# Patient Record
Sex: Female | Born: 1935 | Race: Black or African American | Hispanic: No | Smoking: Former smoker
Health system: Southern US, Community
[De-identification: ages and names within clinical notes are randomized; demographics above are authoritative.]

## PROBLEM LIST (undated history)

## (undated) DIAGNOSIS — I10 Essential (primary) hypertension: Secondary | ICD-10-CM

## (undated) DIAGNOSIS — K219 Gastro-esophageal reflux disease without esophagitis: Secondary | ICD-10-CM

## (undated) DIAGNOSIS — D649 Anemia, unspecified: Secondary | ICD-10-CM

## (undated) DIAGNOSIS — G8929 Other chronic pain: Secondary | ICD-10-CM

## (undated) DIAGNOSIS — E079 Disorder of thyroid, unspecified: Secondary | ICD-10-CM

## (undated) DIAGNOSIS — R51 Headache: Secondary | ICD-10-CM

## (undated) DIAGNOSIS — I2510 Atherosclerotic heart disease of native coronary artery without angina pectoris: Secondary | ICD-10-CM

## (undated) DIAGNOSIS — M549 Dorsalgia, unspecified: Secondary | ICD-10-CM

## (undated) DIAGNOSIS — I809 Phlebitis and thrombophlebitis of unspecified site: Secondary | ICD-10-CM

## (undated) DIAGNOSIS — R0602 Shortness of breath: Secondary | ICD-10-CM

## (undated) DIAGNOSIS — M1990 Unspecified osteoarthritis, unspecified site: Secondary | ICD-10-CM

## (undated) DIAGNOSIS — G5602 Carpal tunnel syndrome, left upper limb: Secondary | ICD-10-CM

## (undated) DIAGNOSIS — E785 Hyperlipidemia, unspecified: Secondary | ICD-10-CM

## (undated) DIAGNOSIS — R197 Diarrhea, unspecified: Secondary | ICD-10-CM

## (undated) DIAGNOSIS — J189 Pneumonia, unspecified organism: Secondary | ICD-10-CM

## (undated) DIAGNOSIS — G4730 Sleep apnea, unspecified: Secondary | ICD-10-CM

## (undated) DIAGNOSIS — R351 Nocturia: Secondary | ICD-10-CM

## (undated) DIAGNOSIS — E669 Obesity, unspecified: Secondary | ICD-10-CM

## (undated) HISTORY — PX: COLONOSCOPY: SHX174

## (undated) HISTORY — PX: BACK SURGERY: SHX140

## (undated) HISTORY — DX: Atherosclerotic heart disease of native coronary artery without angina pectoris: I25.10

## (undated) HISTORY — PX: SPINE SURGERY: SHX786

## (undated) HISTORY — DX: Other chronic pain: M54.9

## (undated) HISTORY — DX: Hyperlipidemia, unspecified: E78.5

## (undated) HISTORY — DX: Carpal tunnel syndrome, left upper limb: G56.02

## (undated) HISTORY — PX: ABDOMINAL HYSTERECTOMY: SHX81

## (undated) HISTORY — DX: Other chronic pain: G89.29

## (undated) HISTORY — DX: Unspecified osteoarthritis, unspecified site: M19.90

## (undated) HISTORY — PX: CHOLECYSTECTOMY: SHX55

## (undated) HISTORY — PX: ESOPHAGOGASTRODUODENOSCOPY: SHX1529

## (undated) HISTORY — DX: Essential (primary) hypertension: I10

## (undated) HISTORY — DX: Obesity, unspecified: E66.9

## (undated) HISTORY — PX: ROTATOR CUFF REPAIR: SHX139

## (undated) HISTORY — PX: TONSILLECTOMY: SUR1361

---

## 1970-09-06 HISTORY — PX: PARTIAL HYSTERECTOMY: SHX80

## 1985-09-06 HISTORY — PX: APPENDECTOMY: SHX54

## 1990-09-06 HISTORY — PX: OTHER SURGICAL HISTORY: SHX169

## 1997-09-06 HISTORY — PX: OTHER SURGICAL HISTORY: SHX169

## 2003-08-19 ENCOUNTER — Ambulatory Visit (HOSPITAL_COMMUNITY): Admission: RE | Admit: 2003-08-19 | Discharge: 2003-08-19 | Payer: Self-pay | Admitting: Family Medicine

## 2003-08-22 ENCOUNTER — Ambulatory Visit (HOSPITAL_COMMUNITY): Admission: RE | Admit: 2003-08-22 | Discharge: 2003-08-22 | Payer: Self-pay | Admitting: Family Medicine

## 2003-09-03 ENCOUNTER — Ambulatory Visit (HOSPITAL_COMMUNITY): Admission: RE | Admit: 2003-09-03 | Discharge: 2003-09-03 | Payer: Self-pay | Admitting: Family Medicine

## 2003-09-27 ENCOUNTER — Ambulatory Visit (HOSPITAL_COMMUNITY): Admission: RE | Admit: 2003-09-27 | Discharge: 2003-09-27 | Payer: Self-pay | Admitting: General Surgery

## 2003-12-29 ENCOUNTER — Emergency Department (HOSPITAL_COMMUNITY): Admission: EM | Admit: 2003-12-29 | Discharge: 2003-12-29 | Payer: Self-pay | Admitting: Emergency Medicine

## 2004-04-07 ENCOUNTER — Ambulatory Visit (HOSPITAL_COMMUNITY): Admission: RE | Admit: 2004-04-07 | Discharge: 2004-04-07 | Payer: Self-pay | Admitting: Family Medicine

## 2004-05-20 ENCOUNTER — Encounter
Admission: RE | Admit: 2004-05-20 | Discharge: 2004-08-18 | Payer: Self-pay | Admitting: Physical Medicine & Rehabilitation

## 2004-05-21 ENCOUNTER — Ambulatory Visit: Payer: Self-pay | Admitting: Physical Medicine & Rehabilitation

## 2004-08-17 ENCOUNTER — Ambulatory Visit: Payer: Self-pay | Admitting: Physical Medicine & Rehabilitation

## 2004-08-21 ENCOUNTER — Ambulatory Visit (HOSPITAL_COMMUNITY): Admission: RE | Admit: 2004-08-21 | Discharge: 2004-08-21 | Payer: Self-pay | Admitting: Family Medicine

## 2004-11-03 ENCOUNTER — Ambulatory Visit: Payer: Self-pay | Admitting: Family Medicine

## 2005-01-21 ENCOUNTER — Ambulatory Visit: Payer: Self-pay | Admitting: Family Medicine

## 2005-01-28 ENCOUNTER — Ambulatory Visit: Payer: Self-pay | Admitting: Family Medicine

## 2005-06-08 ENCOUNTER — Ambulatory Visit: Payer: Self-pay | Admitting: Family Medicine

## 2005-08-23 ENCOUNTER — Ambulatory Visit (HOSPITAL_COMMUNITY): Admission: RE | Admit: 2005-08-23 | Discharge: 2005-08-23 | Payer: Self-pay | Admitting: Family Medicine

## 2005-10-13 ENCOUNTER — Ambulatory Visit: Payer: Self-pay | Admitting: Family Medicine

## 2005-11-01 ENCOUNTER — Encounter: Admission: RE | Admit: 2005-11-01 | Discharge: 2005-11-01 | Payer: Self-pay | Admitting: Family Medicine

## 2005-11-10 ENCOUNTER — Ambulatory Visit: Payer: Self-pay | Admitting: Family Medicine

## 2005-12-05 LAB — HM COLONOSCOPY: HM Colonoscopy: NORMAL

## 2005-12-21 ENCOUNTER — Ambulatory Visit (HOSPITAL_COMMUNITY): Admission: RE | Admit: 2005-12-21 | Discharge: 2005-12-21 | Payer: Self-pay | Admitting: General Surgery

## 2006-01-05 ENCOUNTER — Encounter (INDEPENDENT_AMBULATORY_CARE_PROVIDER_SITE_OTHER): Payer: Self-pay | Admitting: *Deleted

## 2006-01-05 ENCOUNTER — Encounter: Payer: Self-pay | Admitting: Family Medicine

## 2006-01-05 ENCOUNTER — Other Ambulatory Visit: Admission: RE | Admit: 2006-01-05 | Discharge: 2006-01-05 | Payer: Self-pay | Admitting: Family Medicine

## 2006-01-05 ENCOUNTER — Ambulatory Visit: Payer: Self-pay | Admitting: Family Medicine

## 2006-01-05 LAB — CONVERTED CEMR LAB: Pap Smear: NORMAL

## 2006-05-23 ENCOUNTER — Ambulatory Visit: Payer: Self-pay | Admitting: Family Medicine

## 2006-06-17 ENCOUNTER — Ambulatory Visit: Payer: Self-pay | Admitting: Family Medicine

## 2006-07-26 ENCOUNTER — Ambulatory Visit: Payer: Self-pay | Admitting: Family Medicine

## 2006-08-25 ENCOUNTER — Ambulatory Visit (HOSPITAL_COMMUNITY): Admission: RE | Admit: 2006-08-25 | Discharge: 2006-08-25 | Payer: Self-pay | Admitting: Family Medicine

## 2006-09-12 ENCOUNTER — Ambulatory Visit: Payer: Self-pay | Admitting: Family Medicine

## 2006-10-11 ENCOUNTER — Emergency Department (HOSPITAL_COMMUNITY): Admission: EM | Admit: 2006-10-11 | Discharge: 2006-10-11 | Payer: Self-pay | Admitting: Emergency Medicine

## 2006-11-07 ENCOUNTER — Ambulatory Visit (HOSPITAL_COMMUNITY): Admission: RE | Admit: 2006-11-07 | Discharge: 2006-11-07 | Payer: Self-pay | Admitting: *Deleted

## 2006-11-28 ENCOUNTER — Ambulatory Visit (HOSPITAL_COMMUNITY): Admission: RE | Admit: 2006-11-28 | Discharge: 2006-11-28 | Payer: Self-pay | Admitting: Family Medicine

## 2006-12-26 ENCOUNTER — Ambulatory Visit: Payer: Self-pay | Admitting: Family Medicine

## 2007-02-09 ENCOUNTER — Encounter: Admission: RE | Admit: 2007-02-09 | Discharge: 2007-02-09 | Payer: Self-pay | Admitting: Family Medicine

## 2007-02-14 ENCOUNTER — Ambulatory Visit (HOSPITAL_COMMUNITY): Admission: RE | Admit: 2007-02-14 | Discharge: 2007-02-14 | Payer: Self-pay | Admitting: *Deleted

## 2007-03-24 ENCOUNTER — Encounter: Payer: Self-pay | Admitting: Family Medicine

## 2007-03-24 LAB — CONVERTED CEMR LAB
ALT: 12 units/L (ref 0–35)
AST: 20 units/L (ref 0–37)
Albumin: 4.2 g/dL (ref 3.5–5.2)
Alkaline Phosphatase: 54 units/L (ref 39–117)
BUN: 28 mg/dL — ABNORMAL HIGH (ref 6–23)
Basophils Absolute: 0 10*3/uL (ref 0.0–0.1)
Basophils Relative: 1 % (ref 0–1)
Bilirubin, Direct: 0.1 mg/dL (ref 0.0–0.3)
CO2: 30 meq/L (ref 19–32)
Calcium: 9.4 mg/dL (ref 8.4–10.5)
Chloride: 104 meq/L (ref 96–112)
Cholesterol: 141 mg/dL (ref 0–200)
Creatinine, Ser: 1.17 mg/dL (ref 0.40–1.20)
Eosinophils Absolute: 0.2 10*3/uL (ref 0.0–0.7)
Eosinophils Relative: 5 % (ref 0–5)
Glucose, Bld: 88 mg/dL (ref 70–99)
HCT: 37.6 % (ref 36.0–46.0)
HDL: 45 mg/dL (ref >39)
Hemoglobin: 11.8 g/dL — ABNORMAL LOW (ref 12.0–15.0)
Indirect Bilirubin: 0.3 mg/dL (ref 0.0–0.9)
LDL Cholesterol: 77 mg/dL (ref 0–99)
Lymphocytes Relative: 48 % — ABNORMAL HIGH (ref 12–46)
Lymphs Abs: 2 10*3/uL (ref 0.7–3.3)
MCHC: 31.4 g/dL (ref 30.0–36.0)
MCV: 90.6 fL (ref 78.0–100.0)
Monocytes Absolute: 0.4 10*3/uL (ref 0.2–0.7)
Monocytes Relative: 10 % (ref 3–11)
Neutro Abs: 1.6 10*3/uL — ABNORMAL LOW (ref 1.7–7.7)
Neutrophils Relative %: 36 % — ABNORMAL LOW (ref 43–77)
Platelets: 261 10*3/uL (ref 150–400)
Potassium: 4.5 meq/L (ref 3.5–5.3)
RBC: 4.15 M/uL (ref 3.87–5.11)
RDW: 13.8 % (ref 11.5–14.0)
Sodium: 141 meq/L (ref 135–145)
Total Bilirubin: 0.4 mg/dL (ref 0.3–1.2)
Total CHOL/HDL Ratio: 3.1
Total Protein: 6.8 g/dL (ref 6.0–8.3)
Triglycerides: 96 mg/dL (ref <150)
VLDL: 19 mg/dL (ref 0–40)
WBC: 4.3 10*3/uL (ref 4.0–10.5)

## 2007-03-27 ENCOUNTER — Ambulatory Visit: Payer: Self-pay | Admitting: Family Medicine

## 2007-03-29 ENCOUNTER — Ambulatory Visit (HOSPITAL_COMMUNITY): Admission: RE | Admit: 2007-03-29 | Discharge: 2007-03-29 | Payer: Self-pay | Admitting: Family Medicine

## 2007-05-26 ENCOUNTER — Ambulatory Visit: Payer: Self-pay | Admitting: Family Medicine

## 2007-07-21 ENCOUNTER — Encounter: Admission: RE | Admit: 2007-07-21 | Discharge: 2007-07-21 | Payer: Self-pay | Admitting: Family Medicine

## 2007-07-21 ENCOUNTER — Ambulatory Visit: Payer: Self-pay | Admitting: Family Medicine

## 2007-08-19 ENCOUNTER — Encounter (INDEPENDENT_AMBULATORY_CARE_PROVIDER_SITE_OTHER): Payer: Self-pay | Admitting: *Deleted

## 2007-08-24 ENCOUNTER — Encounter: Payer: Self-pay | Admitting: Family Medicine

## 2007-08-24 LAB — CONVERTED CEMR LAB
ALT: 15 units/L (ref 0–35)
AST: 25 units/L (ref 0–37)
Albumin: 4.3 g/dL (ref 3.5–5.2)
Alkaline Phosphatase: 54 units/L (ref 39–117)
BUN: 26 mg/dL — ABNORMAL HIGH (ref 6–23)
Bilirubin, Direct: 0.1 mg/dL (ref 0.0–0.3)
CO2: 27 meq/L (ref 19–32)
Calcium: 9.9 mg/dL (ref 8.4–10.5)
Chloride: 101 meq/L (ref 96–112)
Cholesterol: 139 mg/dL (ref 0–200)
Creatinine, Ser: 0.96 mg/dL (ref 0.40–1.20)
Glucose, Bld: 88 mg/dL (ref 70–99)
HDL: 46 mg/dL (ref >39)
Indirect Bilirubin: 0.6 mg/dL (ref 0.0–0.9)
LDL Cholesterol: 68 mg/dL (ref 0–99)
Potassium: 4.3 meq/L (ref 3.5–5.3)
Sodium: 142 meq/L (ref 135–145)
Total Bilirubin: 0.7 mg/dL (ref 0.3–1.2)
Total CHOL/HDL Ratio: 3
Total Protein: 7.1 g/dL (ref 6.0–8.3)
Triglycerides: 127 mg/dL (ref <150)
VLDL: 25 mg/dL (ref 0–40)

## 2007-08-28 ENCOUNTER — Ambulatory Visit (HOSPITAL_COMMUNITY): Admission: RE | Admit: 2007-08-28 | Discharge: 2007-08-28 | Payer: Self-pay | Admitting: Family Medicine

## 2007-11-20 ENCOUNTER — Ambulatory Visit: Payer: Self-pay | Admitting: Family Medicine

## 2007-11-24 DIAGNOSIS — I10 Essential (primary) hypertension: Secondary | ICD-10-CM | POA: Insufficient documentation

## 2007-11-24 DIAGNOSIS — E785 Hyperlipidemia, unspecified: Secondary | ICD-10-CM | POA: Insufficient documentation

## 2007-12-01 ENCOUNTER — Encounter: Admission: RE | Admit: 2007-12-01 | Discharge: 2007-12-01 | Payer: Self-pay | Admitting: Family Medicine

## 2007-12-29 ENCOUNTER — Encounter: Payer: Self-pay | Admitting: Family Medicine

## 2007-12-29 LAB — CONVERTED CEMR LAB
ALT: 18 units/L (ref 0–35)
AST: 31 units/L (ref 0–37)
Albumin: 4.1 g/dL (ref 3.5–5.2)
Alkaline Phosphatase: 55 units/L (ref 39–117)
BUN: 25 mg/dL — ABNORMAL HIGH (ref 6–23)
Bilirubin, Direct: 0.1 mg/dL (ref 0.0–0.3)
CO2: 27 meq/L (ref 19–32)
Calcium: 9.7 mg/dL (ref 8.4–10.5)
Chloride: 103 meq/L (ref 96–112)
Cholesterol: 126 mg/dL (ref 0–200)
Creatinine, Ser: 1.21 mg/dL — ABNORMAL HIGH (ref 0.40–1.20)
Glucose, Bld: 85 mg/dL (ref 70–99)
HDL: 39 mg/dL — ABNORMAL LOW (ref >39)
Indirect Bilirubin: 0.3 mg/dL (ref 0.0–0.9)
LDL Cholesterol: 58 mg/dL (ref 0–99)
Potassium: 4.2 meq/L (ref 3.5–5.3)
Sodium: 141 meq/L (ref 135–145)
Total Bilirubin: 0.4 mg/dL (ref 0.3–1.2)
Total CHOL/HDL Ratio: 3.2
Total Protein: 7 g/dL (ref 6.0–8.3)
Triglycerides: 147 mg/dL (ref <150)
VLDL: 29 mg/dL (ref 0–40)

## 2008-01-04 ENCOUNTER — Encounter: Admission: RE | Admit: 2008-01-04 | Discharge: 2008-01-04 | Payer: Self-pay | Admitting: Family Medicine

## 2008-02-15 ENCOUNTER — Ambulatory Visit: Payer: Self-pay | Admitting: Family Medicine

## 2008-04-03 ENCOUNTER — Ambulatory Visit: Payer: Self-pay | Admitting: Family Medicine

## 2008-04-04 ENCOUNTER — Telehealth: Payer: Self-pay | Admitting: Family Medicine

## 2008-05-14 ENCOUNTER — Encounter: Payer: Self-pay | Admitting: Family Medicine

## 2008-05-23 ENCOUNTER — Encounter: Payer: Self-pay | Admitting: Family Medicine

## 2008-05-23 ENCOUNTER — Telehealth: Payer: Self-pay | Admitting: Family Medicine

## 2008-05-30 ENCOUNTER — Encounter: Admission: RE | Admit: 2008-05-30 | Discharge: 2008-05-30 | Payer: Self-pay | Admitting: Radiology

## 2008-06-24 ENCOUNTER — Encounter: Payer: Self-pay | Admitting: Family Medicine

## 2008-06-24 ENCOUNTER — Telehealth: Payer: Self-pay | Admitting: Family Medicine

## 2008-06-28 ENCOUNTER — Encounter: Payer: Self-pay | Admitting: Family Medicine

## 2008-07-01 ENCOUNTER — Encounter: Payer: Self-pay | Admitting: Family Medicine

## 2008-07-01 LAB — CONVERTED CEMR LAB
ALT: 19 units/L (ref 0–35)
AST: 27 units/L (ref 0–37)
Albumin: 4.1 g/dL (ref 3.5–5.2)
Alkaline Phosphatase: 61 units/L (ref 39–117)
BUN: 24 mg/dL — ABNORMAL HIGH (ref 6–23)
Basophils Absolute: 0 10*3/uL (ref 0.0–0.1)
Basophils Relative: 0 % (ref 0–1)
Bilirubin, Direct: 0.1 mg/dL (ref 0.0–0.3)
CO2: 25 meq/L (ref 19–32)
Calcium: 9.4 mg/dL (ref 8.4–10.5)
Chloride: 104 meq/L (ref 96–112)
Cholesterol: 132 mg/dL (ref 0–200)
Creatinine, Ser: 1.1 mg/dL (ref 0.40–1.20)
Eosinophils Absolute: 0.3 10*3/uL (ref 0.0–0.7)
Eosinophils Relative: 6 % — ABNORMAL HIGH (ref 0–5)
Glucose, Bld: 81 mg/dL (ref 70–99)
HCT: 35.6 % — ABNORMAL LOW (ref 36.0–46.0)
HDL: 57 mg/dL (ref >39)
Hemoglobin: 10.7 g/dL — ABNORMAL LOW (ref 12.0–15.0)
Indirect Bilirubin: 0.4 mg/dL (ref 0.0–0.9)
LDL Cholesterol: 62 mg/dL (ref 0–99)
Lymphocytes Relative: 53 % — ABNORMAL HIGH (ref 12–46)
Lymphs Abs: 2.5 10*3/uL (ref 0.7–4.0)
MCHC: 30.1 g/dL (ref 30.0–36.0)
MCV: 93 fL (ref 78.0–100.0)
Monocytes Absolute: 0.3 10*3/uL (ref 0.1–1.0)
Monocytes Relative: 7 % (ref 3–12)
Neutro Abs: 1.6 10*3/uL — ABNORMAL LOW (ref 1.7–7.7)
Neutrophils Relative %: 34 % — ABNORMAL LOW (ref 43–77)
Platelets: 225 10*3/uL (ref 150–400)
Potassium: 4.4 meq/L (ref 3.5–5.3)
RBC: 3.83 M/uL — ABNORMAL LOW (ref 3.87–5.11)
RDW: 14.1 % (ref 11.5–15.5)
Retic Ct Pct: 0.7 % (ref 0.4–3.1)
Sodium: 140 meq/L (ref 135–145)
Total Bilirubin: 0.5 mg/dL (ref 0.3–1.2)
Total CHOL/HDL Ratio: 2.3
Total Protein: 6.7 g/dL (ref 6.0–8.3)
Triglycerides: 63 mg/dL (ref <150)
VLDL: 13 mg/dL (ref 0–40)
WBC: 4.7 10*3/uL (ref 4.0–10.5)

## 2008-07-03 ENCOUNTER — Ambulatory Visit: Payer: Self-pay | Admitting: Family Medicine

## 2008-07-03 DIAGNOSIS — M5440 Lumbago with sciatica, unspecified side: Secondary | ICD-10-CM | POA: Insufficient documentation

## 2008-07-03 LAB — CONVERTED CEMR LAB
Bilirubin Urine: NEGATIVE
Glucose, Bld: 106 mg/dL
Glucose, Urine, Semiquant: NEGATIVE
Hgb A1c MFr Bld: 6.2 %
Ketones, urine, test strip: NEGATIVE
Nitrite: NEGATIVE
Protein, U semiquant: NEGATIVE
Specific Gravity, Urine: 1.015
Urobilinogen, UA: 0.2
WBC Urine, dipstick: NEGATIVE
pH: 5

## 2008-07-04 ENCOUNTER — Encounter: Payer: Self-pay | Admitting: Family Medicine

## 2008-07-04 LAB — CONVERTED CEMR LAB
Bilirubin Urine: NEGATIVE
Hemoglobin, Urine: NEGATIVE
Ketones, ur: NEGATIVE mg/dL
Leukocytes, UA: NEGATIVE
Nitrite: NEGATIVE
Protein, ur: NEGATIVE mg/dL
Specific Gravity, Urine: 1.02 (ref 1.005–1.03)
Urine Glucose: NEGATIVE mg/dL
Urobilinogen, UA: 0.2 (ref 0.0–1.0)
pH: 5.5 (ref 5.0–8.0)

## 2008-07-05 LAB — CONVERTED CEMR LAB: Helicobacter Pylori Antibody-IgG: >8 — ABNORMAL HIGH

## 2008-07-07 DIAGNOSIS — K3189 Other diseases of stomach and duodenum: Secondary | ICD-10-CM | POA: Insufficient documentation

## 2008-07-07 DIAGNOSIS — R1013 Epigastric pain: Secondary | ICD-10-CM

## 2008-07-24 ENCOUNTER — Telehealth: Payer: Self-pay | Admitting: Family Medicine

## 2008-07-25 ENCOUNTER — Encounter: Payer: Self-pay | Admitting: Family Medicine

## 2008-08-21 ENCOUNTER — Encounter: Payer: Self-pay | Admitting: Family Medicine

## 2008-08-22 ENCOUNTER — Encounter: Payer: Self-pay | Admitting: Family Medicine

## 2008-08-22 ENCOUNTER — Telehealth: Payer: Self-pay | Admitting: Family Medicine

## 2008-08-28 ENCOUNTER — Ambulatory Visit (HOSPITAL_COMMUNITY): Admission: RE | Admit: 2008-08-28 | Discharge: 2008-08-28 | Payer: Self-pay | Admitting: Family Medicine

## 2008-09-25 ENCOUNTER — Encounter: Payer: Self-pay | Admitting: Family Medicine

## 2008-09-27 LAB — CONVERTED CEMR LAB: Retic Ct Pct: 1.4 % (ref 0.4–3.1)

## 2008-10-01 ENCOUNTER — Ambulatory Visit: Payer: Self-pay | Admitting: Family Medicine

## 2008-10-01 DIAGNOSIS — I2510 Atherosclerotic heart disease of native coronary artery without angina pectoris: Secondary | ICD-10-CM | POA: Insufficient documentation

## 2008-10-02 ENCOUNTER — Telehealth: Payer: Self-pay | Admitting: Family Medicine

## 2008-10-03 ENCOUNTER — Encounter: Payer: Self-pay | Admitting: Family Medicine

## 2008-10-31 ENCOUNTER — Telehealth: Payer: Self-pay | Admitting: Family Medicine

## 2008-10-31 ENCOUNTER — Encounter: Payer: Self-pay | Admitting: Family Medicine

## 2008-11-04 ENCOUNTER — Encounter: Payer: Self-pay | Admitting: Family Medicine

## 2008-11-12 ENCOUNTER — Encounter: Payer: Self-pay | Admitting: Family Medicine

## 2008-11-12 HISTORY — PX: NM MYOCAR PERF WALL MOTION: HXRAD629

## 2008-12-18 ENCOUNTER — Encounter: Payer: Self-pay | Admitting: Family Medicine

## 2008-12-20 LAB — CONVERTED CEMR LAB
ALT: 13 units/L (ref 0–35)
AST: 22 units/L (ref 0–37)
Albumin: 4 g/dL (ref 3.5–5.2)
Alkaline Phosphatase: 52 units/L (ref 39–117)
BUN: 32 mg/dL — ABNORMAL HIGH (ref 6–23)
Basophils Absolute: 0 10*3/uL (ref 0.0–0.1)
Basophils Relative: 1 % (ref 0–1)
Bilirubin, Direct: 0.1 mg/dL (ref 0.0–0.3)
CO2: 27 meq/L (ref 19–32)
Calcium: 9.6 mg/dL (ref 8.4–10.5)
Chloride: 102 meq/L (ref 96–112)
Cholesterol: 133 mg/dL (ref 0–200)
Creatinine, Ser: 1.2 mg/dL (ref 0.40–1.20)
Eosinophils Absolute: 0.2 10*3/uL (ref 0.0–0.7)
Eosinophils Relative: 5 % (ref 0–5)
Glucose, Bld: 80 mg/dL (ref 70–99)
HCT: 35.2 % — ABNORMAL LOW (ref 36.0–46.0)
HDL: 41 mg/dL (ref >39)
Hemoglobin: 11.2 g/dL — ABNORMAL LOW (ref 12.0–15.0)
Indirect Bilirubin: 0.3 mg/dL (ref 0.0–0.9)
LDL Cholesterol: 65 mg/dL (ref 0–99)
Lymphocytes Relative: 52 % — ABNORMAL HIGH (ref 12–46)
Lymphs Abs: 2.3 10*3/uL (ref 0.7–4.0)
MCHC: 31.8 g/dL (ref 30.0–36.0)
MCV: 88.9 fL (ref 78.0–100.0)
Monocytes Absolute: 0.4 10*3/uL (ref 0.1–1.0)
Monocytes Relative: 8 % (ref 3–12)
Neutro Abs: 1.5 10*3/uL — ABNORMAL LOW (ref 1.7–7.7)
Neutrophils Relative %: 34 % — ABNORMAL LOW (ref 43–77)
Platelets: 225 10*3/uL (ref 150–400)
Potassium: 4.2 meq/L (ref 3.5–5.3)
RBC: 3.96 M/uL (ref 3.87–5.11)
RDW: 13.5 % (ref 11.5–15.5)
Sodium: 141 meq/L (ref 135–145)
Total Bilirubin: 0.4 mg/dL (ref 0.3–1.2)
Total CHOL/HDL Ratio: 3.2
Total Protein: 6.9 g/dL (ref 6.0–8.3)
Triglycerides: 136 mg/dL (ref <150)
VLDL: 27 mg/dL (ref 0–40)
WBC: 4.4 10*3/uL (ref 4.0–10.5)

## 2008-12-30 ENCOUNTER — Encounter: Payer: Self-pay | Admitting: Family Medicine

## 2008-12-30 ENCOUNTER — Telehealth: Payer: Self-pay | Admitting: Family Medicine

## 2008-12-31 ENCOUNTER — Encounter: Payer: Self-pay | Admitting: Family Medicine

## 2009-02-06 ENCOUNTER — Encounter: Payer: Self-pay | Admitting: Family Medicine

## 2009-02-06 ENCOUNTER — Telehealth: Payer: Self-pay | Admitting: Family Medicine

## 2009-03-19 ENCOUNTER — Telehealth: Payer: Self-pay | Admitting: Family Medicine

## 2009-03-31 ENCOUNTER — Encounter: Admission: RE | Admit: 2009-03-31 | Discharge: 2009-03-31 | Payer: Self-pay | Admitting: Family Medicine

## 2009-03-31 ENCOUNTER — Telehealth: Payer: Self-pay | Admitting: Family Medicine

## 2009-04-30 ENCOUNTER — Telehealth: Payer: Self-pay | Admitting: Family Medicine

## 2009-05-01 ENCOUNTER — Encounter: Payer: Self-pay | Admitting: Family Medicine

## 2009-05-02 ENCOUNTER — Encounter: Payer: Self-pay | Admitting: Family Medicine

## 2009-05-28 ENCOUNTER — Encounter: Payer: Self-pay | Admitting: Family Medicine

## 2009-05-29 ENCOUNTER — Telehealth: Payer: Self-pay | Admitting: Family Medicine

## 2009-05-29 DIAGNOSIS — R5381 Other malaise: Secondary | ICD-10-CM | POA: Insufficient documentation

## 2009-05-29 DIAGNOSIS — R5383 Other fatigue: Secondary | ICD-10-CM

## 2009-06-09 LAB — CONVERTED CEMR LAB: Retic Ct Pct: 1.3 % (ref 0.4–3.1)

## 2009-06-12 ENCOUNTER — Ambulatory Visit: Payer: Self-pay | Admitting: Family Medicine

## 2009-06-17 LAB — CONVERTED CEMR LAB: OCCULT 1: NEGATIVE

## 2009-06-18 LAB — CONVERTED CEMR LAB: OCCULT 3: NEGATIVE

## 2009-06-19 LAB — CONVERTED CEMR LAB: OCCULT 2: NEGATIVE

## 2009-06-23 ENCOUNTER — Encounter: Payer: Self-pay | Admitting: Family Medicine

## 2009-06-25 ENCOUNTER — Encounter: Payer: Self-pay | Admitting: Family Medicine

## 2009-06-25 ENCOUNTER — Telehealth: Payer: Self-pay | Admitting: Family Medicine

## 2009-06-27 ENCOUNTER — Encounter: Payer: Self-pay | Admitting: Family Medicine

## 2009-06-27 ENCOUNTER — Encounter (INDEPENDENT_AMBULATORY_CARE_PROVIDER_SITE_OTHER): Payer: Self-pay | Admitting: *Deleted

## 2009-06-27 LAB — CONVERTED CEMR LAB: Helicobacter Pylori Antibody-IgG: 2.6 — ABNORMAL HIGH

## 2009-07-03 ENCOUNTER — Telehealth: Payer: Self-pay | Admitting: Family Medicine

## 2009-07-07 ENCOUNTER — Ambulatory Visit: Payer: Self-pay | Admitting: Family Medicine

## 2009-07-07 LAB — CONVERTED CEMR LAB: OCCULT 1: NEGATIVE

## 2009-07-09 ENCOUNTER — Ambulatory Visit: Payer: Self-pay | Admitting: Internal Medicine

## 2009-07-10 ENCOUNTER — Encounter: Payer: Self-pay | Admitting: Internal Medicine

## 2009-07-14 ENCOUNTER — Ambulatory Visit (HOSPITAL_COMMUNITY): Admission: RE | Admit: 2009-07-14 | Discharge: 2009-07-14 | Payer: Self-pay | Admitting: Internal Medicine

## 2009-07-14 ENCOUNTER — Ambulatory Visit: Payer: Self-pay | Admitting: Internal Medicine

## 2009-07-14 ENCOUNTER — Encounter: Payer: Self-pay | Admitting: Family Medicine

## 2009-07-14 ENCOUNTER — Encounter: Payer: Self-pay | Admitting: Gastroenterology

## 2009-07-17 ENCOUNTER — Ambulatory Visit (HOSPITAL_COMMUNITY): Admission: RE | Admit: 2009-07-17 | Discharge: 2009-07-17 | Payer: Self-pay | Admitting: Internal Medicine

## 2009-08-04 ENCOUNTER — Encounter: Payer: Self-pay | Admitting: Family Medicine

## 2009-08-08 LAB — CONVERTED CEMR LAB: Retic Ct Pct: 1.4 % (ref 0.4–3.1)

## 2009-08-12 ENCOUNTER — Encounter: Payer: Self-pay | Admitting: Family Medicine

## 2009-09-09 ENCOUNTER — Ambulatory Visit (HOSPITAL_COMMUNITY): Admission: RE | Admit: 2009-09-09 | Discharge: 2009-09-09 | Payer: Self-pay | Admitting: Family Medicine

## 2009-09-10 ENCOUNTER — Telehealth: Payer: Self-pay | Admitting: Family Medicine

## 2009-09-10 ENCOUNTER — Ambulatory Visit: Payer: Self-pay | Admitting: Internal Medicine

## 2009-09-15 ENCOUNTER — Encounter: Payer: Self-pay | Admitting: Family Medicine

## 2009-09-15 ENCOUNTER — Ambulatory Visit: Payer: Self-pay | Admitting: Internal Medicine

## 2009-09-15 ENCOUNTER — Telehealth: Payer: Self-pay | Admitting: Family Medicine

## 2009-09-15 DIAGNOSIS — K219 Gastro-esophageal reflux disease without esophagitis: Secondary | ICD-10-CM | POA: Insufficient documentation

## 2009-09-18 ENCOUNTER — Encounter: Payer: Self-pay | Admitting: Internal Medicine

## 2009-09-19 LAB — CONVERTED CEMR LAB
BUN: 27 mg/dL — ABNORMAL HIGH (ref 6–23)
CO2: 25 meq/L (ref 19–32)
Calcium: 9 mg/dL (ref 8.4–10.5)
Chloride: 99 meq/L (ref 96–112)
Creatinine, Ser: 1.15 mg/dL (ref 0.40–1.20)
Glucose, Bld: 84 mg/dL (ref 70–99)
Potassium: 4 meq/L (ref 3.5–5.3)
Sodium: 140 meq/L (ref 135–145)
Vit D, 25-Hydroxy: 57 ng/mL (ref 30–89)

## 2009-09-22 ENCOUNTER — Telehealth: Payer: Self-pay | Admitting: Family Medicine

## 2009-10-13 ENCOUNTER — Encounter: Payer: Self-pay | Admitting: Internal Medicine

## 2009-10-15 ENCOUNTER — Encounter: Payer: Self-pay | Admitting: Family Medicine

## 2009-10-16 ENCOUNTER — Encounter: Payer: Self-pay | Admitting: Family Medicine

## 2009-11-20 ENCOUNTER — Encounter: Admission: RE | Admit: 2009-11-20 | Discharge: 2009-11-20 | Payer: Self-pay | Admitting: Family Medicine

## 2009-12-01 ENCOUNTER — Ambulatory Visit: Payer: Self-pay | Admitting: Family Medicine

## 2009-12-01 LAB — CONVERTED CEMR LAB
ALT: 19 units/L (ref 0–35)
AST: 29 units/L (ref 0–37)
Albumin: 4.4 g/dL (ref 3.5–5.2)
Alkaline Phosphatase: 50 units/L (ref 39–117)
BUN: 33 mg/dL — ABNORMAL HIGH (ref 6–23)
Basophils Absolute: 0 10*3/uL (ref 0.0–0.1)
Basophils Relative: 0 % (ref 0–1)
Bilirubin, Direct: 0.1 mg/dL (ref 0.0–0.3)
CO2: 26 meq/L (ref 19–32)
Calcium: 10.1 mg/dL (ref 8.4–10.5)
Chloride: 101 meq/L (ref 96–112)
Cholesterol: 144 mg/dL (ref 0–200)
Creatinine, Ser: 1.26 mg/dL — ABNORMAL HIGH (ref 0.40–1.20)
Eosinophils Absolute: 0.1 10*3/uL (ref 0.0–0.7)
Eosinophils Relative: 3 % (ref 0–5)
Glucose, Bld: 95 mg/dL (ref 70–99)
HCT: 34.8 % — ABNORMAL LOW (ref 36.0–46.0)
HDL: 53 mg/dL (ref >39)
Hemoglobin: 10.8 g/dL — ABNORMAL LOW (ref 12.0–15.0)
Indirect Bilirubin: 0.3 mg/dL (ref 0.0–0.9)
LDL Cholesterol: 74 mg/dL (ref 0–99)
Lymphocytes Relative: 50 % — ABNORMAL HIGH (ref 12–46)
Lymphs Abs: 2.6 10*3/uL (ref 0.7–4.0)
MCHC: 31 g/dL (ref 30.0–36.0)
MCV: 89.5 fL (ref 78.0–100.0)
Monocytes Absolute: 0.5 10*3/uL (ref 0.1–1.0)
Monocytes Relative: 9 % (ref 3–12)
Neutro Abs: 2 10*3/uL (ref 1.7–7.7)
Neutrophils Relative %: 38 % — ABNORMAL LOW (ref 43–77)
Platelets: 257 10*3/uL (ref 150–400)
Potassium: 4 meq/L (ref 3.5–5.3)
RBC: 3.89 M/uL (ref 3.87–5.11)
RDW: 13.8 % (ref 11.5–15.5)
Sodium: 141 meq/L (ref 135–145)
Total Bilirubin: 0.4 mg/dL (ref 0.3–1.2)
Total CHOL/HDL Ratio: 2.7
Total Protein: 7 g/dL (ref 6.0–8.3)
Triglycerides: 83 mg/dL (ref <150)
VLDL: 17 mg/dL (ref 0–40)
WBC: 5.2 10*3/uL (ref 4.0–10.5)

## 2009-12-25 ENCOUNTER — Encounter: Payer: Self-pay | Admitting: Family Medicine

## 2010-01-09 ENCOUNTER — Telehealth: Payer: Self-pay | Admitting: Family Medicine

## 2010-01-27 ENCOUNTER — Encounter: Payer: Self-pay | Admitting: Family Medicine

## 2010-02-17 LAB — CONVERTED CEMR LAB
BUN: 33 mg/dL — ABNORMAL HIGH (ref 6–23)
CO2: 27 meq/L (ref 19–32)
Calcium: 10.7 mg/dL — ABNORMAL HIGH (ref 8.4–10.5)
Chloride: 98 meq/L (ref 96–112)
Creatinine, Ser: 1.21 mg/dL — ABNORMAL HIGH (ref 0.40–1.20)
Glucose, Bld: 82 mg/dL (ref 70–99)
Potassium: 4.3 meq/L (ref 3.5–5.3)
Sodium: 136 meq/L (ref 135–145)

## 2010-02-19 ENCOUNTER — Other Ambulatory Visit: Admission: RE | Admit: 2010-02-19 | Discharge: 2010-02-19 | Payer: Self-pay | Admitting: Family Medicine

## 2010-02-19 ENCOUNTER — Ambulatory Visit: Payer: Self-pay | Admitting: Family Medicine

## 2010-02-19 DIAGNOSIS — D509 Iron deficiency anemia, unspecified: Secondary | ICD-10-CM | POA: Insufficient documentation

## 2010-02-19 DIAGNOSIS — G5600 Carpal tunnel syndrome, unspecified upper limb: Secondary | ICD-10-CM | POA: Insufficient documentation

## 2010-02-19 LAB — CONVERTED CEMR LAB: OCCULT 1: NEGATIVE

## 2010-02-23 ENCOUNTER — Telehealth: Payer: Self-pay | Admitting: Family Medicine

## 2010-02-23 LAB — CONVERTED CEMR LAB
Basophils Absolute: 0 10*3/uL (ref 0.0–0.1)
Basophils Relative: 0 % (ref 0–1)
Eosinophils Absolute: 0.2 10*3/uL (ref 0.0–0.7)
Eosinophils Relative: 5 % (ref 0–5)
HCT: 36.1 % (ref 36.0–46.0)
Hemoglobin: 11.3 g/dL — ABNORMAL LOW (ref 12.0–15.0)
Iron: 96 ug/dL (ref 42–145)
Lymphocytes Relative: 46 % (ref 12–46)
Lymphs Abs: 2.3 10*3/uL (ref 0.7–4.0)
MCHC: 31.3 g/dL (ref 30.0–36.0)
MCV: 91.6 fL (ref 78.0–100.0)
Monocytes Absolute: 0.4 10*3/uL (ref 0.1–1.0)
Monocytes Relative: 9 % (ref 3–12)
Neutro Abs: 2 10*3/uL (ref 1.7–7.7)
Neutrophils Relative %: 40 % — ABNORMAL LOW (ref 43–77)
Platelets: 230 10*3/uL (ref 150–400)
RBC: 3.94 M/uL (ref 3.87–5.11)
RDW: 13.2 % (ref 11.5–15.5)
WBC: 4.9 10*3/uL (ref 4.0–10.5)

## 2010-02-24 ENCOUNTER — Encounter: Payer: Self-pay | Admitting: Family Medicine

## 2010-02-24 LAB — CONVERTED CEMR LAB: Pap Smear: NEGATIVE

## 2010-03-23 ENCOUNTER — Encounter: Admission: RE | Admit: 2010-03-23 | Discharge: 2010-03-23 | Payer: Self-pay | Admitting: Family Medicine

## 2010-04-02 ENCOUNTER — Encounter: Payer: Self-pay | Admitting: Family Medicine

## 2010-04-28 ENCOUNTER — Telehealth: Payer: Self-pay | Admitting: Family Medicine

## 2010-04-29 ENCOUNTER — Encounter: Payer: Self-pay | Admitting: Family Medicine

## 2010-05-07 HISTORY — PX: CARDIAC CATHETERIZATION: SHX172

## 2010-05-22 ENCOUNTER — Telehealth: Payer: Self-pay | Admitting: Family Medicine

## 2010-06-05 ENCOUNTER — Inpatient Hospital Stay (HOSPITAL_COMMUNITY): Admission: EM | Admit: 2010-06-05 | Discharge: 2010-06-17 | Payer: Self-pay | Admitting: Emergency Medicine

## 2010-06-08 ENCOUNTER — Encounter (INDEPENDENT_AMBULATORY_CARE_PROVIDER_SITE_OTHER): Payer: Self-pay | Admitting: Internal Medicine

## 2010-06-15 ENCOUNTER — Ambulatory Visit: Payer: Self-pay | Admitting: Vascular Surgery

## 2010-06-15 ENCOUNTER — Encounter (INDEPENDENT_AMBULATORY_CARE_PROVIDER_SITE_OTHER): Payer: Self-pay | Admitting: Cardiology

## 2010-06-15 ENCOUNTER — Encounter: Payer: Self-pay | Admitting: Internal Medicine

## 2010-06-25 ENCOUNTER — Ambulatory Visit: Payer: Self-pay | Admitting: Family Medicine

## 2010-06-25 ENCOUNTER — Ambulatory Visit (HOSPITAL_COMMUNITY): Admission: RE | Admit: 2010-06-25 | Discharge: 2010-06-25 | Payer: Self-pay | Admitting: Family Medicine

## 2010-06-25 LAB — CONVERTED CEMR LAB
ALT: 21 units/L (ref 0–35)
AST: 35 units/L (ref 0–37)
Albumin: 4 g/dL (ref 3.5–5.2)
Alkaline Phosphatase: 54 units/L (ref 39–117)
BUN: 24 mg/dL — ABNORMAL HIGH (ref 6–23)
Bilirubin, Direct: 0.2 mg/dL (ref 0.0–0.3)
CO2: 23 meq/L (ref 19–32)
Calcium: 9.4 mg/dL (ref 8.4–10.5)
Chloride: 108 meq/L (ref 96–112)
Cholesterol: 173 mg/dL (ref 0–200)
Creatinine, Ser: 1.09 mg/dL (ref 0.40–1.20)
Glucose, Bld: 95 mg/dL (ref 70–99)
HDL: 46 mg/dL (ref >39)
Indirect Bilirubin: 0.7 mg/dL (ref 0.0–0.9)
LDL Cholesterol: 97 mg/dL (ref 0–99)
Potassium: 4.3 meq/L (ref 3.5–5.3)
Sodium: 143 meq/L (ref 135–145)
Total Bilirubin: 0.9 mg/dL (ref 0.3–1.2)
Total CHOL/HDL Ratio: 3.8
Total Protein: 6.4 g/dL (ref 6.0–8.3)
Triglycerides: 152 mg/dL — ABNORMAL HIGH (ref <150)
VLDL: 30 mg/dL (ref 0–40)

## 2010-06-26 ENCOUNTER — Telehealth: Payer: Self-pay | Admitting: Family Medicine

## 2010-06-29 ENCOUNTER — Encounter: Payer: Self-pay | Admitting: Family Medicine

## 2010-07-02 ENCOUNTER — Ambulatory Visit: Payer: Self-pay | Admitting: Family Medicine

## 2010-07-03 ENCOUNTER — Encounter: Payer: Self-pay | Admitting: Family Medicine

## 2010-07-04 ENCOUNTER — Encounter: Payer: Self-pay | Admitting: Family Medicine

## 2010-07-05 ENCOUNTER — Encounter: Payer: Self-pay | Admitting: Family Medicine

## 2010-07-08 ENCOUNTER — Encounter: Payer: Self-pay | Admitting: Family Medicine

## 2010-08-25 ENCOUNTER — Ambulatory Visit: Payer: Self-pay | Admitting: Family Medicine

## 2010-08-26 ENCOUNTER — Encounter: Payer: Self-pay | Admitting: Family Medicine

## 2010-09-03 LAB — HM COLONOSCOPY

## 2010-09-03 LAB — HM MAMMOGRAPHY

## 2010-09-03 LAB — HM PAP SMEAR

## 2010-09-06 DIAGNOSIS — F3289 Other specified depressive episodes: Secondary | ICD-10-CM | POA: Insufficient documentation

## 2010-09-06 DIAGNOSIS — F329 Major depressive disorder, single episode, unspecified: Secondary | ICD-10-CM | POA: Insufficient documentation

## 2010-09-10 ENCOUNTER — Emergency Department (HOSPITAL_COMMUNITY)
Admission: EM | Admit: 2010-09-10 | Discharge: 2010-09-10 | Payer: Self-pay | Source: Home / Self Care | Admitting: Emergency Medicine

## 2010-09-10 ENCOUNTER — Telehealth: Payer: Self-pay | Admitting: Family Medicine

## 2010-09-10 ENCOUNTER — Inpatient Hospital Stay (HOSPITAL_COMMUNITY): Admission: EM | Admit: 2010-09-10 | Discharge: 2010-09-12 | Payer: Self-pay | Source: Home / Self Care

## 2010-09-10 LAB — POCT CARDIAC MARKERS
CKMB, poc: 1.2 ng/mL (ref 1.0–8.0)
CKMB, poc: 1.9 ng/mL (ref 1.0–8.0)
CKMB, poc: 1.4 ng/mL (ref 1.0–8.0)
Myoglobin, poc: 74.5 ng/mL (ref 12–200)
Myoglobin, poc: 102 ng/mL (ref 12–200)
Myoglobin, poc: 68.9 ng/mL (ref 12–200)
Troponin i, poc: <0.05 ng/mL (ref 0.00–0.09)
Troponin i, poc: <0.05 ng/mL (ref 0.00–0.09)
Troponin i, poc: <0.05 ng/mL (ref 0.00–0.09)

## 2010-09-10 LAB — CBC
HCT: 36.4 % (ref 36.0–46.0)
Hemoglobin: 11.4 g/dL — ABNORMAL LOW (ref 12.0–15.0)
MCH: 27.2 pg (ref 26.0–34.0)
MCHC: 31.3 g/dL (ref 30.0–36.0)
MCV: 86.9 fL (ref 78.0–100.0)
Platelets: 254 10*3/uL (ref 150–400)
RBC: 4.19 MIL/uL (ref 3.87–5.11)
RDW: 13.8 % (ref 11.5–15.5)
WBC: 6.2 10*3/uL (ref 4.0–10.5)

## 2010-09-10 LAB — DIFFERENTIAL
Basophils Absolute: 0 10*3/uL (ref 0.0–0.1)
Basophils Relative: 0 % (ref 0–1)
Eosinophils Absolute: 0.1 10*3/uL (ref 0.0–0.7)
Eosinophils Relative: 2 % (ref 0–5)
Lymphocytes Relative: 33 % (ref 12–46)
Lymphs Abs: 2 10*3/uL (ref 0.7–4.0)
Monocytes Absolute: 0.4 10*3/uL (ref 0.1–1.0)
Monocytes Relative: 7 % (ref 3–12)
Neutro Abs: 3.6 10*3/uL (ref 1.7–7.7)
Neutrophils Relative %: 59 % (ref 43–77)

## 2010-09-10 LAB — BASIC METABOLIC PANEL
BUN: 15 mg/dL (ref 6–23)
CO2: 30 mEq/L (ref 19–32)
Calcium: 10.2 mg/dL (ref 8.4–10.5)
Chloride: 103 mEq/L (ref 96–112)
Creatinine, Ser: 1.22 mg/dL — ABNORMAL HIGH (ref 0.4–1.2)
GFR calc Af Amer: 52 mL/min — ABNORMAL LOW (ref >60)
GFR calc non Af Amer: 43 mL/min — ABNORMAL LOW (ref >60)
Glucose, Bld: 102 mg/dL — ABNORMAL HIGH (ref 70–99)
Potassium: 3.8 mEq/L (ref 3.5–5.1)
Sodium: 141 mEq/L (ref 135–145)

## 2010-09-11 LAB — CBC
HCT: 33.8 % — ABNORMAL LOW (ref 36.0–46.0)
Hemoglobin: 11.2 g/dL — ABNORMAL LOW (ref 12.0–15.0)
MCH: 28.8 pg (ref 26.0–34.0)
MCHC: 33.1 g/dL (ref 30.0–36.0)
MCV: 86.9 fL (ref 78.0–100.0)
Platelets: 214 10*3/uL (ref 150–400)
RBC: 3.89 MIL/uL (ref 3.87–5.11)
RDW: 13.9 % (ref 11.5–15.5)
WBC: 9.2 10*3/uL (ref 4.0–10.5)

## 2010-09-11 LAB — COMPREHENSIVE METABOLIC PANEL
ALT: 13 U/L (ref 0–35)
AST: 19 U/L (ref 0–37)
Albumin: 3.2 g/dL — ABNORMAL LOW (ref 3.5–5.2)
Alkaline Phosphatase: 50 U/L (ref 39–117)
BUN: 14 mg/dL (ref 6–23)
CO2: 28 mEq/L (ref 19–32)
Calcium: 9.1 mg/dL (ref 8.4–10.5)
Chloride: 105 mEq/L (ref 96–112)
Creatinine, Ser: 1.03 mg/dL (ref 0.4–1.2)
GFR calc Af Amer: >60 mL/min (ref >60)
GFR calc non Af Amer: 52 mL/min — ABNORMAL LOW (ref >60)
Glucose, Bld: 116 mg/dL — ABNORMAL HIGH (ref 70–99)
Potassium: 3.9 mEq/L (ref 3.5–5.1)
Sodium: 139 mEq/L (ref 135–145)
Total Bilirubin: 0.5 mg/dL (ref 0.3–1.2)
Total Protein: 6.2 g/dL (ref 6.0–8.3)

## 2010-09-11 LAB — DIFFERENTIAL
Basophils Absolute: 0 10*3/uL (ref 0.0–0.1)
Basophils Relative: 0 % (ref 0–1)
Eosinophils Absolute: 0.1 10*3/uL (ref 0.0–0.7)
Eosinophils Relative: 1 % (ref 0–5)
Lymphocytes Relative: 20 % (ref 12–46)
Lymphs Abs: 1.8 10*3/uL (ref 0.7–4.0)
Monocytes Absolute: 0.8 10*3/uL (ref 0.1–1.0)
Monocytes Relative: 9 % (ref 3–12)
Neutro Abs: 6.5 10*3/uL (ref 1.7–7.7)
Neutrophils Relative %: 70 % (ref 43–77)

## 2010-09-11 LAB — CARDIAC PANEL(CRET KIN+CKTOT+MB+TROPI)
CK, MB: 1.1 ng/mL (ref 0.3–4.0)
CK, MB: 0.6 ng/mL (ref 0.3–4.0)
Relative Index: INVALID (ref 0.0–2.5)
Relative Index: INVALID (ref 0.0–2.5)
Total CK: 45 U/L (ref 7–177)
Total CK: 42 U/L (ref 7–177)
Troponin I: 0.01 ng/mL (ref 0.00–0.06)
Troponin I: 0.01 ng/mL (ref 0.00–0.06)

## 2010-09-11 LAB — MAGNESIUM: Magnesium: 1.6 mg/dL (ref 1.5–2.5)

## 2010-09-24 ENCOUNTER — Ambulatory Visit
Admission: RE | Admit: 2010-09-24 | Discharge: 2010-09-24 | Payer: Self-pay | Source: Home / Self Care | Attending: Family Medicine | Admitting: Family Medicine

## 2010-09-25 LAB — CONVERTED CEMR LAB
BUN: 16 mg/dL (ref 6–23)
CO2: 29 meq/L (ref 19–32)
Calcium: 9.9 mg/dL (ref 8.4–10.5)
Chloride: 106 meq/L (ref 96–112)
Creatinine, Ser: 0.88 mg/dL (ref 0.40–1.20)
Glucose, Bld: 80 mg/dL (ref 70–99)
Hgb A1c MFr Bld: 6 % — ABNORMAL HIGH (ref <5.7)
Potassium: 4.1 meq/L (ref 3.5–5.3)
Sodium: 142 meq/L (ref 135–145)
TSH: 2.27 microintl units/mL (ref 0.350–4.500)

## 2010-09-26 ENCOUNTER — Encounter: Payer: Self-pay | Admitting: Family Medicine

## 2010-09-27 ENCOUNTER — Encounter: Payer: Self-pay | Admitting: Family Medicine

## 2010-09-28 ENCOUNTER — Ambulatory Visit: Admit: 2010-09-28 | Payer: Self-pay | Admitting: Family Medicine

## 2010-09-29 ENCOUNTER — Telehealth: Payer: Self-pay | Admitting: Family Medicine

## 2010-10-01 ENCOUNTER — Ambulatory Visit (HOSPITAL_COMMUNITY)
Admission: RE | Admit: 2010-10-01 | Discharge: 2010-10-01 | Payer: Self-pay | Source: Home / Self Care | Attending: Family Medicine | Admitting: Family Medicine

## 2010-10-01 NOTE — Discharge Summary (Addendum)
[name], [name]            ACCOUNT NO.:  [id number]  MEDICAL RECORD NO.:  [id number]          PATIENT TYPE:  INP  LOCATION:  A319                          FACILITY:  [facility]  PHYSICIAN:  Kathlen Mody, MD       DATE OF BIRTH:  November 14, 1933  DATE OF ADMISSION:  09/10/2010 DATE OF DISCHARGE:  09/12/2010                              DISCHARGE SUMMARY   PRIMARY CARE PHYSICIAN:  Dr. Dorothe Pea.  DISCHARGE DIAGNOSIS:  Chest pain, most likely secondary to her gastroesophageal reflux disease.  OTHER DIAGNOSES:  Non-ST-elevated myocardial infarction, coronary artery disease, hyperlipidemia, angioedema secondary to ACE inhibitor, pneumonia, cholecystectomy, appendectomy, hysterectomy.         DISCHARGE MEDICATIONS: 1. Skelaxin 800 mg p.o. t.i.d. 2. Protonix 40 mg p.o. daily. 3. Vitamin B6. 4. Metoprolol 25 mg p.o. b.i.d. 5. EpiPen. 6. Vitamin C. 7. Multivitamin. 8. Aspirin 81 mg daily. 9. Lipitor 20 mg at bedtime once a day as needed.  PERTINENT LABORATORY DATA:  The patient had a CBC done which was pretty much normal except for hemoglobin of 11.4.  Point-of care cardiac markers were negative.  BMP showed sodium of 141, potassium of 3.8, chloride of 103, bicarb of 30, glucose of 102, BUN of 15, creatinine of 1.22, calcium of 10.2.  Second cardiac enzymes negative.  Third set of cardiac enzymes negative.  There was a CK-MB done which showed 1.1, troponin 0.01.  Another set of cardiac enzymes done in January 2012 was also negative.  EKG revealed sinus rhythm.  An echocardiogram was done June 08, 2010, showed good systolic function, ejection fraction of 55% to 60%, no regional wall  abnormalities, diastolic function of the left ventricle was also normal, right ventricular systolic pressures were also slightly increased.  RADIOLOGY:  The patient had a chest x-ray, which did not show any acute abnormality.  Another PA and lateral x-ray was done the next day, which showed  subsegmental atelectasis in the left lung base with an otherwise stable cardiopulmonary appearance.  CT of head without contrast was done, which did not show any acute intracranial hemorrhage.  CT evidence of large acute infarct, mild partial opacification of left maxillary sinus.  BRIEF HOSPITAL COURSE:  This is a 77 year old lady with past medical history of non-ST-elevation MI, status post cardiac catheterization in October 2011, came in for chest pain.  The patient said that she heard the news that her father has expired, few minutes before the onset of the chest pain.  The chest pain was nonexertional.  Acute coronary syndrome was ruled out with negative enzymes and normal EKG with a recent cardiac catheterization; however, patient was admitted and observed overnight.  She was given an antianxiety medication, and acute coronary syndrome was ruled out.  It was probably most likely secondary to anxiety versus acid reflux.  On the day of discharge, the patient's vitals were, she had a temperature of 97.5, pulse of 65 per minute, respirations of 20 per minute, saturating 96% on room air, blood pressure was 131/76.  She was alert, afebrile, oriented x3.  She is comfortable, sitting on the chair, and surrounded by family members.  Cardiovascular, S1 and S2 heard.  No rubs, no gallops.  Regular rate and rhythm.  Respiratory exam, good air entry bilaterally, no adventitious sounds.  Abdomen is soft, nontender, nondistended.  Extremities:  No pedal edema.  The patient was hemodynamically stable, and her chest pain was thought to be secondary to anxiety and stress from hearing the news of the demise of her father. The patient's family members were at bedside and requested that she be discharged with no procedures were to be done.  DISCHARGE INSTRUCTIONS:  The patient was asked to follow with her cardiologist and her primary care physician in about 1-2 weeks.  DISCHARGE MEDICATIONS:  She  was discharged home on Protonix 40 mg daily.  Time spent with the patient was 40 minutes.          ______________________________ Kathlen Mody, MD     [facility]  D:  09/29/2010  T:  09/29/2010  Job:  [id number]  Electronically Signed by Kathlen Mody MD on 10/01/2010 11:05:53 PM

## 2010-10-06 NOTE — Miscellaneous (Signed)
Summary: NARC REFILL  Clinical Lists Changes  Medications: Rx of OXYCODONE-ACETAMINOPHEN 7.5-325 MG TABS (OXYCODONE-ACETAMINOPHEN) ONE TAB by mouth QHS;  #30 x 0;  Signed;  Entered by: Everitt Amber LPN;  Authorized by: Syliva Overman MD;  Method used: Handwritten    Prescriptions: OXYCODONE-ACETAMINOPHEN 7.5-325 MG TABS (OXYCODONE-ACETAMINOPHEN) ONE TAB by mouth QHS  #30 x 0   Entered by:   Everitt Amber LPN   Authorized by:   Syliva Overman MD   Signed by:   Everitt Amber LPN on 27/25/3664   Method used:   Handwritten   RxID:   [id number]

## 2010-10-06 NOTE — Medication Information (Signed)
Summary: Tax adviser   Imported By: Lind Guest 10/15/2009 11:38:05  _____________________________________________________________________  External Attachment:    Type:   Image     Comment:   External Document

## 2010-10-06 NOTE — Progress Notes (Signed)
Summary: gas pains  Phone Note Call from Patient   Summary of Call: Wants sample for stomach. Having alot of gas pain [phone] Initial call taken by: Rudene Anda,  April 04, 2008 1:21 PM  Follow-up for Phone Call        advised pt per MD we were sending Rx to her pharmacy for reflux and gas pains Follow-up by: Chipper Herb,  April 05, 2008 8:11 AM    New/Updated Medications: OMEPRAZOLE 20 MG  TBEC (OMEPRAZOLE) Take 1 tablet by mouth two times a day   Prescriptions: OMEPRAZOLE 20 MG  TBEC (OMEPRAZOLE) Take 1 tablet by mouth two times a day  #60 x 3   Entered by:   Chipper Herb   Authorized by:   Syliva Overman MD   Signed by:   Chipper Herb on 04/05/2008   Method used:   Electronically sent to ...       [facility]       [address]       [place], [place]  [address]       Ph: [phone]       Fax: [phone]   RxID:   [id number]

## 2010-10-06 NOTE — Progress Notes (Signed)
Summary: FYI  Phone Note Other Incoming   Summary of Call: [name] from [facility]... red area on her leg, 8 cm x6 cm, doesn't look like anything has bitten her, it is located on the right inner thigh, she states it was small this more, as the day has went on it has gotten bigger, started out itching no longer itching.  Victorino Dike is going to trace around it with a marker, she will look at the spot on Monday when she goes back to see [name].  Victorino Dike states she has never seen anything like this. Initial call taken by: Curtis Sites,  June 26, 2010 1:18 PM  Follow-up for Phone Call        noted Follow-up by: Adella Hare LPN,  June 26, 2010 2:04 PM

## 2010-10-06 NOTE — Assessment & Plan Note (Signed)
Summary: crd/gu  pt returned ifobt and it was negative  Allergies: 1)  ! Pcn 2)  ! Codeine 3)  ! Wellbutrin  Other Orders: Immuno-chemical Fecal Occult (16109)  Appended Document: crd/gu stool nagative for occult blood; need to see TCS report into EMR  Appended Document: crd/gu SS sent request for tcs report to Dr. Lovell Sheehan office  Appended Document: crd/gu pt aware

## 2010-10-06 NOTE — Miscellaneous (Signed)
Summary: refill  Clinical Lists Changes  Medications: Changed medication from OXYCODONE-ACETAMINOPHEN 7.5-325 MG TABS (OXYCODONE-ACETAMINOPHEN) ONE TAB by mouth QHS to OXYCODONE-ACETAMINOPHEN 7.5-325 MG TABS (OXYCODONE-ACETAMINOPHEN) ONE TAB by mouth QHS - Signed Rx of OXYCODONE-ACETAMINOPHEN 7.5-325 MG TABS (OXYCODONE-ACETAMINOPHEN) ONE TAB by mouth QHS;  #30 x 0;  Signed;  Entered by: Everitt Amber;  Authorized by: Syliva Overman MD;  Method used: Handwritten    Prescriptions: OXYCODONE-ACETAMINOPHEN 7.5-325 MG TABS (OXYCODONE-ACETAMINOPHEN) ONE TAB by mouth QHS  #30 x 0   Entered by:   Everitt Amber   Authorized by:   Syliva Overman MD   Signed by:   Everitt Amber on 05/28/2009   Method used:   Handwritten   RxID:   [id number]

## 2010-10-06 NOTE — Progress Notes (Signed)
  Phone Note Other Incoming   Caller: dr ssimpson Summary of Call: [pls order fasting chem 7 and vit D level to be drawn next week, she already knows, leave up front, her aunt may come to collect the labsheet byfriday  dx htn , dehydration Initial call taken by: Syliva Overman MD,  September 10, 2009 6:36 PM  Follow-up for Phone Call        labs ordered Follow-up by: Worthy Keeler LPN,  September 11, 2009 9:46 AM

## 2010-10-06 NOTE — Medication Information (Signed)
Summary: Tax adviser   Imported By: Lind Guest 07/30/2008 09:45:40  _____________________________________________________________________  External Attachment:    Type:   Image     Comment:   External Document

## 2010-10-06 NOTE — Assessment & Plan Note (Signed)
Summary: office visit   Vital Signs:  Patient profile:   77 year old female Menstrual status:  hysterectomy Height:      62 inches Weight:      155.75 pounds BMI:     28.59 O2 Sat:      99 % on Room air Pulse rate:   82 / minute Pulse rhythm:   regular Resp:     16 per minute BP sitting:   130 / 88  (left arm)  Vitals Entered By: Mauricia Area CMA (June 25, 2010 9:35 AM)  Nutrition Counseling: Patient's BMI is greater than 25 and therefore counseled on weight management options.  O2 Flow:  Room air CC: follow up Comments pt had allergic reaction blood pressure medication not listed in Allergies.   Primary Care Penney Domanski:  Lodema Hong  CC:  follow up.  History of Present Illness: Pt. in today following prolonged hospitalisation when she presentesd with lkife threatening angioedema. She was transferred to [facility] for evaluation for CAD , had cath which was negative.  Pt and her daughter state that while at [facility] she again developed significant right facial swelling, an appt for allergy eval is pending but urgent. She still feels weak and deconditioned from her prolonged hospitalisation, but has excellent family support from her 2 daughters.  Allergies: 1)  ! Pcn 2)  ! Codeine 3)  ! Wellbutrin  Review of Systems General:  Complains of fatigue, weakness, and weight loss. Eyes:  Denies blurring and discharge. ENT:  Denies hoarseness, nasal congestion, postnasal drainage, and sinus pressure. CV:  Denies chest pain or discomfort, palpitations, and swelling of feet. Resp:  Denies cough and sputum productive. GI:  Denies abdominal pain, constipation, diarrhea, nausea, and vomiting. GU:  Denies dysuria and urinary frequency. MS:  Complains of joint pain, low back pain, mid back pain, muscle weakness, and stiffness; reportedly using no pain meds since her d/c. Derm:  Denies insect bite(s), lesion(s), and rash. Neuro:  Complains of memory loss and poor balance; denies sensation of room  spinning and tingling; pt weak and deconditioned from prolonged hoispitalisation, receiving therapy at home. Psych:  Complains of anxiety; denies depression and mental problems. Endo:  Denies cold intolerance, excessive thirst, excessive urination, and heat intolerance. Heme:  Denies abnormal bruising and bleeding. Allergy:  recent life threatening angioedema.  Physical Exam  General:  Well-developed,well-nourished,in no acute distress; alert,apt somewhat blunted ion usual gregarioius spirit, as she is recupewrating from life threatening angioedema which required mechanicAL VENTILLATION WHICH WAS TRAUMATIC. HEENT: No facial asymmetry,  EOMI, No sinus tenderness, TM's Clear, oropharynx  pink and moist.   Chest: Clear to auscultation bilaterally.  CVS: S1, S2, No murmurs, No S3.   Abd: Soft, Nontender.  MS: decrreased  ROM spine, hips, shoulders and knees.  Ext: No edema.   CNS: CN 2-12 intact, power tone and sensation normal throughout.   Skin: Intact, no visible lesions or rashes.  Psych: Good eye contact, bluntedl affect.  Memory loss, mild, not anxious or depressed appearing.      Impression & Recommendations:  Problem # 1:  ANGIOEDEMA (ICD-995.1) Assessment Comment Only  Orders: Allergy Referral  (Allergy) Medicare Electronic Prescription [phone])  Problem # 2:  UNSPECIFIED VIRAL PNEUMONIA (ICD-480.9) Assessment: Improved  Orders: CXR- 2view (CXR)  Problem # 3:  BACK PAIN WITH RADICULOPATHY (ICD-729.2) Assessment: Improved on no pain meds at this time  Problem # 4:  HYPERTENSION (ICD-401.9) Assessment: Unchanged  The following medications were removed from the medication list:  Benazepril Hcl 40 Mg Tabs (Benazepril hcl) .Marland Kitchen... Take 1 tablet by mouth once a day    Avalide 300-25 Mg Tabs (Irbesartan-hydrochlorothiazide) .Marland Kitchen... Take 1 tablet by mouth once a day Her updated medication list for this problem includes:    Metoprolol Tartrate 25 Mg Tabs (Metoprolol  tartrate) .Marland Kitchen... Take one half tablet by mouth two times a day  BP today: 130/88 Prior BP: 118/74 (02/19/2010)  Labs Reviewed: K+: 4.3 (02/16/2010) Creat: : 1.21 (02/16/2010)   Chol: 144 (11/28/2009)   HDL: 53 (11/28/2009)   LDL: 74 (11/28/2009)   TG: 83 (11/28/2009)  Problem # 5:  HYPERLIPIDEMIA (ICD-272.4) Assessment: Comment Only  Her updated medication list for this problem includes:    Lipitor 20 Mg Tabs (Atorvastatin calcium) .Marland Kitchen... Take 1 tab by mouth at bedtime  Labs Reviewed: SGOT: 29 (11/28/2009)   SGPT: 19 (11/28/2009)   HDL:53 (11/28/2009), 39 (06/06/2009)  LDL:74 (11/28/2009), 74 (06/06/2009)  Chol:144 (11/28/2009), 146 (06/06/2009)  Trig:83 (11/28/2009), 166 (06/06/2009)  Complete Medication List: 1)  Skelaxin 800 Mg Tabs (Metaxalone) .... Take 1 tablet by mouth three times a day 2)  Hydrocodone-acetaminophen 7.5-750 Mg Tabs (Hydrocodone-acetaminophen) .... Take 1 tablet by mouth four times a day 3)  Lipitor 20 Mg Tabs (Atorvastatin calcium) .... Take 1 tab by mouth at bedtime 4)  Oscal 500/200 D-3 500-200 Mg-unit Tabs (Calcium-vitamin d) .... Take 1 tablet by mouth three times a day 5)  Aspirin 81 Mg Tbec (Aspirin) .... Take 1 tablet by mouth once a day 6)  Vitamin C 500 Mg Tabs (Ascorbic acid) .... Take one tablets by mouth daily 7)  Nitroquick 0.4 Mg Subl (Nitroglycerin) .... Use as directed 8)  Centrum Silver Tabs (Multiple vitamins-minerals) .... Take 1 tablet by mouth once a day 9)  Metoprolol Tartrate 25 Mg Tabs (Metoprolol tartrate) .... Take one half tablet by mouth two times a day 10)  Oxycodone-acetaminophen 7.5-325 Mg Tabs (Oxycodone-acetaminophen) .... One tab by mouth at bedtime. 11)  B-12 Dots 500 Mcg Subl (Cyanocobalamin) .... One tab under tongue once daily 12)  Slow Release Iron 47.5 Mg Cr-tabs (Ferrous sulfate) .... One tab by mouth qd 13)  Omeprazole 40 Mg Cpdr (Omeprazole) .... Take 1 capsule by mouth once a day 14)  Vitamin B6 200 Mg  .Marland Kitchen.. 1 tab  daily 15)  Gericare Iron  .Marland Kitchen.. 1 tab daily 16)  Pure Vitamin C500 Mg  .Marland Kitchen.. 1 tab daily 17)  Epipen 2-pak 0.3 Mg/0.56ml Devi (Epinephrine) .... Use as directed  Patient Instructions: 1)  Please schedule a follow-up appointment in 2 months. 2)  you will be referred to allergist and ti pulmonary specialist if needed. 3)  an epipen will be sent to your phaarmacy. 4)  continue all meds you are currently on. 5)  Take time to recover, I am glad that you are doing better 6)  cXR today Prescriptions: EPIPEN 2-PAK 0.3 MG/0.3ML DEVI (EPINEPHRINE) Use as directed  #1 x 4   Entered and Authorized by:   Syliva Overman MD   Signed by:   Syliva Overman MD on 06/27/2010   Method used:   Electronically to        [facility] (retail)       [address]       [place], [place]  [address]       Ph: [phone]       Fax: [phone]   RxID:   [id number]  Orders Added: 1)  CXR- 2view [CXR] 2)  Est. Patient Level IV [04540] 3)  Allergy Referral  [Allergy] 4)  Medicare Electronic Prescription [J8119]  Appended Document: office visit need to add ace and arb allergy, angioedema

## 2010-10-06 NOTE — Medication Information (Signed)
Summary: Tax adviser   Imported By: Lind Guest 08/12/2009 15:45:46  _____________________________________________________________________  External Attachment:    Type:   Image     Comment:   External Document

## 2010-10-06 NOTE — Assessment & Plan Note (Signed)
Summary: CERTIFICATION   Allergies: 1)  ! Pcn 2)  ! Codeine 3)  ! Wellbutrin   Complete Medication List: 1)  Skelaxin 800 Mg Tabs (Metaxalone) .... Take 1 tablet by mouth three times a day 2)  Hydrocodone-acetaminophen 7.5-750 Mg Tabs (Hydrocodone-acetaminophen) .... Take 1 tablet by mouth four times a day 3)  Lipitor 20 Mg Tabs (Atorvastatin calcium) .... Take 1 tab by mouth at bedtime 4)  Oscal 500/200 D-3 500-200 Mg-unit Tabs (Calcium-vitamin d) .... Take 1 tablet by mouth three times a day 5)  Aspirin 81 Mg Tbec (Aspirin) .... Take 1 tablet by mouth once a day 6)  Benazepril Hcl 40 Mg Tabs (Benazepril hcl) .... Take 1 tablet by mouth once a day 7)  Vitamin C 500 Mg Tabs (Ascorbic acid) .... Take one tablets by mouth daily 8)  Nitroquick 0.4 Mg Subl (Nitroglycerin) .... Use as directed 9)  Centrum Silver Tabs (Multiple vitamins-minerals) .... Take 1 tablet by mouth once a day 10)  Metoprolol Tartrate 25 Mg Tabs (Metoprolol tartrate) .... Take one half tablet by mouth two times a day 11)  Oxycodone-acetaminophen 7.5-325 Mg Tabs (Oxycodone-acetaminophen) .... One tab by mouth at bedtime. 12)  B-12 Dots 500 Mcg Subl (Cyanocobalamin) .... One tab under tongue once daily 13)  Slow Release Iron 47.5 Mg Cr-tabs (Ferrous sulfate) .... One tab by mouth qd 14)  Omeprazole 40 Mg Cpdr (Omeprazole) .... Take 1 capsule by mouth once a day 15)  Vitamin B6 200 Mg  .Marland Kitchen.. 1 tab daily 16)  Gericare Iron  .Marland Kitchen.. 1 tab daily 17)  Pure Vitamin C500 Mg  .Marland Kitchen.. 1 tab daily 18)  Epipen 2-pak 0.3 Mg/0.3ml Devi (Epinephrine) .... Use as directed   Orders Added: 1)  New Certification-Home Health [G0180]

## 2010-10-06 NOTE — Assessment & Plan Note (Signed)
Summary: OFFICE VISIT   Vital Signs:  Patient profile:   77 year old female Menstrual status:  hysterectomy Height:      62 inches Weight:      166.25 pounds BMI:     30.52 O2 Sat:      99 % Pulse rate:   59 / minute Pulse rhythm:   regular Resp:     16 per minute BP sitting:   160 / 84  (left arm) Cuff size:   large  Vitals Entered By: Everitt Amber LPN (December 01, 2009 10:48 AM)  Nutrition Counseling: Patient's BMI is greater than 25 and therefore counseled on weight management options. CC: Follow up chronic problems     Menstrual Status hysterectomy Last PAP Result Normal   Primary Care Provider:  Lodema Hong  CC:  Follow up chronic problems.  History of Present Illness: Reports  that she has been doing well. Denies recent fever or chills. Denies sinus pressure, nasal congestion , ear pain or sore throat. Denies chest congestion, or cough productive of sputum. Denies chest pain, palpitations, PND, orthopnea or leg swelling. Denies abdominal pain, nausea, vomitting, diarrhea or constipation. Denies change in bowel movements or bloody stool. Denies dysuria , frequency, incontinence or hesitancy. Denies  joint pain, swelling, or reduced mobility.Bit she does continue to have chronic back pain. Denies headaches, vertigo, seizures. Denies depression, anxiety or insomnia. Denies  rash, lesions, or itch. She is npon compliant with clonidine as prescribved, states it makes her sleepy, and she has been out of avalide for the past month.      Current Medications (verified): 1)  Skelaxin 800 Mg  Tabs (Metaxalone) .... Take 1 Tablet By Mouth Three Times A Day 2)  Hydrocodone-Acetaminophen 7.5-750 Mg  Tabs (Hydrocodone-Acetaminophen) .... Take 1 Tablet By Mouth Four Times A Day 3)  Lipitor 20 Mg  Tabs (Atorvastatin Calcium) .... Take 1 Tab By Mouth At Bedtime 4)  Oscal 500/200 D-3 500-200 Mg-Unit  Tabs (Calcium-Vitamin D) .... Take 1 Tablet By Mouth Three Times A Day 5)  Aspirin  81 Mg  Tbec (Aspirin) .... Take 1 Tablet By Mouth Once A Day 6)  Avalide 300-25 Mg  Tabs (Irbesartan-Hydrochlorothiazide) .... Take One Tab By Mouth Qd 7)  Benazepril Hcl 40 Mg  Tabs (Benazepril Hcl) .... Take 1 Tablet By Mouth Once A Day 8)  Vitamin C 500 Mg  Tabs (Ascorbic Acid) .... Take One Tablets By Mouth Daily 9)  Nitroquick 0.4 Mg  Subl (Nitroglycerin) .... Use As Directed 10)  Centrum Silver   Tabs (Multiple Vitamins-Minerals) .... Take 1 Tablet By Mouth Once A Day 11)  Metoprolol Tartrate 25 Mg  Tabs (Metoprolol Tartrate) .... Take One Half Tablet By Mouth Two Times A Day 12)  Clonidine Hcl 0.2 Mg  Tabs (Clonidine Hcl) .... One Tab By Mouth Bid 13)  Oxycodone-Acetaminophen 7.5-325 Mg Tabs (Oxycodone-Acetaminophen) .... One Tab By Mouth Qhs 14)  B-12 Dots 500 Mcg Subl (Cyanocobalamin) .... One Tab Under Tongue Once Daily 15)  Slow Release Iron 47.5 Mg Cr-Tabs (Ferrous Sulfate) .... One Tab By Mouth Qd 16)  Omeprazole 40 Mg Cpdr (Omeprazole) .... Take 1 Capsule By Mouth Once A Day  Allergies (verified): 1)  ! Pcn 2)  ! Codeine 3)  ! Wellbutrin  Review of Systems      See HPI Eyes:  Denies blurring and discharge. Endo:  Denies excessive thirst and excessive urination. Heme:  Denies abnormal bruising and bleeding. Allergy:  Denies hives or rash and  itching eyes.  Physical Exam  General:  Well-developed,well-nourished,in no acute distress; alert,appropriate and cooperative throughout examination HEENT: No facial asymmetry,  EOMI, No sinus tenderness, TM's Clear, oropharynx  pink and moist.   Chest: Clear to auscultation bilaterally.  CVS: S1, S2, No murmurs, No S3.   Abd: Soft, Nontender.  MS: decreased  ROM spine,adequate in  hips, shoulders and knees.  Ext: No edema.   CNS: CN 2-12 intact, power tone and sensation normal throughout.   Skin: Intact, no visible lesions or rashes.  Psych: Good eye contact, normal affect.  Memory intact, not anxious or depressed  appearing.     Impression & Recommendations:  Problem # 1:  GERD (ICD-530.81) Assessment Improved  The following medications were removed from the medication list:    Carafate 1 Gm Tabs (Sucralfate) ..... One tab by mouth ac and qhs Her updated medication list for this problem includes:    Omeprazole 40 Mg Cpdr (Omeprazole) .Marland Kitchen... Take 1 capsule by mouth once a day  Problem # 2:  BACK PAIN WITH RADICULOPATHY (ICD-729.2) Assessment: Unchanged  Problem # 3:  HYPERTENSION (ICD-401.9) Assessment: Deteriorated  The following medications were removed from the medication list:    Clonidine Hcl 0.2 Mg Tabs (Clonidine hcl) ..... One tab by mouth bid Her updated medication list for this problem includes:    Avalide 300-25 Mg Tabs (Irbesartan-hydrochlorothiazide) .Marland Kitchen... Take one tab by mouth qd    Benazepril Hcl 40 Mg Tabs (Benazepril hcl) .Marland Kitchen... Take 1 tablet by mouth once a day    Metoprolol Tartrate 25 Mg Tabs (Metoprolol tartrate) .Marland Kitchen... Take one half tablet by mouth two times a day    Avalide 300-25 Mg Tabs (Irbesartan-hydrochlorothiazide) .Marland Kitchen... Take 1 tablet by mouth once a day  Orders: T-Basic Metabolic Panel ([id number])  BP today: 160/84 Prior BP: 120/78 (09/10/2009)  Labs Reviewed: K+: 4.0 (11/28/2009) Creat: : 1.26 (11/28/2009)   Chol: 144 (11/28/2009)   HDL: 53 (11/28/2009)   LDL: 74 (11/28/2009)   TG: 83 (11/28/2009)  Problem # 4:  HYPERLIPIDEMIA (ICD-272.4) Assessment: Improved  Her updated medication list for this problem includes:    Lipitor 20 Mg Tabs (Atorvastatin calcium) .Marland Kitchen... Take 1 tab by mouth at bedtime  Labs Reviewed: SGOT: 29 (11/28/2009)   SGPT: 19 (11/28/2009)   HDL:53 (11/28/2009), 39 (06/06/2009)  LDL:74 (11/28/2009), 74 (06/06/2009)  Chol:144 (11/28/2009), 146 (06/06/2009)  Trig:83 (11/28/2009), 166 (06/06/2009)  Complete Medication List: 1)  Skelaxin 800 Mg Tabs (Metaxalone) .... Take 1 tablet by mouth three times a day 2)   Hydrocodone-acetaminophen 7.5-750 Mg Tabs (Hydrocodone-acetaminophen) .... Take 1 tablet by mouth four times a day 3)  Lipitor 20 Mg Tabs (Atorvastatin calcium) .... Take 1 tab by mouth at bedtime 4)  Oscal 500/200 D-3 500-200 Mg-unit Tabs (Calcium-vitamin d) .... Take 1 tablet by mouth three times a day 5)  Aspirin 81 Mg Tbec (Aspirin) .... Take 1 tablet by mouth once a day 6)  Avalide 300-25 Mg Tabs (Irbesartan-hydrochlorothiazide) .... Take one tab by mouth qd 7)  Benazepril Hcl 40 Mg Tabs (Benazepril hcl) .... Take 1 tablet by mouth once a day 8)  Vitamin C 500 Mg Tabs (Ascorbic acid) .... Take one tablets by mouth daily 9)  Nitroquick 0.4 Mg Subl (Nitroglycerin) .... Use as directed 10)  Centrum Silver Tabs (Multiple vitamins-minerals) .... Take 1 tablet by mouth once a day 11)  Metoprolol Tartrate 25 Mg Tabs (Metoprolol tartrate) .... Take one half tablet by mouth two times a day 12)  Oxycodone-acetaminophen  7.5-325 Mg Tabs (Oxycodone-acetaminophen) .... One tab by mouth qhs 13)  B-12 Dots 500 Mcg Subl (Cyanocobalamin) .... One tab under tongue once daily 14)  Slow Release Iron 47.5 Mg Cr-tabs (Ferrous sulfate) .... One tab by mouth qd 15)  Omeprazole 40 Mg Cpdr (Omeprazole) .... Take 1 capsule by mouth once a day 16)  Avalide 300-25 Mg Tabs (Irbesartan-hydrochlorothiazide) .... Take 1 tablet by mouth once a day  Patient Instructions: 1)  Please schedule a cPE in 2.5 months 2)  It is important that you exercise regularly at least 20 minutes 5 times a week. If you develop chest pain, have severe difficulty breathing, or feel very tired , stop exercising immediately and seek medical attention. 3)  You need to lose weight. Consider a lower calorie diet and regular exercise.  4)  Your cholesterol is excellent no med changes 5)  Eye exam recommended Prescriptions: OXYCODONE-ACETAMINOPHEN 7.5-325 MG TABS (OXYCODONE-ACETAMINOPHEN) ONE TAB by mouth QHS  #30 x 0   Entered by:   Everitt Amber LPN    Authorized by:   Syliva Overman MD   Signed by:   Syliva Overman MD on 12/01/2009   Method used:   Handwritten   RxID:   [id number] AVALIDE 300-25 MG TABS (IRBESARTAN-HYDROCHLOROTHIAZIDE) Take 1 tablet by mouth once a day  #30 x 3   Entered and Authorized by:   Syliva Overman MD   Signed by:   Syliva Overman MD on 12/01/2009   Method used:   Print then Give to Patient   RxID:   [id number]

## 2010-10-06 NOTE — Progress Notes (Signed)
Summary: written rx  Phone Note Call from Patient   Summary of Call: needs a rx for oxcod. [id number] Initial call taken by: Rudene Anda,  May 23, 2008 11:51 AM  Follow-up for Phone Call        patient picked up script in office today Follow-up by: Worthy Keeler LPN,  May 23, 2008 2:28 PM

## 2010-10-06 NOTE — Progress Notes (Signed)
  Phone Note Other Incoming   Caller: dr Rube Sanchez Summary of Call: pls order Fasting chem7, lipid and hepatic for pt to do i March and leave up front for her to collect, she already knows, dx hTN and hyperlipidemia Initial call taken by: Syliva Overman MD,  September 22, 2009 7:42 AM  Follow-up for Phone Call        test ordered Follow-up by: Worthy Keeler LPN,  September 22, 2009 10:29 AM

## 2010-10-06 NOTE — Miscellaneous (Signed)
Summary: [facility] REFILL  Clinical Lists Changes  Medications: Rx of OXYCODONE-ACETAMINOPHEN 7.5-325 MG TABS (OXYCODONE-ACETAMINOPHEN) ONE TAB by mouth QHS;  #30 x 0;  Signed;  Entered by: Everitt Amber;  Authorized by: Syliva Overman MD;  Method used: Handwritten    Prescriptions: OXYCODONE-ACETAMINOPHEN 7.5-325 MG TABS (OXYCODONE-ACETAMINOPHEN) ONE TAB by mouth QHS  #30 x 0   Entered by:   Everitt Amber   Authorized by:   Syliva Overman MD   Signed by:   Everitt Amber on 12/31/2008   Method used:   Handwritten   RxID:   [id number]

## 2010-10-06 NOTE — Miscellaneous (Signed)
Summary: [facility] REFILL  Clinical Lists Changes  Medications: Changed medication from OXYCODONE-ACETAMINOPHEN 7.5-325 MG TABS (OXYCODONE-ACETAMINOPHEN) ONE TAB by mouth QHS to OXYCODONE-ACETAMINOPHEN 7.5-325 MG TABS (OXYCODONE-ACETAMINOPHEN) ONE TAB by mouth QHS - Signed Rx of OXYCODONE-ACETAMINOPHEN 7.5-325 MG TABS (OXYCODONE-ACETAMINOPHEN) ONE TAB by mouth QHS;  #30 x 0;  Signed;  Entered by: Everitt Amber;  Authorized by: Syliva Overman MD;  Method used: Handwritten    Prescriptions: OXYCODONE-ACETAMINOPHEN 7.5-325 MG TABS (OXYCODONE-ACETAMINOPHEN) ONE TAB by mouth QHS  #30 x 0   Entered by:   Everitt Amber   Authorized by:   Syliva Overman MD   Signed by:   Everitt Amber on 02/06/2009   Method used:   Handwritten   RxID:   [id number]

## 2010-10-06 NOTE — Progress Notes (Signed)
Summary: RX  Phone Note Call from Patient   Summary of Call: AUNT HELEN GRAVES COMING TODAY FOR APPT. SHE WILL PICK UP [facility] RX AND MAKE SURE THAT THE DOB IS CORRECT 10[birth year] Initial call taken by: Lind Guest,  February 06, 2009 1:26 PM  Follow-up for Phone Call        Will give to [facility] Follow-up by: Everitt Amber,  February 06, 2009 2:44 PM

## 2010-10-06 NOTE — Letter (Signed)
Summary: Letter  Letter   Imported By: Lind Guest 02/25/2010 14:12:56  _____________________________________________________________________  External Attachment:    Type:   Image     Comment:   External Document

## 2010-10-06 NOTE — Assessment & Plan Note (Signed)
Summary: 6 WK FU POST PROCEDURE-CM   Visit Type:  Follow-up Visit Primary Care Provider:  Lodema Hong  Chief Complaint:  F/U post procedure.  History of Present Illness:  77 year old lady now doing much better after being treated for H. pylori. She feels like any "new person". EGD failed to demonstrate any significant abnormality. Pt says "I feel like a new person". Doing great.  She is not having any odynophagia dysphagia, early satiety nausea vomiting or abdominal pain. Reflux symptoms well controlled on omeprazole 40 mg orally daily  Current Medications (verified): 1)  Skelaxin 800 Mg  Tabs (Metaxalone) .... Take 1 Tablet By Mouth Three Times A Day 2)  Hydrocodone-Acetaminophen 7.5-750 Mg  Tabs (Hydrocodone-Acetaminophen) .... Take 1 Tablet By Mouth Four Times A Day 3)  Lipitor 20 Mg  Tabs (Atorvastatin Calcium) .... Take 1 Tab By Mouth At Bedtime 4)  Oscal 500/200 D-3 500-200 Mg-Unit  Tabs (Calcium-Vitamin D) .... Take 1 Tablet By Mouth Three Times A Day 5)  Aspirin 81 Mg  Tbec (Aspirin) .... Take 1 Tablet By Mouth Once A Day 6)  Avalide 300-25 Mg  Tabs (Irbesartan-Hydrochlorothiazide) .... Take One Tab By Mouth Qd 7)  Benazepril Hcl 40 Mg  Tabs (Benazepril Hcl) .... Take 1 Tablet By Mouth Once A Day 8)  Vitamin C 500 Mg  Tabs (Ascorbic Acid) .... Take One Tablets By Mouth Daily 9)  Nitroquick 0.4 Mg  Subl (Nitroglycerin) .... Use As Directed 10)  Centrum Silver   Tabs (Multiple Vitamins-Minerals) .... Take 1 Tablet By Mouth Once A Day 11)  Metoprolol Tartrate 25 Mg  Tabs (Metoprolol Tartrate) .... Take One Half Tablet By Mouth Two Times A Day 12)  Clonidine Hcl 0.2 Mg  Tabs (Clonidine Hcl) .... One Tab By Mouth Bid 13)  Oxycodone-Acetaminophen 7.5-325 Mg Tabs (Oxycodone-Acetaminophen) .... One Tab By Mouth Qhs 14)  B-12 Dots 500 Mcg Subl (Cyanocobalamin) .... One Tab Under Tongue Once Daily 15)  Carafate 1 Gm Tabs (Sucralfate) .... One Tab By Mouth Ac and Qhs 16)  Slow Release Iron 47.5  Mg Cr-Tabs (Ferrous Sulfate) .... One Tab By Mouth Qd 17)  Omeprazole 40 Mg Cpdr (Omeprazole) .... Take 1 Capsule By Mouth Once A Day  Allergies (verified): 1)  ! Pcn 2)  ! Codeine 3)  ! Wellbutrin  Past History:  Past Medical History: Last updated: 08/19/2007 Diabetes mellitus, type II Hyperlipidemia Obesity Severe DJD Left hand pain with numbness, probable carpal tunnel syndrome, new onset Hypertension CAD chronic back pain  Past Surgical History: Last updated: 08/19/2007 Appendectomy-1987 Carpal tunnel release right Hysterectomy partial -1972 Arthroscopy left shoulder-1999 Arthroscopy left knee-1992 Back surgery lumbar-1989 1999  Family History: Last updated: July 20, 2009 Father: alive Mother:deceased- anuerysm, cva  Siblings: no siblings No FH of Colon Cancer:  Social History: Last updated: 07/20/09 Marital Status: no Children: 4  Occupation: no Patient has never smoked.  Alcohol Use - no  Risk Factors: Smoking Status: never (2009-07-20)  Vital Signs:  Patient profile:   77 year old female Menstrual status:  postmenopausal Height:      62 inches Weight:      162 pounds BMI:     29.74 Temp:     97.4 degrees F oral Pulse rate:   68 / minute BP sitting:   120 / 78  (left arm) Cuff size:   regular  Vitals Entered By: Cloria Spring LPN (September 10, 2009 10:55 AM)  Physical Exam  General:  alert conversant no acute distress Lungs:  clear  to auscultation Heart:  regular and rhythm without murmur gallop rub  Impression & Recommendations: Impression:  very pleasant 77 year old lady with compelling evidence of H. pylori infection with relatively high antibody titer now status post treatment. Temporally, GI symptoms have completely resolved with treatment although her typical reflux symptoms continued be well controlled on omeprazole 40 mg orally daily. She does have an anemia, however. It's good she had colonoscopy not too long.  Recommendations: Will  retrieve Dr. Lovell Sheehan colonoscopy report with any pathology for review. We'll go ahead and send her home with one immunofecal occult blood testng kit. Further recommendations to follow.       Appended Document: Orders Update-charge    Clinical Lists Changes  Problems: Added new problem of HELICOBACTER PYLORI INFECTION (ICD-041.86) Added new problem of GERD (ICD-530.81) Orders: Added new Service order of Est. Patient Level III (98119) - Signed

## 2010-10-06 NOTE — Assessment & Plan Note (Signed)
Summary: complete physical   Vital Signs:  Patient profile:   77 year old female Menstrual status:  hysterectomy Height:      62 inches Weight:      165.25 pounds BMI:     30.33 O2 Sat:      95 % Pulse rate:   73 / minute Pulse rhythm:   regular Resp:     16 per minute BP sitting:   118 / 74  (left arm) Cuff size:   large  Vitals Entered By: Everitt Amber LPN (February 19, 2010 10:32 AM)  Nutrition Counseling: Patient's BMI is greater than 25 and therefore counseled on weight management options. CC: CPE   Vision Screening:Left eye with correction: 20 / 25 Right eye with correction: 20 / 40 Both eyes with correction: 20 / 25  Color vision testing: normal      Vision Entered By: Everitt Amber LPN (February 19, 2010 10:37 AM)   Primary Care Provider:  Lodema Hong  CC:  CPE .  History of Present Illness: Reports  that she has been doing fairly well. In the past2 weeks however, her mentally ill daughter has been a source of increased stress, however , she continues to manage with the support of hwer family and friends , without medication. she states her carpal tunnel sympts are worse, and isconsidering injection therapy, unable to contemplate surgery at this time dure to her responsibility for her father Denies recent fever or chills. Denies sinus pressure, nasal congestion , ear pain or sore throat. Denies chest congestion, or cough productive of sputum. Denies chest pain, palpitations, PND, orthopnea or leg swelling. Denies abdominal pain, nausea, vomitting, diarrhea or constipation. Denies change in bowel movements or bloody stool. Denies dysuria , frequency, incontinence or hesitancy.  Denies headaches, vertigo, seizures.  Denies  rash, lesions, or itch.     Current Medications (verified): 1)  Skelaxin 800 Mg  Tabs (Metaxalone) .... Take 1 Tablet By Mouth Three Times A Day 2)  Hydrocodone-Acetaminophen 7.5-750 Mg  Tabs (Hydrocodone-Acetaminophen) .... Take 1 Tablet By Mouth  Four Times A Day 3)  Lipitor 20 Mg  Tabs (Atorvastatin Calcium) .... Take 1 Tab By Mouth At Bedtime 4)  Oscal 500/200 D-3 500-200 Mg-Unit  Tabs (Calcium-Vitamin D) .... Take 1 Tablet By Mouth Three Times A Day 5)  Aspirin 81 Mg  Tbec (Aspirin) .... Take 1 Tablet By Mouth Once A Day 6)  Benazepril Hcl 40 Mg  Tabs (Benazepril Hcl) .... Take 1 Tablet By Mouth Once A Day 7)  Vitamin C 500 Mg  Tabs (Ascorbic Acid) .... Take One Tablets By Mouth Daily 8)  Nitroquick 0.4 Mg  Subl (Nitroglycerin) .... Use As Directed 9)  Centrum Silver   Tabs (Multiple Vitamins-Minerals) .... Take 1 Tablet By Mouth Once A Day 10)  Metoprolol Tartrate 25 Mg  Tabs (Metoprolol Tartrate) .... Take One Half Tablet By Mouth Two Times A Day 11)  Oxycodone-Acetaminophen 7.5-325 Mg Tabs (Oxycodone-Acetaminophen) .... One Tab By Mouth Qhs 12)  B-12 Dots 500 Mcg Subl (Cyanocobalamin) .... One Tab Under Tongue Once Daily 13)  Slow Release Iron 47.5 Mg Cr-Tabs (Ferrous Sulfate) .... One Tab By Mouth Qd 14)  Omeprazole 40 Mg Cpdr (Omeprazole) .... Take 1 Capsule By Mouth Once A Day 15)  Avalide 300-25 Mg Tabs (Irbesartan-Hydrochlorothiazide) .... Take 1 Tablet By Mouth Once A Day  Allergies (verified): 1)  ! Pcn 2)  ! Codeine 3)  ! Wellbutrin  Review of Systems  See HPI General:  Complains of fatigue. Eyes:  Denies blurring, discharge, double vision, eye pain, and red eye. GI:  Complains of constipation; hemmorhoids with constipation from iron. MS:  Complains of low back pain, mid back pain, and muscle weakness; progressive bilateral hand pain and weakness left worse than right interested in injections only at this time. Derm:  Complains of lesion(s) and rash; sevaeral tick bites, lesion of concern on right leg. Neuro:  Denies headaches, memory loss, poor balance, seizures, and sensation of room spinning. Psych:  Complains of anxiety; denies irritability, mental problems, panic attacks, suicidal thoughts/plans, thoughts of  violence, and unusual visions or sounds; pt has a significantamt of stress because of poor mental healthin her daughter who is indenial and refuses to take medication, she however manages without meds and intends to continue to do so at this time. Endo:  Denies cold intolerance, excessive thirst, excessive urination, and weight change. Heme:  Denies abnormal bruising and bleeding. Allergy:  Complains of seasonal allergies; denies hives or rash and itching eyes.  Physical Exam  General:  Well-developed,well-nourished,in no acute distress; alert,appropriate and cooperative throughout examination Head:  Normocephalic and atraumatic without obvious abnormalities. No apparent alopecia or balding. Eyes:  No corneal or conjunctival inflammation noted. EOMI. Perrla. Funduscopic exam benign, without hemorrhages, exudates or papilledema. Vision grossly normal. Ears:  External ear exam shows no significant lesions or deformities.  Otoscopic examination reveals clear canals, tympanic membranes are intact bilaterally without bulging, retraction, inflammation or discharge. Hearing is grossly normal bilaterally. Nose:  External nasal examination shows no deformity or inflammation. Nasal mucosa are pink and moist without lesions or exudates. Mouth:  Oral mucosa and oropharynx without lesions or exudates.  Teeth in good repair. Neck:  No deformities, masses, or tenderness noted. Chest Wall:  No deformities, masses, or tenderness noted. Breasts:  No mass, nodules, thickening, tenderness, bulging, retraction, inflamation, nipple discharge or skin changes noted.   Lungs:  Normal respiratory effort, chest expands symmetrically. Lungs are clear to auscultation, no crackles or wheezes. Heart:  Normal rate and regular rhythm. S1 and S2 normal without gallop, murmur, click, rub or other extra sounds. Abdomen:  Bowel sounds positive,abdomen soft and non-tender without masses, organomegaly or hernias noted. Rectal:  No  external abnormalities noted. Normal sphincter tone. No rectal masses or tenderness.guaic neg stool Genitalia:  normal introitus, no external lesions, mucosa pink and moist, and no adnexal masses or tenderness. Uterus absent  Msk:  No deformity or scoliosis noted of thoracic or lumbar spine.   Pulses:  R and L carotid,radial,femoral,dorsalis pedis and posterior tibial pulses are full and equal bilaterally Extremities:  decreased ROM thoracolumbar spine  with s[pasm, adequate in shoulders and knees.Bilateral positive tinnel's with weakness in both hands Neurologic:  No cranial nerve deficits noted. Station and gait are normal. Plantar reflexes are down-going bilaterally. DTRs are symmetrical throughout. Sensory, motor and coordinative functions appear intact. Skin:  Intact without suspicious lesions or rashes Cervical Nodes:  No lymphadenopathy noted Axillary Nodes:  No palpable lymphadenopathy Inguinal Nodes:  No significant adenopathy Psych:  Cognition and judgment appear intact. Alert and cooperative with normal attention span and concentration. No apparent delusions, illusions, hallucinations   Impression & Recommendations:  Problem # 1:  SPECIAL SCREENING FOR MALIGNANT NEOPLASMS COLON (ICD-V76.51) Assessment Comment Only  Orders: Hemoccult Guaiac-1 spec.(in office) (82270)  Problem # 2:  CARPAL TUNNEL SYNDROME, BILATERAL (ICD-354.0) Assessment: Deteriorated  Orders: Orthopedic Referral (Ortho)  Problem # 3:  GERD (ICD-530.81) Assessment: Improved  Her  updated medication list for this problem includes:    Omeprazole 40 Mg Cpdr (Omeprazole) .Marland Kitchen... Take 1 capsule by mouth once a day  Problem # 4:  BACK PAIN WITH RADICULOPATHY (ICD-729.2) Assessment: Unchanged  Problem # 5:  HYPERLIPIDEMIA (ICD-272.4) Assessment: Unchanged  Her updated medication list for this problem includes:    Lipitor 20 Mg Tabs (Atorvastatin calcium) .Marland Kitchen... Take 1 tab by mouth at  bedtime  Orders: T-Hepatic Function [id number]) T-Lipid Profile [id number])  Labs Reviewed: SGOT: 29 (11/28/2009)   SGPT: 19 (11/28/2009)   HDL:53 (11/28/2009), 39 (06/06/2009)  LDL:74 (11/28/2009), 74 (06/06/2009)  Chol:144 (11/28/2009), 146 (06/06/2009)  Trig:83 (11/28/2009), 166 (06/06/2009)  Problem # 6:  HYPERTENSION (ICD-401.9) Assessment: Improved  The following medications were removed from the medication list:    Avalide 300-25 Mg Tabs (Irbesartan-hydrochlorothiazide) .Marland Kitchen... Take one tab by mouth qd Her updated medication list for this problem includes:    Benazepril Hcl 40 Mg Tabs (Benazepril hcl) .Marland Kitchen... Take 1 tablet by mouth once a day    Metoprolol Tartrate 25 Mg Tabs (Metoprolol tartrate) .Marland Kitchen... Take one half tablet by mouth two times a day    Avalide 300-25 Mg Tabs (Irbesartan-hydrochlorothiazide) .Marland Kitchen... Take 1 tablet by mouth once a day  BP today: 118/74 Prior BP: 160/84 (12/01/2009)  Labs Reviewed: K+: 4.3 (02/16/2010) Creat: : 1.21 (02/16/2010)   Chol: 144 (11/28/2009)   HDL: 53 (11/28/2009)   LDL: 74 (11/28/2009)   TG: 83 (11/28/2009)  Complete Medication List: 1)  Skelaxin 800 Mg Tabs (Metaxalone) .... Take 1 tablet by mouth three times a day 2)  Hydrocodone-acetaminophen 7.5-750 Mg Tabs (Hydrocodone-acetaminophen) .... Take 1 tablet by mouth four times a day 3)  Lipitor 20 Mg Tabs (Atorvastatin calcium) .... Take 1 tab by mouth at bedtime 4)  Oscal 500/200 D-3 500-200 Mg-unit Tabs (Calcium-vitamin d) .... Take 1 tablet by mouth three times a day 5)  Aspirin 81 Mg Tbec (Aspirin) .... Take 1 tablet by mouth once a day 6)  Benazepril Hcl 40 Mg Tabs (Benazepril hcl) .... Take 1 tablet by mouth once a day 7)  Vitamin C 500 Mg Tabs (Ascorbic acid) .... Take one tablets by mouth daily 8)  Nitroquick 0.4 Mg Subl (Nitroglycerin) .... Use as directed 9)  Centrum Silver Tabs (Multiple vitamins-minerals) .... Take 1 tablet by mouth once a day 10)  Metoprolol Tartrate 25  Mg Tabs (Metoprolol tartrate) .... Take one half tablet by mouth two times a day 11)  Oxycodone-acetaminophen 7.5-325 Mg Tabs (Oxycodone-acetaminophen) .... One tab by mouth qhs 12)  B-12 Dots 500 Mcg Subl (Cyanocobalamin) .... One tab under tongue once daily 13)  Slow Release Iron 47.5 Mg Cr-tabs (Ferrous sulfate) .... One tab by mouth qd 14)  Omeprazole 40 Mg Cpdr (Omeprazole) .... Take 1 capsule by mouth once a day 15)  Avalide 300-25 Mg Tabs (Irbesartan-hydrochlorothiazide) .... Take 1 tablet by mouth once a day  Other Orders: T-CBC w/Diff ([id number]) T-Iron [phone]) T-Basic Metabolic Panel ([id number]) Pelvic & Breast Exam ( Medicare)  (F6433)  Patient Instructions: 1)  Please schedule a follow-up appointment in 4 months. 2)  It is important that you exercise regularly at least 20 minutes 5 times a week. If you develop chest pain, have severe difficulty breathing, or feel very tired , stop exercising immediately and seek medical attention. 3)  You need to lose weight. Consider a lower calorie diet and regular exercise.  4)  CBC w/ Diff prior to visit, ICD-9:, and anemia panel  if possible, if  not just check the iron level and seeif they can also get the discount if the ins will not cover 5)  Fasting lipid , hepatic and chem 7  in 4 months 6)  You will be referred to Dr Amanda Pea about the hands  Prescriptions: OXYCODONE-ACETAMINOPHEN 7.5-325 MG TABS (OXYCODONE-ACETAMINOPHEN) ONE TAB by mouth QHS  #30 x 0   Entered by:   Everitt Amber LPN   Authorized by:   Syliva Overman MD   Signed by:   Everitt Amber LPN on 81/19/1478   Method used:   Handwritten   RxID:   [id number] METOPROLOL TARTRATE 25 MG  TABS (METOPROLOL TARTRATE) Take one half tablet by mouth two times a day  #30 Tablet x 6   Entered by:   Everitt Amber LPN   Authorized by:   Syliva Overman MD   Signed by:   Everitt Amber LPN on 62/95/2841   Method used:   Electronically to        [facility] (retail)        [address]       [place], [place]  [address]       Ph: [phone]       Fax: [phone]   RxID:   [id number] BENAZEPRIL HCL 40 MG  TABS (BENAZEPRIL HCL) Take 1 tablet by mouth once a day  #30 Tablet x 6   Entered by:   Everitt Amber LPN   Authorized by:   Syliva Overman MD   Signed by:   Everitt Amber LPN on 16/60/6301   Method used:   Electronically to        [facility] (retail)       [address]       [place], [place]  [address]       Ph: [phone]       Fax: [phone]   RxID:   [id number] LIPITOR 20 MG  TABS (ATORVASTATIN CALCIUM) Take 1 tab by mouth at bedtime  #30 Tablet x 6   Entered by:   Everitt Amber LPN   Authorized by:   Syliva Overman MD   Signed by:   Everitt Amber LPN on 03/01/9484   Method used:   Electronically to        [facility] (retail)       [address]       [place], [place]  [address]       Ph: [phone]       Fax: [phone]   RxID:   [id number] SKELAXIN 800 MG  TABS (METAXALONE) Take 1 tablet by mouth three times a day  #90 Tablet x 6   Entered by:   Everitt Amber LPN   Authorized by:   Syliva Overman MD   Signed by:   Everitt Amber LPN on 78/24/2353   Method used:   Electronically to        [facility] [address] (retail)       [address]       [place], [place]  [address]       Ph: [phone]       Fax: [phone]   RxID:   [id number]   Laboratory Results    Stool - Occult Blood Hemmoccult #1: negative Date: 02/19/2010 Comments:  [id number] 8/11 118 10/12

## 2010-10-06 NOTE — Miscellaneous (Signed)
Summary: NARC REFILL  Clinical Lists Changes  Medications: Rx of OXYCODONE-ACETAMINOPHEN 7.5-325 MG TABS (OXYCODONE-ACETAMINOPHEN) ONE TAB by mouth QHS;  #30 x 0;  Signed;  Entered by: Everitt Amber;  Authorized by: Syliva Overman MD;  Method used: Handwritten    Prescriptions: OXYCODONE-ACETAMINOPHEN 7.5-325 MG TABS (OXYCODONE-ACETAMINOPHEN) ONE TAB by mouth QHS  #30 x 0   Entered by:   Everitt Amber   Authorized by:   Syliva Overman MD   Signed by:   Everitt Amber on 08/12/2009   Method used:   Handwritten   RxID:   [id number]

## 2010-10-06 NOTE — Progress Notes (Signed)
Summary: very important that you return her call  Phone Note Call from Patient   Summary of Call: she needs for you to call her it is very important [phone] Initial call taken by: Lind Guest,  March 19, 2009 8:22 AM  Follow-up for Phone Call        sPOKE DIRECTLY WITH THE PT, i NOTE THE SITUATION IS THAT SHE NEEDS TRHE OFFICE TO OK A RETURN TO DRi FOR AN EPIDURAL, SHE WOILD LIKE TO MAKE THE APPT HERSELF,REFERRAL [facility] HAS ALREADY TAKEN CARE OF THIS  AS WELL AS APOLOGIZING FOR SOME MISUNDERSTANDING Follow-up by: Syliva Overman MD,  March 19, 2009 2:29 PM

## 2010-10-06 NOTE — Miscellaneous (Signed)
Summary: NARC REFILL  Clinical Lists Changes  Medications: Rx of OXYCODONE-ACETAMINOPHEN 7.5-325 MG TABS (OXYCODONE-ACETAMINOPHEN) ONE TAB by mouth QHS;  #30 x 0;  Signed;  Entered by: Everitt Amber;  Authorized by: Syliva Overman MD;  Method used: Handwritten    Prescriptions: OXYCODONE-ACETAMINOPHEN 7.5-325 MG TABS (OXYCODONE-ACETAMINOPHEN) ONE TAB by mouth QHS  #30 x 0   Entered by:   Everitt Amber   Authorized by:   Syliva Overman MD   Signed by:   Everitt Amber on 09/15/2009   Method used:   Handwritten   RxID:   [id number]

## 2010-10-06 NOTE — Medication Information (Signed)
Summary: Tax adviser   Imported By: Lind Guest 08/27/2008 15:27:36  _____________________________________________________________________  External Attachment:    Type:   Image     Comment:   External Document

## 2010-10-06 NOTE — Assessment & Plan Note (Signed)
Summary: office visit   Allergies: 1)  ! Pcn 2)  ! Codeine 3)  ! Wellbutrin   Complete Medication List: 1)  Skelaxin 800 Mg Tabs (Metaxalone) .... Take 1 tablet by mouth three times a day 2)  Hydrocodone-acetaminophen 7.5-750 Mg Tabs (Hydrocodone-acetaminophen) .... Take 1 tablet by mouth four times a day 3)  Lipitor 20 Mg Tabs (Atorvastatin calcium) .... Take 1 tab by mouth at bedtime 4)  Oscal 500/200 D-3 500-200 Mg-unit Tabs (Calcium-vitamin d) .... Take 1 tablet by mouth three times a day 5)  Aspirin 81 Mg Tbec (Aspirin) .... Take 1 tablet by mouth once a day 6)  Avalide 300-25 Mg Tabs (Irbesartan-hydrochlorothiazide) .... Take one tab by mouth qd 7)  Benazepril Hcl 40 Mg Tabs (Benazepril hcl) .... Take 1 tablet by mouth once a day 8)  Vitamin C 500 Mg Tabs (Ascorbic acid) .... Take one tablets by mouth daily 9)  Nitroquick 0.4 Mg Subl (Nitroglycerin) .... Use as directed 10)  Centrum Silver Tabs (Multiple vitamins-minerals) .... Take 1 tablet by mouth once a day 11)  Metoprolol Tartrate 25 Mg Tabs (Metoprolol tartrate) .... Take one half tablet by mouth two times a day 12)  Clonidine Hcl 0.2 Mg Tabs (Clonidine hcl) .... One tab by mouth bid 13)  Omeprazole 20 Mg Tbec (Omeprazole) .... Take 1 tablet by mouth two times a day 14)  Oxycodone-acetaminophen 7.5-325 Mg Tabs (Oxycodone-acetaminophen) .... One tab by mouth qhs 15)  B-12 Dots 500 Mcg Subl (Cyanocobalamin) .... One tab under tongue once daily 16)  Carafate 1 Gm Tabs (Sucralfate) .... One tab by mouth ac and qhs  Pt. left today without being seen she recently saw card  Elsie Lincoln 04/14 was told every thing was fine no med changes except for NTG to [place]. Pt states she sometimes get her BP as high as 190, she is willing to return for a complimentary bP check, states she did not mean to be rude, spoke twice to the check in staff member before leaving. She statesshe will reschedwhen ready, after reviewing her labsin light of her recent  card eval I told her thiswas ok, and that i was sorry on behalf of the staff and would have this addressed

## 2010-10-06 NOTE — Letter (Signed)
Summary: allergy & asthma sinus  allergy & asthma sinus   Imported By: Lind Guest 08/11/2010 08:53:03  _____________________________________________________________________  External Attachment:    Type:   Image     Comment:   External Document

## 2010-10-06 NOTE — Assessment & Plan Note (Signed)
Summary: OV   Vital Signs:  Patient profile:   77 year old female Menstrual status:  postmenopausal Height:      59.5 inches Weight:      166.56 pounds BMI:     33.20 Pulse rate:   58 / minute Pulse rhythm:   regular Resp:     16 per minute BP sitting:   120 / 70  (left arm)  Vitals Entered By: Worthy Keeler LPN (June 12, 2009 10:31 AM)  Nutrition Counseling: Patient's BMI is greater than 25 and therefore counseled on weight management options. CC: follow-up visit Is Patient Diabetic? No Pain Assessment Patient in pain? yes     Location: back Intensity: 8 Type: aching Onset of pain  Constant     Menstrual Status postmenopausal Last PAP Result Normal   CC:  follow-up visit.  History of Present Illness: Reports  that she has been doping fairly well. Care of her father continues toconsumeher mentally and physically and she would have it no other way. Denies recent fever or chills. Denies sinus pressure, nasal congestion , ear pain or sore throat. Denies chest congestion, or cough productive of sputum. Denies chest pain, palpitations, PND, orthopnea or leg swelling.Denies abdominal pain, nausea, vomitting,  Denies change in bowel movements or bloody stool.Denies visible rectal blood. Denies dysuria , frequency, incontinence or hesitancy. Denies  joint pain, swelling, or reduced mobility.However reports increased and uncontrolled back pain, will wait on another epidural at this time. Denies headaches, vertigo, seizures. Reports inc stress, anxiety and depression, not only because of her father's failing health , but because her daughter who is mentally ill, and who refuses to take medication returned home last night after disappearing for several weeks. Denies  rash, lesions, or itch.     Current Medications (verified): 1)  Skelaxin 800 Mg  Tabs (Metaxalone) .... Take 1 Tablet By Mouth Three Times A Day 2)  Hydrocodone-Acetaminophen 7.5-750 Mg  Tabs  (Hydrocodone-Acetaminophen) .... Take 1 Tablet By Mouth Four Times A Day 3)  Lipitor 20 Mg  Tabs (Atorvastatin Calcium) .... Take 1 Tab By Mouth At Bedtime 4)  Oscal 500/200 D-3 500-200 Mg-Unit  Tabs (Calcium-Vitamin D) .... Take 1 Tablet By Mouth Three Times A Day 5)  Aspirin 81 Mg  Tbec (Aspirin) .... Take 1 Tablet By Mouth Once A Day 6)  Avalide 300-25 Mg  Tabs (Irbesartan-Hydrochlorothiazide) .... Take One Tab By Mouth Qd 7)  Benazepril Hcl 40 Mg  Tabs (Benazepril Hcl) .... Take 1 Tablet By Mouth Once A Day 8)  Vitamin C 500 Mg  Tabs (Ascorbic Acid) .... Take One Tablets By Mouth Daily 9)  Nitroquick 0.4 Mg  Subl (Nitroglycerin) .... Use As Directed 10)  Centrum Silver   Tabs (Multiple Vitamins-Minerals) .... Take 1 Tablet By Mouth Once A Day 11)  Metoprolol Tartrate 25 Mg  Tabs (Metoprolol Tartrate) .... Take One Half Tablet By Mouth Two Times A Day 12)  Clonidine Hcl 0.2 Mg  Tabs (Clonidine Hcl) .... One Tab By Mouth Bid 13)  Oxycodone-Acetaminophen 7.5-325 Mg Tabs (Oxycodone-Acetaminophen) .... One Tab By Mouth Qhs 14)  B-12 Dots 500 Mcg Subl (Cyanocobalamin) .... One Tab Under Tongue Once Daily 15)  Carafate 1 Gm Tabs (Sucralfate) .... One Tab By Mouth Ac and Qhs 16)  Slow Release Iron 47.5 Mg Cr-Tabs (Ferrous Sulfate) .... One Tab By Mouth Qd 17)  Omeprazole 40 Mg Cpdr (Omeprazole) .... Take 1 Capsule By Mouth Once A Day  Allergies (verified): 1)  !  Pcn 2)  ! Codeine 3)  ! Wellbutrin  Review of Systems      See HPI GI:  Complains of abdominal pain; increased burning  in the stomach has been drinking excessive caffeine. MS:  Complains of low back pain and mid back pain; increased back pain, not readt to go for epidurals at thistime. Psych:  Complains of anxiety, depression, and easily tearful; denies suicidal thoughts/plans, thoughts of violence, and unusual visions or sounds; father was recently hospitalised and in ICU, she continues to worry abouthis passing and states shhe is  getting more anxious about this. She reportssupport from hwer family, and she does craftwork like flower arrangements that keeps  her mind busy, no interest in therapy.  Physical Exam  General:  Well-developed,well-nourished,in no acute distress; alert,appropriate and cooperative throughout examination ,HEENT: No facial asymmetry,  EOMI, No sinus tenderness, TM's Clear, oropharynx  pink and moist.   Chest: Clear to auscultation bilaterally.  CVS: S1, S2, No murmurs, No S3.   Abd: Soft, Nontender.  MS: Decreased ROM spine,adequate inhips, shoulders and knees.  Ext: No edema.   CNS: CN 2-12 intact, power tone and sensation normal throughout.   Skin: Intact, no visible lesions or rashes.  Psych: Good eye contact, normal affect.  Memory intact, not anxious or depressed appearing.    Impression & Recommendations:  Problem # 1:  UNSPECIFIED ANEMIA (ICD-285.9) Assessment Comment Only  Orders: T-CBC w/Diff ([address]-60454) T-CBC w/Diff (09811-91478)  Her updated medication list for this problem includes:    B-12 Dots 500 Mcg Subl (Cyanocobalamin) ..... One tab under tongue once daily    Slow Release Iron 47.5 Mg Cr-tabs (Ferrous sulfate) ..... One tab by mouth qd  Hgb: 10.0 (06/06/2009)   Hct: 32.4 (06/06/2009)   Platelets: 233 (06/06/2009) RBC: 3.55 (06/06/2009)   RDW: 13.1 (06/06/2009)   WBC: 4.5 (06/06/2009) MCV: 91.3 (06/06/2009)   MCHC: 30.9 (06/06/2009) Retic Ct: 46.2 K/uL (06/06/2009)   Ferritin: 198 (06/06/2009) Iron: 60 (06/06/2009)   TIBC: 272 (06/06/2009)   % Sat: 22 (06/06/2009), FOB cards x 3 given also  Problem # 2:  BACK PAIN WITH RADICULOPATHY (ICD-729.2) Assessment: Deteriorated  Problem # 3:  HYPERTENSION (ICD-401.9) Assessment: Improved  Her updated medication list for this problem includes:    Avalide 300-25 Mg Tabs (Irbesartan-hydrochlorothiazide) .Marland Kitchen... Take one tab by mouth qd    Benazepril Hcl 40 Mg Tabs (Benazepril hcl) .Marland Kitchen... Take 1 tablet by mouth once a  day    Metoprolol Tartrate 25 Mg Tabs (Metoprolol tartrate) .Marland Kitchen... Take one half tablet by mouth two times a day    Clonidine Hcl 0.2 Mg Tabs (Clonidine hcl) ..... One tab by mouth bid  BP today: 120/70 Prior BP: 150/90 (10/01/2008)  Labs Reviewed: K+: 4.3 (06/06/2009) Creat: : 1.32 (06/06/2009)   Chol: 146 (06/06/2009)   HDL: 39 (06/06/2009)   LDL: 74 (06/06/2009)   TG: 166 (06/06/2009)  Problem # 4:  HYPERLIPIDEMIA (ICD-272.4) Assessment: Deteriorated  Her updated medication list for this problem includes:    Lipitor 20 Mg Tabs (Atorvastatin calcium) .Marland Kitchen... Take 1 tab by mouth at bedtime  Orders: T-Hepatic Function [phone]) T-Lipid Profile [phone])  Labs Reviewed: SGOT: 18 (06/06/2009)   SGPT: 11 (06/06/2009)   HDL:39 (06/06/2009), 41 (12/20/2008)  LDL:74 (06/06/2009), 65 (12/20/2008)  Chol:146 (06/06/2009), 133 (12/20/2008)  Trig:166 (06/06/2009), 136 (12/20/2008), low fat diet encouraged  Complete Medication List: 1)  Skelaxin 800 Mg Tabs (Metaxalone) .... Take 1 tablet by mouth three times a day 2)  Hydrocodone-acetaminophen 7.5-750  Mg Tabs (Hydrocodone-acetaminophen) .... Take 1 tablet by mouth four times a day 3)  Lipitor 20 Mg Tabs (Atorvastatin calcium) .... Take 1 tab by mouth at bedtime 4)  Oscal 500/200 D-3 500-200 Mg-unit Tabs (Calcium-vitamin d) .... Take 1 tablet by mouth three times a day 5)  Aspirin 81 Mg Tbec (Aspirin) .... Take 1 tablet by mouth once a day 6)  Avalide 300-25 Mg Tabs (Irbesartan-hydrochlorothiazide) .... Take one tab by mouth qd 7)  Benazepril Hcl 40 Mg Tabs (Benazepril hcl) .... Take 1 tablet by mouth once a day 8)  Vitamin C 500 Mg Tabs (Ascorbic acid) .... Take one tablets by mouth daily 9)  Nitroquick 0.4 Mg Subl (Nitroglycerin) .... Use as directed 10)  Centrum Silver Tabs (Multiple vitamins-minerals) .... Take 1 tablet by mouth once a day 11)  Metoprolol Tartrate 25 Mg Tabs (Metoprolol tartrate) .... Take one half tablet by mouth  two times a day 12)  Clonidine Hcl 0.2 Mg Tabs (Clonidine hcl) .... One tab by mouth bid 13)  Oxycodone-acetaminophen 7.5-325 Mg Tabs (Oxycodone-acetaminophen) .... One tab by mouth qhs 14)  B-12 Dots 500 Mcg Subl (Cyanocobalamin) .... One tab under tongue once daily 15)  Carafate 1 Gm Tabs (Sucralfate) .... One tab by mouth ac and qhs 16)  Slow Release Iron 47.5 Mg Cr-tabs (Ferrous sulfate) .... One tab by mouth qd 17)  Omeprazole 40 Mg Cpdr (Omeprazole) .... Take 1 capsule by mouth once a day  Other Orders: Influenza Vaccine MCR ([address]) T-Basic Metabolic Panel ([id number]) T- * Misc. Laboratory test [phone]) T-BUN [id number]) T-Creatinine Blood [phone])  Patient Instructions: 1)  F/u in 5 .5 months,. 2)  It is important that you exercise regularly at least30 minutes 6 times a week. If you develop chest pain, have severe difficulty breathing, or feel very tired , stop exercising immediately and seek medical attention. 3)  You need to lose weight. Consider a lower calorie diet and regular exercise.  4)  CBC w/ Diff prior to visit, ICD-9:  and anemia panel and bUN and creatinine, first week in the [facility]. 5)  Schedule your mammogram.pls sched in december. 6)  BMP prior to visit, ICD-9: 7)  Hepatic Panel prior to visit, ICD-9: 8)  Lipid Panel prior to visit, ICD-9: 9)  CBC w/ Diff prior to visit, ICD-9: Prescriptions: CLONIDINE HCL 0.2 MG  TABS (CLONIDINE HCL) one tab by mouth bid  #60 x 6   Entered by:   Everitt Amber   Authorized by:   Syliva Overman MD   Signed by:   Everitt Amber on 06/12/2009   Method used:   Historical   RxID:   [id number] HYDROCODONE-ACETAMINOPHEN 7.5-750 MG  TABS (HYDROCODONE-ACETAMINOPHEN) Take 1 tablet by mouth four times a day  #120 x 4   Entered by:   Everitt Amber   Authorized by:   Syliva Overman MD   Signed by:   Everitt Amber on 06/12/2009   Method used:   Printed then faxed to ...       [facility] (retail)       [address]       [place], [place]  [address]       Ph: [phone]       Fax: [phone]   RxID:   [id number] CARAFATE 1 GM TABS (SUCRALFATE) one tab by mouth ac and qhs  #120 Tablet x 6   Entered by:   Everitt Amber  Authorized by:   Syliva Overman MD   Signed by:   Everitt Amber on 06/12/2009   Method used:   Electronically to        [facility] (retail)       [address]       [place], [place]  [address]       Ph: [phone]       Fax: [phone]   RxID:   [id number] AVALIDE 300-25 MG  TABS (IRBESARTAN-HYDROCHLOROTHIAZIDE) take one tab by mouth qd  #30 Tablet x 6   Entered by:   Everitt Amber   Authorized by:   Syliva Overman MD   Signed by:   Everitt Amber on 06/12/2009   Method used:   Electronically to        [facility] (retail)       [address]       [place], [place]  [address]       Ph: [phone]       Fax: [phone]   RxID:   [id number] CLONIDINE HCL 0.2 MG  TABS (CLONIDINE HCL) one tab by mouth bid  #30 Tablet x 6   Entered by:   Everitt Amber   Authorized by:   Syliva Overman MD   Signed by:   Everitt Amber on 06/12/2009   Method used:   Electronically to        [facility] (retail)       [address]       [place], [place]  [address]       Ph: [phone]       Fax: [phone]   RxID:   [id number] OMEPRAZOLE 40 MG CPDR (OMEPRAZOLE) Take 1 capsule by mouth once a day  #31 x 5   Entered by:   Everitt Amber   Authorized by:   Syliva Overman MD   Signed by:   Everitt Amber on 06/12/2009   Method used:   Electronically to        [facility] (retail)       [address]       [place], [place]  [address]       Ph: [phone]       Fax: [phone]   RxID:   [id number] BENAZEPRIL HCL 40 MG  TABS (BENAZEPRIL HCL) Take 1 tablet by mouth once a day  #30 Tablet x 6   Entered by:    Everitt Amber   Authorized by:   Syliva Overman MD   Signed by:   Everitt Amber on 06/12/2009   Method used:   Electronically to        [facility] (retail)       [address]       [place], [place]  [address]       Ph: [phone]       Fax: [phone]   RxID:   [id number] LIPITOR 20 MG  TABS (ATORVASTATIN CALCIUM) Take 1 tab by mouth at bedtime  #30 Tablet x 6   Entered by:   Everitt Amber   Authorized by:   Syliva Overman MD   Signed by:   Everitt Amber on 06/12/2009   Method used:  Electronically to        [facility] (retail)       [address]       [place], [place]  [address]       Ph: [phone]       Fax: [phone]   RxID:   [id number] SKELAXIN 800 MG  TABS (METAXALONE) Take 1 tablet by mouth three times a day  #90 Tablet x 6   Entered by:   Everitt Amber   Authorized by:   Syliva Overman MD   Signed by:   Everitt Amber on 06/12/2009   Method used:   Electronically to        [facility] (retail)       [address]       [place], [place]  [address]       Ph: [phone]       Fax: [phone]   RxID:   [id number] [facility] OMEPRAZOLE 20 MG TBEC (OMEPRAZOLE) Take 1 capsule by mouth once a day  #30 x 0   Entered and Authorized by:   Syliva Overman MD   Signed by:   Syliva Overman MD on 06/12/2009   Method used:   Electronically to        [facility] (retail)       [address]       [place], [place]  [address]       Ph: [phone]       Fax: [phone]   RxID:   [id number] OMEPRAZOLE 40 MG CPDR (OMEPRAZOLE) Take 1 capsule by mouth once a day  #31 x 5   Entered and Authorized by:   Syliva Overman MD   Signed by:   Syliva Overman MD on 06/12/2009   Method used:   Electronically to        [facility] (retail)       [address]       [place], [place]  [address]        Ph: [phone]       Fax: [phone]   RxID:   [id number]    Influenza Vaccine    Vaccine Type: Fluvax MCR    Site: left deltoid    Mfr: novartis    Dose: 0.5 ml    Route: IM    Given by: Worthy Keeler LPN    Exp. Date: 04/11    Lot #: [id number]    VIS given: 03/30/07 version given June 12, 2009.    Laboratory Results  Date/Time Received: 06/19/09 Date/Time Reported: 06/19/09  Stool - Occult Blood Hemmoccult #1: negative Date: 06/17/2009 Hemoccult #2: negative Date: 06/18/2009 Hemoccult #3: negative Date: 06/19/2009 Comments: [id number] 6L 6/11 118 10/12   Appended Document: OV I need the result of her stool cards documented in the chart as well as evidence that she was notified, of the reslts either by [employer] telephone, this is VERY important, pls get back with me on this on mOnday

## 2010-10-06 NOTE — Miscellaneous (Signed)
Summary: narc refill  Clinical Lists Changes  Medications: Rx of OXYCODONE-ACETAMINOPHEN 7.5-325 MG TABS (OXYCODONE-ACETAMINOPHEN) ONE TAB by mouth QHS;  #30 x 0;  Signed;  Entered by: Everitt Amber;  Authorized by: Syliva Overman MD;  Method used: Printed then mailed to [facility] [address] [place], [place], [place]  [address], Ph: [phone], Fax: [phone]    Prescriptions: OXYCODONE-ACETAMINOPHEN 7.5-325 MG TABS (OXYCODONE-ACETAMINOPHEN) ONE TAB by mouth QHS  #30 x 0   Entered by:   Everitt Amber   Authorized by:   Syliva Overman MD   Signed by:   Everitt Amber on 08/04/2009   Method used:   Printed then mailed to ...       [facility] (retail)       [address]       [place], [place]  [address]       Ph: [phone]       Fax: [phone]   RxID:   [id number]

## 2010-10-06 NOTE — Miscellaneous (Signed)
Summary: narc refill  Clinical Lists Changes  Medications: Rx of OXYCODONE-ACETAMINOPHEN 7.5-325 MG TABS (OXYCODONE-ACETAMINOPHEN) ONE TAB by mouth QHS;  #30 x 0;  Signed;  Entered by: Worthy Keeler LPN;  Authorized by: Syliva Overman MD;  Method used: Handwritten    Prescriptions: OXYCODONE-ACETAMINOPHEN 7.5-325 MG TABS (OXYCODONE-ACETAMINOPHEN) ONE TAB by mouth QHS  #30 x 0   Entered by:   Worthy Keeler LPN   Authorized by:   Syliva Overman MD   Signed by:   Worthy Keeler LPN on 24/40/1027   Method used:   Handwritten   RxID:   [id number]

## 2010-10-06 NOTE — Progress Notes (Signed)
Summary: pick up  Phone Note Call from Patient   Summary of Call: wants [facility]en [facility] to pick up rx tomorrow at [facility] Initial call taken by: Lind Guest,  July 24, 2008 1:44 PM  Follow-up for Phone Call        prescription available for pickup Follow-up by: Worthy Keeler LPN,  July 24, 2008 2:04 PM

## 2010-10-06 NOTE — Miscellaneous (Signed)
Summary: NARC REFILL  Clinical Lists Changes  Medications: Rx of OXYCODONE-ACETAMINOPHEN 7.5-325 MG TABS (OXYCODONE-ACETAMINOPHEN) ONE TAB by mouth QHS;  #30 x 0;  Signed;  Entered by: Everitt Amber LPN;  Authorized by: Syliva Overman MD;  Method used: Handwritten    Prescriptions: OXYCODONE-ACETAMINOPHEN 7.5-325 MG TABS (OXYCODONE-ACETAMINOPHEN) ONE TAB by mouth QHS  #30 x 0   Entered by:   Everitt Amber LPN   Authorized by:   Syliva Overman MD   Signed by:   Everitt Amber LPN on 16/06/9603   Method used:   Handwritten   RxID:   [id number]

## 2010-10-06 NOTE — Progress Notes (Signed)
Summary: back brace  Phone Note Call from Patient   Summary of Call: wants to talk with dr. Lodema Hong about back brace. [phone] Initial call taken by: Rudene Anda,  October 02, 2008 11:42 AM  Follow-up for Phone Call        advise her she can have one, I will write the rx to be sent in to her pharmacy Follow-up by: Syliva Overman MD,  October 02, 2008 5:03 PM  Additional Follow-up for Phone Call Additional follow up Details #1::        Phone Call Completed, Rx Called In Additional Follow-up by: Worthy Keeler LPN,  October 03, 2008 8:41 AM

## 2010-10-06 NOTE — Progress Notes (Signed)
Summary: returning your call  Phone Note Call from Patient   Summary of Call: returning your call from friday please call back at [phone] Initial call taken by: Lind Guest,  February 23, 2010 8:53 AM  Follow-up for Phone Call        Patient aware of previous results per append Follow-up by: Everitt Amber LPN,  February 23, 2010 9:05 AM

## 2010-10-06 NOTE — Miscellaneous (Signed)
Summary: Home Care Report  Home Care Report   Imported By: Lind Guest 07/08/2010 08:45:20  _____________________________________________________________________  External Attachment:    Type:   Image     Comment:   External Document

## 2010-10-06 NOTE — Medication Information (Signed)
Summary: Tax adviser   Imported By: Lind Guest 07/14/2009 11:09:04  _____________________________________________________________________  External Attachment:    Type:   Image     Comment:   External Document

## 2010-10-06 NOTE — Medication Information (Signed)
Summary: Tax adviser   Imported By: Lind Guest 12/31/2008 15:38:07  _____________________________________________________________________  External Attachment:    Type:   Image     Comment:   External Document

## 2010-10-06 NOTE — Miscellaneous (Signed)
Summary: NARC REFILL  Clinical Lists Changes  Medications: Rx of OXYCODONE-ACETAMINOPHEN 7.5-325 MG TABS (OXYCODONE-ACETAMINOPHEN) ONE TAB by mouth QHS;  #30 x 0;  Signed;  Entered by: Everitt Amber LPN;  Authorized by: Syliva Overman MD;  Method used: Printed then faxed to [facility] [address], [place], [place], [place]  [address], Ph: [phone], Fax: [phone]    Prescriptions: OXYCODONE-ACETAMINOPHEN 7.5-325 MG TABS (OXYCODONE-ACETAMINOPHEN) ONE TAB by mouth QHS  #30 x 0   Entered by:   Everitt Amber LPN   Authorized by:   Syliva Overman MD   Signed by:   Everitt Amber LPN on 78/46/9629   Method used:   Printed then faxed to ...       [facility] (retail)       [address]       [place], [place]  [address]       Ph: [phone]       Fax: [phone]   RxID:   [id number]

## 2010-10-06 NOTE — Progress Notes (Signed)
Summary: CALL  Phone Note Call from Patient   Summary of Call: NEEDS FOR YOU TO CALL HER AFTER WORK AT [phone] Initial call taken by: Lind Guest,  March 31, 2009 4:22 PM  Follow-up for Phone Call        pls authorise epidurals to be administered as often as needed per pt and pain clinic arrangement. She states that only one was authorised and is requesting an open authorization, which is fine by me, any questions, PlS ask me.  and pls send me a msg when you get this done. (she had a shot today)  Follow-up by: Syliva Overman MD,  March 31, 2009 5:18 PM  Additional Follow-up for Phone Call Additional follow up Details #1::        called judy at [facility] injections and faxed her over another order stating pt could get injections as needed. Also called pt and she was okay with this. pt aware  Additional Follow-up by: Rudene Anda,  April 01, 2009 3:13 PM

## 2010-10-06 NOTE — Progress Notes (Signed)
Summary: rx  Phone Note Call from Patient   Summary of Call: needs a rx for oxcod. [phone] Initial call taken by: Rudene Anda,  August 22, 2008 11:06 AM  Follow-up for Phone Call        Returned call, left message Follow-up by: Everitt Amber,  August 22, 2008 11:10 AM  Additional Follow-up for Phone Call Additional follow up Details #1::        RX ready to be picked up Additional Follow-up by: Everitt Amber,  August 22, 2008 11:11 AM

## 2010-10-06 NOTE — Progress Notes (Signed)
Summary: rx  Phone Note Call from Patient   Summary of Call: wants a rx for oxy helen to pick up. [phone] Initial call taken by: Rudene Anda,  April 28, 2010 11:45 AM  Follow-up for Phone Call        patient aware script available and okay if helen picks up Follow-up by: Adella Hare LPN,  April 28, 2010 4:06 PM

## 2010-10-06 NOTE — Letter (Signed)
[facility]   Imported By: Lind Guest 07/07/2010 08:42:59  _____________________________________________________________________  External Attachment:    Type:   Image     Comment:   External Document

## 2010-10-06 NOTE — Medication Information (Signed)
Summary: Tax adviser   Imported By: Lind Guest 10/03/2008 16:02:50  _____________________________________________________________________  External Attachment:    Type:   Image     Comment:   External Document

## 2010-10-06 NOTE — Medication Information (Signed)
Summary: Tax adviser   Imported By: Lind Guest 10/31/2008 13:53:21  _____________________________________________________________________  External Attachment:    Type:   Image     Comment:   External Document

## 2010-10-06 NOTE — Letter (Signed)
Summary: [facility]  [facility]   Imported By: Ricard Dillon 10/13/2009 15:04:10  _____________________________________________________________________  External Attachment:    Type:   Image     Comment:   External Document

## 2010-10-06 NOTE — Letter (Signed)
Summary: EGD ORDER  EGD ORDER   Imported By: Ave Filter 07/10/2009 15:01:41  _____________________________________________________________________  External Attachment:    Type:   Image     Comment:   External Document

## 2010-10-06 NOTE — Assessment & Plan Note (Signed)
Summary: office visit   Vital Signs:  Patient Profile:   77 Years Old Female Height:     59.5 inches (151.13 cm) Weight:      164 pounds (74.55 kg) BMI:     32.69 BSA:     1.71 Pulse rate:   70 / minute Pulse rhythm:   regular Resp:     16 per minute BP sitting:   150 / 90  (left arm)  Pt. in pain?   yes    Location:   back    Intensity:   8    Type:       aching  Vitals Entered By: Worthy Keeler LPN (October 01, 2008 1:01 PM)                  Chief Complaint:  follow up chronic problems.  History of Present Illness: the pt reports increased and uncontrolled back pain. she dnies any new aggravating factors, she plansd to continue epidurals as needed. She denies any recent fever or chills. She denies depression, she does have anxiety as she cares for her father with severe dementia and deals with a daughter will mental health illness who is unmedicated. She denies head or chest congestion.  She denies difficulty with bowel movements. She denies urinary symptoms.    Updated Prior Medication List: SKELAXIN 800 MG  TABS (METAXALONE) Take 1 tablet by mouth three times a day HYDROCODONE-ACETAMINOPHEN 7.5-750 MG  TABS (HYDROCODONE-ACETAMINOPHEN) Take 1 tablet by mouth four times a day LIPITOR 20 MG  TABS (ATORVASTATIN CALCIUM) Take 1 tab by mouth at bedtime OSCAL 500/200 D-3 500-200 MG-UNIT  TABS (CALCIUM-VITAMIN D) Take 1 tablet by mouth three times a day ASPIRIN 81 MG  TBEC (ASPIRIN) Take 1 tablet by mouth once a day AVALIDE 300-25 MG  TABS (IRBESARTAN-HYDROCHLOROTHIAZIDE) Take two tablets daily by mouth BENAZEPRIL HCL 40 MG  TABS (BENAZEPRIL HCL) Take 1 tablet by mouth once a day VITAMIN C 500 MG  TABS (ASCORBIC ACID) Take two tablets by mouth daily NITROQUICK 0.4 MG  SUBL (NITROGLYCERIN) Use as directed CENTRUM SILVER   TABS (MULTIPLE VITAMINS-MINERALS) Take 1 tablet by mouth once a day METOPROLOL TARTRATE 25 MG  TABS (METOPROLOL TARTRATE) Take 1 tablet by mouth two  times a day CLONIDINE HCL 0.2 MG  TABS (CLONIDINE HCL) one tab by mouth at bedtime OMEPRAZOLE 20 MG  TBEC (OMEPRAZOLE) Take 1 tablet by mouth two times a day OXYCODONE-ACETAMINOPHEN 7.5-325 MG TABS (OXYCODONE-ACETAMINOPHEN) ONE TAB by mouth QHS B-12 DOTS 500 MCG SUBL (CYANOCOBALAMIN) One tab under tongue once daily CARAFATE 1 GM TABS (SUCRALFATE) one tab by mouth ac and qhs RANITIDINE HCL 150 MG TABS (RANITIDINE HCL) One tab by mouth two times a day for 2 weeks PEPTIC RELIEF 262 MG TABS (BISMUTH SUBSALICYLATE) Take 2 tabs by mouth four times daily for 2 weeks  Current Allergies: ! PCN ! CODEINE ! WELLBUTRIN     Review of Systems  General      Denies chills, fatigue, fever, loss of appetite, malaise, sleep disorder, sweats, weakness, and weight loss.  ENT      Denies earache, hoarseness, nasal congestion, sinus pressure, and sore throat.  CV      Denies chest pain or discomfort, palpitations, shortness of breath with exertion, and swelling of hands.      needs appt with a new cardiologist for continued care  Resp      Denies cough, sputum productive, and wheezing.  GI  Denies abdominal pain, constipation, diarrhea, nausea, and vomiting.  GU      Denies dysuria and urinary frequency.  MS      See HPI  Derm      Denies itching, lesion(s), and rash.  Psych      See HPI      Complains of easily angered, easily tearful, and irritability.      Denies suicidal thoughts/plans, thoughts of violence, and unusual visions or sounds.  Allergy      Denies hives or rash and sneezing.   Physical Exam  General:     HEENT: No facial asymmetry,  EOMI, No sinus tenderness, TM's Clear, oropharynx  pink and moist.   Chest: Clear to auscultation bilaterally.  CVS: S1, S2, No murmurs, No S3.   Abd: Soft, Nontender.  MS: Decreased ROM spine, hips, shoulders and knees.  Ext: No edema.   CNS: CN 2-12 intact, power tone and sensation normal throughout.   Skin: Intact, no  visible lesions or rashes.  Psych: Good eye contact, normal effect.  Memory intact, not anxious or depressed appearing.      Impression & Recommendations:  Problem # 1:  BACK PAIN WITH RADICULOPATHY (ICD-729.2) Assessment: Unchanged  Problem # 2:  HYPERTENSION (ICD-401.9) Assessment: Deteriorated  Her updated medication list for this problem includes:    Avalide 300-25 Mg Tabs (Irbesartan-hydrochlorothiazide) .Marland Kitchen... Take one tab by mouth qd    Benazepril Hcl 40 Mg Tabs (Benazepril hcl) .Marland Kitchen... Take 1 tablet by mouth once a day    Metoprolol Tartrate 25 Mg Tabs (Metoprolol tartrate) .Marland Kitchen... Take one half tablet by mouth two times a day    Clonidine Hcl 0.2 Mg Tabs (Clonidine hcl) ..... One tab by mouth bid  Orders: T-Basic Metabolic Panel [id number])  BP today: 150/90 Prior BP: 128/80 (07/03/2008)  Labs Reviewed: Creat: 1.10 (06/28/2008) Chol: 132 (06/28/2008)   HDL: 57 (06/28/2008)   LDL: 62 (06/28/2008)   TG: 63 (06/28/2008)   Problem # 3:  CAD (ICD-414.00) Assessment: Comment Only  Her updated medication list for this problem includes:    Aspirin 81 Mg Tbec (Aspirin) .Marland Kitchen... Take 1 tablet by mouth once a day    Avalide 300-25 Mg Tabs (Irbesartan-hydrochlorothiazide) .Marland Kitchen... Take one tab by mouth qd    Benazepril Hcl 40 Mg Tabs (Benazepril hcl) .Marland Kitchen... Take 1 tablet by mouth once a day    Nitroquick 0.4 Mg Subl (Nitroglycerin) ..... Use as directed    Metoprolol Tartrate 25 Mg Tabs (Metoprolol tartrate) .Marland Kitchen... Take one half tablet by mouth two times a day    Clonidine Hcl 0.2 Mg Tabs (Clonidine hcl) ..... One tab by mouth bid  Orders: T-CBC w/Diff ([id number]) Cardiology Referral (Cardiology)   Problem # 4:  HYPERLIPIDEMIA (ICD-272.4) Assessment: Comment Only  Her updated medication list for this problem includes:    Lipitor 20 Mg Tabs (Atorvastatin calcium) .Marland Kitchen... Take 1 tab by mouth at bedtime  Orders: T-Hepatic Function [id number]) T-Lipid Profile  [id number])  Labs Reviewed: Chol: 132 (06/28/2008)   HDL: 57 (06/28/2008)   LDL: 62 (06/28/2008)   TG: 63 (06/28/2008) SGOT: 27 (06/28/2008)   SGPT: 19 (06/28/2008)   Complete Medication List: 1)  Skelaxin 800 Mg Tabs (Metaxalone) .... Take 1 tablet by mouth three times a day 2)  Hydrocodone-acetaminophen 7.5-750 Mg Tabs (Hydrocodone-acetaminophen) .... Take 1 tablet by mouth four times a day 3)  Lipitor 20 Mg Tabs (Atorvastatin calcium) .... Take 1 tab by mouth at bedtime 4)  Oscal 500/200 D-3 500-200 Mg-unit  Tabs (Calcium-vitamin d) .... Take 1 tablet by mouth three times a day 5)  Aspirin 81 Mg Tbec (Aspirin) .... Take 1 tablet by mouth once a day 6)  Avalide 300-25 Mg Tabs (Irbesartan-hydrochlorothiazide) .... Take one tab by mouth qd 7)  Benazepril Hcl 40 Mg Tabs (Benazepril hcl) .... Take 1 tablet by mouth once a day 8)  Vitamin C 500 Mg Tabs (Ascorbic acid) .... Take one tablets by mouth daily 9)  Nitroquick 0.4 Mg Subl (Nitroglycerin) .... Use as directed 10)  Centrum Silver Tabs (Multiple vitamins-minerals) .... Take 1 tablet by mouth once a day 11)  Metoprolol Tartrate 25 Mg Tabs (Metoprolol tartrate) .... Take one half tablet by mouth two times a day 12)  Clonidine Hcl 0.2 Mg Tabs (Clonidine hcl) .... One tab by mouth bid 13)  Omeprazole 20 Mg Tbec (Omeprazole) .... Take 1 tablet by mouth two times a day 14)  Oxycodone-acetaminophen 7.5-325 Mg Tabs (Oxycodone-acetaminophen) .... One tab by mouth qhs 15)  B-12 Dots 500 Mcg Subl (Cyanocobalamin) .... One tab under tongue once daily 16)  Carafate 1 Gm Tabs (Sucralfate) .... One tab by mouth ac and qhs  Other Orders: H1N1 vaccine G code ([id number])   Patient Instructions: 1)  Please schedule a follow-up appointment in 2 months. 2)  It is important that you exercise regularly at least 20 minutes 5 times a week. If you develop chest pain, have severe difficulty breathing, or feel very tired , stop exercising immediately and seek  medical attention. 3)  You need to lose weight. Consider a lower calorie diet and regular exercise.  4)  Check your Blood Pressure regularly. If it is above150/95  you should make an appointment. 5)   PLEASE take your meds EVERY day. 6)  BMP prior to visit, ICD-9: 7)  Hepatic Panel prior to visit, ICD-9:       in 2 months. 8)  Lipid Panel prior to visit, ICD-9: 9)  CBC w/ Diff prior to visit, ICD-9: and anemia panel 10)  H1N1 today   Prescriptions: HYDROCODONE-ACETAMINOPHEN 7.5-750 MG  TABS (HYDROCODONE-ACETAMINOPHEN) Take 1 tablet by mouth four times a day  #120 x 5   Entered by:   Worthy Keeler LPN   Authorized by:   Syliva Overman MD   Signed by:   Worthy Keeler LPN on 95/62/1308   Method used:   Printed then faxed to ...       [facility] ([facility]       [address]       [place], [place]  [address]       Ph: [phone]       Fax: [phone]   RxID:   [id number] AVALIDE 300-25 MG  TABS (IRBESARTAN-HYDROCHLOROTHIAZIDE) take one tab by mouth qd  #30 x 5   Entered by:   Worthy Keeler LPN   Authorized by:   Syliva Overman MD   Signed by:   Worthy Keeler LPN on 38/75/6433   Method used:   Electronically to        [facility] (retail)       [address]       [place], [place]  [address]       Ph: [phone]       Fax: [phone]   RxID:   [id number]     H1N1 # 1    Vaccine Type:  H1N1 vaccine G code    Site: right deltoid    Mfr: [employer]i Pasteur    Dose: 0.5 ml    Route: IM    Given by: Worthy Keeler LPN    Exp. Date: 12/31/2009    Lot #: [id number]    VIS given: 06/06/2009 given October 01, 2008.

## 2010-10-06 NOTE — Miscellaneous (Signed)
Summary: narc refill  Clinical Lists Changes  Medications: Rx of OXYCODONE-ACETAMINOPHEN 7.5-325 MG TABS (OXYCODONE-ACETAMINOPHEN) ONE TAB by mouth QHS;  #30 x 0;  Signed;  Entered by: Worthy Keeler LPN;  Authorized by: Syliva Overman MD;  Method used: Handwritten    Prescriptions: OXYCODONE-ACETAMINOPHEN 7.5-325 MG TABS (OXYCODONE-ACETAMINOPHEN) ONE TAB by mouth QHS  #30 x 0   Entered by:   Worthy Keeler LPN   Authorized by:   Syliva Overman MD   Signed by:   Worthy Keeler LPN on 54/05/8118   Method used:   Handwritten   RxID:   [id number]

## 2010-10-06 NOTE — Letter (Signed)
Summary: Generic Letter, Intro to Referring  [facility]  [address]   [place], [place] [address]   Phone: [phone]  Fax: [phone]      June 27, 2009             RE: [name]   [birth year]                 [address]                 [place], [place]  [address]  Dear Appt Secretary,    This pt has been scheduled an appt for 08/07/2009 @  10:00  w/  Dr. Jena Gauss.   Pt is aware of appt.         Sincerely,    Elinor Parkinson  [facility] Ph: [phone]   Fax: [phone]

## 2010-10-06 NOTE — Medication Information (Signed)
Summary: Tax adviser   Imported By: Lind Guest 07/30/2008 09:46:43  _____________________________________________________________________  External Attachment:    Type:   Image     Comment:   External Document

## 2010-10-06 NOTE — Assessment & Plan Note (Signed)
Summary: OV   Vital Signs:  Patient profile:   77 year old female Menstrual status:  postmenopausal Height:      59.9 inches Weight:      165 pounds BMI:     32.45 O2 Sat:      99 % Pulse rate:   68 / minute Pulse rhythm:   regular Resp:     16 per minute BP sitting:   122 / 82 Cuff size:   large  Vitals Entered By: Everitt Amber (July 07, 2009 11:37 AM)  Nutrition Counseling: Patient's BMI is greater than 25 and therefore counseled on weight management options. CC: Is having alot of nausea and bloating since she started the ulcer meds and shes not seeing gastro until dec. Very uncomfortable feeling Is Patient Diabetic? No   CC:  Is having alot of nausea and bloating since she started the ulcer meds and shes not seeing gastro until dec. Very uncomfortable feeling.  History of Present Illness: Pt comes in today extremely distressed, stating she has noted black stool, continues to have abdominal pain, and she is tearful stating she knows that nothing can happen to her since she has to care for her father. She has manad to almost entirely complete her entire course of therapy for Hpylori , and though she has had some cramping abd pain she has persevered with it.  Current Medications (verified): 1)  Skelaxin 800 Mg  Tabs (Metaxalone) .... Take 1 Tablet By Mouth Three Times A Day 2)  Hydrocodone-Acetaminophen 7.5-750 Mg  Tabs (Hydrocodone-Acetaminophen) .... Take 1 Tablet By Mouth Four Times A Day 3)  Lipitor 20 Mg  Tabs (Atorvastatin Calcium) .... Take 1 Tab By Mouth At Bedtime 4)  Oscal 500/200 D-3 500-200 Mg-Unit  Tabs (Calcium-Vitamin D) .... Take 1 Tablet By Mouth Three Times A Day 5)  Aspirin 81 Mg  Tbec (Aspirin) .... Take 1 Tablet By Mouth Once A Day 6)  Avalide 300-25 Mg  Tabs (Irbesartan-Hydrochlorothiazide) .... Take One Tab By Mouth Qd 7)  Benazepril Hcl 40 Mg  Tabs (Benazepril Hcl) .... Take 1 Tablet By Mouth Once A Day 8)  Vitamin C 500 Mg  Tabs (Ascorbic Acid) ....  Take One Tablets By Mouth Daily 9)  Nitroquick 0.4 Mg  Subl (Nitroglycerin) .... Use As Directed 10)  Centrum Silver   Tabs (Multiple Vitamins-Minerals) .... Take 1 Tablet By Mouth Once A Day 11)  Metoprolol Tartrate 25 Mg  Tabs (Metoprolol Tartrate) .... Take One Half Tablet By Mouth Two Times A Day 12)  Clonidine Hcl 0.2 Mg  Tabs (Clonidine Hcl) .... One Tab By Mouth Bid 13)  Oxycodone-Acetaminophen 7.5-325 Mg Tabs (Oxycodone-Acetaminophen) .... One Tab By Mouth Qhs 14)  B-12 Dots 500 Mcg Subl (Cyanocobalamin) .... One Tab Under Tongue Once Daily 15)  Carafate 1 Gm Tabs (Sucralfate) .... One Tab By Mouth Ac and Qhs 16)  Slow Release Iron 47.5 Mg Cr-Tabs (Ferrous Sulfate) .... One Tab By Mouth Qd 17)  Omeprazole 40 Mg Cpdr (Omeprazole) .... Take 1 Capsule By Mouth Once A Day 18)  Pink Bismuth 262 Mg Chew (Bismuth Subsalicylate) .... 2 Tablets Four Times Daily 19)  Metronidazole 250 Mg Tabs (Metronidazole) .... Take 1 Tablet By Mouth Four Times A Day 20)  Tetracycline Hcl 500 Mg Caps (Tetracycline Hcl) .... Take 1 Capsule By Mouth Four Times A Day  Allergies (verified): 1)  ! Pcn 2)  ! Codeine 3)  ! Wellbutrin  Review of Systems  See HPI General:  Complains of fatigue; denies chills and fever. ENT:  Denies hoarseness, nasal congestion, sinus pressure, and sore throat. CV:  Denies difficulty breathing at night and palpitations. Resp:  Denies cough and sputum productive. GI:  See HPI. GU:  Denies dysuria and urinary frequency. MS:  Complains of low back pain and mid back pain.  Physical Exam  General:  alert, well-hydrated, and overweight-appearing. anxious and tearful. HEENT: No facial asymmetry,  EOMI, No sinus tenderness, TM's Clear, oropharynx  pink and moist.   Chest: Clear to auscultation bilaterally.  CVS: S1, S2, No murmurs, No S3.   Abd: Soft,epigastric and upper abd tenderness, no guarding or rebound, rectalexam, green guaic negative stool MS: decreased ROM  spine,adequate in hips, shoulders and knees.  Ext: No edema.   CNS: CN 2-12 intact, power tone and sensation normal throughout.   Skin: Intact, no visible lesions or rashes.  Psych: Good eye contact,.  Memory intact,  anxious  appearing.     Complete Medication List: 1)  Skelaxin 800 Mg Tabs (Metaxalone) .... Take 1 tablet by mouth three times a day 2)  Hydrocodone-acetaminophen 7.5-750 Mg Tabs (Hydrocodone-acetaminophen) .... Take 1 tablet by mouth four times a day 3)  Lipitor 20 Mg Tabs (Atorvastatin calcium) .... Take 1 tab by mouth at bedtime 4)  Oscal 500/200 D-3 500-200 Mg-unit Tabs (Calcium-vitamin d) .... Take 1 tablet by mouth three times a day 5)  Aspirin 81 Mg Tbec (Aspirin) .... Take 1 tablet by mouth once a day 6)  Avalide 300-25 Mg Tabs (Irbesartan-hydrochlorothiazide) .... Take one tab by mouth qd 7)  Benazepril Hcl 40 Mg Tabs (Benazepril hcl) .... Take 1 tablet by mouth once a day 8)  Vitamin C 500 Mg Tabs (Ascorbic acid) .... Take one tablets by mouth daily 9)  Nitroquick 0.4 Mg Subl (Nitroglycerin) .... Use as directed 10)  Centrum Silver Tabs (Multiple vitamins-minerals) .... Take 1 tablet by mouth once a day 11)  Metoprolol Tartrate 25 Mg Tabs (Metoprolol tartrate) .... Take one half tablet by mouth two times a day 12)  Clonidine Hcl 0.2 Mg Tabs (Clonidine hcl) .... One tab by mouth bid 13)  Oxycodone-acetaminophen 7.5-325 Mg Tabs (Oxycodone-acetaminophen) .... One tab by mouth qhs 14)  B-12 Dots 500 Mcg Subl (Cyanocobalamin) .... One tab under tongue once daily 15)  Carafate 1 Gm Tabs (Sucralfate) .... One tab by mouth ac and qhs 16)  Slow Release Iron 47.5 Mg Cr-tabs (Ferrous sulfate) .... One tab by mouth qd 17)  Omeprazole 40 Mg Cpdr (Omeprazole) .... Take 1 capsule by mouth once a day 18)  Pink Bismuth 262 Mg Chew (Bismuth subsalicylate) .... 2 tablets four times daily 19)  Metronidazole 250 Mg Tabs (Metronidazole) .... Take 1 tablet by mouth four times a day 20)   Tetracycline Hcl 500 Mg Caps (Tetracycline hcl) .... Take 1 capsule by mouth four times a day  Other Orders: Hemoccult Guaiac-1 spec.(in office) ([id number])  Patient Instructions: 1)  f/u as before. 2)  your abdomen is soft, you do still have some tenderness in the upper abdomen, i will speak with Dr. Kendell Bane todAY AND CALLYOU BACK. 3)  PLS COMPLETE YOUR COURSE OF MEDS. 4)  PEPTOBISMOL TURNS THE STOOL BLACK Prescriptions: OXYCODONE-ACETAMINOPHEN 7.5-325 MG TABS (OXYCODONE-ACETAMINOPHEN) ONE TAB by mouth QHS  #30 x 0   Entered by:   Worthy Keeler LPN   Authorized by:   Syliva Overman MD   Signed by:   Worthy Keeler LPN on 98/07/9146  Method used:   Handwritten   RxID:   [id number]   Laboratory Results  Date/Time Received: 07/07/09 Date/Time Reported: 07/07/09  Stool - Occult Blood Hemmoccult #1: negative Date: 07/07/2009 Comments: [id number] 1L 3/12 118 10/12

## 2010-10-06 NOTE — Letter (Signed)
Summary: [employer]/2007/DR JENKINS  [employer]/2007/DR JENKINS   Imported By: Diana Eves 09/18/2009 11:10:05  _____________________________________________________________________  External Attachment:    Type:   Image     Comment:   External Document

## 2010-10-06 NOTE — Medication Information (Signed)
Summary: Tax adviser   Imported By: Lind Guest 06/25/2008 09:39:43  _____________________________________________________________________  External Attachment:    Type:   Image     Comment:   External Document

## 2010-10-06 NOTE — Letter (Signed)
Summary: Letter  Letter   Imported By: Lind Guest 06/24/2009 09:06:05  _____________________________________________________________________  External Attachment:    Type:   Image     Comment:   External Document

## 2010-10-06 NOTE — Progress Notes (Signed)
Summary: LAB ORDER AND RX  Phone Note Call from Patient   Summary of Call: HER AUNT HELEN IS COMING IN TODAY AT 10:15 AND DR. HAD CALLED HER TO DO NEW LABS SHE NEEDS THE SHEET AND ALSO HER RX PLEASE GIVE TO HER AUNT HELEN WHEN SHE COMES IN  Initial call taken by: Lind Guest,  September 15, 2009 8:50 AM  Follow-up for Phone Call        noted Follow-up by: Worthy Keeler LPN,  September 15, 2009 8:51 AM

## 2010-10-06 NOTE — Progress Notes (Signed)
  Phone Note Call from Patient   Caller: Patient Summary of Call: antibiotics are making her sick to her stomach, wants doctor to call her upon her return Initial call taken by: Worthy Keeler LPN,  July 03, 2009 1:38 PM  Follow-up for Phone Call        pt had OV this morning Follow-up by: Everitt Amber,  July 07, 2009 4:23 PM

## 2010-10-06 NOTE — Assessment & Plan Note (Signed)
Summary: office visit   Vital Signs:  Patient Profile:   77 Years Old Female Height:     63 inches Weight:      167.25 pounds BMI:     29.73 O2 Sat:      96 % Pulse rate:   64 / minute Resp:     16 per minute BP sitting:   128 / 80  (right arm)  Pt. in pain?   yes    Location:   Right leg  Vitals Entered By: Everitt Amber (July 03, 2008 1:14 PM)                  Chief Complaint:  Follow up, right leg pain, and can't put any weight on it.  History of Present Illness: Back pain continue sto be a problem pain raduiates down R leg cannot walk or put any weight on it, can't do surgery now. Pt will call for rept epidural. The pt denies any recent fever or chills. Sh reports that the anger aND DEPRession which she was experiencing over her mother's loss is finally easing.she continues to be very attentive in the care 63f her 58 y/o father with dementia. Ms. Angelica Ran is working on dietary change to facilitate weight loss.      Updated Prior Medication List: SKELAXIN 800 MG  TABS (METAXALONE) Take 1 tablet by mouth three times a day HYDROCODONE-ACETAMINOPHEN 7.5-750 MG  TABS (HYDROCODONE-ACETAMINOPHEN) Take 1 tablet by mouth four times a day LIPITOR 20 MG  TABS (ATORVASTATIN CALCIUM) Take 1 tab by mouth at bedtime OSCAL 500/200 D-3 500-200 MG-UNIT  TABS (CALCIUM-VITAMIN D) Take 1 tablet by mouth three times a day ASPIRIN 81 MG  TBEC (ASPIRIN) Take 1 tablet by mouth once a day AVALIDE 300-25 MG  TABS (IRBESARTAN-HYDROCHLOROTHIAZIDE) Take two tablets daily by mouth BENAZEPRIL HCL 40 MG  TABS (BENAZEPRIL HCL) Take 1 tablet by mouth once a day VITAMIN C 500 MG  TABS (ASCORBIC ACID) Take two tablets by mouth daily NITROQUICK 0.4 MG  SUBL (NITROGLYCERIN) Use as directed CENTRUM SILVER   TABS (MULTIPLE VITAMINS-MINERALS) Take 1 tablet by mouth once a day METOPROLOL TARTRATE 25 MG  TABS (METOPROLOL TARTRATE) Take 1 tablet by mouth two times a day CLONIDINE HCL 0.2 MG  TABS (CLONIDINE  HCL) one tab by mouth at bedtime OMEPRAZOLE 20 MG  TBEC (OMEPRAZOLE) Take 1 tablet by mouth two times a day OXYCODONE-ACETAMINOPHEN 7.5-325 MG TABS (OXYCODONE-ACETAMINOPHEN) ONE TAB by mouth QHS B-12 DOTS 500 MCG SUBL (CYANOCOBALAMIN) One tab under tongue once daily VITAMIN C CR 500 MG CR-TABS (ASCORBIC ACID) Take one tab by mouth once daily  Current Allergies: ! PCN ! CODEINE ! WELLBUTRIN     Review of Systems  ENT      Denies hoarseness, nasal congestion, and sinus pressure.  CV      Denies difficulty breathing at night, palpitations, shortness of breath with exertion, and swelling of feet.  Resp      Denies cough, shortness of breath, sputum productive, and wheezing.  GI      Complains of abdominal pain and indigestion.      Denies constipation, diarrhea, and vomiting.      will check H pylori  GU      Denies dysuria and urinary frequency.  MS      Complains of joint pain, low back pain, mid back pain, and stiffness.  Psych      Complains of anxiety.      Denies depression.  Endo      Denies excessive thirst and polyuria.  Allergy      Denies hives or rash, itching eyes, and sneezing.   Physical Exam  General:     Well-developed,well-nourished,in no acute distress; alert,appropriate and cooperative throughout examination Head:     Normocephalic and atraumatic without obvious abnormalities. No apparent alopecia or balding. Eyes:     vision grossly intact.   Ears:     External ear exam shows no significant lesions or deformities.  Otoscopic examination reveals clear canals, tympanic membranes are intact bilaterally without bulging, retraction, inflammation or discharge. Hearing is grossly normal bilaterally. Nose:     External nasal examination shows no deformity or inflammation. Nasal mucosa are pink and moist without lesions or exudates. Mouth:     Oral mucosa and oropharynx without lesions or exudates.  Teeth in good repair. Neck:     No deformities,  masses, or tenderness noted. Lungs:     Normal respiratory effort, chest expands symmetrically. Lungs are clear to auscultation, no crackles or wheezes. Heart:     Normal rate and regular rhythm. S1 and S2 normal without gallop, murmur, click, rub or other extra sounds. Abdomen:     soft , mild epigastric tenderness Msk:     decreased rOM thoracolumbar spine Extremities:     no edema Neurologic:     alert & oriented X3, cranial nerves II-XII intact, and strength normal in all extremities.   Skin:     Intact without suspicious lesions or rashes Cervical Nodes:     No lymphadenopathy noted Psych:     Cognition and judgment appear intact. Alert and cooperative with normal attention span and concentration. No apparent delusions, illusions, hallucinations    Impression & Recommendations:  Problem # 1:  BACK PAIN WITH RADICULOPATHY (ICD-729.2) Assessment: Deteriorated  Problem # 2:  HYPERTENSION (ICD-401.9) Assessment: Comment Only  Her updated medication list for this problem includes:    Avalide 300-25 Mg Tabs (Irbesartan-hydrochlorothiazide) .Marland Kitchen... Take two tablets daily by mouth    Benazepril Hcl 40 Mg Tabs (Benazepril hcl) .Marland Kitchen... Take 1 tablet by mouth once a day    Metoprolol Tartrate 25 Mg Tabs (Metoprolol tartrate) .Marland Kitchen... Take 1 tablet by mouth two times a day    Clonidine Hcl 0.2 Mg Tabs (Clonidine hcl) ..... One tab by mouth at bedtime  BP today:140/80, uncontrolled, if systolic elevated at next visit, clonidine dose will be increased  Labs Reviewed: Creat: 1.10 (06/28/2008) Chol: 132 (06/28/2008)   HDL: 57 (06/28/2008)   LDL: 62 (06/28/2008)   TG: 63 (06/28/2008)   Problem # 3:  HYPERLIPIDEMIA (ICD-272.4) Assessment: Unchanged  Her updated medication list for this problem includes:    Lipitor 20 Mg Tabs (Atorvastatin calcium) .Marland Kitchen... Take 1 tab by mouth at bedtime  Labs Reviewed: Chol: 132 (06/28/2008)   HDL: 57 (06/28/2008)   LDL: 62 (06/28/2008)   TG: 63  (06/28/2008) SGOT: 27 (06/28/2008)   SGPT: 19 (06/28/2008)   Problem # 4:  UNSPECIFIED ANEMIA (ICD-285.9) Assessment: Comment Only  Her updated medication list for this problem includes:    B-12 Dots 500 Mcg Subl (Cyanocobalamin) ..... One tab under tongue once daily Hgb: 10.7 (06/28/2008)   Hct: 35.6 (06/28/2008)   RDW: 14.1 (06/28/2008)   MCV: 93.0 (06/28/2008)   MCHC: 30.1 (06/28/2008) Retic Ct: 26.4 K/uL (07/01/2008)   Ferritin: 170 (07/01/2008) Iron: 84 (07/01/2008)   TIBC: 302 (07/01/2008)   % Sat: 28 (07/01/2008) B12: >2000 pg/mL (07/01/2008)   Folate: >20.0 ng/mL (  07/01/2008) , CCUA is negative for blood and pt's most recent colonscopy is negative, will start carafate and check hpylor, she is on omeprazole and does have dyspepsia,  refer for upper endo     Problem # 5:  DYSPEPSIA (ICD-536.8) Assessment: Deteriorated  Orders: T- * Misc. Laboratory test [phone])   Complete Medication List: 1)  Skelaxin 800 Mg Tabs (Metaxalone) .... Take 1 tablet by mouth three times a day 2)  Hydrocodone-acetaminophen 7.5-750 Mg Tabs (Hydrocodone-acetaminophen) .... Take 1 tablet by mouth four times a day 3)  Lipitor 20 Mg Tabs (Atorvastatin calcium) .... Take 1 tab by mouth at bedtime 4)  Oscal 500/200 D-3 500-200 Mg-unit Tabs (Calcium-vitamin d) .... Take 1 tablet by mouth three times a day 5)  Aspirin 81 Mg Tbec (Aspirin) .... Take 1 tablet by mouth once a day 6)  Avalide 300-25 Mg Tabs (Irbesartan-hydrochlorothiazide) .... Take two tablets daily by mouth 7)  Benazepril Hcl 40 Mg Tabs (Benazepril hcl) .... Take 1 tablet by mouth once a day 8)  Vitamin C 500 Mg Tabs (Ascorbic acid) .... Take two tablets by mouth daily 9)  Nitroquick 0.4 Mg Subl (Nitroglycerin) .... Use as directed 10)  Centrum Silver Tabs (Multiple vitamins-minerals) .... Take 1 tablet by mouth once a day 11)  Metoprolol Tartrate 25 Mg Tabs (Metoprolol tartrate) .... Take 1 tablet by mouth two times a day 12)  Clonidine Hcl  0.2 Mg Tabs (Clonidine hcl) .... One tab by mouth at bedtime 13)  Omeprazole 20 Mg Tbec (Omeprazole) .... Take 1 tablet by mouth two times a day 14)  Oxycodone-acetaminophen 7.5-325 Mg Tabs (Oxycodone-acetaminophen) .... One tab by mouth qhs 15)  B-12 Dots 500 Mcg Subl (Cyanocobalamin) .... One tab under tongue once daily 16)  Vitamin C Cr 500 Mg Cr-tabs (Ascorbic acid) .... Take one tab by mouth once daily 17)  Carafate 1 Gm Tabs (Sucralfate) .... One tab by mouth ac and qhs 18)  Prevpac Misc (Amoxicill-clarithro-lansopraz) .... Uad 19)  Flagyl 250 Mg Tabs (Metronidazole) .... Take one tab by mouth qid for 2 weeks 20)  Tetracycline Hcl 500 Mg Caps (Tetracycline hcl) .... Take one tab by mouth four times a day for 2 weeks 21)  Ranitidine Hcl 150 Mg Tabs (Ranitidine hcl) .... One tab by mouth two times a day for 2 weeks 22)  Peptic Relief 262 Mg Tabs (Bismuth subsalicylate) .... Take 2 tabs by mouth four times daily for 2 weeks  Other Orders: T-CBC w/Diff ([id number]) T- * Misc. Laboratory test [id number]) Glucose, (CBG) [phone]) Hemoglobin A1C (83036) UA Dipstick W/ Micro (manual) (47425) T-Culture, Urine ([id number]) T-Urinalysis (32951-88416)   Patient Instructions: 1)  F/U in mid January. 2)  CBC and anemia panel in January before your return. 3)  Your labs are great no med changes. 4)  Congrats on your weight loss.   Prescriptions: CARAFATE 1 GM TABS (SUCRALFATE) one tab by mouth ac and qhs  #120 x 5   Entered by:   Worthy Keeler LPN   Authorized by:   Syliva Overman MD   Signed by:   Worthy Keeler LPN on 60/63/0160   Method used:   Electronically to        [facility] (retail)       [address]       [place], [place]  [address]       Ph: [phone]       Fax: [phone]  RxID:   [id number] CLONIDINE HCL 0.2 MG  TABS (CLONIDINE HCL) one tab by mouth at bedtime  #30 x 5   Entered by:   Worthy Keeler LPN   Authorized by:   Syliva Overman MD   Signed by:   Worthy Keeler LPN on 28/31/5176   Method used:   Electronically to        [facility] [address] (retail)       [address]       [place], [place]  [address]       Ph: [phone]       Fax: [phone]   RxID:   [id number] LIPITOR 20 MG  TABS (ATORVASTATIN CALCIUM) Take 1 tab by mouth at bedtime  #30 Tablet x 5   Entered by:   Worthy Keeler LPN   Authorized by:   Syliva Overman MD   Signed by:   Worthy Keeler LPN on 38/06/1750   Method used:   Electronically to        [facility] [address] (retail)       [address]       [place], [place]  [address]       Ph: [phone]       Fax: [phone]   RxID:   [id number]  ]   Laboratory Results   Urine Tests  Date/Time Received: 07/03/08 2:30p Date/Time Reported: 07/03/08 2:30p  Routine Urinalysis   Color: yellow Appearance: Clear Glucose: negative   (Normal Range: Negative) Bilirubin: negative   (Normal Range: Negative) Ketone: negative   (Normal Range: Negative) Spec. Gravity: 1.015   (Normal Range: 1.003-1.035) Blood: trace-intact   (Normal Range: Negative) pH: 5.0   (Normal Range: 5.0-8.0) Protein: negative   (Normal Range: Negative) Urobilinogen: 0.2   (Normal Range: 0-1) Nitrite: negative   (Normal Range: Negative) Leukocyte Esterace: negative   (Normal Range: Negative)     Blood Tests   Date/Time Received: July 03, 2008 2:30pm Date/Time Reported: July 03, 2008 2:30pm  Glucose (random): 106 mg/dL   (Normal Range: 58-099) HGBA1C: 6.2%   (Normal Range: Non-Diabetic - 3-6%   Control Diabetic - 6-8%)      Laboratory Results   Urine Tests    Routine Urinalysis   Color: yellow Appearance: Clear Glucose: negative   (Normal Range: Negative) Bilirubin: negative   (Normal Range: Negative) Ketone: negative   (Normal Range: Negative) Spec. Gravity: 1.015   (Normal Range: 1.003-1.035) Blood: trace-intact   (Normal  Range: Negative) pH: 5.0   (Normal Range: 5.0-8.0) Protein: negative   (Normal Range: Negative) Urobilinogen: 0.2   (Normal Range: 0-1) Nitrite: negative   (Normal Range: Negative) Leukocyte Esterace: negative   (Normal Range: Negative)     Blood Tests     Glucose (random): 106 mg/dL   (Normal Range: 83-382) HGBA1C: 6.2%   (Normal Range: Non-Diabetic - 3-6%   Control Diabetic - 6-8%)     Appended Document: office visit pls remove charges for ccua, blood glucose and hBa1c these ese tests, if done on this pt were all done in error  Appended Document: office visit this will obe removed from the bill

## 2010-10-06 NOTE — Miscellaneous (Signed)
Summary: [facility] REFILL  Clinical Lists Changes  Medications: Changed medication from OXYCODONE-ACETAMINOPHEN 7.5-325 MG TABS (OXYCODONE-ACETAMINOPHEN) ONE TAB by mouth QHS to OXYCODONE-ACETAMINOPHEN 7.5-325 MG TABS (OXYCODONE-ACETAMINOPHEN) ONE TAB by mouth QHS - Signed Rx of OXYCODONE-ACETAMINOPHEN 7.5-325 MG TABS (OXYCODONE-ACETAMINOPHEN) ONE TAB by mouth QHS;  #30 x 0;  Signed;  Entered by: Everitt Amber;  Authorized by: Syliva Overman MD;  Method used: Handwritten    Prescriptions: OXYCODONE-ACETAMINOPHEN 7.5-325 MG TABS (OXYCODONE-ACETAMINOPHEN) ONE TAB by mouth QHS  #30 x 0   Entered by:   Everitt Amber   Authorized by:   Syliva Overman MD   Signed by:   Everitt Amber on 05/01/2009   Method used:   Handwritten   RxID:   [id number]

## 2010-10-06 NOTE — Progress Notes (Signed)
Summary: RX  Phone Note Call from Patient   Summary of Call: NEEDS RX FOR HER Harvie Junior WHEN READY AT [phone] Initial call taken by: Lind Guest,  April 30, 2009 11:31 AM  Follow-up for Phone Call        RX available, patient aware Follow-up by: Everitt Amber,  April 30, 2009 4:35 PM     Appended Document: RX wrong # entered [id number]

## 2010-10-06 NOTE — Miscellaneous (Signed)
  Clinical Lists Changes  Medications: Added new medication of PINK BISMUTH 262 MG CHEW (BISMUTH SUBSALICYLATE) 2 tablets four times daily - Signed Added new medication of METRONIDAZOLE 250 MG TABS (METRONIDAZOLE) Take 1 tablet by mouth four times a day - Signed Added new medication of TETRACYCLINE HCL 500 MG CAPS (TETRACYCLINE HCL) Take 1 capsule by mouth four times a day - Signed Rx of PINK BISMUTH 262 MG CHEW (BISMUTH SUBSALICYLATE) 2 tablets four times daily;  #112 x 0;  Signed;  Entered by: Syliva Overman MD;  Authorized by: Syliva Overman MD;  Method used: Electronically to [facility] [address] [place], [place], [place]  [address], Ph: [phone], Fax: [phone] Rx of METRONIDAZOLE 250 MG TABS (METRONIDAZOLE) Take 1 tablet by mouth four times a day;  #56 x 0;  Signed;  Entered by: Syliva Overman MD;  Authorized by: Syliva Overman MD;  Method used: Electronically to [facility] [address] [place], [place], [place]  [address], Ph: [phone], Fax: [phone] Rx of TETRACYCLINE HCL 500 MG CAPS (TETRACYCLINE HCL) Take 1 capsule by mouth four times a day;  #56 x 0;  Signed;  Entered by: Syliva Overman MD;  Authorized by: Syliva Overman MD;  Method used: Electronically to [facility] [address], [place], [place], [place]  [address], Ph: [phone], Fax: [phone]    Prescriptions: TETRACYCLINE HCL 500 MG CAPS (TETRACYCLINE HCL) Take 1 capsule by mouth four times a day  #56 x 0   Entered and Authorized by:   Syliva Overman MD   Signed by:   Syliva Overman MD on 06/27/2009   Method used:   Electronically to        [facility] [address] (retail)       [address]       [place], [place]  [address]       Ph: [phone]       Fax: [phone]   RxID:   [id number] METRONIDAZOLE 250 MG TABS (METRONIDAZOLE) Take 1 tablet by mouth four times a day  #56 x 0   Entered and  Authorized by:   Syliva Overman MD   Signed by:   Syliva Overman MD on 06/27/2009   Method used:   Electronically to        [facility] (retail)       [address]       [place], [place]  [address]       Ph: [phone]       Fax: [phone]   RxID:   [id number] PINK BISMUTH 262 MG CHEW (BISMUTH SUBSALICYLATE) 2 tablets four times daily  #112 x 0   Entered and Authorized by:   Syliva Overman MD   Signed by:   Syliva Overman MD on 06/27/2009   Method used:   Electronically to        [facility] [address] (retail)       [address]       [place], [place]  [address]       Ph: [phone]       Fax: [phone]   RxID:   [id number]   Appended Document:  Patient aware

## 2010-10-06 NOTE — Progress Notes (Signed)
Summary: stomach pains  Phone Note Call from Patient   Summary of Call: having alot of stomach pains and would like to talk with her.  [phone] Initial call taken by: Rudene Anda,  June 25, 2009 4:47 PM  Follow-up for Phone Call        pt reports that her stomach hurts all the time that she eats with norelief, it's  getting worse.  pls send lab order forzh pylori to lab, nursing has already done this.   Follow-up by: Syliva Overman MD,  June 25, 2009 5:03 PM  Additional Follow-up for Phone Call Additional follow up Details #1::        pls referto gi evaluate abdominal pain, anemia and bloating asap symptomsare wotrsening Additional Follow-up by: Syliva Overman MD,  June 25, 2009 5:20 PM    Additional Follow-up for Phone Call Additional follow up Details #2::    sent over information to dr. Jeanella Flattery office. They will call pt with appt. pt notified Follow-up by: Rudene Anda,  June 26, 2009 9:54 AM

## 2010-10-06 NOTE — Miscellaneous (Signed)
Summary: [facility] REFILL  Clinical Lists Changes  Medications: Changed medication from OXYCODONE-ACETAMINOPHEN 7.5-325 MG TABS (OXYCODONE-ACETAMINOPHEN) ONE TAB by mouth QHS to OXYCODONE-ACETAMINOPHEN 7.5-325 MG TABS (OXYCODONE-ACETAMINOPHEN) ONE TAB by mouth QHS - Signed Rx of OXYCODONE-ACETAMINOPHEN 7.5-325 MG TABS (OXYCODONE-ACETAMINOPHEN) ONE TAB by mouth QHS;  #30 x 0;  Signed;  Entered by: Everitt Amber;  Authorized by: Syliva Overman MD;  Method used: Handwritten    Prescriptions: OXYCODONE-ACETAMINOPHEN 7.5-325 MG TABS (OXYCODONE-ACETAMINOPHEN) ONE TAB by mouth QHS  #30 x 0   Entered by:   Everitt Amber   Authorized by:   Syliva Overman MD   Signed by:   Everitt Amber on 10/31/2008   Method used:   Handwritten   RxID:   [id number]

## 2010-10-06 NOTE — Miscellaneous (Signed)
Summary: [facility] REFILL  Clinical Lists Changes  Medications: Rx of OXYCODONE-ACETAMINOPHEN 7.5-325 MG TABS (OXYCODONE-ACETAMINOPHEN) ONE TAB by mouth QHS;  #30 x 0;  Signed;  Entered by: Everitt Amber;  Authorized by: Syliva Overman MD;  Method used: Handwritten    Prescriptions: OXYCODONE-ACETAMINOPHEN 7.5-325 MG TABS (OXYCODONE-ACETAMINOPHEN) ONE TAB by mouth QHS  #30 x 0   Entered by:   Everitt Amber   Authorized by:   Syliva Overman MD   Signed by:   Everitt Amber on 10/16/2009   Method used:   Handwritten   RxID:   [id number]

## 2010-10-06 NOTE — Letter (Signed)
Summary: External Other  External Other   Imported By: Peggyann Shoals 07/14/2009 11:23:10  _____________________________________________________________________  External Attachment:    Type:   Image     Comment:   External Document

## 2010-10-06 NOTE — Progress Notes (Signed)
Summary: LAB ORDER  Phone Note Call from Patient   Summary of Call: WANTS TO KNOW DOES SHE NEED LAB ORDERS BEFORE SHE MAKES AN APPOINMENT  CALL BACK AT [phone] TO LET HER KNOW Initial call taken by: Lind Guest,  May 29, 2009 4:14 PM  Follow-up for Phone Call        had labs done last in april. Lipid, BMP,CBC. What does she need to have done before she comes back? Follow-up by: Everitt Amber,  May 30, 2009 2:38 PM  Additional Follow-up for Phone Call Additional follow up Details #1::        cbc and anemia panel , tsh , fasting lipid and hepatic and chem 7 , pls let her know and order the labs Additional Follow-up by: Syliva Overman MD,  June 01, 2009 7:29 PM  New Problems: FATIGUE (ICD-780.79)   Additional Follow-up for Phone Call Additional follow up Details #2::    Labs ordered and faxed to lab, patient aware Follow-up by: Everitt Amber,  June 02, 2009 8:45 AM  New Problems: FATIGUE (ICD-780.79)

## 2010-10-06 NOTE — Miscellaneous (Signed)
Summary: Orders Update  Clinical Lists Changes  Orders: Added new Test order of TLB-H. Pylori Abs(Helicobacter Pylori) ([id number]) - Signed 

## 2010-10-06 NOTE — Miscellaneous (Signed)
  Clinical Lists Changes     need to add ACE and ARB allergy

## 2010-10-06 NOTE — Assessment & Plan Note (Signed)
Summary: abd pain,anemia,bloating/ss   Visit Type:  Initial Visit Primary Care Provider:  simpson  Chief Complaint:  abd pain, bloating, and anemia.  History of Present Illness: Very pleasant 77 year old lady referred out of the courtesy of Dr. Lodema Hong to further evaluate cresendo symptoms of upper abdominal discomfort.  She reports upper abdominal epigastric discomfort with associated bloating actually going back a number of years. She got her gallbladder for what sounds like biliary dyskinesia a couple of years ago( Dr. Lovell Sheehan). This gave her transient improvement in symptoms. She does not have much in the way of typical reflux symptoms. She denies odynophagia or dysphagia. She does, however, had prominent symptoms of early satiety although she has not lost much weight recently. She has not had any melena or hematochezia and sees although her stools have been dark recently with Pepto-Bismol therapy] strictures Hemoccult negative recently Dr. Anthony Sar office. She reports undergoing a colonoscopy about 2 years ago locally without any significant findings. She has not had any imaging of her upper GI tract or abdomen recently. She was noted to have positive H. pylori serologies (with a relatively high titer at 8.0] for which he is taking a non-penicillin based regimen. She has 3 more days to go on therapy. She is not at this point appreciaing any improvement in her symptoms. She has been on acid suppression therapy intermittently since at least the early part of this year without any significant improvement. There is no family history of any GI malignancy. She has a 10 year history of smoking approximately 1/4 pack of cigarettes daily and one to 2 glasses of wine per day over several years but none in the past 20 years. She has not had any fever or chills clay-colored stools dark colored urine or jaundice.  Laboratory evaluation through Dr. Anthony Sar office from June 07, 2001 and demonstrated completely  normal hepatic profile white count 4.5 hemoglobin and hematocrit 10.0 and 32.4 respectively MCV 91.3 count 233,000 serum iron 60 TIBC 272% saturation 22% vitamin B12 greater than 2000 folate greater than 20.0 ferritin 198  Preventive Screening-Counseling & Management  Alcohol-Tobacco     Smoking Status: never  Current Problems (verified): 1)  Unspecified Anemia  (ICD-285.9) 2)  Fatigue  (ICD-780.79) 3)  Cad  (ICD-414.00) 4)  Dyspepsia  (ICD-536.8) 5)  Unspecified Anemia  (ICD-285.9) 6)  Back Pain With Radiculopathy  (ICD-729.2) 7)  Hypertension  (ICD-401.9) 8)  Hyperlipidemia  (ICD-272.4)  Current Medications (verified): 1)  Skelaxin 800 Mg  Tabs (Metaxalone) .... Take 1 Tablet By Mouth Three Times A Day 2)  Hydrocodone-Acetaminophen 7.5-750 Mg  Tabs (Hydrocodone-Acetaminophen) .... Take 1 Tablet By Mouth Four Times A Day 3)  Lipitor 20 Mg  Tabs (Atorvastatin Calcium) .... Take 1 Tab By Mouth At Bedtime 4)  Oscal 500/200 D-3 500-200 Mg-Unit  Tabs (Calcium-Vitamin D) .... Take 1 Tablet By Mouth Three Times A Day 5)  Aspirin 81 Mg  Tbec (Aspirin) .... Take 1 Tablet By Mouth Once A Day 6)  Avalide 300-25 Mg  Tabs (Irbesartan-Hydrochlorothiazide) .... Take One Tab By Mouth Qd 7)  Benazepril Hcl 40 Mg  Tabs (Benazepril Hcl) .... Take 1 Tablet By Mouth Once A Day 8)  Vitamin C 500 Mg  Tabs (Ascorbic Acid) .... Take One Tablets By Mouth Daily 9)  Nitroquick 0.4 Mg  Subl (Nitroglycerin) .... Use As Directed 10)  Centrum Silver   Tabs (Multiple Vitamins-Minerals) .... Take 1 Tablet By Mouth Once A Day 11)  Metoprolol Tartrate 25 Mg  Tabs (Metoprolol Tartrate) .... Take One Half Tablet By Mouth Two Times A Day 12)  Clonidine Hcl 0.2 Mg  Tabs (Clonidine Hcl) .... One Tab By Mouth Bid 13)  Oxycodone-Acetaminophen 7.5-325 Mg Tabs (Oxycodone-Acetaminophen) .... One Tab By Mouth Qhs 14)  B-12 Dots 500 Mcg Subl (Cyanocobalamin) .... One Tab Under Tongue Once Daily 15)  Carafate 1 Gm Tabs (Sucralfate)  .... One Tab By Mouth Ac and Qhs 16)  Slow Release Iron 47.5 Mg Cr-Tabs (Ferrous Sulfate) .... One Tab By Mouth Qd 17)  Omeprazole 40 Mg Cpdr (Omeprazole) .... Take 1 Capsule By Mouth Once A Day 18)  Pink Bismuth 262 Mg Chew (Bismuth Subsalicylate) .... 2 Tablets Four Times Daily 19)  Metronidazole 250 Mg Tabs (Metronidazole) .... Take 1 Tablet By Mouth Four Times A Day 20)  Tetracycline Hcl 500 Mg Caps (Tetracycline Hcl) .... Take 1 Capsule By Mouth Four Times A Day  Allergies (verified): 1)  ! Pcn 2)  ! Codeine 3)  ! Wellbutrin  Past History:  Past Medical History: Last updated: 08/19/2007 Diabetes mellitus, type II Hyperlipidemia Obesity Severe DJD Left hand pain with numbness, probable carpal tunnel syndrome, new onset Hypertension CAD chronic back pain  Past Surgical History: Last updated: 08/19/2007 Appendectomy-1987 Carpal tunnel release right Hysterectomy partial -1972 Arthroscopy left shoulder-1999 Arthroscopy left knee-1992 Back surgery lumbar-1989 1999  Family History: Last updated: 2009-07-23 Father: alive Mother:deceased- anuerysm, cva  Siblings: no siblings No FH of Colon Cancer:  Social History: Last updated: 2009/07/23 Marital Status: no Children: 4  Occupation: no Patient has never smoked.  Alcohol Use - no  Risk Factors: Smoking Status: never (2009/07/23)  Family History: Father: alive Mother:deceased- anuerysm, cva  Siblings: no siblings No FH of Colon Cancer:  Social History: Marital Status: no Children: 4  Occupation: no Patient has never smoked.  Alcohol Use - no Smoking Status:  never  Vital Signs:  Patient profile:   77 year old female Menstrual status:  postmenopausal Height:      59.5 inches Weight:      163 pounds BMI:     32.49 Temp:     97.4 degrees F oral Pulse rate:   64 / minute BP sitting:   110 / 80  (left arm) Cuff size:   regular  Vitals Entered By: Hendricks Limes LPN 23-Jul-2009 9:45  AM)  Physical Exam  General:  very, very pleasant well-groomed lady resting comfortably in no acute distress. Eyes:  no scleral icterus conjunctivae are pink Lungs:  clear to auscultation Heart:  regular and rhythm without murmur gallop or lobe Abdomen:  nondistended positive bowel sounds she does have minimal epigastric tenderness to palpation I did not appreciate any mass organomegaly. Additional Exam:  I do not appreciate any cervical adenopathy  Impression & Recommendations: Impression: 77 year old lady with chronic symptoms consistent with dyspepsia which is notably worsened this year particularly over the past couple of months. She has compelling evidence for Helicobacter pylori infection and she is currently undergoing treatment for this entity.  She is anemic. She has significant symptoms of early satiety. Symptoms are progressive.  The scenario warrants further evaluation.  Recommendations: Diagnostic EGD  as soon as can be arranged. Risks, benefits, limitations and alternatives have been reviewed with this nice lady.  Her questions were answered. She is agreeable.  I told her if the EGD was unrevealing as to the cause of her symptoms, then  then further diagnostic studies would be pursued as appropriate.  I'd like  to thank  Dr. Syliva Overman for allowing [place] to see this nice lady today.  Appended Document: Orders Update-charge    Clinical Lists Changes  Orders: Added new Service order of New Patient Level IV ([address]109) - Signed

## 2010-10-06 NOTE — Medication Information (Signed)
Summary: Tax adviser   Imported By: Lind Guest 11/04/2008 14:56:59  _____________________________________________________________________  External Attachment:    Type:   Image     Comment:   External Document

## 2010-10-06 NOTE — Miscellaneous (Signed)
Summary: [facility] REFILL  Clinical Lists Changes  Medications: Rx of OXYCODONE-ACETAMINOPHEN 7.5-325 MG TABS (OXYCODONE-ACETAMINOPHEN) ONE TAB by mouth QHS;  #30 x 0;  Signed;  Entered by: Worthy Keeler LPN;  Authorized by: Syliva Overman MD;  Method used: Handwritten    Prescriptions: OXYCODONE-ACETAMINOPHEN 7.5-325 MG TABS (OXYCODONE-ACETAMINOPHEN) ONE TAB by mouth QHS  #30 x 0   Entered by:   Worthy Keeler LPN   Authorized by:   Syliva Overman MD   Signed by:   Worthy Keeler LPN on 62/13/0865   Method used:   Handwritten   RxID:   [id number]

## 2010-10-06 NOTE — Progress Notes (Signed)
Summary: refill  Phone Note Call from Patient   Summary of Call: pt needs a refill on avalide [phone] Initial call taken by: Rudene Anda,  May 22, 2010 10:34 AM  Follow-up for Phone Call        Rx Called In Follow-up by: Adella Hare LPN,  May 22, 2010 10:48 AM    Prescriptions: AVALIDE 300-25 MG TABS (IRBESARTAN-HYDROCHLOROTHIAZIDE) Take 1 tablet by mouth once a day  #60 Tablet x 0   Entered by:   Adella Hare LPN   Authorized by:   Syliva Overman MD   Signed by:   Adella Hare LPN on 11/91/4782   Method used:   Electronically to        [facility] (retail)       [address]       [place], [place]  [address]       Ph: [phone]       Fax: [phone]   RxID:   [id number]

## 2010-10-06 NOTE — Medication Information (Signed)
Summary: Tax adviser   Imported By: Lind Guest 05/28/2008 15:06:23  _____________________________________________________________________  External Attachment:    Type:   Image     Comment:   External Document

## 2010-10-06 NOTE — Progress Notes (Signed)
Summary: CALL  Phone Note Call from Patient   Summary of Call: WANTS YOU TO CALL HER WHEN YOU HAVE SOME TIME Initial call taken by: Lind Guest,  December 30, 2008 1:58 PM  Follow-up for Phone Call        Pt states she will call back to reschedule when she gets ready to, she has accepted an apology and will resched when ready. I reviewde her recent labs with her , The cardiologist saw her 04/14 and stated that all was well. I will have further discussion on this with improved communication between front and back staff.   Follow-up by: Syliva Overman MD,  December 30, 2008 7:22 PM

## 2010-10-06 NOTE — Miscellaneous (Signed)
Summary: Home Care Report  Home Care Report   Imported By: Lind Guest 07/03/2010 08:36:51  _____________________________________________________________________  External Attachment:    Type:   Image     Comment:   External Document

## 2010-10-06 NOTE — Letter (Signed)
Summary: flma papers  flma papers   Imported By: Lind Guest 06/29/2010 14:08:58  _____________________________________________________________________  External Attachment:    Type:   Image     Comment:   External Document

## 2010-10-06 NOTE — Progress Notes (Signed)
Summary: rx  Phone Note Call from Patient   Summary of Call: wants to pick up her rx  today Initial call taken by: Lind Guest,  October 31, 2008 1:04 PM  Follow-up for Phone Call        RX ready for pickup Follow-up by: Everitt Amber,  October 31, 2008 1:51 PM

## 2010-10-06 NOTE — Miscellaneous (Signed)
Summary: [facility] REFILL  Clinical Lists Changes  Medications: Added new medication of OXYCODONE-ACETAMINOPHEN 7.5-325 MG TABS (OXYCODONE-ACETAMINOPHEN) ONE TAB by mouth QHS - Signed Rx of OXYCODONE-ACETAMINOPHEN 7.5-325 MG TABS (OXYCODONE-ACETAMINOPHEN) ONE TAB by mouth QHS;  #30 x 0;  Signed;  Entered by: Worthy Keeler LPN;  Authorized by: Syliva Overman MD;  Method used: Handwritten    Prescriptions: OXYCODONE-ACETAMINOPHEN 7.5-325 MG TABS (OXYCODONE-ACETAMINOPHEN) ONE TAB by mouth QHS  #30 x 0   Entered by:   Worthy Keeler LPN   Authorized by:   Syliva Overman MD   Signed by:   Worthy Keeler LPN on 46/96/2952   Method used:   Handwritten   RxID:   [id number]

## 2010-10-06 NOTE — Miscellaneous (Signed)
Summary: [facility] REFILL  Clinical Lists Changes  Medications: Rx of OXYCODONE-ACETAMINOPHEN 7.5-325 MG TABS (OXYCODONE-ACETAMINOPHEN) ONE TAB by mouth QHS;  #30 x 0;  Signed;  Entered by: Everitt Amber LPN;  Authorized by: Syliva Overman MD;  Method used: Handwritten    Prescriptions: OXYCODONE-ACETAMINOPHEN 7.5-325 MG TABS (OXYCODONE-ACETAMINOPHEN) ONE TAB by mouth QHS  #30 x 0   Entered by:   Everitt Amber LPN   Authorized by:   Syliva Overman MD   Signed by:   Everitt Amber LPN on 10/62/6948   Method used:   Handwritten   RxID:   [id number]

## 2010-10-06 NOTE — Medication Information (Signed)
Summary: Tax adviser   Imported By: Lind Guest 05/02/2009 13:47:06  _____________________________________________________________________  External Attachment:    Type:   Image     Comment:   External Document

## 2010-10-06 NOTE — Progress Notes (Signed)
Summary: RX  Phone Note Call from Patient   Summary of Call: NEEDS RX FOR HER [facility] RX WILL PICK UP TODAY Initial call taken by: Lind Guest,  June 24, 2008 11:37 AM  Follow-up for Phone Call        prescription was picked up in office today Follow-up by: Worthy Keeler LPN,  June 24, 2008 3:37 PM

## 2010-10-06 NOTE — Miscellaneous (Signed)
  Clinical Lists Changes  Allergies: Added new allergy or adverse reaction of ACE INHIBITORS Added new allergy or adverse reaction of * ARB

## 2010-10-06 NOTE — Progress Notes (Signed)
Summary: SWOLLEN TONGUE  Phone Note Call from Patient   Summary of Call: TONGUE IS SWOLLEN GOT UP THIS MORNING AND IT WAS SWOLLEN IT HAS GONE DOWN A LITTLE  IT HAS DONE THIS BEFORE AND YOU SENT SOMETHING WANTS TO KNOW WILL YOU DO THIS AGAIN  SHE HAS TO STAY WITH HER FATHER [facility] CALL BACK AND LET HER KNOW AT [phone] Initial call taken by: Lind Guest,  Jan 09, 2010 8:37 AM  Follow-up for Phone Call        i will erx prednisone dose pack, pls let her know, also gop to ED if no better Follow-up by: Syliva Overman MD,  Jan 09, 2010 11:04 AM  Additional Follow-up for Phone Call Additional follow up Details #1::        called and left message  Additional Follow-up by: Everitt Amber LPN,  Jan 09, 2705 1:26 PM    Additional Follow-up for Phone Call Additional follow up Details #2::    Patient aware Follow-up by: Everitt Amber LPN,  Jan 10, 2375 2:37 PM  New/Updated Medications: PREDNISONE (PAK) 5 MG TABS (PREDNISONE) Use as directed Prescriptions: PREDNISONE (PAK) 5 MG TABS (PREDNISONE) Use as directed  #21 x 0   Entered and Authorized by:   Syliva Overman MD   Signed by:   Syliva Overman MD on 01/09/2010   Method used:   Electronically to        [facility] (retail)       [address]       [place], [place]  [address]       Ph: [phone]       Fax: [phone]   RxID:   [id number]

## 2010-10-06 NOTE — Medication Information (Signed)
Summary: Tax adviser   Imported By: Lind Guest 05/28/2009 13:49:51  _____________________________________________________________________  External Attachment:    Type:   Image     Comment:   External Document

## 2010-10-07 ENCOUNTER — Encounter: Payer: Self-pay | Admitting: Family Medicine

## 2010-10-08 NOTE — Letter (Signed)
Summary: dose increase  dose increase   Imported By: Lind Guest 08/27/2010 08:00:35  _____________________________________________________________________  External Attachment:    Type:   Image     Comment:   External Document

## 2010-10-08 NOTE — Progress Notes (Signed)
Summary: SICK AND EYE  Phone Note Call from Patient   Summary of Call: CONGESTED, STUFFY NOSE, RUNNY EYES AND HER RIGHT EYE IS CLOSED WHAT CAN SHE TAKE  CALL BACK AT [phone] Initial call taken by: Lind Guest,  September 29, 2010 2:16 PM  Follow-up for Phone Call        advise claritin or zrtec, one daily which is OTC also ocean spray/normal saline washes to nostrils 2 to 3 times daily, pls ensure no fever , chills or green drainage Follow-up by: Syliva Overman MD,  September 29, 2010 4:57 PM  Additional Follow-up for Phone Call Additional follow up Details #1::        Phone Call Completed Additional Follow-up by: Adella Hare LPN,  September 29, 2010 4:59 PM

## 2010-10-08 NOTE — Assessment & Plan Note (Signed)
Summary: F UP   Vital Signs:  Patient profile:   77 year old female Menstrual status:  hysterectomy Height:      62 inches Weight:      156.50 pounds BMI:     28.73 O2 Sat:      97 % on Room air Pulse rate:   78 / minute Pulse rhythm:   regular Resp:     16 per minute BP sitting:   170 / 100  (left arm)  Vitals Entered By: Adella Hare LPN (August 26, 2010 1:21 PM)  Nutrition Counseling: Patient's BMI is greater than 25 and therefore counseled on weight management options.  O2 Flow:  Room air CC: follow-up visit Is Patient Diabetic? No   Primary Care Provider:  Lodema Hong  CC:  follow-up visit.  History of Present Illness: pt is tearful since she heard 3 weeks ago that her 32 y/o father has lung cancer. she has hospice and her grandson and other family members are supportive. poor memory and sleep, worried, anxious tearful, poor apetite, does not want antidepressants at this time and reports adequate family support  Denies recent fever or chills. Denies sinus pressure, nasal congestion , ear pain or sore throat. Denies chest congestion, or cough productive of sputum. Denies chest pain, palpitations, PND, orthopnea or leg swelling. Denies abdominal pain, nausea, vomitting, diarrhea or constipation. Denies change in bowel movements or bloody stool. Denies dysuria , frequency, incontinence or hesitancy. chronic back pain and stiffness Denies headaches, vertigo, seizures.  Denies  rash, lesions, or itch.     Current Medications (verified): 1)  Skelaxin 800 Mg  Tabs (Metaxalone) .... Take 1 Tablet By Mouth Three Times A Day 2)  Hydrocodone-Acetaminophen 7.5-750 Mg  Tabs (Hydrocodone-Acetaminophen) .... Take 1 Tablet By Mouth Four Times A Day 3)  Lipitor 20 Mg  Tabs (Atorvastatin Calcium) .... Take 1 Tab By Mouth At Bedtime 4)  Oscal 500/200 D-3 500-200 Mg-Unit  Tabs (Calcium-Vitamin D) .... Take 1 Tablet By Mouth Three Times A Day 5)  Aspirin 81 Mg  Tbec (Aspirin) ....  Take 1 Tablet By Mouth Once A Day 6)  Nitroquick 0.4 Mg  Subl (Nitroglycerin) .... Use As Directed 7)  Centrum Silver   Tabs (Multiple Vitamins-Minerals) .... Take 1 Tablet By Mouth Once A Day 8)  Metoprolol Tartrate 25 Mg  Tabs (Metoprolol Tartrate) .... Take One Half Tablet By Mouth Two Times A Day 9)  Oxycodone-Acetaminophen 7.5-325 Mg Tabs (Oxycodone-Acetaminophen) .... One Tab By Mouth At Bedtime. 10)  B-12 Dots 500 Mcg Subl (Cyanocobalamin) .... One Tab Under Tongue Once Daily 11)  Omeprazole 40 Mg Cpdr (Omeprazole) .... Take 1 Capsule By Mouth Once A Day 12)  Vitamin B6 200 Mg .Marland Kitchen.. 1 Tab Daily 13)  Gericare Iron .Marland Kitchen.. 1 Tab Daily 14)  Pure Vitamin C500 Mg .Marland Kitchen.. 1 Tab Daily 15)  Epipen 2-Pak 0.3 Mg/0.35ml Devi (Epinephrine) .... Use As Directed  Allergies (verified): 1)  ! Pcn 2)  ! Codeine 3)  ! Wellbutrin 4)  ! Ace Inhibitors 5)  ! * Arb  Review of Systems      See HPI Eyes:  Denies blurring, discharge, and double vision. MS:  Complains of joint pain, low back pain, mid back pain, and stiffness. Endo:  Denies cold intolerance, excessive hunger, excessive thirst, and excessive urination. Heme:  Denies abnormal bruising and bleeding. Allergy:  Denies hives or rash.  Physical Exam  General:  Well-developed,well-nourished,in no acute distress; alert,appropriate and cooperative throughout examination HEENT: No  facial asymmetry,  EOMI, No sinus tenderness, TM's Clear, oropharynx  pink and moist.   Chest: Clear to auscultation bilaterally.  CVS: S1, S2, No murmurs, No S3.   Abd: Soft, Nontender.  MS: decreased  ROM spine, hips, shoulders and knees.  Ext: No edema.   CNS: CN 2-12 intact, power tone and sensation normal throughout.   Skin: Intact, no visible lesions or rashes.  Psych: Good eye contact, normal affect.  Memory intact, tearful, anxious and depressed appearing   Impression & Recommendations:  Problem # 1:  HYPERTENSION (ICD-401.9) Assessment Deteriorated  The  following medications were removed from the medication list:    Metoprolol Tartrate 25 Mg Tabs (Metoprolol tartrate) .Marland Kitchen... Take one half tablet by mouth two times a day Her updated medication list for this problem includes:    Metoprolol Tartrate 25 Mg Tabs (Metoprolol tartrate) .Marland Kitchen... Take 1 tablet by mouth two times a day  Orders: Medicare Electronic Prescription [phone])  BP today: 170/100 Prior BP: 130/88 (06/25/2010)  Labs Reviewed: K+: 4.3 (06/24/2010) Creat: : 1.09 (06/24/2010)   Chol: 173 (06/24/2010)   HDL: 46 (06/24/2010)   LDL: 97 (06/24/2010)   TG: 152 (06/24/2010)  Problem # 2:  BACK PAIN WITH RADICULOPATHY (ICD-729.2) Assessment: Improved  Problem # 3:  HYPERLIPIDEMIA (ICD-272.4) Assessment: Unchanged  Her updated medication list for this problem includes:    Lipitor 20 Mg Tabs (Atorvastatin calcium) .Marland Kitchen... Take 1 tab by mouth at bedtime  Labs Reviewed: SGOT: 35 (06/24/2010)   SGPT: 21 (06/24/2010)   HDL:46 (06/24/2010), 53 (11/28/2009)  LDL:97 (06/24/2010), 74 (11/28/2009)  Chol:173 (06/24/2010), 144 (11/28/2009)  Trig:152 (06/24/2010), 83 (11/28/2009)  Problem # 4:  DEPRESSION (ICD-311) Assessment: Comment Only  Discussed treatment options, including trial of antidpressant medication. Will refer to behavioral health. Follow-up call in in 24-48 hours and recheck in 2 weeks, sooner as needed. Patient agrees to call if any worsening of symptoms or thoughts of doing harm arise. Verified that the patient has no suicidal ideation at this time.   Complete Medication List: 1)  Skelaxin 800 Mg Tabs (Metaxalone) .... Take 1 tablet by mouth three times a day 2)  Hydrocodone-acetaminophen 7.5-750 Mg Tabs (Hydrocodone-acetaminophen) .... Take 1 tablet by mouth four times a day 3)  Lipitor 20 Mg Tabs (Atorvastatin calcium) .... Take 1 tab by mouth at bedtime 4)  Oscal 500/200 D-3 500-200 Mg-unit Tabs (Calcium-vitamin d) .... Take 1 tablet by mouth three times a day 5)  Aspirin 81  Mg Tbec (Aspirin) .... Take 1 tablet by mouth once a day 6)  Nitroquick 0.4 Mg Subl (Nitroglycerin) .... Use as directed 7)  Centrum Silver Tabs (Multiple vitamins-minerals) .... Take 1 tablet by mouth once a day 8)  Oxycodone-acetaminophen 7.5-325 Mg Tabs (Oxycodone-acetaminophen) .... One tab by mouth at bedtime. 9)  B-12 Dots 500 Mcg Subl (Cyanocobalamin) .... One tab under tongue once daily 10)  Omeprazole 40 Mg Cpdr (Omeprazole) .... Take 1 capsule by mouth once a day 11)  Vitamin B6 200 Mg  .Marland Kitchen.. 1 tab daily 12)  Gericare Iron  .Marland Kitchen.. 1 tab daily 13)  Pure Vitamin C500 Mg  .Marland Kitchen.. 1 tab daily 14)  Epipen 2-pak 0.3 Mg/0.71ml Devi (Epinephrine) .... Use as directed 15)  Metoprolol Tartrate 25 Mg Tabs (Metoprolol tartrate) .... Take 1 tablet by mouth two times a day  Patient Instructions: 1)  Please schedule a follow-up appointment in 1 month. 2)  Your BP is high, pls increase your metoprolol to 25mg  ONE tWICE DAILY, starting  today.. Prescriptions: HYDROCODONE-ACETAMINOPHEN 7.5-750 MG  TABS (HYDROCODONE-ACETAMINOPHEN) Take 1 tablet by mouth four times a day  #120 x 3   Entered by:   Adella Hare LPN   Authorized by:   Syliva Overman MD   Signed by:   Adella Hare LPN on 44/11/4740   Method used:   Printed then faxed to ...       [facility] (retail)       [address]       [place], [place]  [address]       Ph: [phone]       Fax: [phone]   RxID:   [id number] OMEPRAZOLE 40 MG CPDR (OMEPRAZOLE) Take 1 capsule by mouth once a day  #30 Capsule x 3   Entered by:   Adella Hare LPN   Authorized by:   Syliva Overman MD   Signed by:   Adella Hare LPN on 02/54/2706   Method used:   Electronically to        [facility] (retail)       [address]       [place], [place]  [address]       Ph: [phone]       Fax: [phone]   RxID:   [id number] METOPROLOL TARTRATE 25 MG TABS (METOPROLOL TARTRATE) Take  1 tablet by mouth two times a day  #60 x 3   Entered and Authorized by:   Syliva Overman MD   Signed by:   Syliva Overman MD on 08/26/2010   Method used:   Printed then faxed to ...       [facility] (retail)       [address]       [place], [place]  [address]       Ph: [phone]       Fax: [phone]   RxID:   [id number]    Orders Added: 1)  Est. Patient Level IV [54008] 2)  Medicare Electronic Prescription [phone]

## 2010-10-08 NOTE — Progress Notes (Signed)
  Phone Note Call from Patient   Caller: Patient Summary of Call: grandson called and states patient's father just passed away, patient is very upset, bp is out of control was "200somthing over 100somthing" advised er or urgent care for eval and expressed our sympathy for her loss Initial call taken by: Adella Hare LPN,  September 10, 2010 9:48 AM  Follow-up for Phone Call        pls let pt know iam aware of her recent loss and am thinking about herand the family. pls sched oV next week for re-eval of bP, I see she went o the hospital Follow-up by: Syliva Overman MD,  September 16, 2010 5:14 AM  Additional Follow-up for Phone Call Additional follow up Details #1::        CALLED HER HOME AND HER DAUGHTER ANSWERED AND SAID THAT SHE WAS OUT TAKING  CARE OF BUSINESS WITH OF HER FATHERS FUNERAL AND THAT SHE WOULD CALL BACK TO MAKE AN APPOINMENT SHE WOULD TELL HER SO Additional Follow-up by: Lind Guest,  September 16, 2010 2:22 PM    Additional Follow-up for Phone Call Additional follow up Details #2::    noted Follow-up by: Syliva Overman MD,  September 17, 2010 5:32 AM

## 2010-10-22 NOTE — Assessment & Plan Note (Signed)
Summary: PER DR BP F UP FROM HOS   Vital Signs:  Patient profile:   77 year old female Menstrual status:  hysterectomy Height:      62 inches Weight:      155.25 pounds BMI:     28.50 O2 Sat:      98 % Pulse rate:   58 / minute Pulse rhythm:   regular Resp:     16 per minute BP sitting:   170 / 94  (left arm) Cuff size:   large  Vitals Entered By: Everitt Amber LPN (September 24, 2010 1:37 PM)  Nutrition Counseling: Patient's BMI is greater than 25 and therefore counseled on weight management options. CC: ER follow up, elevated BP   Primary Care Provider:  Lodema Hong  CC:  ER follow up and elevated BP.  History of Present Illness: pt recnetly lost her ill father and has been under increased stress as a result. she reports "losing it" but has great support from family and friends. she is here for f/u of uncontrolled blooReports  that they are doing well. Denies recent fever or chills. Denies sinus pressure, nasal congestion , ear pain or sore throat. Denies chest congestion, or cough productive of sputum. Denies chest pain, palpitations, PND, orthopnea or leg swelling. Denies abdominal pain, nausea, vomitting, diarrhea or constipation. Denies change in bowel movements or bloody stool. Denies dysuria , frequency, incontinence or hesitancy. chronic back pain,maintained off of narcotics since her life threatening anaphylaxis Denies headaches, vertigo, seizures.  Denies  rash, lesions, or itch.     Current Medications (verified): 1)  Skelaxin 800 Mg  Tabs (Metaxalone) .... Take 1 Tablet By Mouth Three Times A Day 2)  Hydrocodone-Acetaminophen 7.5-750 Mg  Tabs (Hydrocodone-Acetaminophen) .... Take 1 Tablet By Mouth Four Times A Day 3)  Lipitor 20 Mg  Tabs (Atorvastatin Calcium) .... Take 1 Tab By Mouth At Bedtime 4)  Oscal 500/200 D-3 500-200 Mg-Unit  Tabs (Calcium-Vitamin D) .... Take 1 Tablet By Mouth Three Times A Day 5)  Aspirin 81 Mg  Tbec (Aspirin) .... Take 1 Tablet By  Mouth Once A Day 6)  Nitroquick 0.4 Mg  Subl (Nitroglycerin) .... Use As Directed 7)  Centrum Silver   Tabs (Multiple Vitamins-Minerals) .... Take 1 Tablet By Mouth Once A Day 8)  B-12 Dots 500 Mcg Subl (Cyanocobalamin) .... One Tab Under Tongue Once Daily 9)  Omeprazole 40 Mg Cpdr (Omeprazole) .... Take 1 Capsule By Mouth Once A Day 10)  Vitamin B6 200 Mg .Marland Kitchen.. 1 Tab Daily 11)  Gericare Iron .Marland Kitchen.. 1 Tab Daily 12)  Pure Vitamin C500 Mg .Marland Kitchen.. 1 Tab Daily 13)  Epipen 2-Pak 0.3 Mg/0.60ml Devi (Epinephrine) .... Use As Directed 14)  Metoprolol Tartrate 25 Mg Tabs (Metoprolol Tartrate) .... Take 1 Tablet By Mouth Two Times A Day  Allergies (verified): 1)  ! Pcn 2)  ! Codeine 3)  ! Wellbutrin 4)  ! Ace Inhibitors 5)  ! * Arb  Past History:  Family history reviewed for relevance to current acute and chronic problems.  Family History: Reviewed history from 07/09/2009 and no changes required. Father: deceased at age 15 with dementia, in 01/22/2011 Mother:deceased- anuerysm, cva  Siblings: no siblings No FH of Colon Cancer:  Review of Systems      See HPI General:  Complains of fatigue and sleep disorder. Eyes:  Denies discharge and red eye. MS:  Complains of joint pain, low back pain, mid back pain, and stiffness. Psych:  Complains of  anxiety and depression. Endo:  Denies excessive thirst, excessive urination, and heat intolerance. Heme:  Denies abnormal bruising and enlarge lymph nodes. Allergy:  Denies hives or rash and itching eyes.  Physical Exam  General:  Well-developed,well-nourished,in no acute distress; alert,appropriate and cooperative throughout examination HEENT: No facial asymmetry,  EOMI, No sinus tenderness, TM's Clear, oropharynx  pink and moist.   Chest: Clear to auscultation bilaterally.  CVS: S1, S2, No murmurs, No S3.   Abd: Soft, Nontender.  MS: decreased  ROM spine, and hips adequate in  shoulders and knees.  Ext: No edema.   CNS: CN 2-12 intact, power tone and  sensation normal throughout.   Skin: Intact, no visible lesions or rashes.  Psych: Good eye contact, normal affect.  Memory intact, mildly depressed appearing.    Impression & Recommendations:  Problem # 1:  DEPRESSION (ICD-311) Assessment Deteriorated pt encouraged to verbalize her feelings of loss, and ventillated for approx , okwithout meds, normal grief  Problem # 2:  HYPERTENSION (ICD-401.9) Assessment: Unchanged  Her updated medication list for this problem includes:    Metoprolol Tartrate 25 Mg Tabs (Metoprolol tartrate) .Marland Kitchen... Take 1 tablet by mouth two times a day    Amlodipine Besylate 5 Mg Tabs (Amlodipine besylate) .Marland Kitchen... Take 1 tablet by mouth once a day  Orders: Medicare Electronic Prescription [phone]) T-Basic Metabolic Panel ([id number])  BP today: 170/94 Prior BP: 170/100 (08/26/2010)  Labs Reviewed: K+: 4.3 (06/24/2010) Creat: : 1.09 (06/24/2010)   Chol: 173 (06/24/2010)   HDL: 46 (06/24/2010)   LDL: 97 (06/24/2010)   TG: 152 (06/24/2010)  Problem # 3:  HYPERLIPIDEMIA (ICD-272.4) Assessment: Comment Only  Her updated medication list for this problem includes: .lip1    Lipitor 20 Mg Tabs (Atorvastatin calcium) .Marland Kitchen... Take 1 tab by mouth at bedtime  Labs Reviewed: SGOT: 35 (06/24/2010)   SGPT: 21 (06/24/2010)   HDL:46 (06/24/2010), 53 (11/28/2009)  LDL:97 (06/24/2010), 74 (11/28/2009)  Chol:173 (06/24/2010), 144 (11/28/2009)  Trig:152 (06/24/2010), 83 (11/28/2009)  Complete Medication List: 1)  Skelaxin 800 Mg Tabs (Metaxalone) .... Take 1 tablet by mouth three times a day 2)  Hydrocodone-acetaminophen 7.5-750 Mg Tabs (Hydrocodone-acetaminophen) .... Take 1 tablet by mouth four times a day 3)  Lipitor 20 Mg Tabs (Atorvastatin calcium) .... Take 1 tab by mouth at bedtime 4)  Oscal 500/200 D-3 500-200 Mg-unit Tabs (Calcium-vitamin d) .... Take 1 tablet by mouth three times a day 5)  Aspirin 81 Mg Tbec (Aspirin) .... Take 1 tablet by mouth once a day 6)   Nitroquick 0.4 Mg Subl (Nitroglycerin) .... Use as directed 7)  Centrum Silver Tabs (Multiple vitamins-minerals) .... Take 1 tablet by mouth once a day 8)  B-12 Dots 500 Mcg Subl (Cyanocobalamin) .... One tab under tongue once daily 9)  Omeprazole 40 Mg Cpdr (Omeprazole) .... Take 1 capsule by mouth once a day 10)  Vitamin B6 200 Mg  .Marland Kitchen.. 1 tab daily 11)  Gericare Iron  .Marland Kitchen.. 1 tab daily 12)  Pure Vitamin C500 Mg  .Marland Kitchen.. 1 tab daily 13)  Epipen 2-pak 0.3 Mg/0.22ml Devi (Epinephrine) .... Use as directed 14)  Metoprolol Tartrate 25 Mg Tabs (Metoprolol tartrate) .... Take 1 tablet by mouth two times a day 15)  Amlodipine Besylate 5 Mg Tabs (Amlodipine besylate) .... Take 1 tablet by mouth once a day  Other Orders: T- Hemoglobin A1C ([id number]) T-TSH ([id number])  Patient Instructions: 1)  Please schedule a follow-up appointment in 1 month. 2)  your blood pressure is high, so  pls start the amlodipine 1 at bedtime , and continue the metoprolol one twice daily as before 3)  hBA1c , tSH and chem 7 today 4)  pls call for an epidural injection. 5)  pls call if you need any help in the interim Prescriptions: LIPITOR 20 MG  TABS (ATORVASTATIN CALCIUM) Take 1 tab by mouth at bedtime  #30 Tablet x 3   Entered by:   Adella Hare LPN   Authorized by:   Syliva Overman MD   Signed by:   Adella Hare LPN on 16/06/9603   Method used:   Electronically to        [facility] (retail)       [address]       [place], [place]  [address]       Ph: [phone]       Fax: [phone]   RxID:   [id number] SKELAXIN 800 MG  TABS (METAXALONE) Take 1 tablet by mouth three times a day  #90 Tablet x 3   Entered by:   Adella Hare LPN   Authorized by:   Syliva Overman MD   Signed by:   Adella Hare LPN on 07/03/2535   Method used:   Electronically to        [facility] (retail)       [address]       [place], [place]  [address]        Ph: [phone]       Fax: [phone]   RxID:   [id number] AMLODIPINE BESYLATE 5 MG TABS (AMLODIPINE BESYLATE) Take 1 tablet by mouth once a day  #30 x 2   Entered and Authorized by:   Syliva Overman MD   Signed by:   Syliva Overman MD on 09/24/2010   Method used:   Electronically to        [facility] (retail)       [address]       [place], [place]  [address]       Ph: [phone]       Fax: [phone]   RxID:   [id number]    Orders Added: 1)  Est. Patient Level IV [48546] 2)  Medicare Electronic Prescription [G8553] 3)  T-Basic Metabolic Panel [[id number]] 4)  T- Hemoglobin A1C [[id number]] 5)  T-TSH [[id number]]

## 2010-10-28 ENCOUNTER — Ambulatory Visit (INDEPENDENT_AMBULATORY_CARE_PROVIDER_SITE_OTHER): Payer: Medicare Other | Admitting: Family Medicine

## 2010-10-28 ENCOUNTER — Encounter: Payer: Self-pay | Admitting: Family Medicine

## 2010-10-28 DIAGNOSIS — K219 Gastro-esophageal reflux disease without esophagitis: Secondary | ICD-10-CM

## 2010-10-28 DIAGNOSIS — R7301 Impaired fasting glucose: Secondary | ICD-10-CM | POA: Insufficient documentation

## 2010-10-28 DIAGNOSIS — I10 Essential (primary) hypertension: Secondary | ICD-10-CM

## 2010-10-28 DIAGNOSIS — F3289 Other specified depressive episodes: Secondary | ICD-10-CM

## 2010-10-28 DIAGNOSIS — E785 Hyperlipidemia, unspecified: Secondary | ICD-10-CM

## 2010-10-28 DIAGNOSIS — F329 Major depressive disorder, single episode, unspecified: Secondary | ICD-10-CM

## 2010-11-03 ENCOUNTER — Telehealth (INDEPENDENT_AMBULATORY_CARE_PROVIDER_SITE_OTHER): Payer: Self-pay | Admitting: *Deleted

## 2010-11-03 ENCOUNTER — Other Ambulatory Visit: Payer: Self-pay | Admitting: Family Medicine

## 2010-11-03 DIAGNOSIS — M549 Dorsalgia, unspecified: Secondary | ICD-10-CM

## 2010-11-09 ENCOUNTER — Ambulatory Visit
Admission: RE | Admit: 2010-11-09 | Discharge: 2010-11-09 | Disposition: A | Discharge status: Home / Self Care | Payer: Medicare Other | Source: Ambulatory Visit | Attending: Family Medicine | Admitting: Family Medicine

## 2010-11-09 ENCOUNTER — Other Ambulatory Visit: Payer: Self-pay | Admitting: Family Medicine

## 2010-11-09 DIAGNOSIS — M549 Dorsalgia, unspecified: Secondary | ICD-10-CM

## 2010-11-12 NOTE — Progress Notes (Signed)
Summary: referral  Phone Note Call from Patient   Summary of Call: pt needs a referral for epidural injections. [phone]   Is this okay to do Initial call taken by: Rudene Anda,  November 03, 2010 11:40 AM  Follow-up for Phone Call        yes pls refer, I thought in the past said not necessary, wants a particular doc to do it at the same facility she was at Follow-up by: Syliva Overman MD,  November 03, 2010 11:53 AM  Additional Follow-up for Phone Call Additional follow up Details #1::        sent referral over to [facility]. They will call pt with appt and time.  Additional Follow-up by: Rudene Anda,  November 03, 2010 1:43 PM

## 2010-11-12 NOTE — Assessment & Plan Note (Signed)
Summary: FOLLOW UP   Vital Signs:  Patient profile:   77 year old female Menstrual status:  hysterectomy Height:      62 inches Weight:      157.25 pounds BMI:     28.87 O2 Sat:      98 % Pulse rate:   75 / minute Resp:     16 per minute BP sitting:   134 / 86  (left arm)  Vitals Entered By: Everitt Amber LPN (October 28, 2010 3:44 PM)  Nutrition Counseling: Patient's BMI is greater than 25 and therefore counseled on weight management options. CC: Follow up chronic problems   Primary Care Provider:  Lodema Hong  CC:  Follow up chronic problems.  History of Present Illness: Reports  that she is doing fairly well. she denies adverse s/e fromher bp meds,specifically loightheadedness, swelling, cramps or swelling Denies recent fever or chills. Denies sinus pressure, nasal congestion , ear pain or sore throat. Denies chest congestion, or cough productive of sputum. Denies chest pain, palpitations, PND, orthopnea or leg swelling. Denies abdominal pain, nausea, vomitting, diarrhea or constipation. Denies change in bowel movements or bloody stool. Denies dysuria , frequency, incontinence or hesitancy.  Denies headaches, vertigo, seizures.  Denies  rash, lesions, or itch.     Current Medications (verified): 1)  Skelaxin 800 Mg  Tabs (Metaxalone) .... Take 1 Tablet By Mouth Three Times A Day 2)  Hydrocodone-Acetaminophen 7.5-750 Mg  Tabs (Hydrocodone-Acetaminophen) .... Take 1 Tablet By Mouth Four Times A Day 3)  Lipitor 20 Mg  Tabs (Atorvastatin Calcium) .... Take 1 Tab By Mouth At Bedtime 4)  Oscal 500/200 D-3 500-200 Mg-Unit  Tabs (Calcium-Vitamin D) .... Take 1 Tablet By Mouth Three Times A Day 5)  Aspirin 81 Mg  Tbec (Aspirin) .... Take 1 Tablet By Mouth Once A Day 6)  Nitroquick 0.4 Mg  Subl (Nitroglycerin) .... Use As Directed 7)  Centrum Silver   Tabs (Multiple Vitamins-Minerals) .... Take 1 Tablet By Mouth Once A Day 8)  B-12 Dots 500 Mcg Subl (Cyanocobalamin) .... One Tab  Under Tongue Once Daily 9)  Omeprazole 40 Mg Cpdr (Omeprazole) .... Take 1 Capsule By Mouth Once A Day 10)  Vitamin B6 200 Mg .Marland Kitchen.. 1 Tab Daily 11)  Gericare Iron .Marland Kitchen.. 1 Tab Daily 12)  Pure Vitamin C500 Mg .Marland Kitchen.. 1 Tab Daily 13)  Epipen 2-Pak 0.3 Mg/0.65ml Devi (Epinephrine) .... Use As Directed 14)  Metoprolol Tartrate 25 Mg Tabs (Metoprolol Tartrate) .... Take 1 Tablet By Mouth Two Times A Day 15)  Amlodipine Besylate 5 Mg Tabs (Amlodipine Besylate) .... Take 1 Tablet By Mouth Once A Day 16)  Vitamin C 500 Mg Tabs (Ascorbic Acid) .... Take 1 Tablet By Mouth Once A Day  Allergies (verified): 1)  ! Pcn 2)  ! Codeine 3)  ! Wellbutrin 4)  ! Ace Inhibitors 5)  ! * Arb  Review of Systems      See HPI General:  Complains of fatigue. Eyes:  Denies discharge, eye pain, and red eye. MS:  Complains of joint pain, low back pain, mid back pain, muscle weakness, and stiffness. Psych:  Complains of depression and easily tearful; denies mental problems, panic attacks, suicidal thoughts/plans, thoughts of violence, and unusual visions or sounds; pt dealing with father's death well, she is also under increased stress due to her daghter's behav, she is an untreated mentaly illindividual. Endo:  Denies cold intolerance, excessive hunger, excessive thirst, excessive urination, and heat intolerance. Heme:  Denies  abnormal bruising. Allergy:  Complains of seasonal allergies.  Physical Exam  General:  Well-developed,well-nourished,in no acute distress; alert,appropriate and cooperative throughout examination HEENT: No facial asymmetry,  EOMI, No sinus tenderness, TM's Clear, oropharynx  pink and moist.   Chest: Clear to auscultation bilaterally.  CVS: S1, S2, No murmurs, No S3.   Abd: Soft, Nontender.  MS: decreased  ROM spine, hips, shoulders and knees.  Ext: No edema.   CNS: CN 2-12 intact, power tone and sensation normal throughout.   Skin: Intact, no visible lesions or rashes.  Psych: Good eye  contact, normal affect.  Memory intact, not anxious or depressed appearing.    Impression & Recommendations:  Problem # 1:  HYPERTENSION (ICD-401.9) Assessment Improved  Her updated medication list for this problem includes:    Metoprolol Tartrate 25 Mg Tabs (Metoprolol tartrate) .Marland Kitchen... Take 1 tablet by mouth two times a day    Amlodipine Besylate 5 Mg Tabs (Amlodipine besylate) .Marland Kitchen... Take 1 tablet by mouth once a day Patient advised to follow low sodium diet rich in fruit and vegetables, and to commit to at least 30 minutes 5 days per week of regular exercise , to improve blood presure control.   BP today: 134/86 Prior BP: 170/94 (09/24/2010)  Labs Reviewed: K+: 4.1 (09/24/2010) Creat: : 0.88 (09/24/2010)   Chol: 173 (06/24/2010)   HDL: 46 (06/24/2010)   LDL: 97 (06/24/2010)   TG: 152 (06/24/2010)  Orders: T-Basic Metabolic Panel [id number])  Problem # 2:  GERD (ICD-530.81) Assessment: Improved  Her updated medication list for this problem includes:    Omeprazole 40 Mg Cpdr (Omeprazole) .Marland Kitchen... Take 1 capsule by mouth once a day  Problem # 3:  HYPERLIPIDEMIA (ICD-272.4) Assessment: Comment Only  Her updated medication list for this problem includes:    Lipitor 20 Mg Tabs (Atorvastatin calcium) .Marland Kitchen... Take 1 tab by mouth at bedtime Low fat dietdiscussed and encouraged  Labs Reviewed: SGOT: 35 (06/24/2010)   SGPT: 21 (06/24/2010)   HDL:46 (06/24/2010), 53 (11/28/2009)  LDL:97 (06/24/2010), 74 (11/28/2009)  Chol:173 (06/24/2010), 144 (11/28/2009)  Trig:152 (06/24/2010), 83 (11/28/2009)  Orders: Medicare Electronic Prescription (N8295)  Complete Medication List: 1)  Skelaxin 800 Mg Tabs (Metaxalone) .... Take 1 tablet by mouth three times a day 2)  Hydrocodone-acetaminophen 7.5-750 Mg Tabs (Hydrocodone-acetaminophen) .... Take 1 tablet by mouth four times a day 3)  Lipitor 20 Mg Tabs (Atorvastatin calcium) .... Take 1 tab by mouth at bedtime 4)  Oscal 500/200 D-3 500-200  Mg-unit Tabs (Calcium-vitamin d) .... Take 1 tablet by mouth three times a day 5)  Aspirin 81 Mg Tbec (Aspirin) .... Take 1 tablet by mouth once a day 6)  Nitroquick 0.4 Mg Subl (Nitroglycerin) .... Use as directed 7)  Centrum Silver Tabs (Multiple vitamins-minerals) .... Take 1 tablet by mouth once a day 8)  B-12 Dots 500 Mcg Subl (Cyanocobalamin) .... One tab under tongue once daily 9)  Omeprazole 40 Mg Cpdr (Omeprazole) .... Take 1 capsule by mouth once a day 10)  Vitamin B6 200 Mg  .Marland Kitchen.. 1 tab daily 11)  Gericare Iron  .Marland Kitchen.. 1 tab daily 12)  Pure Vitamin C500 Mg  .Marland Kitchen.. 1 tab daily 13)  Epipen 2-pak 0.3 Mg/0.29ml Devi (Epinephrine) .... Use as directed 14)  Metoprolol Tartrate 25 Mg Tabs (Metoprolol tartrate) .... Take 1 tablet by mouth two times a day 15)  Amlodipine Besylate 5 Mg Tabs (Amlodipine besylate) .... Take 1 tablet by mouth once a day 16)  Vitamin C 500 Mg  Tabs (Ascorbic acid) .... Take 1 tablet by mouth once a day  Other Orders: T- Hemoglobin A1C ([id number])  Patient Instructions: 1)  F/U in early May 2)  HbgA1C prior to visit, ICD-9: 3)  BMP prior to visit, ICD-9:   early May 4)  No med changes Prescriptions: LIPITOR 20 MG  TABS (ATORVASTATIN CALCIUM) Take 1 tab by mouth at bedtime  #30 Tablet x 3   Entered by:   Everitt Amber LPN   Authorized by:   Syliva Overman MD   Signed by:   Everitt Amber LPN on 14/78/2956   Method used:   Printed then faxed to ...       [facility] [address] ([facility]ail)       [address]       [place], [place]  [address]       Ph: [phone]       Fax: [phone]   RxID:   [id number] HYDROCODONE-ACETAMINOPHEN 7.5-750 MG  TABS (HYDROCODONE-ACETAMINOPHEN) Take 1 tablet by mouth four times a day  #120 x 3   Entered by:   Everitt Amber LPN   Authorized by:   Syliva Overman MD   Signed by:   Everitt Amber LPN on 25/95/6387   Method used:   Printed then faxed to ...       [facility] [address]       [address]       [place], [place]  [address]       Ph: [phone]       Fax: [phone]   RxID:   [id number] SKELAXIN 800 MG  TABS (METAXALONE) Take 1 tablet by mouth three times a day  #90 Tablet x 3   Entered by:   Everitt Amber LPN   Authorized by:   Syliva Overman MD   Signed by:   Everitt Amber LPN on 62/37/6283   Method used:   Printed then faxed to ...       [facility] [address] (retail)       [address]       [place], [place]  [address]       Ph: [phone]       Fax: [phone]   RxID:   [id number]    Orders Added: 1)  Est. Patient Level IV [99214] 2)  T-Basic Metabolic Panel [[id number]] 3)  T- Hemoglobin A1C [[id number]] 4)  Medicare Electronic Prescription [phone]

## 2010-11-18 LAB — CBC
HCT: 29.1 % — ABNORMAL LOW (ref 36.0–46.0)
HCT: 29.2 % — ABNORMAL LOW (ref 36.0–46.0)
HCT: 29.2 % — ABNORMAL LOW (ref 36.0–46.0)
HCT: 30.2 % — ABNORMAL LOW (ref 36.0–46.0)
HCT: 31.9 % — ABNORMAL LOW (ref 36.0–46.0)
Hemoglobin: 10.1 g/dL — ABNORMAL LOW (ref 12.0–15.0)
Hemoglobin: 10.6 g/dL — ABNORMAL LOW (ref 12.0–15.0)
Hemoglobin: 9.6 g/dL — ABNORMAL LOW (ref 12.0–15.0)
Hemoglobin: 9.8 g/dL — ABNORMAL LOW (ref 12.0–15.0)
Hemoglobin: 9.8 g/dL — ABNORMAL LOW (ref 12.0–15.0)
MCH: 29.1 pg (ref 26.0–34.0)
MCH: 29.4 pg (ref 26.0–34.0)
MCH: 29.5 pg (ref 26.0–34.0)
MCH: 29.5 pg (ref 26.0–34.0)
MCH: 29.6 pg (ref 26.0–34.0)
MCHC: 33 g/dL (ref 30.0–36.0)
MCHC: 33.2 g/dL (ref 30.0–36.0)
MCHC: 33.3 g/dL (ref 30.0–36.0)
MCHC: 33.3 g/dL (ref 30.0–36.0)
MCHC: 33.3 g/dL (ref 30.0–36.0)
MCV: 87.6 fL (ref 78.0–100.0)
MCV: 88.2 fL (ref 78.0–100.0)
MCV: 88.4 fL (ref 78.0–100.0)
MCV: 88.6 fL (ref 78.0–100.0)
MCV: 89.5 fL (ref 78.0–100.0)
Platelets: 160 10*3/uL (ref 150–400)
Platelets: 176 10*3/uL (ref 150–400)
Platelets: 178 K/uL (ref 150–400)
Platelets: 179 10*3/uL (ref 150–400)
Platelets: 195 10*3/uL (ref 150–400)
RBC: 3.26 MIL/uL — ABNORMAL LOW (ref 3.87–5.11)
RBC: 3.3 MIL/uL — ABNORMAL LOW (ref 3.87–5.11)
RBC: 3.32 MIL/uL — ABNORMAL LOW (ref 3.87–5.11)
RBC: 3.42 MIL/uL — ABNORMAL LOW (ref 3.87–5.11)
RBC: 3.64 MIL/uL — ABNORMAL LOW (ref 3.87–5.11)
RDW: 13.8 % (ref 11.5–15.5)
RDW: 13.9 % (ref 11.5–15.5)
RDW: 14.3 % (ref 11.5–15.5)
RDW: 14.4 % (ref 11.5–15.5)
RDW: 14.4 % (ref 11.5–15.5)
WBC: 10.5 10*3/uL (ref 4.0–10.5)
WBC: 11.5 K/uL — ABNORMAL HIGH (ref 4.0–10.5)
WBC: 13.5 10*3/uL — ABNORMAL HIGH (ref 4.0–10.5)
WBC: 14.6 10*3/uL — ABNORMAL HIGH (ref 4.0–10.5)
WBC: 9.4 10*3/uL (ref 4.0–10.5)

## 2010-11-18 LAB — RETICULOCYTES
RBC.: 3.6 MIL/uL — ABNORMAL LOW (ref 3.87–5.11)
Retic Count, Absolute: 36 10*3/uL (ref 19.0–186.0)
Retic Ct Pct: 1 % (ref 0.4–3.1)

## 2010-11-18 LAB — BASIC METABOLIC PANEL
BUN: 22 mg/dL (ref 6–23)
BUN: 24 mg/dL — ABNORMAL HIGH (ref 6–23)
CO2: 21 mEq/L (ref 19–32)
CO2: 22 mEq/L (ref 19–32)
Calcium: 8.6 mg/dL (ref 8.4–10.5)
Calcium: 8.7 mg/dL (ref 8.4–10.5)
Chloride: 111 mEq/L (ref 96–112)
Chloride: 114 mEq/L — ABNORMAL HIGH (ref 96–112)
Creatinine, Ser: 1.06 mg/dL (ref 0.4–1.2)
Creatinine, Ser: 1.08 mg/dL (ref 0.4–1.2)
GFR calc Af Amer: >60 mL/min (ref >60)
GFR calc Af Amer: >60 mL/min (ref >60)
GFR calc non Af Amer: 50 mL/min — ABNORMAL LOW (ref >60)
GFR calc non Af Amer: 51 mL/min — ABNORMAL LOW (ref >60)
Glucose, Bld: 127 mg/dL — ABNORMAL HIGH (ref 70–99)
Glucose, Bld: 159 mg/dL — ABNORMAL HIGH (ref 70–99)
Potassium: 3.5 mEq/L (ref 3.5–5.1)
Potassium: 4.6 mEq/L (ref 3.5–5.1)
Sodium: 141 mEq/L (ref 135–145)
Sodium: 143 mEq/L (ref 135–145)

## 2010-11-18 LAB — URINE CULTURE
Colony Count: NO GROWTH
Culture  Setup Time: 201110022057
Culture: NO GROWTH
Special Requests: NEGATIVE

## 2010-11-18 LAB — BASIC METABOLIC PANEL WITH GFR
BUN: 11 mg/dL (ref 6–23)
BUN: 17 mg/dL (ref 6–23)
BUN: 26 mg/dL — ABNORMAL HIGH (ref 6–23)
CO2: 25 meq/L (ref 19–32)
CO2: 27 meq/L (ref 19–32)
CO2: 28 meq/L (ref 19–32)
Calcium: 8.4 mg/dL (ref 8.4–10.5)
Calcium: 8.5 mg/dL (ref 8.4–10.5)
Calcium: 8.6 mg/dL (ref 8.4–10.5)
Chloride: 108 meq/L (ref 96–112)
Chloride: 109 meq/L (ref 96–112)
Chloride: 112 meq/L (ref 96–112)
Creatinine, Ser: 0.92 mg/dL (ref 0.4–1.2)
Creatinine, Ser: 0.93 mg/dL (ref 0.4–1.2)
Creatinine, Ser: 1.17 mg/dL (ref 0.4–1.2)
GFR calc non Af Amer: 45 mL/min — ABNORMAL LOW
GFR calc non Af Amer: 59 mL/min — ABNORMAL LOW
GFR calc non Af Amer: 60 mL/min — ABNORMAL LOW
Glucose, Bld: 92 mg/dL (ref 70–99)
Glucose, Bld: 96 mg/dL (ref 70–99)
Glucose, Bld: 98 mg/dL (ref 70–99)
Potassium: 3.4 meq/L — ABNORMAL LOW (ref 3.5–5.1)
Potassium: 3.7 meq/L (ref 3.5–5.1)
Potassium: 3.9 meq/L (ref 3.5–5.1)
Sodium: 140 meq/L (ref 135–145)
Sodium: 141 meq/L (ref 135–145)
Sodium: 143 meq/L (ref 135–145)

## 2010-11-18 LAB — LIPID PANEL
Cholesterol: 164 mg/dL (ref 0–200)
Cholesterol: 209 mg/dL — ABNORMAL HIGH (ref 0–200)
HDL: 44 mg/dL (ref >39)
HDL: 47 mg/dL (ref >39)
LDL Cholesterol: 118 mg/dL — ABNORMAL HIGH (ref 0–99)
LDL Cholesterol: 90 mg/dL (ref 0–99)
Total CHOL/HDL Ratio: 3.7 RATIO
Total CHOL/HDL Ratio: 4.4 RATIO
Triglycerides: 151 mg/dL — ABNORMAL HIGH (ref <150)
Triglycerides: 219 mg/dL — ABNORMAL HIGH (ref <150)
VLDL: 30 mg/dL (ref 0–40)
VLDL: 44 mg/dL — ABNORMAL HIGH (ref 0–40)

## 2010-11-18 LAB — MAGNESIUM: Magnesium: 1.7 mg/dL (ref 1.5–2.5)

## 2010-11-18 LAB — BLOOD GAS, ARTERIAL
Acid-base deficit: 0.5 mmol/L (ref 0.0–2.0)
Acid-base deficit: 1.2 mmol/L (ref 0.0–2.0)
Acid-base deficit: 2.8 mmol/L — ABNORMAL HIGH (ref 0.0–2.0)
Bicarbonate: 20.8 mEq/L (ref 20.0–24.0)
Bicarbonate: 21.4 mEq/L (ref 20.0–24.0)
Bicarbonate: 22 mEq/L (ref 20.0–24.0)
FIO2: 0.35 %
FIO2: 0.35 %
FIO2: 0.35 %
MECHVT: 550 mL
MECHVT: 550 mL
MECHVT: 600 mL
O2 Saturation: 97.2 %
O2 Saturation: 97.5 %
O2 Saturation: 98.4 %
PEEP: 5 cmH2O
PEEP: 5 cmH2O
PEEP: 5 cmH2O
Patient temperature: 37
Patient temperature: 37
Patient temperature: 38
RATE: 10 resp/min
RATE: 10 resp/min
RATE: 14 resp/min
TCO2: 20 mmol/L (ref 0–100)
pCO2 arterial: 22.8 mmHg — ABNORMAL LOW (ref 35.0–45.0)
pCO2 arterial: 30.4 mmHg — ABNORMAL LOW (ref 35.0–45.0)
pCO2 arterial: 33.5 mmHg — ABNORMAL LOW (ref 35.0–45.0)
pH, Arterial: 7.415 — ABNORMAL HIGH (ref 7.350–7.400)
pH, Arterial: 7.473 — ABNORMAL HIGH (ref 7.350–7.400)
pH, Arterial: 7.579 — ABNORMAL HIGH (ref 7.350–7.400)
pO2, Arterial: 101 mmHg — ABNORMAL HIGH (ref 80.0–100.0)
pO2, Arterial: 117 mmHg — ABNORMAL HIGH (ref 80.0–100.0)
pO2, Arterial: 124 mmHg — ABNORMAL HIGH (ref 80.0–100.0)

## 2010-11-18 LAB — DIFFERENTIAL
Basophils Absolute: 0 10*3/uL (ref 0.0–0.1)
Basophils Absolute: 0 10*3/uL (ref 0.0–0.1)
Basophils Absolute: 0.2 K/uL — ABNORMAL HIGH (ref 0.0–0.1)
Basophils Relative: 0 % (ref 0–1)
Basophils Relative: 0 % (ref 0–1)
Basophils Relative: 2 % — ABNORMAL HIGH (ref 0–1)
Eosinophils Absolute: 0 10*3/uL (ref 0.0–0.7)
Eosinophils Absolute: 0 10*3/uL (ref 0.0–0.7)
Eosinophils Absolute: 0 K/uL (ref 0.0–0.7)
Eosinophils Relative: 0 % (ref 0–5)
Eosinophils Relative: 0 % (ref 0–5)
Eosinophils Relative: 0 % (ref 0–5)
Lymphocytes Relative: 12 % (ref 12–46)
Lymphocytes Relative: 29 % (ref 12–46)
Lymphocytes Relative: 8 % — ABNORMAL LOW (ref 12–46)
Lymphs Abs: 1.2 10*3/uL (ref 0.7–4.0)
Lymphs Abs: 1.3 10*3/uL (ref 0.7–4.0)
Lymphs Abs: 3.3 K/uL (ref 0.7–4.0)
Monocytes Absolute: 0.1 10*3/uL (ref 0.1–1.0)
Monocytes Absolute: 0.3 10*3/uL (ref 0.1–1.0)
Monocytes Absolute: 0.5 K/uL (ref 0.1–1.0)
Monocytes Relative: 1 % — ABNORMAL LOW (ref 3–12)
Monocytes Relative: 2 % — ABNORMAL LOW (ref 3–12)
Monocytes Relative: 5 % (ref 3–12)
Neutro Abs: 13 10*3/uL — ABNORMAL HIGH (ref 1.7–7.7)
Neutro Abs: 7.5 K/uL (ref 1.7–7.7)
Neutro Abs: 9.1 10*3/uL — ABNORMAL HIGH (ref 1.7–7.7)
Neutrophils Relative %: 65 % (ref 43–77)
Neutrophils Relative %: 87 % — ABNORMAL HIGH (ref 43–77)
Neutrophils Relative %: 89 % — ABNORMAL HIGH (ref 43–77)

## 2010-11-18 LAB — CARDIAC PANEL(CRET KIN+CKTOT+MB+TROPI)
CK, MB: 2 ng/mL (ref 0.3–4.0)
CK, MB: 3.1 ng/mL (ref 0.3–4.0)
CK, MB: 5.3 ng/mL — ABNORMAL HIGH (ref 0.3–4.0)
CK, MB: 6.1 ng/mL (ref 0.3–4.0)
CK, MB: 9.5 ng/mL (ref 0.3–4.0)
Relative Index: 3 — ABNORMAL HIGH (ref 0.0–2.5)
Relative Index: 4.2 — ABNORMAL HIGH (ref 0.0–2.5)
Relative Index: 4.7 — ABNORMAL HIGH (ref 0.0–2.5)
Relative Index: INVALID (ref 0.0–2.5)
Relative Index: INVALID (ref 0.0–2.5)
Total CK: 129 U/L (ref 7–177)
Total CK: 176 U/L (ref 7–177)
Total CK: 227 U/L — ABNORMAL HIGH (ref 7–177)
Total CK: 39 U/L (ref 7–177)
Total CK: 70 U/L (ref 7–177)
Troponin I: 0.17 ng/mL — ABNORMAL HIGH (ref 0.00–0.06)
Troponin I: 0.85 ng/mL (ref 0.00–0.06)

## 2010-11-18 LAB — COMPREHENSIVE METABOLIC PANEL
ALT: 23 U/L (ref 0–35)
AST: 70 U/L — ABNORMAL HIGH (ref 0–37)
Albumin: 3.5 g/dL (ref 3.5–5.2)
Alkaline Phosphatase: 55 U/L (ref 39–117)
BUN: 19 mg/dL (ref 6–23)
CO2: 21 mEq/L (ref 19–32)
Calcium: 9.6 mg/dL (ref 8.4–10.5)
Chloride: 107 mEq/L (ref 96–112)
Creatinine, Ser: 1.29 mg/dL — ABNORMAL HIGH (ref 0.4–1.2)
GFR calc Af Amer: 49 mL/min — ABNORMAL LOW (ref >60)
GFR calc non Af Amer: 41 mL/min — ABNORMAL LOW (ref >60)
Glucose, Bld: 167 mg/dL — ABNORMAL HIGH (ref 70–99)
Potassium: 3 mEq/L — ABNORMAL LOW (ref 3.5–5.1)
Sodium: 139 mEq/L (ref 135–145)
Total Bilirubin: 0.8 mg/dL (ref 0.3–1.2)
Total Protein: 6.3 g/dL (ref 6.0–8.3)

## 2010-11-18 LAB — CULTURE, BLOOD (ROUTINE X 2)
Culture: NO GROWTH
Culture: NO GROWTH
Report Status: 10062011
Report Status: 10062011

## 2010-11-18 LAB — IRON AND TIBC
Iron: 28 ug/dL — ABNORMAL LOW (ref 42–135)
Saturation Ratios: 11 % — ABNORMAL LOW (ref 20–55)
TIBC: 250 ug/dL (ref 250–470)
UIBC: 222 ug/dL

## 2010-11-18 LAB — URINE MICROSCOPIC-ADD ON

## 2010-11-18 LAB — PROTIME-INR
INR: 1.15 (ref 0.00–1.49)
Prothrombin Time: 14.9 s (ref 11.6–15.2)

## 2010-11-18 LAB — URINALYSIS, ROUTINE W REFLEX MICROSCOPIC
Bilirubin Urine: NEGATIVE
Glucose, UA: NEGATIVE mg/dL
Leukocytes, UA: NEGATIVE
Nitrite: NEGATIVE
Protein, ur: NEGATIVE mg/dL
Specific Gravity, Urine: >1.03 — ABNORMAL HIGH (ref 1.005–1.030)
Urobilinogen, UA: 0.2 mg/dL (ref 0.0–1.0)
pH: 6 (ref 5.0–8.0)

## 2010-11-18 LAB — BRAIN NATRIURETIC PEPTIDE
Pro B Natriuretic peptide (BNP): 132 pg/mL — ABNORMAL HIGH (ref 0.0–100.0)
Pro B Natriuretic peptide (BNP): 620 pg/mL — ABNORMAL HIGH (ref 0.0–100.0)
Pro B Natriuretic peptide (BNP): 660 pg/mL — ABNORMAL HIGH (ref 0.0–100.0)
Pro B Natriuretic peptide (BNP): 942 pg/mL — ABNORMAL HIGH (ref 0.0–100.0)

## 2010-11-18 LAB — FOLATE: Folate: >20 ng/mL

## 2010-11-18 LAB — FERRITIN: Ferritin: 361 ng/mL — ABNORMAL HIGH (ref 10–291)

## 2010-11-18 LAB — VITAMIN B12: Vitamin B-12: >2000 pg/mL — ABNORMAL HIGH (ref 211–911)

## 2010-11-18 LAB — TSH: TSH: 1.553 u[IU]/mL (ref 0.350–4.500)

## 2010-11-19 LAB — URINALYSIS, ROUTINE W REFLEX MICROSCOPIC
Bilirubin Urine: NEGATIVE
Glucose, UA: NEGATIVE mg/dL
Hgb urine dipstick: NEGATIVE
Ketones, ur: NEGATIVE mg/dL
Leukocytes, UA: NEGATIVE
Nitrite: POSITIVE — AB
Protein, ur: NEGATIVE mg/dL
Specific Gravity, Urine: 1.025 (ref 1.005–1.030)
Urobilinogen, UA: 0.2 mg/dL (ref 0.0–1.0)
pH: 5.5 (ref 5.0–8.0)

## 2010-11-19 LAB — BLOOD GAS, ARTERIAL
Acid-Base Excess: 1.2 mmol/L (ref 0.0–2.0)
Bicarbonate: 22.7 mEq/L (ref 20.0–24.0)
FIO2: 100 %
MECHVT: 600 mL
O2 Saturation: 97.8 %
PEEP: 5 cmH2O
Patient temperature: 37
RATE: 14 resp/min
TCO2: 20.7 mmol/L (ref 0–100)
pCO2 arterial: 36 mmHg (ref 35.0–45.0)
pH, Arterial: 7.416 — ABNORMAL HIGH (ref 7.350–7.400)
pO2, Arterial: 225 mmHg — ABNORMAL HIGH (ref 80.0–100.0)

## 2010-11-19 LAB — HIGH SENSITIVITY CRP: CRP, High Sensitivity: 0.8 mg/L

## 2010-11-19 LAB — BASIC METABOLIC PANEL
BUN: 17 mg/dL (ref 6–23)
BUN: 8 mg/dL (ref 6–23)
BUN: 9 mg/dL (ref 6–23)
CO2: 25 mEq/L (ref 19–32)
CO2: 28 mEq/L (ref 19–32)
CO2: 29 mEq/L (ref 19–32)
Calcium: 8.7 mg/dL (ref 8.4–10.5)
Calcium: 8.9 mg/dL (ref 8.4–10.5)
Calcium: 9.2 mg/dL (ref 8.4–10.5)
Chloride: 104 mEq/L (ref 96–112)
Chloride: 104 mEq/L (ref 96–112)
Chloride: 111 mEq/L (ref 96–112)
Creatinine, Ser: 1.06 mg/dL (ref 0.4–1.2)
Creatinine, Ser: 1.09 mg/dL (ref 0.4–1.2)
Creatinine, Ser: 1.11 mg/dL (ref 0.4–1.2)
GFR calc Af Amer: 58 mL/min — ABNORMAL LOW (ref >60)
GFR calc Af Amer: 60 mL/min — ABNORMAL LOW (ref >60)
GFR calc Af Amer: >60 mL/min (ref >60)
GFR calc non Af Amer: 48 mL/min — ABNORMAL LOW (ref >60)
GFR calc non Af Amer: 49 mL/min — ABNORMAL LOW (ref >60)
GFR calc non Af Amer: 51 mL/min — ABNORMAL LOW (ref >60)
Glucose, Bld: 104 mg/dL — ABNORMAL HIGH (ref 70–99)
Glucose, Bld: 111 mg/dL — ABNORMAL HIGH (ref 70–99)
Glucose, Bld: 114 mg/dL — ABNORMAL HIGH (ref 70–99)
Potassium: 3.7 mEq/L (ref 3.5–5.1)
Potassium: 3.7 mEq/L (ref 3.5–5.1)
Potassium: 3.9 mEq/L (ref 3.5–5.1)
Sodium: 137 mEq/L (ref 135–145)
Sodium: 140 mEq/L (ref 135–145)
Sodium: 142 mEq/L (ref 135–145)

## 2010-11-19 LAB — PREPARE FRESH FROZEN PLASMA

## 2010-11-19 LAB — COMPREHENSIVE METABOLIC PANEL
ALT: 20 U/L (ref 0–35)
AST: 31 U/L (ref 0–37)
Albumin: 3.8 g/dL (ref 3.5–5.2)
Alkaline Phosphatase: 51 U/L (ref 39–117)
BUN: 26 mg/dL — ABNORMAL HIGH (ref 6–23)
CO2: 30 mEq/L (ref 19–32)
Calcium: 10.2 mg/dL (ref 8.4–10.5)
Chloride: 106 mEq/L (ref 96–112)
Creatinine, Ser: 1.66 mg/dL — ABNORMAL HIGH (ref 0.4–1.2)
GFR calc Af Amer: 37 mL/min — ABNORMAL LOW (ref >60)
GFR calc non Af Amer: 30 mL/min — ABNORMAL LOW (ref >60)
Glucose, Bld: 109 mg/dL — ABNORMAL HIGH (ref 70–99)
Potassium: 3.4 mEq/L — ABNORMAL LOW (ref 3.5–5.1)
Sodium: 143 mEq/L (ref 135–145)
Total Bilirubin: 0.7 mg/dL (ref 0.3–1.2)
Total Protein: 6.9 g/dL (ref 6.0–8.3)

## 2010-11-19 LAB — CBC
HCT: 23.9 % — ABNORMAL LOW (ref 36.0–46.0)
HCT: 24.1 % — ABNORMAL LOW (ref 36.0–46.0)
HCT: 25.5 % — ABNORMAL LOW (ref 36.0–46.0)
HCT: 25.8 % — ABNORMAL LOW (ref 36.0–46.0)
HCT: 27.2 % — ABNORMAL LOW (ref 36.0–46.0)
HCT: 34 % — ABNORMAL LOW (ref 36.0–46.0)
Hemoglobin: 7.7 g/dL — ABNORMAL LOW (ref 12.0–15.0)
Hemoglobin: 7.8 g/dL — ABNORMAL LOW (ref 12.0–15.0)
Hemoglobin: 8.3 g/dL — ABNORMAL LOW (ref 12.0–15.0)
Hemoglobin: 8.3 g/dL — ABNORMAL LOW (ref 12.0–15.0)
Hemoglobin: 8.8 g/dL — ABNORMAL LOW (ref 12.0–15.0)
Hemoglobin: 11.3 g/dL — ABNORMAL LOW (ref 12.0–15.0)
MCH: 28.2 pg (ref 26.0–34.0)
MCH: 28.6 pg (ref 26.0–34.0)
MCH: 28.8 pg (ref 26.0–34.0)
MCH: 28.8 pg (ref 26.0–34.0)
MCH: 28.8 pg (ref 26.0–34.0)
MCH: 29.3 pg (ref 26.0–34.0)
MCHC: 32.2 g/dL (ref 30.0–36.0)
MCHC: 32.2 g/dL (ref 30.0–36.0)
MCHC: 32.4 g/dL (ref 30.0–36.0)
MCHC: 32.4 g/dL (ref 30.0–36.0)
MCHC: 32.5 g/dL (ref 30.0–36.0)
MCHC: 33.2 g/dL (ref 30.0–36.0)
MCV: 87.5 fL (ref 78.0–100.0)
MCV: 88.5 fL (ref 78.0–100.0)
MCV: 88.9 fL (ref 78.0–100.0)
MCV: 88.9 fL (ref 78.0–100.0)
MCV: 89 fL (ref 78.0–100.0)
MCV: 88.3 fL (ref 78.0–100.0)
Platelets: 179 10*3/uL (ref 150–400)
Platelets: 196 10*3/uL (ref 150–400)
Platelets: 209 10*3/uL (ref 150–400)
Platelets: 224 10*3/uL (ref 150–400)
Platelets: 232 10*3/uL (ref 150–400)
Platelets: 244 10*3/uL (ref 150–400)
RBC: 2.71 MIL/uL — ABNORMAL LOW (ref 3.87–5.11)
RBC: 2.73 MIL/uL — ABNORMAL LOW (ref 3.87–5.11)
RBC: 2.88 MIL/uL — ABNORMAL LOW (ref 3.87–5.11)
RBC: 2.9 MIL/uL — ABNORMAL LOW (ref 3.87–5.11)
RBC: 3.06 MIL/uL — ABNORMAL LOW (ref 3.87–5.11)
RBC: 3.86 MIL/uL — ABNORMAL LOW (ref 3.87–5.11)
RDW: 14.1 % (ref 11.5–15.5)
RDW: 14.2 % (ref 11.5–15.5)
RDW: 14.3 % (ref 11.5–15.5)
RDW: 14.4 % (ref 11.5–15.5)
RDW: 14.8 % (ref 11.5–15.5)
RDW: 14.3 % (ref 11.5–15.5)
WBC: 10.8 10*3/uL — ABNORMAL HIGH (ref 4.0–10.5)
WBC: 12.2 10*3/uL — ABNORMAL HIGH (ref 4.0–10.5)
WBC: 15.4 10*3/uL — ABNORMAL HIGH (ref 4.0–10.5)
WBC: 7.8 10*3/uL (ref 4.0–10.5)
WBC: 8.1 10*3/uL (ref 4.0–10.5)
WBC: 5.6 10*3/uL (ref 4.0–10.5)

## 2010-11-19 LAB — URINE MICROSCOPIC-ADD ON

## 2010-11-19 LAB — ABO/RH: ABO/RH(D): O POS

## 2010-11-19 LAB — DIFFERENTIAL
Basophils Absolute: 0.1 10*3/uL (ref 0.0–0.1)
Basophils Relative: 1 % (ref 0–1)
Eosinophils Absolute: 0.2 10*3/uL (ref 0.0–0.7)
Eosinophils Relative: 4 % (ref 0–5)
Lymphocytes Relative: 43 % (ref 12–46)
Lymphs Abs: 2.4 10*3/uL (ref 0.7–4.0)
Monocytes Absolute: 0.4 10*3/uL (ref 0.1–1.0)
Monocytes Relative: 7 % (ref 3–12)
Neutro Abs: 2.5 10*3/uL (ref 1.7–7.7)
Neutrophils Relative %: 45 % (ref 43–77)

## 2010-11-19 LAB — CULTURE, RESPIRATORY W GRAM STAIN

## 2010-11-19 LAB — TRIGLYCERIDES: Triglycerides: 146 mg/dL (ref <150)

## 2010-11-19 LAB — C3 COMPLEMENT: C3 Complement: 118 mg/dL (ref 88–201)

## 2010-11-19 LAB — SEDIMENTATION RATE: Sed Rate: 15 mm/hr (ref 0–22)

## 2010-11-19 LAB — C4 COMPLEMENT: Complement C4, Body Fluid: 20 mg/dL (ref 16–47)

## 2010-11-19 LAB — C1 ESTERASE INHIBITOR: C1INH SerPl-mCnc: 16 mg/dL (ref 11–26)

## 2010-11-19 LAB — MRSA PCR SCREENING
MRSA by PCR: NEGATIVE
MRSA by PCR: NEGATIVE

## 2010-11-19 LAB — CHOLESTEROL, TOTAL: Cholesterol: 132 mg/dL (ref 0–200)

## 2010-12-09 LAB — POCT I-STAT, CHEM 8
BUN: 22 mg/dL (ref 6–23)
Calcium, Ion: 1.23 mmol/L (ref 1.12–1.32)
Chloride: 102 mEq/L (ref 96–112)
Creatinine, Ser: 1.3 mg/dL — ABNORMAL HIGH (ref 0.4–1.2)
Glucose, Bld: 89 mg/dL (ref 70–99)
HCT: 36 % (ref 36.0–46.0)
Hemoglobin: 12.2 g/dL (ref 12.0–15.0)
Potassium: 3.8 mEq/L (ref 3.5–5.1)
Sodium: 138 mEq/L (ref 135–145)
TCO2: 28 mmol/L (ref 0–100)

## 2010-12-15 ENCOUNTER — Other Ambulatory Visit: Payer: Self-pay | Admitting: Family Medicine

## 2010-12-21 ENCOUNTER — Other Ambulatory Visit: Payer: Self-pay | Admitting: Family Medicine

## 2010-12-21 DIAGNOSIS — K219 Gastro-esophageal reflux disease without esophagitis: Secondary | ICD-10-CM

## 2010-12-21 DIAGNOSIS — I10 Essential (primary) hypertension: Secondary | ICD-10-CM

## 2011-01-01 ENCOUNTER — Encounter: Payer: Self-pay | Admitting: Family Medicine

## 2011-01-05 ENCOUNTER — Other Ambulatory Visit: Payer: Self-pay | Admitting: Family Medicine

## 2011-01-05 ENCOUNTER — Encounter: Payer: Self-pay | Admitting: Family Medicine

## 2011-01-05 LAB — BASIC METABOLIC PANEL
BUN: 33 mg/dL — ABNORMAL HIGH (ref 6–23)
CO2: 27 mEq/L (ref 19–32)
Calcium: 9.5 mg/dL (ref 8.4–10.5)
Chloride: 100 mEq/L (ref 96–112)
Creat: 1.04 mg/dL (ref 0.40–1.20)
Glucose, Bld: 82 mg/dL (ref 70–99)
Potassium: 4.2 mEq/L (ref 3.5–5.3)
Sodium: 136 mEq/L (ref 135–145)

## 2011-01-06 LAB — HEMOGLOBIN A1C
Hgb A1c MFr Bld: 5.6 % (ref <5.7)
Mean Plasma Glucose: 114 mg/dL (ref <117)

## 2011-01-07 ENCOUNTER — Encounter: Payer: Self-pay | Admitting: Family Medicine

## 2011-01-07 ENCOUNTER — Ambulatory Visit (INDEPENDENT_AMBULATORY_CARE_PROVIDER_SITE_OTHER): Payer: Medicare Other | Admitting: Family Medicine

## 2011-01-07 VITALS — BP 134/82 | HR 80 | Resp 16 | Ht 60.0 in | Wt 166.1 lb

## 2011-01-07 DIAGNOSIS — E785 Hyperlipidemia, unspecified: Secondary | ICD-10-CM

## 2011-01-07 DIAGNOSIS — L259 Unspecified contact dermatitis, unspecified cause: Secondary | ICD-10-CM

## 2011-01-07 DIAGNOSIS — I10 Essential (primary) hypertension: Secondary | ICD-10-CM

## 2011-01-07 DIAGNOSIS — F329 Major depressive disorder, single episode, unspecified: Secondary | ICD-10-CM

## 2011-01-07 DIAGNOSIS — R7301 Impaired fasting glucose: Secondary | ICD-10-CM

## 2011-01-07 DIAGNOSIS — L309 Dermatitis, unspecified: Secondary | ICD-10-CM

## 2011-01-07 DIAGNOSIS — K219 Gastro-esophageal reflux disease without esophagitis: Secondary | ICD-10-CM

## 2011-01-07 DIAGNOSIS — F3289 Other specified depressive episodes: Secondary | ICD-10-CM

## 2011-01-07 MED ORDER — AMLODIPINE BESYLATE 5 MG PO TABS: 5.0000 mg | ORAL_TABLET | Freq: Every day | ORAL | Status: DC

## 2011-01-07 MED ORDER — METOPROLOL TARTRATE 25 MG PO TABS: 25.0000 mg | ORAL_TABLET | Freq: Two times a day (BID) | ORAL | Status: DC

## 2011-01-07 MED ORDER — ATORVASTATIN CALCIUM 20 MG PO TABS: 20.0000 mg | ORAL_TABLET | Freq: Every day | ORAL | Status: DC

## 2011-01-07 MED ORDER — OMEPRAZOLE 40 MG PO CPDR: 40.0000 mg | DELAYED_RELEASE_CAPSULE | Freq: Every day | ORAL | Status: DC

## 2011-01-07 NOTE — Patient Instructions (Addendum)
F/u in 4 months  It is important that you exercise regularly at least 30 minutes 5 times a week. If you develop chest pain, have severe difficulty breathing, or feel very tired, stop exercising immediately and seek medical attention    A healthy diet is rich in fruit, vegetables and whole grains. Poultry fish, nuts and beans are a healthy choice for protein rather then red meat. A low sodium diet and drinking 64 ounces of water daily is generally recommended. Oils and sweet should be limited. Carbohydrates especially for those who are diabetic or overweight, should be limited to 34-45 gram per meal. It is important to eat on a regular schedule, at least 3 times daily. Snacks should be primarily fruits, vegetables or nuts.   Your  Blood sugar is back to normal , congrats , keep it up  HBA1C, fasting cmp EGFR, lipid  Pls enjoy your flowers , and keep active , each day  Will get better

## 2011-01-07 NOTE — Progress Notes (Signed)
  Subjective:    Patient ID: [name], female    DOB: [birth year], 77 y.o.   MRN: [id number]  HPI The PT is here for follow up and re-evaluation of chronic medical conditions, medication management and review of recent lab and radiology data.  Preventive health is updated, specifically  Cancer screening,  and Immunization.   [name] reports fatigue and a feeling of being overwhelmed and somewhat depressed at times. She is still adjusting to the recent loss of her father, refuses therapy at this time, but overall appears to be doing better than was expected. She continues to have the stress of a mentally ill chid who is non compliant with treatment, however she fortunately has excellent support from her other children as well as close family   Review of Systems Denies recent fever or chills. Denies sinus pressure, nasal congestion, ear pain or sore throat. Denies chest congestion, productive cough or wheezing. Denies chest pains, palpitations, paroxysmal nocturnal dyspnea, orthopnea and leg swelling Denies abdominal pain, nausea, vomiting,diarrhea or constipation.  Denies rectal bleeding or change in bowel movement. Denies dysuria, frequency, hesitancy or incontinence. Chronic back pain with reduced mobility due to arthritic disease, wears a long line bra at all times.Denies headaches, seizure, numbness, or tingling. Reports poor sleep, anxiety and grief, which is lessenoing over time.  C/o red rash under left breast which itches, and tends to occur in the Summer months      Objective:   Physical Exam    Patient alert and oriented and in no Cardiopulmonary distress.  HEENT: No facial asymmetry, EOMI, no sinus tenderness, TM's clear, Oropharynx pink and moist.  Neck supple no adenopathy.  Chest: Clear to auscultation bilaterally.  CVS: S1, S2 no murmurs, no S3.  ABD: Soft non tender. Bowel sounds normal.  Ext: No edema  MS: decreased  ROM spine,adequate in   shoulders, hips and knees.  Skin:erythematous rash under left breast, no ulceration , pustular drainage or vesicles noted  Psych: Good eye contact, normal affect. Memory intact not anxious or depressed appearing.  CNS: CN 2-12 intact, power, tone and sensation normal throughout.     Assessment & Plan:

## 2011-01-09 DIAGNOSIS — L309 Dermatitis, unspecified: Secondary | ICD-10-CM | POA: Insufficient documentation

## 2011-01-09 MED ORDER — NYSTATIN 100000 UNIT/GM EX POWD: CUTANEOUS | Status: DC

## 2011-01-09 MED ORDER — CLOTRIMAZOLE-BETAMETHASONE 1-0.05 % EX CREA: TOPICAL_CREAM | Freq: Two times a day (BID) | CUTANEOUS | Status: DC

## 2011-01-09 NOTE — Assessment & Plan Note (Signed)
Improved, pt encouraged to seek therapy, still refusing. No indication for meds at this time, not suicidal, homicidal or hallucinating

## 2011-01-09 NOTE — Assessment & Plan Note (Signed)
Controlled, no change in medication  

## 2011-01-09 NOTE — Progress Notes (Signed)
Addended by: Syliva Overman on: 01/09/2011 11:49 AM   Modules accepted: Orders

## 2011-01-09 NOTE — Assessment & Plan Note (Signed)
Triglycerides elevated, otherwise good, low fat diet discussed and encouraged

## 2011-01-22 NOTE — Procedures (Signed)
NAME:  [name], [name]                      ACCOUNT NO.:  [id number]   MEDICAL RECORD NO.:  [id number]                   PATIENT TYPE:  REC   LOCATION:  [facility]                                  FACILITY:  [facility]   PHYSICIAN:  Erick Colace, M.D.           DATE OF BIRTH:  12-20-1933   DATE OF PROCEDURE:  DATE OF DISCHARGE:                                 OPERATIVE REPORT   [name] comes in for initial evaluation. She was sent for lumbar  epidural steroid injection, however, she states that she has been taking  aspirin and has not stopped it for the required 5-7 days prior to injection.  She has a history of low back pain dating back several years.  She has had  recent MRI of the thoracic spine showing some degenerative changes without  cord compression.  MRI of the lumbar spine showed some protrusion at L1-2,  facet changes at this level, moderate spinal stenosis both foraminal and  central at this level particularly on the left side in the foraminal area  impinging on the left L1 nerve root.  L2-3, L3-4 are status post fusion, L4-  5 status post fusion without significant stenosis and then L5-S1 moderate  bilateral facet joint degenerative changes.  Anterolisthesis L5 on S1 and  moderate bilateral foraminal narrowing, some encroachment upon the L5 nerve  root.   Her pain diagram indicates pain across the low back, however, upon further  questioning she states that her pain is mainly left sided. She stated that  she was doing relatively well with her back pain and was about 5-6 years  post fusion when she fell about a year ago and started having increasing  left sided low back pain.   Her pain is made worse by sitting and bending and walking.   CURRENT MEDICATIONS:  1.  Hydrocodone 7.5 q.i.d.  2.  Skelaxin 800 t.i.d.  3.  Aspirin 81 p.o. q.d.  4.  Nitrostat p.r.n.  5.  Endocet p.r.n.  6.  Avepro 300 q.d.  7.  Avalide 300/25, 1 p.o. q.d.  8.  Os-Cal 500 q.d.  9.   Lipitor 20 p.o. q.d.   ALLERGIES:  PENICILLIN, CODEINE, WELLBUTRIN.   REVIEW OF SYMPTOMS:  She has shortness of breath and chest pain, weakness  and numbness in the past.   PHYSICAL EXAMINATION:  No acute distress. Mood and affect appropriate.  Blood pressure 143/81, pulse 70, respirations 16, O2 sat 98% on room air.  Pain level is 10/10 in her left sided low back.   __________ testing shows pain in the SI joint area on the left side.  She  had no pain with hip external or internal rotation and has good range of  motion.  She has good knee and ankle range of motion.  She had no tenderness  to palpation along the cervical, thoracic or lumbar spine.  She has 50%  range in forward flexion, extension,  lateral rotation and bending of the  lumbar spine.  She has normal deep tendon reflexes in the bilateral lower  extremities. She has normal strength bilateral lower extremities. She is  able to toe walk and heel walk.   IMPRESSION:  History of lumbar fusion L2-3, 3-4, 4-5 with expected  degenerative changes above and below the level of the fusion with facet  arthropathy and foraminal as well as central stenosis.  In addition, there  is some anterolisthesis of L5 along S1.   Based on her examination as well as her history appears to be the left SI  joint is most likely the source of her pain and certainly we do see this  post fusion where excess force gets transmitted to the left SI joints.  I  think starting out with injection of that area would be reasonable and  should this fail then a more midline approach L5-S1 translaminar would be  the next appropriate procedure.  In any case, she will need to be off the  aspirin for 5-7 days prior to the procedure.  She agrees and have given her  some literature on spinal injections.                                                Erick Colace, M.D.    AEK/MEDQ  D:  05/21/2004 17:17:59  T:  05/22/2004 08:35:37  Job:  [id number]   cc:    Milus Mallick. Lodema Hong, M.D.  [address]  [place], [place] [address]  Fax: [phone]

## 2011-01-22 NOTE — H&P (Signed)
NAME:  [name], [name] NO.:  [id number]   MEDICAL RECORD NO.:  [id number]          PATIENT TYPE:  AMB   LOCATION:                                FACILITY:  [facility]   PHYSICIAN:  Dalia Heading, M.D.  DATE OF BIRTH:  August 31, 1934   DATE OF ADMISSION:  DATE OF DISCHARGE:  LH                                HISTORY & PHYSICAL   CHIEF COMPLAINT:  Need for screening colonoscopy.   HISTORY OF PRESENT ILLNESS:  The patient is a 77 year old black female who  was referred for endoscopic evaluation. She needs colonoscopy for screening  purposes. No abdominal pain, weight loss, nausea, vomiting, diarrhea,  constipation, melena or hematochezia been noted. She has never had a  colonoscopy. There is no family history of colon carcinoma.   PAST MEDICAL HISTORY:  Includes coronary artery disease, hypertension,  musculoskeletal disorder.   PAST SURGICAL HISTORY:  Multiple back surgeries, knee surgery, shoulder  surgery, cholecystectomy, appendectomy.   CURRENT MEDICATIONS:  Hydrocodone, Endocet, Diovan, Nitrostat, trazodone,  diltiazem, Skelaxin, Lipitor.   ALLERGIES:  PENICILLIN and CODEINE.   REVIEW OF SYSTEMS:  No recent MI, chest pain, or angina.   PHYSICAL EXAMINATION:  GENERAL:  On physical examination, the patient is a  well-developed, well-nourished black female in no acute distress.  LUNGS:  Clear to auscultation with equal breath sounds bilaterally.  HEART:  Heart examination reveals regular rate and rhythm without S3, S4 or  murmurs.  ABDOMEN:  The abdomen is soft, nontender, nondistended. No  hepatosplenomegaly or masses are noted.  RECTAL:  Rectal examination was deferred to the procedure.   IMPRESSION:  Need for screening colonoscopy.   PLAN:  The patient is scheduled for colonoscopy on December 21, 2005. Risks and  benefits of procedure including bleeding and perforation were fully  explained to the patient, who gave informed consent.      Dalia Heading,  M.D.  Electronically Signed     MAJ/MEDQ  D:  11/25/2005  T:  11/26/2005  Job:  [id number]   cc:   [facility]  Fax: [phone]   Milus Mallick. Lodema Hong, M.D.  Fax: [phone]

## 2011-01-22 NOTE — Procedures (Signed)
NAMECONITA, Tyler NO.:  [id number]   MEDICAL RECORD NO.:  [id number]          PATIENT TYPE:  REC   LOCATION:  [facility]                          FACILITY:  [facility]   PHYSICIAN:  Erick Colace, M.D.DATE OF BIRTH:  Jul 24, 1934   DATE OF PROCEDURE:  07/02/2004  DATE OF DISCHARGE:                                 OPERATIVE REPORT   MEDICAL RECORD NUMBER:  [id number].   DATE OF BIRTH:  [birth year].   PROCEDURE:  Right sacroiliac joint injection under fluoroscopic guidance.   INDICATIONS:  Back pain, relieved by left SI joint injection, now pain is  centered more over right upper buttocks region.   Informed consent was obtained after describing risks and benefits of the  procedure to the patient. These risks include bleeding, bruising or  infection; temporary or permanent paralysis, loss of bowel and bladder  function.   She elects to proceed and has given written consent.   The patient was placed prone on the fluoroscopy table, Betadine prep,  sterile drape. A 25-gauge 1-1/4-inch needle was used to anesthetize the skin  and subcu tissues with 1% lidocaine x3 cc. Then a 22-gauge 3-1/2-inch spinal  needle was injected under fluoroscopic guidance at the right SI joint. AP  and lateral views were obtained and confirmed proper needle placement.  Omnipaque 180 0.5 cc was injected, and intravascular uptake venous pattern  was seen. Needle was readjusted and was withdrawn, entered the joint at a  more inferior angle, and once again after proper needle placement was  obtained using AP and lateral images, 0.5 cc of 1% Omnipaque demonstrated no  evidence of intravascular uptake. Then a solution containing 0.75 cc of 40  mg/cc Kenalog plus 0.75 cc of 2% lidocaine were injected. The patient  tolerated the procedure well. Post injection instructions given. The patient  will return in 6 weeks. She is going to physical therapy in that time to do  SI joint stabilization  exercises. I will do bilateral SI joint injections if  needed next visit. If not, will monitor how she is responding.       AEK/MEDQ  D:  07/02/2004 12:56:45  T:  07/03/2004 10:55:52  Job:  [id number]

## 2011-01-22 NOTE — Procedures (Signed)
[name], DESMITH NO.:  [id number]   MEDICAL RECORD NO.:  [id number]          PATIENT TYPE:  REC   LOCATION:  [facility]                          FACILITY:  [facility]   PHYSICIAN:  Erick Colace, M.D.DATE OF BIRTH:  Jan 11, 1934   DATE OF PROCEDURE:  07/02/2004  DATE OF DISCHARGE:                                 OPERATIVE REPORT   DATE OF BIRTH:  1934/07/03.   MEDICAL RECORD NUMBER:  [id number].   PROCEDURE:  This is a right sacroiliac joint injection under fluoroscopic  guidance.   INDICATIONS:  Low back pain, buttock area, prior injection for primary left  sided buttock pain relieved pain. This was done on June 11, 2004.   Informed consent was obtained after describing risks and benefits of the  procedure to the patient. The patient elects to proceed.   The patient placed prone on fluoroscopy table, Betadine prep, sterile drape.  An 1-1/4-inch 25-gauge needle was used to anesthetize skin and subcu tissue  with 2 cc of 1% lidocaine. Then a 22-gauge 3-1/2-inch spinal needle was  inserted under fluoroscopic guidance to the right SI joint. The needle was  placed and was confirmed under AP as well as lateral imaging.   Dictator requests to cancel dictation.       AEK/MEDQ  D:  07/02/2004 16:58:25  T:  07/03/2004 13:24:49  Job:  [id number]

## 2011-01-22 NOTE — Procedures (Signed)
[name], [name] NO.:  [id number]   MEDICAL RECORD NO.:  [id number]          PATIENT TYPE:  REC   LOCATION:  [facility]                          FACILITY:  [facility]   PHYSICIAN:  Erick Colace, M.D.DATE OF BIRTH:  1933-09-13   DATE OF PROCEDURE:  06/11/2004  DATE OF DISCHARGE:                                 OPERATIVE REPORT   MEDICAL RECORD NUMBER:  [id number].   PROCEDURE:  This is a left sacroiliac joint injection under fluoroscopic  guidance.   INDICATIONS:  Left buttock and low back pain. History of L2-3, 3-4, 4-5  fusion.   Informed consent was obtained after describing the risks and benefits of the  procedure with the patient. She elects to proceed. The patient was placed  prone on the fluoroscopy stable, Betadine prep, sterile drape. A 25-gauge 1-  1/4-inch needle was used to anesthetize the skin and subcu tissue with 2 cc  of 1% lidocaine. Then a 22-gauge, 3-1/2-inch spinal needle was inserted  under fluoroscopic guidance into the left SI joint. Needle placement was  confirmed  under AP as well as lateral imaging, and contrast enhanced  injection live fluoro Omnipaque 180 x0.5 cc showed no evidence of  intravascular uptake and good intra-articular spread. Then a solution  containing 0.5 cc of 40 mg/cc of Kenalog and 0.5 cc of 2% methylparaben-free  lidocaine were injected. This was followed by flush with another 0.5 cc of  1% lidocaine as needle was being withdrawn. The patient tolerated the  procedure well. Post injection pain level reduced from 10 to 9.   She is to follow up 3 weeks for another injection.       AEK/MEDQ  D:  06/11/2004 16:07:35  T:  06/12/2004 13:23:25  Job:  [id number]

## 2011-01-22 NOTE — Procedures (Signed)
[name], WEIMANN NO.:  [id number]   MEDICAL RECORD NO.:  [id number]          PATIENT TYPE:  REC   LOCATION:  [facility]                          FACILITY:  [facility]   PHYSICIAN:  Erick Colace, M.D.DATE OF BIRTH:  12/03/1933   DATE OF PROCEDURE:  08/17/2004  DATE OF DISCHARGE:                                 OPERATIVE REPORT   PROCEDURE:  Right sacroiliac joint injection under fluoroscopic guidance.   INDICATIONS:  Right sacroiliac pain, history of L3-1 fusion.   Informed consent was obtained after describing risks and benefits of the  procedure to the patient. The patient elects to proceed. She has had  previous good relief from the left sacroiliac joint injection for the left  sided buttock pain.   The patient placed prone on fluoroscopy table, Betadine prep, sterile drape.  One percent lid injected to skin and subcu tissue x2 cc. Needle inserted  under fluoroscopic guidance. Lateral imaging showed that needle was just on  the surface of the joint. Therefore, the 22-gauge, 3-1/2-inch needle was  removed, and a new skin wheal was raised with lidocaine x2.5 cc,  approximately 1.5 cm inferior to original site. Then a 22-gauge, 3-1/2-inch  spinal needle was injected without difficulty into the sacroiliac joint, and  lateral images confirmed joint placement, and Omnipaque 180 x0.5 cc  demonstrated no intravascular uptake. Then a solution containing 40 mg/cc  Kenalog x0.75 cc and 2% lidocaine x0.75% were injected. The patient  tolerated the procedure well. Post injection instructions given.      Andr   AEK/MEDQ  D:  08/17/2004 13:28:59  T:  08/17/2004 13:47:25  Job:  [id number]

## 2011-01-22 NOTE — H&P (Signed)
[name], TINNER NO.:  [id number]   MEDICAL RECORD NO.:  [id number]                  PATIENT TYPE:   LOCATION:                                       FACILITY:   PHYSICIAN:  Dalia Heading, M.D.               DATE OF BIRTH:  May 02, 1934   DATE OF ADMISSION:  DATE OF DISCHARGE:                                HISTORY & PHYSICAL   CHIEF COMPLAINT:  Chronic cholecystitis.   HISTORY OF PRESENT ILLNESS:  The patient is a 77 year old black female who  is referred for evaluation and treatment of biliary colic secondary to  chronic cholecystitis.  She has been having intermittent episodes of right  flank pain, nausea, and bloating for several months.  She indigestion is  noted.  Minimal fatty food intolerance is noted.  No fever, chills, or  jaundice had been noted.   PAST MEDICAL HISTORY:  1. Hypertension.  2. Musculoskeletal disorder.   PAST SURGICAL HISTORY:  Two back surgeries, two hand surgeries, knee  surgery, shoulder surgery, appendectomy.   CURRENT MEDICATIONS:  1. Hydrocodone.  2. Endocet.  3. Diovan.  4. Nitrostat.  5. Trazodone.  6. Diltiazem.  7. Skelaxin.  8. Lipitor.   ALLERGIES:  PENICILLIN AND CODEINE.   REVIEW OF SYSTEMS:  The patient is followed by Dr. Domingo Sep of Cardiology.  She recently had a negative stress test and an echocardiogram for workup of  heart palpitations.  Holter monitor testing done recently was negative.   PHYSICAL EXAMINATION:  GENERAL:  The patient is a well-developed, well-  nourished black female, in no acute distress.  VITAL SIGNS:  She is afebrile and vital signs are stable.  HEENT:  Examination reveals no scleral icterus.  LUNGS:  Clear to auscultation with equal breath sounds bilaterally.  HEART:  Examination reveals a regular rate and rhythm without S3, S4 or  murmurs.  ABDOMEN:  With slight tenderness now in the right upper quadrant to  palpation.  No hepatosplenomegaly, masses or hernias  are identified.   STUDIES:  An ultrasound of the gallbladder is negative.  Hepatobiliary scan  reveals a low gallbladder ejection fraction with reproducible pain with CCK  injection.   IMPRESSION:  Chronic cholecystitis.   PLAN:  The patient was scheduled for laparoscopic cholecystectomy on September 27, 2003.  The risks and benefits of the procedure including bleeding,  infection, hepatobiliary injury and the possibility of an open procedure  were fully explained to the patient who gave informed consent.     ___________________________________________                                         Dalia Heading, M.D.   MAJ/MEDQ  D:  09/23/2003  T:  09/23/2003  Job:  [id number]   cc:   Milus Mallick. Lodema Hong, M.D.  [address]  [place], [place] [address]  Fax: [phone]   Dani Gobble, MD  Fax: [phone]

## 2011-01-22 NOTE — Op Note (Signed)
NAME:  [name], [name]                      ACCOUNT NO.:  [id number]   MEDICAL RECORD NO.:  [id number]                   PATIENT TYPE:  AMB   LOCATION:  DAY                                  FACILITY:  [facility]   PHYSICIAN:  Dalia Heading, M.D.               DATE OF BIRTH:  1933-10-15   DATE OF PROCEDURE:  09/27/2003  DATE OF DISCHARGE:                                 OPERATIVE REPORT   PREOPERATIVE DIAGNOSIS:  Chronic cholecystitis.   POSTOPERATIVE DIAGNOSIS:  Chronic cholecystitis.   PROCEDURE:  Laparoscopic cholecystectomy.   SURGEON:  Dalia Heading, M.D.   ASSISTANT:  Bernerd Limbo. Leona Carry, M.D.   ANESTHESIA:  General endotracheal.   INDICATIONS FOR PROCEDURE:  The patient is 77 year old black female who  presents with biliary colic secondary to chronic cholecystitis.  The risks  and benefits of the procedure, including bleeding, infection, hepatobiliary  injury, and the possibility of an open procedure were fully explained to the  patient who gave informed consent.   DESCRIPTION OF PROCEDURE:  The patient was placed in the supine position.  After induction of general endotracheal anesthesia, the abdomen was prepped  and draped using the usual sterile technique with Betadine.  Surgical site  confirmation was performed.   A supraumbilical incision was made down to the fascia.  A Veress needle was  introduced into the abdominal cavity, and confirmation of placement was done  using the saline drop test.  The abdomen was then insufflated to 16 mmHg  pressure.  An 11-mm trocar was introduced into the abdominal cavity under  direct visualization without difficulty.  The patient was placed in reverse  Trendelenburg position, and an additional 11-mm trocar was placed in the  epigastric region and 5-mm trocars were placed in the right upper quadrant  and right flank regions.  The liver was inspected and noted to be within  normal limits.  The gallbladder was retracted superiorly  and laterally.  The  dissection was begun around the infundibulum of the gallbladder.  The cystic  duct was first identified.  Its juncture to the infundibulum was fully  identified.  Endoclips were placed proximally and distally on the cystic  duct, and the cystic duct was divided.  This was likewise done on the cystic  artery.  The gallbladder was then freed away from the gallbladder fossa  using Bovie electrocautery.  The gallbladder was delivered through the  epigastric trocar site using an EndoCatch bag.  No abnormal bleeding or bile  leakage was noted.  Surgicel was placed in the gallbladder fossa.  All fluid  and air were then evacuated from the abdominal cavity prior to removal of  the trocars.   All wounds were irrigated with normal saline.  All wounds were injected with  0.5% Sensorcaine.  The supraumbilical fascia was reapproximated using an 0  Vicryl interrupted suture.  All skin incisions were closed using staples.  Betadine ointment and  dry sterile dressings were applied.   All tape and needle counts were correct at the end of the procedure.  The  patient was extubated in the operating room and went back to the recovery  room awake and in stable condition.   COMPLICATIONS:  None.   SPECIMENS:  Gallbladder.   ESTIMATED BLOOD LOSS:  Minimal.      ___________________________________________                                            Dalia Heading, M.D.   MAJ/MEDQ  D:  09/27/2003  T:  09/27/2003  Job:  [id number]   cc:   Milus Mallick. Lodema Hong, M.D.  [address]  [place], [place] [address]  Fax: [phone]

## 2011-02-18 ENCOUNTER — Other Ambulatory Visit: Payer: Self-pay | Admitting: Family Medicine

## 2011-03-23 ENCOUNTER — Other Ambulatory Visit: Payer: Self-pay | Admitting: Family Medicine

## 2011-04-12 ENCOUNTER — Telehealth: Payer: Self-pay | Admitting: Family Medicine

## 2011-04-12 ENCOUNTER — Other Ambulatory Visit: Payer: Self-pay | Admitting: Family Medicine

## 2011-04-12 DIAGNOSIS — M549 Dorsalgia, unspecified: Secondary | ICD-10-CM

## 2011-04-12 NOTE — Telephone Encounter (Signed)
pls refer to pain center she goes to for epidural, she has been there [facility], order entered

## 2011-04-15 ENCOUNTER — Other Ambulatory Visit: Payer: Self-pay | Admitting: Family Medicine

## 2011-04-15 DIAGNOSIS — M545 Low back pain, unspecified: Secondary | ICD-10-CM

## 2011-04-16 ENCOUNTER — Other Ambulatory Visit: Payer: Medicare Other

## 2011-04-27 ENCOUNTER — Ambulatory Visit
Admission: RE | Admit: 2011-04-27 | Discharge: 2011-04-27 | Disposition: A | Discharge status: Home / Self Care | Payer: Medicare Other | Source: Ambulatory Visit | Attending: Family Medicine | Admitting: Family Medicine

## 2011-04-27 ENCOUNTER — Other Ambulatory Visit: Payer: Self-pay | Admitting: Family Medicine

## 2011-04-27 DIAGNOSIS — M545 Low back pain, unspecified: Secondary | ICD-10-CM

## 2011-04-27 MED ORDER — IOHEXOL 180 MG/ML  SOLN
1.0000 mL | Freq: Once | INTRAMUSCULAR | Status: AC | PRN
  Administered 2011-04-27: 1 mL via EPIDURAL

## 2011-04-27 MED ORDER — METHYLPREDNISOLONE ACETATE 40 MG/ML INJ SUSP (RADIOLOG
120.0000 mg | Freq: Once | INTRAMUSCULAR | Status: AC
  Administered 2011-04-27: 120 mg via EPIDURAL

## 2011-05-25 ENCOUNTER — Encounter: Payer: Self-pay | Admitting: Family Medicine

## 2011-05-26 ENCOUNTER — Ambulatory Visit (INDEPENDENT_AMBULATORY_CARE_PROVIDER_SITE_OTHER): Payer: Medicare Other | Admitting: Family Medicine

## 2011-05-26 ENCOUNTER — Encounter: Payer: Self-pay | Admitting: Family Medicine

## 2011-05-26 VITALS — BP 140/80 | HR 68 | Resp 16 | Ht 60.0 in | Wt 167.1 lb

## 2011-05-26 DIAGNOSIS — IMO0002 Reserved for concepts with insufficient information to code with codable children: Secondary | ICD-10-CM

## 2011-05-26 DIAGNOSIS — R5381 Other malaise: Secondary | ICD-10-CM

## 2011-05-26 DIAGNOSIS — R5383 Other fatigue: Secondary | ICD-10-CM

## 2011-05-26 DIAGNOSIS — R7301 Impaired fasting glucose: Secondary | ICD-10-CM

## 2011-05-26 DIAGNOSIS — M549 Dorsalgia, unspecified: Secondary | ICD-10-CM

## 2011-05-26 DIAGNOSIS — I10 Essential (primary) hypertension: Secondary | ICD-10-CM

## 2011-05-26 DIAGNOSIS — M6281 Muscle weakness (generalized): Secondary | ICD-10-CM

## 2011-05-26 DIAGNOSIS — Z23 Encounter for immunization: Secondary | ICD-10-CM

## 2011-05-26 DIAGNOSIS — D509 Iron deficiency anemia, unspecified: Secondary | ICD-10-CM

## 2011-05-26 DIAGNOSIS — R29898 Other symptoms and signs involving the musculoskeletal system: Secondary | ICD-10-CM

## 2011-05-26 DIAGNOSIS — E785 Hyperlipidemia, unspecified: Secondary | ICD-10-CM

## 2011-05-26 LAB — COMPLETE METABOLIC PANEL WITH GFR
ALT: 13 U/L (ref 0–35)
AST: 22 U/L (ref 0–37)
Albumin: 4 g/dL (ref 3.5–5.2)
Alkaline Phosphatase: 76 U/L (ref 39–117)
BUN: 17 mg/dL (ref 6–23)
CO2: 28 mEq/L (ref 19–32)
Calcium: 9.5 mg/dL (ref 8.4–10.5)
Chloride: 100 mEq/L (ref 96–112)
Creat: 0.93 mg/dL (ref 0.50–1.10)
GFR, Est African American: >60 mL/min (ref >60)
GFR, Est Non African American: 59 mL/min — ABNORMAL LOW (ref >60)
Glucose, Bld: 81 mg/dL (ref 70–99)
Potassium: 4.1 mEq/L (ref 3.5–5.3)
Sodium: 138 mEq/L (ref 135–145)
Total Bilirubin: 0.4 mg/dL (ref 0.3–1.2)
Total Protein: 6.4 g/dL (ref 6.0–8.3)

## 2011-05-26 LAB — HEMOGLOBIN A1C
Hgb A1c MFr Bld: 5.8 % — ABNORMAL HIGH (ref <5.7)
Mean Plasma Glucose: 120 mg/dL — ABNORMAL HIGH (ref <117)

## 2011-05-26 LAB — LIPID PANEL
Cholesterol: 145 mg/dL (ref 0–200)
HDL: 51 mg/dL (ref >39)
LDL Cholesterol: 76 mg/dL (ref 0–99)
Total CHOL/HDL Ratio: 2.8 Ratio
Triglycerides: 92 mg/dL (ref <150)
VLDL: 18 mg/dL (ref 0–40)

## 2011-05-26 MED ORDER — INFLUENZA VAC TYPES A & B PF IM SUSP: 0.5000 mL | Freq: Once | INTRAMUSCULAR | Status: DC

## 2011-05-26 NOTE — Patient Instructions (Addendum)
F/u end  January.  You are being referred to Physical therapy twice weekly for right leg weakness  Your blood pressure  Is good, no med change, also your cholesterol.   Flu vaccine today.  LABWORK  NEEDS TO BE DONE BETWEEN 3 TO 7 DAYS BEFORE YOUR NEXT SCEDULED  VISIT.  THIS WILL IMPROVE THE QUALITY OF YOUR CARE.

## 2011-05-29 ENCOUNTER — Other Ambulatory Visit: Payer: Self-pay | Admitting: Family Medicine

## 2011-06-06 DIAGNOSIS — R29898 Other symptoms and signs involving the musculoskeletal system: Secondary | ICD-10-CM | POA: Insufficient documentation

## 2011-06-06 NOTE — Assessment & Plan Note (Signed)
Reports increased unilateral lower extremity weakness has significant arthritis in back, will refer for physical therapy

## 2011-06-06 NOTE — Progress Notes (Signed)
  Subjective:    Patient ID: [name], female    DOB: Oct 16, 1933, 77 y.o.   MRN: [id number]  HPI The PT is here for follow up and re-evaluation of chronic medical conditions, medication management and review of any available recent lab and radiology data.  Preventive health is updated, specifically  Cancer screening and Immunization.   Questions or concerns regarding consultations or procedures which the PT has had in the interim are  addressed. The PT denies any adverse reactions to current medications since the last visit.  C/o right lower extremity weakness, increasing difficulty raising leg when getting in and out of her truck         Review of Systems See HPI Denies recent fever or chills. Denies sinus pressure, nasal congestion, ear pain or sore throat. Denies chest congestion, productive cough or wheezing. Denies chest pains, palpitations and leg swelling Denies abdominal pain, nausea, vomiting,diarrhea or constipation.   Denies dysuria, frequency, hesitancy or incontinence.  Denies headaches, seizures, numbness, or tingling. Denies depression, anxiety or insomnia.Pt has recovered fully from the recent loss of her father and is happy in a new relationship with an old friend, contemplating marriage  Denies skin break down or rash.        Objective:   Physical Exam  Patient alert and oriented and in no cardiopulmonary distress.  HEENT: No facial asymmetry, EOMI, no sinus tenderness,  oropharynx pink and moist.  Neck supple no adenopathy.  Chest: Clear to auscultation bilaterally.  CVS: S1, S2 no murmurs, no S3.  ABD: Soft non tender. Bowel sounds normal.  Ext: No edema  WU:[id number]  ROM spine,adequate in  shoulders, hips and knees.  Skin: Intact, no ulcerations or rash noted.  Psych: Good eye contact, normal affect. Memory intact not anxious or depressed appearing.  CNS: CN 2-12 intact, power, tone and sensation normal throughout.         Assessment & Plan:

## 2011-06-06 NOTE — Assessment & Plan Note (Signed)
Controlled, no change in medication  

## 2011-06-06 NOTE — Assessment & Plan Note (Signed)
Fair control, though sub optimal no med change, DASH diet and regular physical activity discussed and encouraged

## 2011-06-06 NOTE — Assessment & Plan Note (Signed)
Improved as far as pain , but new report of lower ext weakness , will refer for PT

## 2011-06-17 ENCOUNTER — Other Ambulatory Visit: Payer: Self-pay | Admitting: Family Medicine

## 2011-07-22 ENCOUNTER — Other Ambulatory Visit: Payer: Self-pay | Admitting: Family Medicine

## 2011-08-19 ENCOUNTER — Other Ambulatory Visit: Payer: Self-pay | Admitting: Family Medicine

## 2011-09-10 ENCOUNTER — Other Ambulatory Visit: Payer: Self-pay | Admitting: Family Medicine

## 2011-09-10 DIAGNOSIS — Z139 Encounter for screening, unspecified: Secondary | ICD-10-CM

## 2011-09-17 ENCOUNTER — Telehealth: Payer: Self-pay | Admitting: Family Medicine

## 2011-09-17 ENCOUNTER — Other Ambulatory Visit: Payer: Self-pay | Admitting: Family Medicine

## 2011-09-17 MED ORDER — ACYCLOVIR 5 % EX OINT: TOPICAL_OINTMENT | CUTANEOUS | Status: DC

## 2011-09-17 NOTE — Telephone Encounter (Signed)
Patient aware.

## 2011-09-17 NOTE — Telephone Encounter (Signed)
pls let pt know med has been sent [facility]

## 2011-09-21 ENCOUNTER — Other Ambulatory Visit: Payer: Self-pay | Admitting: Family Medicine

## 2011-09-29 ENCOUNTER — Other Ambulatory Visit: Payer: Self-pay | Admitting: Family Medicine

## 2011-09-30 LAB — TSH: TSH: 6.866 u[IU]/mL — ABNORMAL HIGH (ref 0.350–4.500)

## 2011-09-30 LAB — HEPATIC FUNCTION PANEL
ALT: 15 U/L (ref 0–35)
AST: 26 U/L (ref 0–37)
Albumin: 4.5 g/dL (ref 3.5–5.2)
Alkaline Phosphatase: 86 U/L (ref 39–117)
Bilirubin, Direct: 0.1 mg/dL (ref 0.0–0.3)
Indirect Bilirubin: 0.2 mg/dL (ref 0.0–0.9)
Total Bilirubin: 0.3 mg/dL (ref 0.3–1.2)
Total Protein: 6.9 g/dL (ref 6.0–8.3)

## 2011-09-30 LAB — CBC WITH DIFFERENTIAL/PLATELET
Basophils Absolute: 0 10*3/uL (ref 0.0–0.1)
Basophils Relative: 1 % (ref 0–1)
Eosinophils Absolute: 0.2 10*3/uL (ref 0.0–0.7)
Eosinophils Relative: 4 % (ref 0–5)
HCT: 40.2 % (ref 36.0–46.0)
Hemoglobin: 12.5 g/dL (ref 12.0–15.0)
Lymphocytes Relative: 51 % — ABNORMAL HIGH (ref 12–46)
Lymphs Abs: 2.4 10*3/uL (ref 0.7–4.0)
MCH: 28.5 pg (ref 26.0–34.0)
MCHC: 31.1 g/dL (ref 30.0–36.0)
MCV: 91.8 fL (ref 78.0–100.0)
Monocytes Absolute: 0.4 10*3/uL (ref 0.1–1.0)
Monocytes Relative: 9 % (ref 3–12)
Neutro Abs: 1.6 10*3/uL — ABNORMAL LOW (ref 1.7–7.7)
Neutrophils Relative %: 35 % — ABNORMAL LOW (ref 43–77)
Platelets: 283 10*3/uL (ref 150–400)
RBC: 4.38 MIL/uL (ref 3.87–5.11)
RDW: 13.5 % (ref 11.5–15.5)
WBC: 4.6 10*3/uL (ref 4.0–10.5)

## 2011-09-30 LAB — LIPID PANEL
Cholesterol: 137 mg/dL (ref 0–200)
HDL: 50 mg/dL (ref >39)
LDL Cholesterol: 70 mg/dL (ref 0–99)
Total CHOL/HDL Ratio: 2.7 Ratio
Triglycerides: 87 mg/dL (ref <150)
VLDL: 17 mg/dL (ref 0–40)

## 2011-09-30 LAB — BASIC METABOLIC PANEL
BUN: 14 mg/dL (ref 6–23)
CO2: 29 mEq/L (ref 19–32)
Calcium: 10 mg/dL (ref 8.4–10.5)
Chloride: 101 mEq/L (ref 96–112)
Creat: 1.07 mg/dL (ref 0.50–1.10)
Glucose, Bld: 78 mg/dL (ref 70–99)
Potassium: 4.4 mEq/L (ref 3.5–5.3)
Sodium: 139 mEq/L (ref 135–145)

## 2011-09-30 LAB — HEMOGLOBIN A1C
Hgb A1c MFr Bld: 6.1 % — ABNORMAL HIGH (ref <5.7)
Mean Plasma Glucose: 128 mg/dL — ABNORMAL HIGH (ref <117)

## 2011-10-01 LAB — T4: T4, Total: 8.2 ug/dL (ref 5.0–12.5)

## 2011-10-01 LAB — T3, FREE: T3, Free: 3 pg/mL (ref 2.3–4.2)

## 2011-10-06 ENCOUNTER — Ambulatory Visit: Payer: Medicare Other | Admitting: Family Medicine

## 2011-10-06 ENCOUNTER — Encounter: Payer: Self-pay | Admitting: Family Medicine

## 2011-10-06 ENCOUNTER — Ambulatory Visit (INDEPENDENT_AMBULATORY_CARE_PROVIDER_SITE_OTHER): Payer: Medicare Other | Admitting: Family Medicine

## 2011-10-06 VITALS — BP 130/76 | HR 68 | Resp 18 | Ht 60.0 in | Wt 169.0 lb

## 2011-10-06 DIAGNOSIS — I10 Essential (primary) hypertension: Secondary | ICD-10-CM

## 2011-10-06 DIAGNOSIS — S99929A Unspecified injury of unspecified foot, initial encounter: Secondary | ICD-10-CM

## 2011-10-06 DIAGNOSIS — R7303 Prediabetes: Secondary | ICD-10-CM

## 2011-10-06 DIAGNOSIS — M79675 Pain in left toe(s): Secondary | ICD-10-CM

## 2011-10-06 DIAGNOSIS — M549 Dorsalgia, unspecified: Secondary | ICD-10-CM

## 2011-10-06 DIAGNOSIS — R946 Abnormal results of thyroid function studies: Secondary | ICD-10-CM

## 2011-10-06 DIAGNOSIS — E785 Hyperlipidemia, unspecified: Secondary | ICD-10-CM

## 2011-10-06 DIAGNOSIS — Z13820 Encounter for screening for osteoporosis: Secondary | ICD-10-CM

## 2011-10-06 DIAGNOSIS — M79609 Pain in unspecified limb: Secondary | ICD-10-CM

## 2011-10-06 DIAGNOSIS — R7309 Other abnormal glucose: Secondary | ICD-10-CM

## 2011-10-06 NOTE — Patient Instructions (Addendum)
F/u in 4.5 month.  Call if you need me before  You will be referred for a bone density scan and to orthopedics re the injury to your left 5th toe.  Please stop sweet tea, and cut back on carbs and sweets, you do not want to become diabetic.  HBA1C in 4.5 month  Weight loss goal of 8 to 10 pounds till your return  All the best!

## 2011-10-07 ENCOUNTER — Ambulatory Visit (HOSPITAL_COMMUNITY)
Admission: RE | Admit: 2011-10-07 | Discharge: 2011-10-07 | Disposition: A | Discharge status: Home / Self Care | Payer: Medicare Other | Source: Ambulatory Visit | Attending: Family Medicine | Admitting: Family Medicine

## 2011-10-07 DIAGNOSIS — Z1231 Encounter for screening mammogram for malignant neoplasm of breast: Secondary | ICD-10-CM | POA: Insufficient documentation

## 2011-10-07 DIAGNOSIS — Z139 Encounter for screening, unspecified: Secondary | ICD-10-CM

## 2011-10-10 DIAGNOSIS — R7303 Prediabetes: Secondary | ICD-10-CM | POA: Insufficient documentation

## 2011-10-10 DIAGNOSIS — S99929A Unspecified injury of unspecified foot, initial encounter: Secondary | ICD-10-CM | POA: Insufficient documentation

## 2011-10-10 DIAGNOSIS — R946 Abnormal results of thyroid function studies: Secondary | ICD-10-CM | POA: Insufficient documentation

## 2011-10-10 NOTE — Assessment & Plan Note (Signed)
Deterioration in HBa1C, needs to reduce carbs and sweets and lose weight

## 2011-10-10 NOTE — Progress Notes (Signed)
  Subjective:    Patient ID: [name], female    DOB: April 28, 1934, 78 y.o.   MRN: [id number]  HPI The PT is here for follow up and re-evaluation of chronic medical conditions, medication management and review of any available recent lab and radiology data.  Preventive health is updated, specifically  Cancer screening and Immunization.   Questions or concerns regarding consultations or procedures which the PT has had in the interim are  addressed. The PT denies any adverse reactions to current medications since the last visit.  Pt reports that while dancing on New Year's eve night , she broke her 5th  Toe, was seen in urgent care, still has the foot in a brace and c/o pain.    Review of Systems See HPI Denies recent fever or chills. Denies sinus pressure, nasal congestion, ear pain or sore throat. Denies chest congestion, productive cough or wheezing. Denies chest pains, palpitations and leg swelling Denies abdominal pain, nausea, vomiting,diarrhea or constipation.   Denies dysuria, frequency, hesitancy or incontinence. Denies headaches, seizures, numbness, or tingling. Denies depression, anxiety or insomnia. Denies skin break down or rash.        Objective:   Physical Exam  Patient alert and oriented and in no cardiopulmonary distress.  HEENT: No facial asymmetry, EOMI, no sinus tenderness,  oropharynx pink and moist.  Neck supple no adenopathy.  Chest: Clear to auscultation bilaterally.  CVS: S1, S2 no murmurs, no S3.  ABD: Soft non tender. Bowel sounds normal.  Ext: No edema  WU:[id number]   ROM spine,adequate in  shoulders, hips and knees.Left 5th toe tender and painful  Skin: Intact, no ulcerations or rash noted.  Psych: Good eye contact, normal affect. Memory intact not anxious or depressed appearing.  CNS: CN 2-12 intact, power, tone and sensation normal throughout.       Assessment & Plan:

## 2011-10-10 NOTE — Assessment & Plan Note (Signed)
Recent trauma to 5th toe, evaluated at urgent care and using a walking boot, will arrange orthof/u , she has significant joint complaints

## 2011-10-10 NOTE — Assessment & Plan Note (Signed)
tsh mildly elevated, hormone levels nl, will order [place]

## 2011-10-10 NOTE — Assessment & Plan Note (Signed)
Currently controlled.

## 2011-10-10 NOTE — Assessment & Plan Note (Signed)
Controlled, no change in medication  

## 2011-10-10 NOTE — Assessment & Plan Note (Signed)
Low fat diet encouraged, controlled no med change

## 2011-10-12 ENCOUNTER — Other Ambulatory Visit: Payer: Self-pay | Admitting: Family Medicine

## 2011-10-12 ENCOUNTER — Ambulatory Visit (HOSPITAL_COMMUNITY)
Admission: RE | Admit: 2011-10-12 | Discharge: 2011-10-12 | Disposition: A | Discharge status: Home / Self Care | Payer: Medicare Other | Source: Ambulatory Visit | Attending: Family Medicine | Admitting: Family Medicine

## 2011-10-12 ENCOUNTER — Other Ambulatory Visit: Payer: Self-pay | Admitting: Orthopedic Surgery

## 2011-10-12 DIAGNOSIS — E049 Nontoxic goiter, unspecified: Secondary | ICD-10-CM | POA: Insufficient documentation

## 2011-10-12 DIAGNOSIS — E042 Nontoxic multinodular goiter: Secondary | ICD-10-CM

## 2011-10-12 DIAGNOSIS — R946 Abnormal results of thyroid function studies: Secondary | ICD-10-CM | POA: Insufficient documentation

## 2011-10-13 ENCOUNTER — Telehealth: Payer: Self-pay | Admitting: Family Medicine

## 2011-10-13 NOTE — Progress Notes (Signed)
Pt has appt with dr. Suszanne Conners for 10/14/2011 2:45.  Pt is aware

## 2011-10-13 NOTE — Telephone Encounter (Signed)
Pt was called and is aware of appt with dr. Suszanne Conners for 10/14/2011 2:45

## 2011-10-14 ENCOUNTER — Ambulatory Visit (INDEPENDENT_AMBULATORY_CARE_PROVIDER_SITE_OTHER): Payer: Medicare Other | Admitting: Otolaryngology

## 2011-10-14 DIAGNOSIS — D449 Neoplasm of uncertain behavior of unspecified endocrine gland: Secondary | ICD-10-CM

## 2011-10-14 IMAGING — CR DG CHEST 1V PORT SAME DAY
1 series · 1 of 1 positions shown · non-contrast
Comparison: 06/05/2010

CLINICAL DATA: Endotracheal tube repositioned.

PORTABLE CHEST - 1 VIEW SAME DAY

[view not recorded]
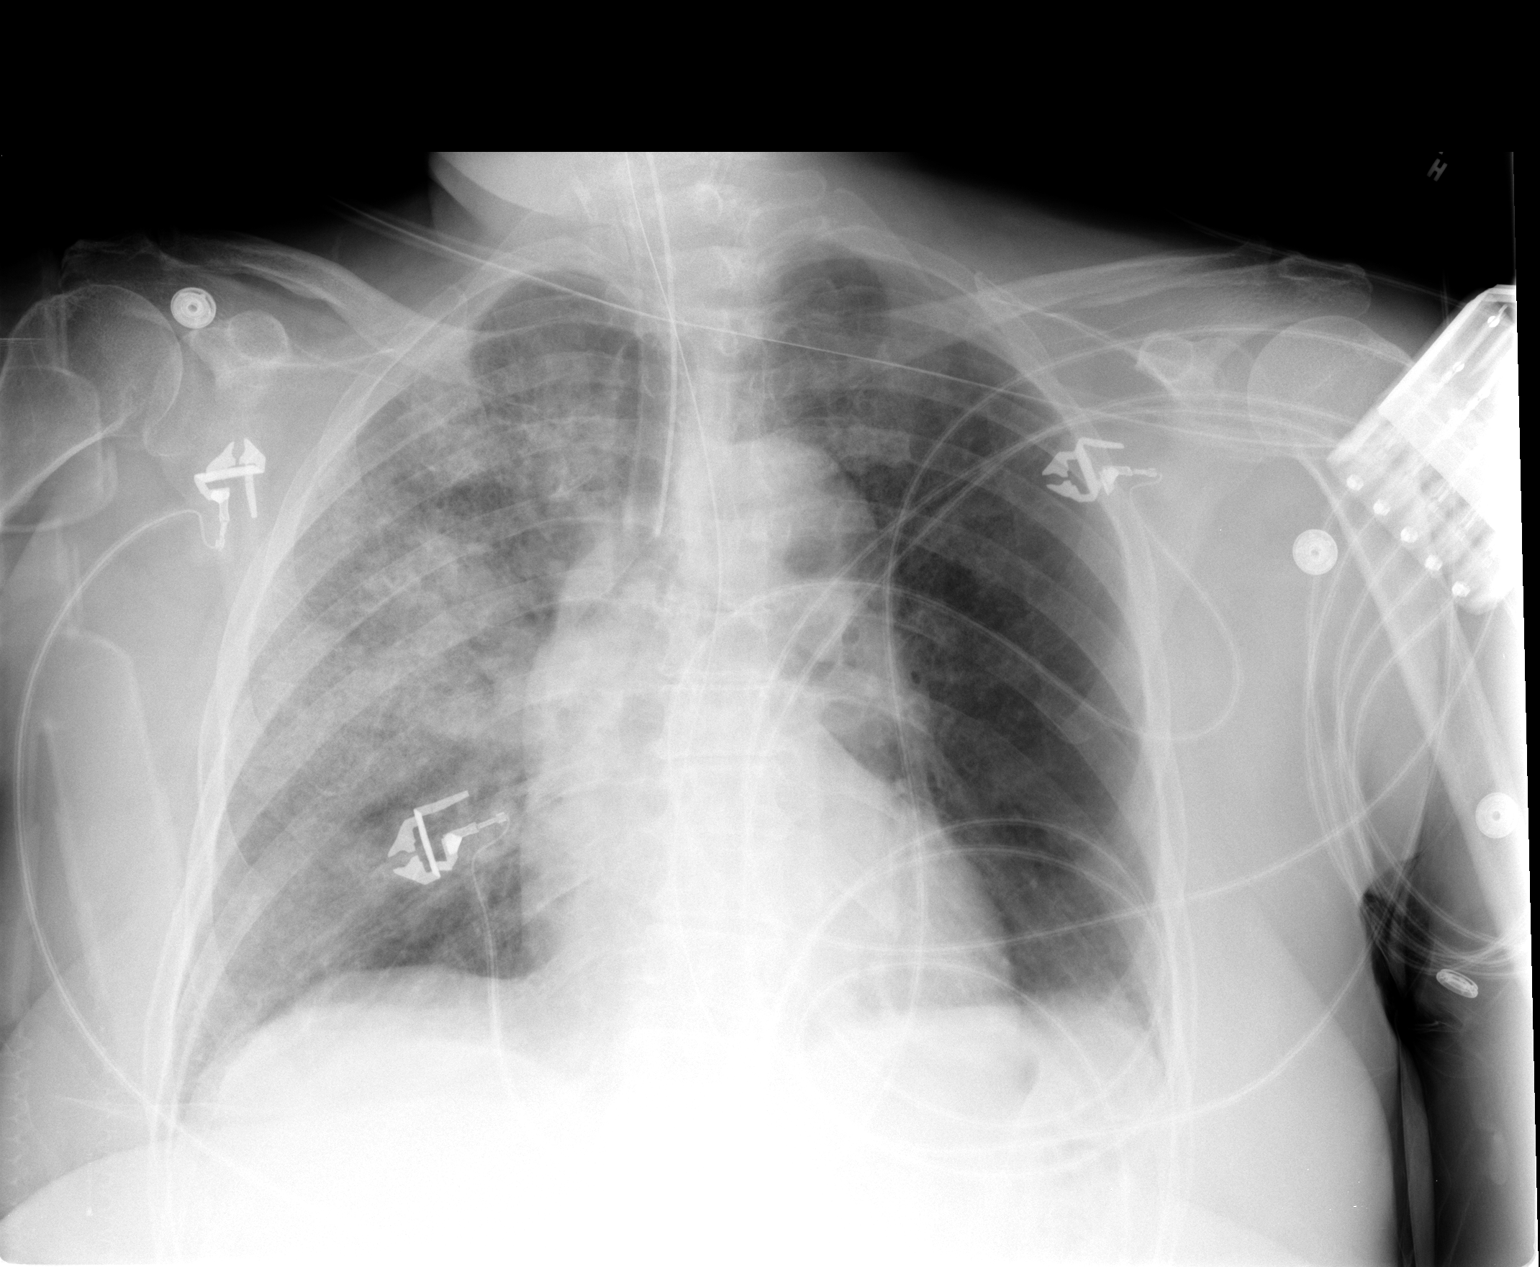

[1 of 1 positions shown; findings below may reference images not displayed]

FINDINGS: Endotracheal tube has been retracted and is now 2.8 cm
above the carina.  Improving lung volumes.  Diffuse right lung
airspace disease again noted, unchanged.  Heart is upper limits
normal in size.
IMPRESSION: Endotracheal tube now approximately 3 cm above the carina.
Improving lung volumes.  Continued diffuse right lung airspace
disease.

## 2011-10-15 ENCOUNTER — Other Ambulatory Visit (INDEPENDENT_AMBULATORY_CARE_PROVIDER_SITE_OTHER): Payer: Self-pay | Admitting: Otolaryngology

## 2011-10-15 DIAGNOSIS — E041 Nontoxic single thyroid nodule: Secondary | ICD-10-CM

## 2011-10-15 IMAGING — CR DG CHEST 1V PORT
1 series · 1 of 1 positions shown · non-contrast
Comparison: Portable exam 2900 hours compared to 06/05/2010

CLINICAL DATA: Follow up edema and acute respiratory failure

PORTABLE CHEST - 1 VIEW

[view not recorded]
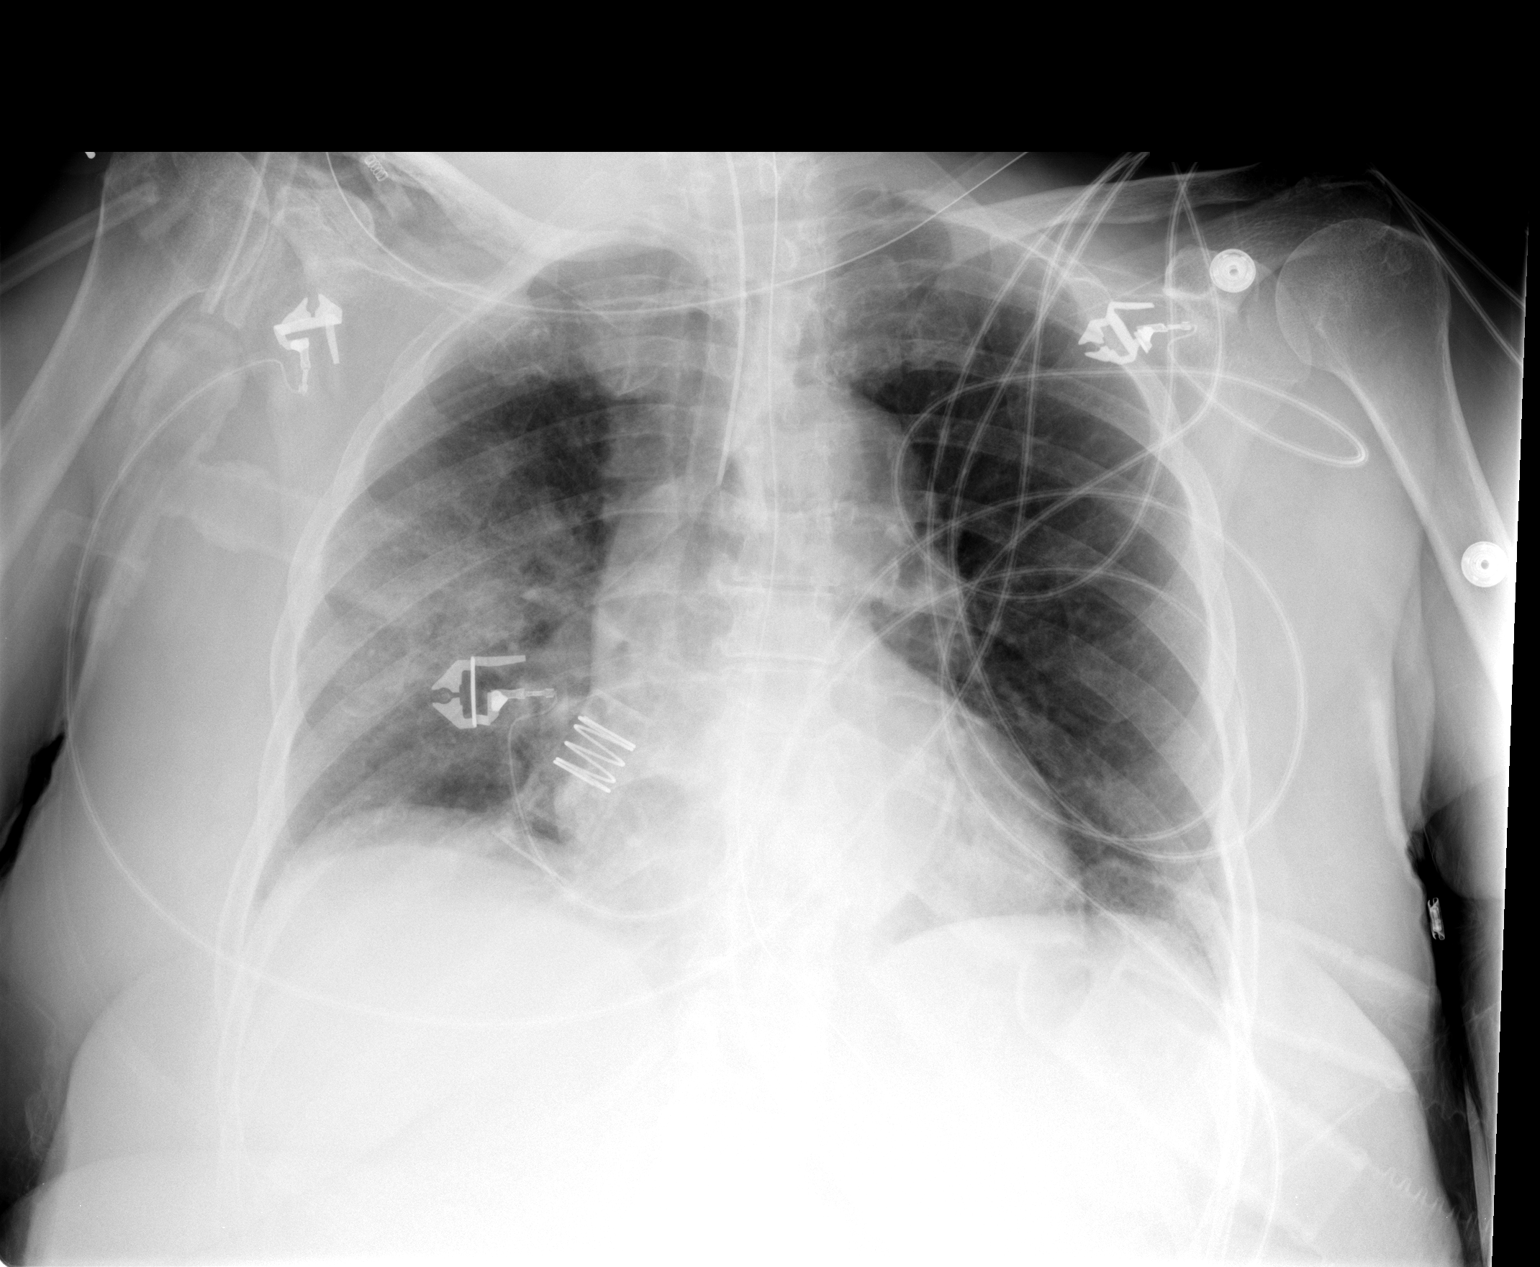

[1 of 1 positions shown; findings below may reference images not displayed]

FINDINGS: Tip of endotracheal tube 1.6 cm above carina.
Nasogastric tube extends into stomach.
Cardiac monitoring lines project over chest.
Stable heart size.
Improved right lung infiltrates.
Question minimal left base infiltrate.
Overall improved aeration since prior study.
Bones demineralized.
IMPRESSION: Improving aeration.

## 2011-10-19 ENCOUNTER — Other Ambulatory Visit (INDEPENDENT_AMBULATORY_CARE_PROVIDER_SITE_OTHER): Payer: Self-pay | Admitting: Otolaryngology

## 2011-10-19 ENCOUNTER — Ambulatory Visit (HOSPITAL_COMMUNITY)
Admission: RE | Admit: 2011-10-19 | Discharge: 2011-10-19 | Disposition: A | Discharge status: Home / Self Care | Payer: Medicare Other | Source: Ambulatory Visit | Attending: Otolaryngology | Admitting: Otolaryngology

## 2011-10-19 ENCOUNTER — Other Ambulatory Visit (HOSPITAL_COMMUNITY): Payer: Self-pay | Admitting: Otolaryngology

## 2011-10-19 ENCOUNTER — Ambulatory Visit (HOSPITAL_COMMUNITY): Admission: RE | Admit: 2011-10-19 | Payer: Medicare Other | Source: Ambulatory Visit

## 2011-10-19 DIAGNOSIS — E041 Nontoxic single thyroid nodule: Secondary | ICD-10-CM

## 2011-10-19 DIAGNOSIS — E049 Nontoxic goiter, unspecified: Secondary | ICD-10-CM | POA: Insufficient documentation

## 2011-10-19 NOTE — Discharge Instructions (Signed)
Thyroid Biopsy The thyroid gland is a butterfly-shaped gland situated in the front of the neck. It produces hormones which affect metabolism, growth and development, and body temperature. A thyroid biopsy is a procedure in which small samples of tissue or fluid are removed from the thyroid gland or mass and examined under a microscope. This test is done to determine the cause of thyroid problems, such as infection, cancer, or other thyroid problems. There are 2 ways to obtain samples: 1. Fine needle biopsy. Samples are removed using a thin needle inserted through the skin and into the thyroid gland or mass.  2. Open biopsy. Samples are removed after a cut (incision) is made through the skin.  LET YOUR CAREGIVER KNOW ABOUT:   Allergies.   Medications taken including herbs, eye drops, over-the-counter medications, and creams.   Use of steroids (by mouth or creams).   Previous problems with anesthetics or numbing medicine.   Possibility of pregnancy, if this applies.   History of blood clots (thrombophlebitis).   History of bleeding or blood problems.   Previous surgery.   Other health problems.  RISKS AND COMPLICATIONS  Bleeding from the site. The risk of bleeding is higher if you have a bleeding disorder or are taking any blood thinning medications (anticoagulants).   Infection.   Injury to structures near the thyroid gland.  BEFORE THE PROCEDURE  This is a procedure that can be done as an outpatient. Confirm the time that you need to arrive for your procedure. Confirm whether there is a need to fast or withhold any medications. A blood sample may be done to determine your blood clotting time. Medicine may be given to help you relax (sedative). PROCEDURE Fine needle biopsy. You will be awake during the procedure. You may be asked to lie on your back with your head tipped backward to extend your neck. Let your caregiver know if you cannot tolerate the positioning. An area on your  neck will be cleansed. A needle is inserted through the skin of your neck. You may feel a mild discomfort during this procedure. You may be asked to avoid coughing, talking, swallowing, or making sounds during some portions of the procedure. The needle is withdrawn once tissue or fluid samples have been removed. Pressure may be applied to the neck to reduce swelling and ensure that bleeding has stopped. The samples will be sent for examination.  Open biopsy. You will be given general anesthesia. You will be asleep during the procedure. An incision is made in your neck. A sample of thyroid tissue or the mass is removed. The tissue sample or mass will be sent for examination. The sample or mass may be examined during the biopsy. If the sample or mass contains cancer cells, some or all of the thyroid gland may be removed. The incision is closed with stitches. AFTER THE PROCEDURE  Your recovery will be assessed and monitored. If there are no problems, as an outpatient, you should be able to go home shortly after the procedure. If you had a fine needle biopsy:  You may have soreness at the biopsy site for 1 to 2 days.  If you had an open biopsy:   You may have soreness at the biopsy site for 3 to 4 days.   You may have a hoarse voice or sore throat for 1 to 2 days.  Obtaining the Test Results It is your responsibility to obtain your test results. Do not assume everything is normal if you have   not heard from your caregiver or the medical facility. It is important for you to follow up on all of your test results. HOME CARE INSTRUCTIONS   Keeping your head raised on a pillow when you are lying down may ease biopsy site discomfort.   Supporting the back of your head and neck with both hands as you sit up from a lying position may ease biopsy site discomfort.   Only take over-the-counter or prescription medicines for pain, discomfort, or fever as directed by your caregiver.   Throat lozenges or gargling  with warm salt water may help to soothe a sore throat.  SEEK IMMEDIATE MEDICAL CARE IF:   You have severe bleeding from the biopsy site.   You have difficulty swallowing.   You have a fever.   You have increased pain, swelling, redness, or warmth at the biopsy site.   You notice pus coming from the biopsy site.   You have swollen glands (lymph nodes) in your neck.  Document Released: 06/20/2007 Document Revised: 05/05/2011 Document Reviewed: 11/20/2008 [facility] Patient Information 2012 [place], [place].

## 2011-10-19 NOTE — Procedures (Signed)
PreOperative Dx: Bilateral thyroid nodules Postoperative Dx: Bilateral thyroid nodules Procedure:   FNA of bilateral thyroid nodules Radiologist:  Tyron Russell Anesthesia:  1 ml of 1% lidocaine Specimen:  FNA x three of RIGHT thyroid nodule, FNA x three of LEFT thyroid nodule EBL:   None Complications: None

## 2011-10-19 NOTE — Progress Notes (Signed)
Lidocaine 2%        1mL injected 

## 2011-10-20 ENCOUNTER — Other Ambulatory Visit: Payer: Self-pay | Admitting: Family Medicine

## 2011-10-22 ENCOUNTER — Ambulatory Visit (HOSPITAL_COMMUNITY): Payer: Medicare Other

## 2011-10-28 ENCOUNTER — Ambulatory Visit (INDEPENDENT_AMBULATORY_CARE_PROVIDER_SITE_OTHER): Payer: Medicare Other | Admitting: Otolaryngology

## 2011-10-28 DIAGNOSIS — D449 Neoplasm of uncertain behavior of unspecified endocrine gland: Secondary | ICD-10-CM

## 2011-10-28 DIAGNOSIS — K219 Gastro-esophageal reflux disease without esophagitis: Secondary | ICD-10-CM

## 2011-11-02 ENCOUNTER — Encounter (HOSPITAL_COMMUNITY): Payer: Self-pay | Admitting: Pharmacy Technician

## 2011-11-09 ENCOUNTER — Encounter (HOSPITAL_COMMUNITY)
Admission: RE | Admit: 2011-11-09 | Discharge: 2011-11-09 | Disposition: A | Discharge status: Home / Self Care | Payer: Medicare Other | Source: Ambulatory Visit | Attending: Anesthesiology | Admitting: Anesthesiology

## 2011-11-09 ENCOUNTER — Encounter (HOSPITAL_COMMUNITY): Payer: Self-pay

## 2011-11-09 ENCOUNTER — Encounter (HOSPITAL_COMMUNITY)
Admission: RE | Admit: 2011-11-09 | Discharge: 2011-11-09 | Disposition: A | Discharge status: Home / Self Care | Payer: Medicare Other | Source: Ambulatory Visit | Attending: Otolaryngology | Admitting: Otolaryngology

## 2011-11-09 ENCOUNTER — Other Ambulatory Visit: Payer: Self-pay

## 2011-11-09 HISTORY — DX: Headache: R51

## 2011-11-09 HISTORY — DX: Pneumonia, unspecified organism: J18.9

## 2011-11-09 HISTORY — DX: Gastro-esophageal reflux disease without esophagitis: K21.9

## 2011-11-09 HISTORY — DX: Phlebitis and thrombophlebitis of unspecified site: I80.9

## 2011-11-09 HISTORY — DX: Shortness of breath: R06.02

## 2011-11-09 HISTORY — DX: Disorder of thyroid, unspecified: E07.9

## 2011-11-09 HISTORY — DX: Anemia, unspecified: D64.9

## 2011-11-09 HISTORY — DX: Sleep apnea, unspecified: G47.30

## 2011-11-09 HISTORY — DX: Diarrhea, unspecified: R19.7

## 2011-11-09 HISTORY — DX: Nocturia: R35.1

## 2011-11-09 LAB — BASIC METABOLIC PANEL
BUN: 16 mg/dL (ref 6–23)
CO2: 29 mEq/L (ref 19–32)
Calcium: 10.1 mg/dL (ref 8.4–10.5)
Chloride: 102 mEq/L (ref 96–112)
Creatinine, Ser: 0.84 mg/dL (ref 0.50–1.10)
GFR calc Af Amer: 77 mL/min — ABNORMAL LOW (ref >90)
GFR calc non Af Amer: 66 mL/min — ABNORMAL LOW (ref >90)
Glucose, Bld: 126 mg/dL — ABNORMAL HIGH (ref 70–99)
Potassium: 3.4 mEq/L — ABNORMAL LOW (ref 3.5–5.1)
Sodium: 139 mEq/L (ref 135–145)

## 2011-11-09 LAB — SURGICAL PCR SCREEN
MRSA, PCR: NEGATIVE
Staphylococcus aureus: NEGATIVE

## 2011-11-09 LAB — CBC
HCT: 38.3 % (ref 36.0–46.0)
Hemoglobin: 12.5 g/dL (ref 12.0–15.0)
MCH: 29.1 pg (ref 26.0–34.0)
MCHC: 32.6 g/dL (ref 30.0–36.0)
MCV: 89.1 fL (ref 78.0–100.0)
Platelets: 232 10*3/uL (ref 150–400)
RBC: 4.3 MIL/uL (ref 3.87–5.11)
RDW: 13.7 % (ref 11.5–15.5)
WBC: 4.9 10*3/uL (ref 4.0–10.5)

## 2011-11-09 NOTE — Progress Notes (Addendum)
Sleep study done about 66yrs ago-to request report from Dr.Margaret [name];pt doesn't use CPAP or BiPap;per [name] if office unable to obtain then it will be OK

## 2011-11-09 NOTE — Progress Notes (Signed)
Per MD office no sleep study done

## 2011-11-09 NOTE — Pre-Procedure Instructions (Signed)
20 [name]  11/09/2011   Your procedure is scheduled on:  Wed, Mar 13 @ 0830  Report to [facility] at 0630 AM.  Call this number if you have problems the morning of surgery: [phone]   Remember:   Do not eat food:After Midnight.  May have clear liquids: up to 4 Hours before arrival.(until 2:30 am)  Clear liquids include soda, tea, black coffee, apple or grape juice, broth.  Take these medicines the morning of surgery with A SIP OF WATER: Amlodipine,Pain Pill(if needed),Skelaxin,Metoprolol,and Prilosec   Do not wear jewelry, make-up or nail polish.  Do not wear lotions, powders, or perfumes. You may wear deodorant.  Do not shave 48 hours prior to surgery.  Do not bring valuables to the hospital.  Contacts, dentures or bridgework may not be worn into surgery.  Leave suitcase in the car. After surgery it may be brought to your room.  For patients admitted to the hospital, checkout time is 11:00 AM the day of discharge.   Patients discharged the day of surgery will not be allowed to drive home.  Name and phone number of your driver:   Special Instructions: CHG Shower Use Special Wash: 1/2 bottle night before surgery and 1/2 bottle morning of surgery.   Please read over the following fact sheets that you were given: Pain Booklet, Coughing and Deep Breathing, MRSA Information and Surgical Site Infection Prevention

## 2011-11-09 NOTE — Progress Notes (Addendum)
Cardiologist is with [facility];next visit in 1 month-12/2009 was last visit-to request report  Echo in epic 05/2010  Heart cath in epic 05/2010  Stress test to be requested from [facility]

## 2011-11-17 ENCOUNTER — Encounter (HOSPITAL_COMMUNITY): Payer: Self-pay

## 2011-11-17 ENCOUNTER — Ambulatory Visit (HOSPITAL_COMMUNITY): Payer: Medicare Other

## 2011-11-17 ENCOUNTER — Encounter (HOSPITAL_COMMUNITY): Payer: Self-pay | Admitting: *Deleted

## 2011-11-17 ENCOUNTER — Ambulatory Visit (HOSPITAL_COMMUNITY)
Admission: RE | Admit: 2011-11-17 | Discharge: 2011-11-18 | Disposition: A | Discharge status: Home / Self Care | Payer: Medicare Other | Source: Ambulatory Visit | Attending: Otolaryngology | Admitting: Otolaryngology

## 2011-11-17 ENCOUNTER — Encounter (HOSPITAL_COMMUNITY)
Admission: RE | Disposition: A | Discharge status: Home / Self Care | Payer: Self-pay | Source: Ambulatory Visit | Attending: Otolaryngology

## 2011-11-17 DIAGNOSIS — Z01812 Encounter for preprocedural laboratory examination: Secondary | ICD-10-CM | POA: Insufficient documentation

## 2011-11-17 DIAGNOSIS — I2510 Atherosclerotic heart disease of native coronary artery without angina pectoris: Secondary | ICD-10-CM | POA: Insufficient documentation

## 2011-11-17 DIAGNOSIS — E042 Nontoxic multinodular goiter: Secondary | ICD-10-CM | POA: Insufficient documentation

## 2011-11-17 DIAGNOSIS — I10 Essential (primary) hypertension: Secondary | ICD-10-CM | POA: Insufficient documentation

## 2011-11-17 DIAGNOSIS — R51 Headache: Secondary | ICD-10-CM | POA: Insufficient documentation

## 2011-11-17 DIAGNOSIS — E119 Type 2 diabetes mellitus without complications: Secondary | ICD-10-CM | POA: Insufficient documentation

## 2011-11-17 DIAGNOSIS — Z01810 Encounter for preprocedural cardiovascular examination: Secondary | ICD-10-CM | POA: Insufficient documentation

## 2011-11-17 DIAGNOSIS — K219 Gastro-esophageal reflux disease without esophagitis: Secondary | ICD-10-CM | POA: Insufficient documentation

## 2011-11-17 DIAGNOSIS — R0602 Shortness of breath: Secondary | ICD-10-CM | POA: Insufficient documentation

## 2011-11-17 DIAGNOSIS — Z23 Encounter for immunization: Secondary | ICD-10-CM

## 2011-11-17 DIAGNOSIS — Z01818 Encounter for other preprocedural examination: Secondary | ICD-10-CM | POA: Insufficient documentation

## 2011-11-17 HISTORY — PX: THYROIDECTOMY: SHX17

## 2011-11-17 SURGERY — THYROIDECTOMY
Anesthesia: General | Site: Neck | Laterality: Right | Wound class: Clean

## 2011-11-17 MED ORDER — PROMETHAZINE HCL 12.5 MG PO TABS
12.5000 mg | ORAL_TABLET | Freq: Four times a day (QID) | ORAL | Status: DC | PRN
  Filled 2011-11-17: qty 1

## 2011-11-17 MED ORDER — CALCIUM CARBONATE 1250 (500 CA) MG PO TABS
1.0000 | ORAL_TABLET | Freq: Every day | ORAL | Status: DC
  Administered 2011-11-17: 500 mg via ORAL
  Filled 2011-11-17 (×2): qty 1

## 2011-11-17 MED ORDER — FENTANYL CITRATE 0.05 MG/ML IJ SOLN
INTRAMUSCULAR | Status: DC | PRN
  Administered 2011-11-17 (×4): 50 ug via INTRAVENOUS

## 2011-11-17 MED ORDER — ACETAMINOPHEN 160 MG/5ML PO SOLN: 650.0000 mg | ORAL | Status: DC | PRN

## 2011-11-17 MED ORDER — FERROUS SULFATE 325 (65 FE) MG PO TABS
325.0000 mg | ORAL_TABLET | Freq: Every day | ORAL | Status: DC
  Filled 2011-11-17 (×2): qty 1

## 2011-11-17 MED ORDER — HYDROCODONE-ACETAMINOPHEN 5-325 MG PO TABS
1.0000 | ORAL_TABLET | ORAL | Status: DC | PRN
  Administered 2011-11-17: 1 via ORAL
  Administered 2011-11-17: 2 via ORAL
  Administered 2011-11-18: 1 via ORAL
  Filled 2011-11-17: qty 2
  Filled 2011-11-17: qty 1

## 2011-11-17 MED ORDER — SIMVASTATIN 20 MG PO TABS
20.0000 mg | ORAL_TABLET | Freq: Every day | ORAL | Status: DC
  Administered 2011-11-17: 20 mg via ORAL
  Filled 2011-11-17 (×2): qty 1

## 2011-11-17 MED ORDER — ONDANSETRON HCL 4 MG/2ML IJ SOLN
INTRAMUSCULAR | Status: DC | PRN
  Administered 2011-11-17: 4 mg via INTRAVENOUS

## 2011-11-17 MED ORDER — CLINDAMYCIN PHOSPHATE 600 MG/50ML IV SOLN
INTRAVENOUS | Status: DC | PRN
  Administered 2011-11-17: 600 mg via INTRAVENOUS

## 2011-11-17 MED ORDER — CLINDAMYCIN PHOSPHATE 300 MG/50ML IV SOLN
300.0000 mg | INTRAVENOUS | Status: DC
  Filled 2011-11-17: qty 50

## 2011-11-17 MED ORDER — AMLODIPINE BESYLATE 5 MG PO TABS
5.0000 mg | ORAL_TABLET | Freq: Every day | ORAL | Status: DC
  Administered 2011-11-17: 5 mg via ORAL
  Filled 2011-11-17 (×2): qty 1

## 2011-11-17 MED ORDER — FENTANYL CITRATE 0.05 MG/ML IJ SOLN: 50.0000 ug | INTRAMUSCULAR | Status: DC | PRN

## 2011-11-17 MED ORDER — HYDROMORPHONE HCL PF 1 MG/ML IJ SOLN
INTRAMUSCULAR | Status: AC
  Filled 2011-11-17: qty 1

## 2011-11-17 MED ORDER — MIDAZOLAM HCL 5 MG/5ML IJ SOLN
INTRAMUSCULAR | Status: DC | PRN
  Administered 2011-11-17: 1 mg via INTRAVENOUS

## 2011-11-17 MED ORDER — PANTOPRAZOLE SODIUM 40 MG PO TBEC
40.0000 mg | DELAYED_RELEASE_TABLET | Freq: Every day | ORAL | Status: DC
  Administered 2011-11-17: 40 mg via ORAL
  Filled 2011-11-17: qty 1

## 2011-11-17 MED ORDER — METOPROLOL TARTRATE 25 MG PO TABS
25.0000 mg | ORAL_TABLET | Freq: Two times a day (BID) | ORAL | Status: DC
  Administered 2011-11-17: 25 mg via ORAL
  Filled 2011-11-17 (×3): qty 1

## 2011-11-17 MED ORDER — MIDAZOLAM HCL 2 MG/2ML IJ SOLN: 1.0000 mg | INTRAMUSCULAR | Status: DC | PRN

## 2011-11-17 MED ORDER — PROMETHAZINE HCL 12.5 MG RE SUPP
12.5000 mg | Freq: Four times a day (QID) | RECTAL | Status: DC | PRN
  Filled 2011-11-17: qty 1

## 2011-11-17 MED ORDER — HYDROCODONE-ACETAMINOPHEN 5-325 MG PO TABS
ORAL_TABLET | ORAL | Status: AC
  Filled 2011-11-17: qty 2

## 2011-11-17 MED ORDER — EPHEDRINE SULFATE 50 MG/ML IJ SOLN
INTRAMUSCULAR | Status: DC | PRN
  Administered 2011-11-17: 5 mg via INTRAVENOUS

## 2011-11-17 MED ORDER — ACETAMINOPHEN 650 MG RE SUPP: 650.0000 mg | RECTAL | Status: DC | PRN

## 2011-11-17 MED ORDER — LORAZEPAM 2 MG/ML IJ SOLN: 1.0000 mg | Freq: Once | INTRAMUSCULAR | Status: DC | PRN

## 2011-11-17 MED ORDER — DIPHENHYDRAMINE HCL 50 MG/ML IJ SOLN
12.5000 mg | INTRAMUSCULAR | Status: DC | PRN
  Administered 2011-11-17: 12.5 mg via INTRAVENOUS
  Filled 2011-11-17: qty 1

## 2011-11-17 MED ORDER — HYDROMORPHONE HCL PF 1 MG/ML IJ SOLN
0.2500 mg | INTRAMUSCULAR | Status: DC | PRN
  Administered 2011-11-17: 0.5 mg via INTRAVENOUS
  Administered 2011-11-17: 0.25 mg via INTRAVENOUS
  Administered 2011-11-17: 0.5 mg via INTRAVENOUS
  Administered 2011-11-17: 0.25 mg via INTRAVENOUS

## 2011-11-17 MED ORDER — CEFAZOLIN SODIUM 1-5 GM-% IV SOLN
INTRAVENOUS | Status: AC
  Filled 2011-11-17: qty 50

## 2011-11-17 MED ORDER — ZOLPIDEM TARTRATE 5 MG PO TABS: 5.0000 mg | ORAL_TABLET | Freq: Every evening | ORAL | Status: DC | PRN

## 2011-11-17 MED ORDER — PROPOFOL 10 MG/ML IV EMUL
INTRAVENOUS | Status: DC | PRN
  Administered 2011-11-17: 150 mg via INTRAVENOUS
  Administered 2011-11-17: 20 mg via INTRAVENOUS
  Administered 2011-11-17: 30 mg via INTRAVENOUS

## 2011-11-17 MED ORDER — 0.9 % SODIUM CHLORIDE (POUR BTL) OPTIME
TOPICAL | Status: DC | PRN
  Administered 2011-11-17: 1000 mL

## 2011-11-17 MED ORDER — SUCCINYLCHOLINE CHLORIDE 20 MG/ML IJ SOLN
INTRAMUSCULAR | Status: DC | PRN
  Administered 2011-11-17: 120 mg via INTRAVENOUS

## 2011-11-17 MED ORDER — MORPHINE SULFATE 4 MG/ML IJ SOLN: 2.0000 mg | INTRAMUSCULAR | Status: DC | PRN

## 2011-11-17 MED ORDER — METAXALONE 800 MG PO TABS
800.0000 mg | ORAL_TABLET | Freq: Three times a day (TID) | ORAL | Status: DC
  Administered 2011-11-17 (×2): 800 mg via ORAL
  Filled 2011-11-17 (×4): qty 1

## 2011-11-17 MED ORDER — CLINDAMYCIN PHOSPHATE 300 MG/50ML IV SOLN
300.0000 mg | Freq: Three times a day (TID) | INTRAVENOUS | Status: AC
  Administered 2011-11-17 – 2011-11-18 (×3): 300 mg via INTRAVENOUS
  Filled 2011-11-17 (×3): qty 50

## 2011-11-17 MED ORDER — LACTATED RINGERS IV SOLN
INTRAVENOUS | Status: DC | PRN
  Administered 2011-11-17 (×2): via INTRAVENOUS

## 2011-11-17 MED ORDER — LIDOCAINE-EPINEPHRINE 1 %-1:100000 IJ SOLN
INTRAMUSCULAR | Status: DC | PRN
  Administered 2011-11-17: 3.5 mL

## 2011-11-17 MED ORDER — METAXALONE 800 MG PO TABS
800.0000 mg | ORAL_TABLET | ORAL | Status: DC
  Filled 2011-11-17: qty 1

## 2011-11-17 MED ORDER — CLINDAMYCIN PHOSPHATE 600 MG/4ML IJ SOLN
INTRAMUSCULAR | Status: AC
  Filled 2011-11-17: qty 4

## 2011-11-17 MED ORDER — DEXTROSE 5 % IV SOLN
INTRAVENOUS | Status: AC
  Filled 2011-11-17: qty 50

## 2011-11-17 MED ORDER — KCL IN DEXTROSE-NACL 20-5-0.45 MEQ/L-%-% IV SOLN
INTRAVENOUS | Status: DC
  Administered 2011-11-17 – 2011-11-18 (×2): via INTRAVENOUS
  Filled 2011-11-17 (×4): qty 1000

## 2011-11-17 SURGICAL SUPPLY — 45 items
ADH SKN CLS APL DERMABOND .7 (GAUZE/BANDAGES/DRESSINGS) ×1
ATTRACTOMAT 16X20 MAGNETIC DRP (DRAPES) IMPLANT
BLADE SURG 15 STRL LF DISP TIS (BLADE) IMPLANT
BLADE SURG 15 STRL SS (BLADE)
BLADE SURG CLIPPER 3M 9600 (MISCELLANEOUS) IMPLANT
CANISTER SUCTION 2500CC (MISCELLANEOUS) ×2 IMPLANT
CLEANER TIP ELECTROSURG 2X2 (MISCELLANEOUS) ×2 IMPLANT
CLIP TI WIDE RED SMALL 24 (CLIP) IMPLANT
CLOTH BEACON ORANGE TIMEOUT ST (SAFETY) ×2 IMPLANT
CONT SPEC 4OZ CLIKSEAL STRL BL (MISCELLANEOUS) ×1 IMPLANT
CORDS BIPOLAR (ELECTRODE) ×2 IMPLANT
COVER SURGICAL LIGHT HANDLE (MISCELLANEOUS) ×2 IMPLANT
DERMABOND ADVANCED (GAUZE/BANDAGES/DRESSINGS) ×1
DERMABOND ADVANCED .7 DNX12 (GAUZE/BANDAGES/DRESSINGS) ×1 IMPLANT
DRAIN CHANNEL 10F 3/8 F FF (DRAIN) ×1 IMPLANT
DRAIN CHANNEL 7F 3/4 FLAT (WOUND CARE) ×1 IMPLANT
ELECT COATED BLADE 2.86 ST (ELECTRODE) ×2 IMPLANT
ELECT REM PT RETURN 9FT ADLT (ELECTROSURGICAL) ×2
ELECTRODE REM PT RTRN 9FT ADLT (ELECTROSURGICAL) ×1 IMPLANT
EVACUATOR SILICONE 100CC (DRAIN) ×2 IMPLANT
GAUZE SPONGE 4X4 16PLY XRAY LF (GAUZE/BANDAGES/DRESSINGS) ×4 IMPLANT
GLOVE BIOGEL PI IND STRL 6.5 (GLOVE) ×1 IMPLANT
GLOVE BIOGEL PI INDICATOR 6.5 (GLOVE) ×1
GLOVE ECLIPSE 6.5 STRL STRAW (GLOVE) ×1 IMPLANT
GLOVE ECLIPSE 7.5 STRL STRAW (GLOVE) ×2 IMPLANT
GOWN STRL NON-REIN LRG LVL3 (GOWN DISPOSABLE) ×5 IMPLANT
KIT BASIN OR (CUSTOM PROCEDURE TRAY) ×2 IMPLANT
KIT ROOM TURNOVER OR (KITS) ×2 IMPLANT
LOCATOR NERVE 3 VOLT (DISPOSABLE) ×1 IMPLANT
NS IRRIG 1000ML POUR BTL (IV SOLUTION) ×2 IMPLANT
PAD ARMBOARD 7.5X6 YLW CONV (MISCELLANEOUS) ×2 IMPLANT
PENCIL BUTTON HOLSTER BLD 10FT (ELECTRODE) ×2 IMPLANT
SHEARS HARMONIC 9CM CVD (BLADE) ×1 IMPLANT
SPONGE INTESTINAL PEANUT (DISPOSABLE) ×3 IMPLANT
STAPLER VISISTAT 35W (STAPLE) ×2 IMPLANT
SUT SILK 2 0 FS (SUTURE) ×2 IMPLANT
SUT SILK 2 0 REEL (SUTURE) IMPLANT
SUT SILK 3 0 REEL (SUTURE) ×2 IMPLANT
SUT VICRYL 4 0 EN S 48 D7796 (SUTURE) ×1 IMPLANT
SUT VICRYL 4-0 PS2 18IN ABS (SUTURE) ×4 IMPLANT
TOWEL OR 17X24 6PK STRL BLUE (TOWEL DISPOSABLE) ×1 IMPLANT
TOWEL OR 17X26 10 PK STRL BLUE (TOWEL DISPOSABLE) ×2 IMPLANT
TRAY ENT MC OR (CUSTOM PROCEDURE TRAY) ×2 IMPLANT
TRAY FOLEY CATH 14FRSI W/METER (CATHETERS) IMPLANT
WATER STERILE IRR 1000ML POUR (IV SOLUTION) ×2 IMPLANT

## 2011-11-17 NOTE — Preoperative (Signed)
Beta Blockers   Reason not to administer Beta Blockers:Not Applicable 

## 2011-11-17 NOTE — Anesthesia Postprocedure Evaluation (Signed)
  Anesthesia Post-op Note  Patient: [name]  Procedure(s) Performed: Procedure(s) (LRB): THYROIDECTOMY (Right)  Patient Location: PACU  Anesthesia Type: General  Level of Consciousness: awake  Airway and Oxygen Therapy: Patient Spontanous Breathing  Post-op Pain: mild  Post-op Assessment: Post-op Vital signs reviewed, Patient's Cardiovascular Status Stable, Respiratory Function Stable, Patent Airway and Pain level controlled  Post-op Vital Signs: stable  Complications: No apparent anesthesia complications

## 2011-11-17 NOTE — Brief Op Note (Signed)
11/17/2011  10:33 AM  PATIENT:  [name]  78 y.o. female  PRE-OPERATIVE DIAGNOSIS:  RIGHT THYROID MASS  POST-OPERATIVE DIAGNOSIS:  RIGHT THYROID MASS  PROCEDURE:  Procedure(s) (LRB): HEMITHYROIDECTOMY (Right)  SURGEON:  Surgeon(s) and Role:    * Darletta Moll, MD - Primary  PHYSICIAN ASSISTANT:   ASSISTANTS: Babs Bertin, RNFA   ANESTHESIA:   general  EBL:  Total I/O In: 1300 [I.V.:1300] Out: 13 [Blood:13]  BLOOD ADMINISTERED:none  DRAINS: Penrose drain in the neck   LOCAL MEDICATIONS USED:  LIDOCAINE  and Amount: 3.5 ml  SPECIMEN:  Source of Specimen:  Right thyroid lobe  DISPOSITION OF SPECIMEN:  PATHOLOGY  COUNTS:  YES  TOURNIQUET:  * No tourniquets in log *  DICTATION: .Other Dictation: Dictation Number [id number]  PLAN OF CARE: Admit for overnight observation  PATIENT DISPOSITION:  PACU - hemodynamically stable.   Delay start of Pharmacological VTE agent (>24hrs) due to surgical blood loss or risk of bleeding: no

## 2011-11-17 NOTE — Anesthesia Preprocedure Evaluation (Addendum)
Anesthesia Evaluation  Patient identified by MRN, date of birth, ID band Patient awake    Reviewed: Allergy & Precautions, H&P , NPO status , Patient's Chart, lab work & pertinent test results  History of Anesthesia Complications (+) DIFFICULT AIRWAY  Airway Mallampati: II TM Distance: >3 FB     Dental  (+) Dental Advidsory Given   Pulmonary shortness of breath, sleep apnea ,    Pulmonary exam normal       Cardiovascular hypertension, + CAD     Neuro/Psych  Headaches,  Neuromuscular disease    GI/Hepatic PUD, GERD-  ,  Endo/Other  Diabetes mellitus-  Renal/GU      Musculoskeletal   Abdominal (+) + obese,   Peds  Hematology   Anesthesia Other Findings   Reproductive/Obstetrics                          Anesthesia Physical Anesthesia Plan  ASA: III  Anesthesia Plan: General   Post-op Pain Management:    Induction: Intravenous  Airway Management Planned: Oral ETT and Video Laryngoscope Planned  Additional Equipment:   Intra-op Plan:   Post-operative Plan: Extubation in OR  Informed Consent: I have reviewed the patients History and Physical, chart, labs and discussed the procedure including the risks, benefits and alternatives for the proposed anesthesia with the patient or authorized representative who has indicated his/her understanding and acceptance.   Dental Advisory Given  Plan Discussed with: CRNA and Surgeon  Anesthesia Plan Comments:       Anesthesia Quick Evaluation

## 2011-11-17 NOTE — Anesthesia Procedure Notes (Signed)
Procedure Name: Intubation Date/Time: 11/17/2011 8:36 AM Performed by: Carmela Rima Pre-anesthesia Checklist: Emergency Drugs available, Patient identified, Timeout performed, Suction available and Patient being monitored Patient Re-evaluated:Patient Re-evaluated prior to inductionOxygen Delivery Method: Circle system utilized Preoxygenation: Pre-oxygenation with 100% oxygen Intubation Type: IV induction and Rapid sequence Laryngoscope size: Adult Glide Scope. Grade View: Grade II Tube type: Oral Tube size: 7.5 mm Number of attempts: 1 Placement Confirmation: ETT inserted through vocal cords under direct vision,  positive ETCO2 and breath sounds checked- equal and bilateral Secured at: 21 cm Tube secured with: Tape Dental Injury: Teeth and Oropharynx as per pre-operative assessment

## 2011-11-17 NOTE — Transfer of Care (Signed)
Immediate Anesthesia Transfer of Care Note  Patient: [name]  Procedure(s) Performed: Procedure(s) (LRB): THYROIDECTOMY (Right)  Patient Location: PACU  Anesthesia Type: General  Level of Consciousness: awake and oriented  Airway & Oxygen Therapy: Patient Spontanous Breathing and Patient connected to nasal cannula oxygen  Post-op Assessment: Report given to PACU RN, Post -op Vital signs reviewed and stable and Patient moving all extremities  Post vital signs: Reviewed and stable  Complications: No apparent anesthesia complications

## 2011-11-17 NOTE — H&P (Signed)
H&P Update  Pt's original H&P dated 10/28/11 reviewed and placed in chart (to be scanned).  I personally examined the patient today.  No change in health. Proceed with right hemithyroidectomy.

## 2011-11-17 NOTE — Op Note (Signed)
[name], SANDEFUR NO.:  [id number]  MEDICAL RECORD NO.:  [id number]  LOCATION:  [facility]                         FACILITY:  [facility]  PHYSICIAN:  Newman Pies, MD            DATE OF BIRTH:  18-Sep-1933  DATE OF PROCEDURE:  11/17/2011 DATE OF DISCHARGE:                              OPERATIVE REPORT   SURGEON:  Newman Pies, MD  PREOPERATIVE DIAGNOSIS:  Right thyroid nodule (follicular lesion).  POSTOPERATIVE DIAGNOSIS:  Right thyroid nodule (follicular lesion).  PROCEDURE PERFORMED:  Right hemithyroidectomy.  ANESTHESIA:  General endotracheal tube anesthesia.  COMPLICATIONS:  None.  ESTIMATED BLOOD LOSS:  Less than 20 mL.  INDICATION FOR PROCEDURE:  The patient is a 78 year old female with a history of bilateral thyroid nodules.  She previously underwent fine- needle aspiration of both thyroid nodules.  The right thyroid nodule was consistent with nonneoplastic goiter.  However, the right thyroid nodule was concerning for follicular neoplasm.  Based on the above findings, the decision was made for the patient to undergo the right hemithyroidectomy procedure.  The risks, benefits, alternatives, and details of the procedure were discussed with the patient.  Questions were invited and answered.  Informed consent was obtained.  DESCRIPTION:  The patient was taken to the operating room and placed supine on the operating table.  General endotracheal tube anesthesia was administered by the anesthesiologist.  Preop IV antibiotics were given. The patient was positioned and prepped and draped in a standard fashion for right hemithyroidectomy.  A 1% lidocaine 1:100,000 epinephrine was injected at the planned site of incisions.  A transverse incision was made over the right thyroid lobe.  The incision was carried down to the level of the platysma muscles.  Superiorly based and inferiorly based subplatysmal flaps were elevated in a standard fashion.  The strap muscles were  divided at midline and retracted laterally.  The right thyroid lobe was exposed.  The right thyroid lobe was carefully resected free from the surrounding soft tissue.  Two parathyroid glands were identified and preserved.  The recurrent laryngeal nerve was also identified and preserved.  The recurrent laryngeal nerve was noted to be functional throughout the case.  Its function was evaluated with a portable nerve stimulator.  All right-sided thyroid vasculature was ligated with a combination of suture ligations and a Harmonic Scalpels. The entire right thyroid lobe and a portion of the isthmus were removed. They were sent to the pathology department for frozen section analysis. However, evaluation of the nodule was inconclusive.  It was deferred for permanent histologic analysis.  The surgical site was copiously irrigated, the strap muscles were closed with interrupted 4-0 Vicryl sutures.  The skin incision was closed in layers with 4-0 Vicryl and Dermabond.  A #10 JP drain was also placed.  The care of the patient was turned over to the anesthesiologist.  The patient was awakened from anesthesia without difficulty.  She was extubated and transferred to the recovery room in good condition.  OPERATIVE FINDINGS:  The right thyroid lobe was removed without difficulty.  Two parathyroid glands and the recurrent laryngeal nerve were identified and preserved.  SPECIMEN:  Right thyroid lobe.  FOLLOWUP  CARE:  The patient will be observed overnight in the hospital. She will most likely be discharged home on postop day #1.  She will follow up in my office in 1 week.     Newman Pies, MD     ST/MEDQ  D:  11/17/2011  T:  11/17/2011  Job:  [id number]  cc:   Milus Mallick. Lodema Hong, M.D.

## 2011-11-18 NOTE — Discharge Summary (Signed)
Physician Discharge Summary  Patient ID: [name] MRN: [id number] DOB/AGE: 1934-05-05 78 y.o.  Admit date: 11/17/2011 Discharge date: 11/18/2011  Admission Diagnoses: Right thyroid mass  Discharge Diagnoses: same Active Problems:  * No active hospital problems. *    Discharged Condition: good  Hospital Course: The patient had an uneventful course overnight.  JP drain removed on POD#1. Voice is strong. Incision C/D/I.  Consults: None  Significant Diagnostic Studies: None  Treatments: surgery: Right hemithyroidectomy  Discharge Exam: Blood pressure 121/69, pulse 64, temperature 98.4 F (36.9 C), temperature source Oral, resp. rate 19, height 5\' 1"  (1.549 m), weight 82 kg (180 lb 12.4 oz), SpO2 95.00%. Incision/Wound: C/D/I Voice is strong.  Disposition:   Discharge Orders    Future Appointments: Provider: Department: Dept Phone: Center:   01/10/2012 1:00 PM [name], MD [facility] [phone] [facility]     Future Orders Please Complete By Expires   Diet general      Increase activity slowly        Medication List  As of 11/18/2011  8:46 AM   STOP taking these medications         aspirin EC 81 MG tablet         TAKE these medications         acyclovir ointment 5 %   Commonly known as: ZOVIRAX   Apply 1 application topically every 8 (eight) hours as needed. For fever blisters      amLODipine 5 MG tablet   Commonly known as: NORVASC   Take 5 mg by mouth daily.      atorvastatin 20 MG tablet   Commonly known as: LIPITOR   Take 20 mg by mouth at bedtime.      B-12 DOTS 500 MCG Subl   Generic drug: Cyanocobalamin   Place 500 mcg under the tongue daily.      calcium carbonate 1250 MG tablet   Commonly known as: OS-CAL - dosed in mg of elemental calcium   Take 1 tablet by mouth daily.      clotrimazole-betamethasone cream   Commonly known as: LOTRISONE   Apply 1 application topically 2 (two) times daily as needed. For dermatitis     EPIPEN 2-PAK 0.3 mg/0.3 mL Devi   Generic drug: EPINEPHrine   Inject 0.3 mg into the muscle once as needed. For severe allergic reactions      ferrous sulfate 325 (65 FE) MG tablet   Take 325 mg by mouth daily with breakfast.      HYDROcodone-acetaminophen 7.5-750 MG per tablet   Commonly known as: VICODIN ES   Take 1 tablet by mouth every 6 (six) hours as needed. For pain      metaxalone 800 MG tablet   Commonly known as: SKELAXIN   Take 800 mg by mouth 3 (three) times daily.      metoprolol tartrate 25 MG tablet   Commonly known as: LOPRESSOR   Take 25 mg by mouth 2 (two) times daily.      mulitivitamin with minerals Tabs   Take 1 tablet by mouth daily.      nitroGLYCERIN 0.4 MG SL tablet   Commonly known as: NITROSTAT   Place 0.4 mg under the tongue every 5 (five) minutes as needed. For chest pain      nystatin powder   Commonly known as: MYCOSTATIN   Apply 0.5 g topically 3 (three) times daily. To affected area      omeprazole 40 MG  capsule   Commonly known as: PRILOSEC   Take 40 mg by mouth daily.      pyridoxine 200 MG tablet   Commonly known as: B-6   Take 200 mg by mouth 2 (two) times daily.      vitamin C 500 MG tablet   Commonly known as: ASCORBIC ACID   Take 500 mg by mouth daily.           Follow-up Information    Follow up with Darletta Moll, MD in 1 week. (as scheduled)    Contact information:   [facility] ENT Office [phone]          Signed: Darletta Moll 11/18/2011, 8:46 AM

## 2011-11-18 NOTE — Discharge Instructions (Signed)
Thyroidectomy Care After Refer to this sheet in the next few weeks. These instructions provide you with general information on caring for yourself after you leave the hospital. Your caregiver also may give you specific instructions. Your treatment has been planned according to the most current medical practices available, but problems sometimes occur. Call your caregiver if you have any problems or questions after your procedure. HOME CARE INSTRUCTIONS   It is normal to be sore for a few weeks following surgery. See your caregiver if your pain seems to be getting worse rather than better.   Only take over-the-counter or prescription medicines for pain, discomfort, or fever as directed by your caregiver. Avoid taking medicines that contain aspirin and ibuprofen because they increase the risk of bleeding.   Shower rather than bathe until instructed otherwise by your caregiver.   Change your bandages (dressings) as directed by your caregiver.   You may resume a normal diet and activities as directed by your caregiver.   Avoid lifting weight greater than 20 lb (9 kg) or participating in heavy exercise or contact sports for 10 days or as instructed by your caregiver.   Make an appointment to see your caregiver for stitch (suture) or staple removal.  SEEK MEDICAL CARE IF:   You have increased bleeding from your wound.   You have redness, swelling, or increasing pain from your wound or in your neck.   There is pus coming from your wound.   You have an oral temperature above 102 F (38.9 C).   There is a bad smell coming from the wound or dressing.   You develop lightheadedness or feel faint.   You develop numbness, tingling, or muscle spasms in your arms, hands, feet, or face.   You have difficulty swallowing.  SEEK IMMEDIATE MEDICAL CARE IF:   You develop a rash.   You have difficulty breathing.   You hear whistling noises that come from your chest.   You develop a cough that  becomes increasingly worse.   You develop any reaction or side effects to medicines given.   There is swelling in your neck.   You develop changes in speech or hoarseness, which is getting worse.  MAKE SURE YOU:   Understand these instructions.   Will watch your condition.   Will get help right away if you are not doing well or get worse.  Document Released: 03/12/2005 Document Revised: 08/12/2011 Document Reviewed: 10/30/2010 [employer] Patient Information 2012 [employer]. 

## 2011-11-22 ENCOUNTER — Encounter (HOSPITAL_COMMUNITY): Payer: Self-pay | Admitting: Otolaryngology

## 2011-11-23 ENCOUNTER — Other Ambulatory Visit: Payer: Self-pay | Admitting: Family Medicine

## 2011-11-25 ENCOUNTER — Ambulatory Visit (INDEPENDENT_AMBULATORY_CARE_PROVIDER_SITE_OTHER): Payer: Medicare Other | Admitting: Otolaryngology

## 2011-12-22 ENCOUNTER — Other Ambulatory Visit: Payer: Self-pay | Admitting: Family Medicine

## 2012-01-04 LAB — HEMOGLOBIN A1C
Hgb A1c MFr Bld: 5.6 % (ref <5.7)
Mean Plasma Glucose: 114 mg/dL (ref <117)

## 2012-01-10 ENCOUNTER — Ambulatory Visit (INDEPENDENT_AMBULATORY_CARE_PROVIDER_SITE_OTHER): Payer: Medicare Other | Admitting: Family Medicine

## 2012-01-10 ENCOUNTER — Encounter: Payer: Self-pay | Admitting: Family Medicine

## 2012-01-10 VITALS — BP 140/84 | HR 85 | Resp 16 | Ht 61.0 in | Wt 173.0 lb

## 2012-01-10 DIAGNOSIS — E785 Hyperlipidemia, unspecified: Secondary | ICD-10-CM

## 2012-01-10 DIAGNOSIS — I10 Essential (primary) hypertension: Secondary | ICD-10-CM

## 2012-01-10 DIAGNOSIS — K219 Gastro-esophageal reflux disease without esophagitis: Secondary | ICD-10-CM

## 2012-01-10 DIAGNOSIS — IMO0002 Reserved for concepts with insufficient information to code with codable children: Secondary | ICD-10-CM

## 2012-01-10 MED ORDER — POTASSIUM CHLORIDE ER 10 MEQ PO TBCR: 10.0000 meq | EXTENDED_RELEASE_TABLET | Freq: Two times a day (BID) | ORAL | Status: DC

## 2012-01-10 MED ORDER — HYDROCHLOROTHIAZIDE 12.5 MG PO CAPS: 12.5000 mg | ORAL_CAPSULE | Freq: Every day | ORAL | Status: DC

## 2012-01-10 NOTE — Assessment & Plan Note (Signed)
Deteriorated refer for epidural

## 2012-01-10 NOTE — Assessment & Plan Note (Signed)
Controlled, no change in medication  

## 2012-01-10 NOTE — Patient Instructions (Addendum)
F/u in 3 month  You are referred to pain clinic  Labs are good.  Fasting lipid and CMP in 3 month  New additional medication for blood pressure and potassium to be started , your blood pressure is slightly elevated.  Enjoy life each day!  Weight loss goal of 1 to 2 pounds per month

## 2012-01-10 NOTE — Assessment & Plan Note (Signed)
Hyperlipidemia:Low fat diet discussed and encouraged.  Updated labs in 3 month, continue current med

## 2012-01-10 NOTE — Assessment & Plan Note (Signed)
Uncontrolled additional med added 

## 2012-01-10 NOTE — Progress Notes (Signed)
  Subjective:    Patient ID: [name], female    DOB: May 09, 1934, 78 y.o.   MRN: [id number]  HPI The PT is here for follow up and re-evaluation of chronic medical conditions, medication management and review of any available recent lab and radiology data.  Preventive health is updated, specifically  Cancer screening and Immunization.   Questions or concerns regarding consultations or procedures which the PT has had in the interim are  Addressed.Recently saw cardiology and has 1 year follow up The PT denies any adverse reactions to current medications since the last visit.  C/o uncontrolled back pain requests referral for epidural. C/o tearful spells and episodes of depression still misses her parents, has work to do on relationships with her children who she state are jealous of each other still uncertain as far as marriage is concened    Review of Systems See HPI Denies recent fever or chills. Denies sinus pressure, nasal congestion, ear pain or sore throat. Denies chest congestion, productive cough or wheezing. Denies chest pains, palpitations and leg swelling Denies abdominal pain, nausea, vomiting,diarrhea or constipation.   Denies dysuria, frequency, hesitancy or incontinence. Denies headaches, seizures, numbness, or tingling.  Denies skin break down or rash.        Objective:   Physical Exam Patient alert and oriented and in no cardiopulmonary distress.  HEENT: No facial asymmetry, EOMI, no sinus tenderness,  oropharynx pink and moist.  Neck decreased ROM though adequate, no adenopathy.  Chest: Clear to auscultation bilaterally.  CVS: S1, S2 no murmurs, no S3.  ABD: Soft non tender. Bowel sounds normal.  Ext: No edema  QM:[id number]  ROM spine,adequate in  shoulders, hips and knees.  Skin: Intact, no ulcerations or rash noted.  Psych: Good eye contact, normal affect. Memory intact not anxious or depressed appearing.Tearful at times  CNS: CN 2-12 intact,  power,  normal throughout.        Assessment & Plan:

## 2012-01-11 ENCOUNTER — Other Ambulatory Visit: Payer: Self-pay | Admitting: Family Medicine

## 2012-01-11 DIAGNOSIS — M549 Dorsalgia, unspecified: Secondary | ICD-10-CM

## 2012-01-18 ENCOUNTER — Other Ambulatory Visit: Payer: Medicare Other

## 2012-01-28 ENCOUNTER — Other Ambulatory Visit: Payer: Self-pay | Admitting: Family Medicine

## 2012-02-01 ENCOUNTER — Other Ambulatory Visit: Payer: Self-pay | Admitting: Family Medicine

## 2012-02-01 ENCOUNTER — Ambulatory Visit
Admission: RE | Admit: 2012-02-01 | Discharge: 2012-02-01 | Disposition: A | Discharge status: Home / Self Care | Payer: Medicare Other | Source: Ambulatory Visit | Attending: Family Medicine | Admitting: Family Medicine

## 2012-02-01 DIAGNOSIS — M549 Dorsalgia, unspecified: Secondary | ICD-10-CM

## 2012-02-01 MED ORDER — METHYLPREDNISOLONE ACETATE 40 MG/ML INJ SUSP (RADIOLOG
120.0000 mg | Freq: Once | INTRAMUSCULAR | Status: AC
  Administered 2012-02-01: 120 mg via EPIDURAL

## 2012-02-01 MED ORDER — IOHEXOL 180 MG/ML  SOLN
1.0000 mL | Freq: Once | INTRAMUSCULAR | Status: AC | PRN
  Administered 2012-02-01: 1 mL via EPIDURAL

## 2012-02-09 ENCOUNTER — Telehealth: Payer: Self-pay | Admitting: Family Medicine

## 2012-02-16 ENCOUNTER — Other Ambulatory Visit: Payer: Self-pay | Admitting: Family Medicine

## 2012-02-25 ENCOUNTER — Telehealth (INDEPENDENT_AMBULATORY_CARE_PROVIDER_SITE_OTHER): Payer: Self-pay | Admitting: *Deleted

## 2012-02-25 NOTE — Telephone Encounter (Signed)
Please advise 

## 2012-02-25 NOTE — Telephone Encounter (Signed)
Peri would like to know if Dr. Lodema Hong would please call her in the Volta[facility] jell for her back.

## 2012-02-28 ENCOUNTER — Other Ambulatory Visit: Payer: Self-pay | Admitting: Family Medicine

## 2012-02-28 MED ORDER — DICLOFENAC SODIUM 1 % TD GEL: 1.0000 "application " | Freq: Four times a day (QID) | TRANSDERMAL | Status: DC

## 2012-02-28 NOTE — Telephone Encounter (Signed)
Prescri[ption sent in please let her know

## 2012-03-07 ENCOUNTER — Ambulatory Visit (INDEPENDENT_AMBULATORY_CARE_PROVIDER_SITE_OTHER): Payer: Medicare Other | Admitting: Family Medicine

## 2012-03-07 ENCOUNTER — Encounter: Payer: Self-pay | Admitting: Family Medicine

## 2012-03-07 VITALS — BP 160/98 | HR 96 | Resp 18 | Ht 61.0 in | Wt 173.1 lb

## 2012-03-07 DIAGNOSIS — E785 Hyperlipidemia, unspecified: Secondary | ICD-10-CM

## 2012-03-07 DIAGNOSIS — I10 Essential (primary) hypertension: Secondary | ICD-10-CM

## 2012-03-07 DIAGNOSIS — IMO0002 Reserved for concepts with insufficient information to code with codable children: Secondary | ICD-10-CM

## 2012-03-07 DIAGNOSIS — L309 Dermatitis, unspecified: Secondary | ICD-10-CM

## 2012-03-07 DIAGNOSIS — L0390 Cellulitis, unspecified: Secondary | ICD-10-CM | POA: Insufficient documentation

## 2012-03-07 DIAGNOSIS — L0291 Cutaneous abscess, unspecified: Secondary | ICD-10-CM

## 2012-03-07 DIAGNOSIS — L259 Unspecified contact dermatitis, unspecified cause: Secondary | ICD-10-CM

## 2012-03-07 MED ORDER — SULFAMETHOXAZOLE-TRIMETHOPRIM 800-160 MG PO TABS: 1.0000 | ORAL_TABLET | Freq: Two times a day (BID) | ORAL | Status: AC

## 2012-03-07 MED ORDER — PREDNISONE (PAK) 5 MG PO TABS: 5.0000 mg | ORAL_TABLET | ORAL | Status: DC

## 2012-03-07 MED ORDER — AMLODIPINE BESYLATE 10 MG PO TABS: ORAL_TABLET | ORAL | Status: DC

## 2012-03-07 MED ORDER — METHYLPREDNISOLONE ACETATE 40 MG/ML IJ SUSP
40.0000 mg | Freq: Once | INTRAMUSCULAR | Status: AC
  Administered 2012-03-07: 40 mg via INTRAMUSCULAR

## 2012-03-07 NOTE — Patient Instructions (Addendum)
F/u as before.  You are being treated for allergic skin reaction. Depo medrol 40 mg will be administered in the office, and also prednisone sent in.  Antibiotic course for 5 days also sent since you seem to have slight infection on the right arm  Blood pressure is high, take TWO 5mg  amlodipine tablets daily till done. New med is amlodipine 10mg  take ONE daily

## 2012-03-07 NOTE — Assessment & Plan Note (Signed)
Uncontrolled, inc amlodipine

## 2012-03-07 NOTE — Progress Notes (Signed)
  Subjective:    Patient ID: [name], female    DOB: [birth year], 78 y.o.   MRN: [id number]  HPI 1 week h/o pruritic rash on right upper arm, then left also inner right leg, no drainage or fever, itches, topicals are not helping and the initial lesion appears more red with slight skin breakdown. No wheezing or shortness of breath. Prior to this she has been well   Review of Systems See HPI Denies recent fever or chills. Denies sinus pressure, nasal congestion, ear pain or sore throat. Denies chest congestion, productive cough or wheezing. Denies chest pains, palpitations and leg swelling Denies abdominal pain, nausea, vomiting,diarrhea or constipation.   Denies dysuria, frequency, hesitancy or incontinence. Chronic  joint pain, swelling and limitation in mobility. Denies headaches, seizures, numbness, or tingling. Denies depression, anxiety or insomnia.       Objective:   Physical Exam Patient alert and oriented and in no cardiopulmonary distress.  HEENT: No facial asymmetry, EOMI, no sinus tenderness,  oropharynx pink and moist.  Neck supple no adenopathy.  Chest: Clear to auscultation bilaterally.  CVS: S1, S2 no murmurs, no S3.  ABD: Soft non tender. Bowel sounds normal.  Ext: No edema  MS: Adequate ROM spine, shoulders, hips and knees.  Skin: erythematous maculo papular rash on inner thigh, arms and under left arm pit  Psych: Good eye contact, normal affect. Memory intact not anxious or depressed appearing.  CNS: CN 2-12 intact, power, tone and sensation normal throughout.        Assessment & Plan:

## 2012-03-13 ENCOUNTER — Telehealth: Payer: Self-pay | Admitting: Family Medicine

## 2012-03-14 NOTE — Telephone Encounter (Signed)
She finished the prednisone and the rash had cleared up but now she has the rash on the left side of her underarm and it hurts to put her arm down. Needs something called in

## 2012-03-14 NOTE — Telephone Encounter (Signed)
Pt needs oV sounds like a different problem she can be worked in tomorrow pls give her an appt with me

## 2012-03-15 ENCOUNTER — Encounter: Payer: Self-pay | Admitting: Family Medicine

## 2012-03-15 ENCOUNTER — Ambulatory Visit (INDEPENDENT_AMBULATORY_CARE_PROVIDER_SITE_OTHER): Payer: Medicare Other | Admitting: Family Medicine

## 2012-03-15 VITALS — BP 140/84 | HR 61 | Resp 16 | Ht 61.0 in | Wt 171.8 lb

## 2012-03-15 DIAGNOSIS — I10 Essential (primary) hypertension: Secondary | ICD-10-CM

## 2012-03-15 DIAGNOSIS — L309 Dermatitis, unspecified: Secondary | ICD-10-CM

## 2012-03-15 DIAGNOSIS — L259 Unspecified contact dermatitis, unspecified cause: Secondary | ICD-10-CM

## 2012-03-15 MED ORDER — HYDROXYZINE HCL 10 MG PO TABS: 10.0000 mg | ORAL_TABLET | Freq: Three times a day (TID) | ORAL | Status: AC | PRN

## 2012-03-15 MED ORDER — TERBINAFINE HCL 250 MG PO TABS: 250.0000 mg | ORAL_TABLET | Freq: Every day | ORAL | Status: DC

## 2012-03-15 MED ORDER — FLUCONAZOLE 150 MG PO TABS: ORAL_TABLET | ORAL | Status: AC

## 2012-03-15 NOTE — Assessment & Plan Note (Signed)
Deteriorated in some respects.  Hydroxyzine for itch and antifungal and fluconazole for under arm rash

## 2012-03-15 NOTE — Assessment & Plan Note (Signed)
Allergic skin reaction, prednisone dose pack and depo medrol in office

## 2012-03-15 NOTE — Assessment & Plan Note (Signed)
Improved, no med change 

## 2012-03-15 NOTE — Assessment & Plan Note (Signed)
Hyperlipidemia:Low fat diet discussed and encouraged.  Controlled, no change in medication   

## 2012-03-15 NOTE — Progress Notes (Signed)
  Subjective:    Patient ID: [name], female    DOB: December 26, 1933, 78 y.o.   MRN: [id number]  HPI Pt returns with c/o worsening rash, primarily involving left arm pit, very itch, has pimples in the area also. No purulent drainage ,m fever or chills. Wants to know if this is shingles or contagious , my response is no, to both. She has had antibiotic and steroids, concerned about hyperpigmentation in area where rash is resolving   Review of Systems See HPI Denies recent fever or chills. Denies sinus pressure, nasal congestion, ear pain or sore throat. Denies chest congestion, productive cough or wheezing. Denies chest pains, palpitations and leg swelling Denies abdominal pain, nausea, vomiting,diarrhea or constipation.   Denies dysuria, frequency, hesitancy or incontinence. chronic joint pain, swelling and limitation in mobility. Denies headaches, seizures, numbness, or tingling. Denies depression, anxiety or insomnia.       Objective:   Physical Exam Patient alert and oriented and in no cardiopulmonary distress.  HEENT: No facial asymmetry, EOMI, no sinus tenderness,  oropharynx pink and moist.  Neck supple no adenopathy.  Chest: Clear to auscultation bilaterally.  CVS: S1, S2 no murmurs, no S3.  ABD: Soft non tender. Bowel sounds normal.  Ext: No edema  MS: Adequate ROM spine, shoulders, hips and knees.  Skin:erythematous maculo papular rash under left armpit, with pustular looking areas which have dried up  Psych: Good eye contact, normal affect. Memory intact not anxious or depressed appearing.  CNS: CN 2-12 intact, power, tone and sensation normal throughout.        Assessment & Plan:

## 2012-03-15 NOTE — Assessment & Plan Note (Signed)
Controlled, no change in medication  

## 2012-03-15 NOTE — Patient Instructions (Addendum)
F/u as before.  Three tablets are prescribed, on for fungal skin infection,terbinafine, one for yeast  Infection, fluconazole, and one for itch, hydroxyzine. The hydroxyzine will make you sleepy , so do not take if going out, and limit to twice daily , not three times daily, or just at bedtime  You have an appointment scheduled with drmatology

## 2012-03-15 NOTE — Assessment & Plan Note (Signed)
Antibiotic course prescribed 

## 2012-03-24 ENCOUNTER — Other Ambulatory Visit: Payer: Self-pay | Admitting: Family Medicine

## 2012-04-03 LAB — LIPID PANEL
Cholesterol: 158 mg/dL (ref 0–200)
HDL: 48 mg/dL (ref >39)
LDL Cholesterol: 85 mg/dL (ref 0–99)
Total CHOL/HDL Ratio: 3.3 Ratio
Triglycerides: 125 mg/dL (ref <150)
VLDL: 25 mg/dL (ref 0–40)

## 2012-04-03 LAB — COMPLETE METABOLIC PANEL WITH GFR
ALT: 24 U/L (ref 0–35)
AST: 29 U/L (ref 0–37)
Albumin: 4.3 g/dL (ref 3.5–5.2)
Alkaline Phosphatase: 62 U/L (ref 39–117)
BUN: 14 mg/dL (ref 6–23)
CO2: 31 mEq/L (ref 19–32)
Calcium: 9.7 mg/dL (ref 8.4–10.5)
Chloride: 101 mEq/L (ref 96–112)
Creat: 1.06 mg/dL (ref 0.50–1.10)
GFR, Est African American: 59 mL/min — ABNORMAL LOW
GFR, Est Non African American: 51 mL/min — ABNORMAL LOW
Glucose, Bld: 82 mg/dL (ref 70–99)
Potassium: 4.4 mEq/L (ref 3.5–5.3)
Sodium: 140 mEq/L (ref 135–145)
Total Bilirubin: 0.5 mg/dL (ref 0.3–1.2)
Total Protein: 7 g/dL (ref 6.0–8.3)

## 2012-04-12 ENCOUNTER — Ambulatory Visit (INDEPENDENT_AMBULATORY_CARE_PROVIDER_SITE_OTHER): Payer: Medicare Other | Admitting: Family Medicine

## 2012-04-12 ENCOUNTER — Telehealth: Payer: Self-pay | Admitting: Family Medicine

## 2012-04-12 ENCOUNTER — Encounter: Payer: Self-pay | Admitting: Family Medicine

## 2012-04-12 VITALS — BP 122/76 | HR 66 | Resp 18 | Wt 174.1 lb

## 2012-04-12 DIAGNOSIS — I10 Essential (primary) hypertension: Secondary | ICD-10-CM

## 2012-04-12 DIAGNOSIS — L259 Unspecified contact dermatitis, unspecified cause: Secondary | ICD-10-CM

## 2012-04-12 DIAGNOSIS — R7303 Prediabetes: Secondary | ICD-10-CM

## 2012-04-12 DIAGNOSIS — Z13820 Encounter for screening for osteoporosis: Secondary | ICD-10-CM

## 2012-04-12 DIAGNOSIS — IMO0002 Reserved for concepts with insufficient information to code with codable children: Secondary | ICD-10-CM

## 2012-04-12 DIAGNOSIS — R7309 Other abnormal glucose: Secondary | ICD-10-CM

## 2012-04-12 DIAGNOSIS — L309 Dermatitis, unspecified: Secondary | ICD-10-CM

## 2012-04-12 DIAGNOSIS — E785 Hyperlipidemia, unspecified: Secondary | ICD-10-CM

## 2012-04-12 DIAGNOSIS — R946 Abnormal results of thyroid function studies: Secondary | ICD-10-CM

## 2012-04-12 MED ORDER — METAXALONE 800 MG PO TABS: 800.0000 mg | ORAL_TABLET | Freq: Three times a day (TID) | ORAL | Status: DC

## 2012-04-12 NOTE — Patient Instructions (Addendum)
Annual wellness in 4 month with rectal exam, please call if you need me before  Labs and blood pressure are excellent.  Please work on lifestyle change to facilitate weight loss, so you do not become diabetic.  HBA1C, tSH, cbc non fast in 4 month  You are referred for a bone density scan

## 2012-04-16 NOTE — Assessment & Plan Note (Signed)
Hyperlipidemia:Low fat diet discussed and encouraged.  Controlled, no change in medication   

## 2012-04-16 NOTE — Assessment & Plan Note (Signed)
Stable and controlled on current regime 

## 2012-04-16 NOTE — Assessment & Plan Note (Signed)
Improved. Patient educated about the importance of limiting  Carbohydrate intake , the need to commit to daily physical activity for a minimum of 30 minutes , and to commit weight loss. The fact that changes in all these areas will reduce or eliminate all together the development of diabetes is stressed.   Will rept lab in 3 to 6 month

## 2012-04-16 NOTE — Progress Notes (Signed)
  Subjective:    Patient ID: [name], female    DOB: 08-Apr-1934, 78 y.o.   MRN: [id number]  HPI  The PT is here for follow up and re-evaluation of chronic medical conditions, medication management and review of any available recent lab and radiology data.  Preventive health is updated, specifically  Cancer screening and Immunization.   Questions or concerns regarding consultations or procedures which the PT has had in the interim are  Addressed.Dermatology re evaluated the rash which she had, repeated the steroid course with complete clearing, hence states she was told it was due to poison oak The PT denies any adverse reactions to current medications since the last visit.  There are no new concerns.  There are no specific complaints      Review of Systems See HPI Denies recent fever or chills. Denies sinus pressure, nasal congestion, ear pain or sore throat. Denies chest congestion, productive cough or wheezing. Denies chest pains, palpitations and leg swelling Denies abdominal pain, nausea, vomiting,diarrhea or constipation.   Denies dysuria, frequency, hesitancy or incontinence. Chronic back  pain,  and limitation in mobility. Denies headaches, seizures, numbness, or tingling. Denies depression, anxiety or insomnia. Denies skin break down or rash.        Objective:   Physical Exam  Patient alert and oriented and in no cardiopulmonary distress.  HEENT: No facial asymmetry, EOMI, no sinus tenderness,  oropharynx pink and moist.  Neck supple no adenopathy.  Chest: Clear to auscultation bilaterally.  CVS: S1, S2 no murmurs, no S3.  ABD: Soft non tender. Bowel sounds normal.  Ext: No edema  MS: decreased  ROM spine,adequate in  shoulders, hips and knees.  Skin: Intact, no ulcerations or rash noted.  Psych: Good eye contact, normal affect. Memory intact not anxious or depressed appearing.  CNS: CN 2-12 intact, power, tone and sensation normal  throughout.       Assessment & Plan:

## 2012-04-16 NOTE — Assessment & Plan Note (Signed)
Cleared fully, reports that dermatology assed the rash as entirely due to poison oak

## 2012-04-16 NOTE — Assessment & Plan Note (Signed)
Controlled, no change in medication DASH diet and commitment to daily physical activity for a minimum of 30 minutes discussed and encouraged, as a part of hypertension management. The importance of attaining a healthy weight is also discussed.  

## 2012-04-19 ENCOUNTER — Ambulatory Visit (HOSPITAL_COMMUNITY)
Admission: RE | Admit: 2012-04-19 | Discharge: 2012-04-19 | Disposition: A | Discharge status: Home / Self Care | Payer: Medicare Other | Source: Ambulatory Visit | Attending: Family Medicine | Admitting: Family Medicine

## 2012-04-19 DIAGNOSIS — Z13820 Encounter for screening for osteoporosis: Secondary | ICD-10-CM

## 2012-04-19 DIAGNOSIS — M899 Disorder of bone, unspecified: Secondary | ICD-10-CM | POA: Insufficient documentation

## 2012-05-18 ENCOUNTER — Ambulatory Visit (INDEPENDENT_AMBULATORY_CARE_PROVIDER_SITE_OTHER): Payer: Medicare Other

## 2012-05-18 DIAGNOSIS — Z23 Encounter for immunization: Secondary | ICD-10-CM

## 2012-05-26 ENCOUNTER — Other Ambulatory Visit: Payer: Self-pay | Admitting: Family Medicine

## 2012-06-25 ENCOUNTER — Other Ambulatory Visit: Payer: Self-pay | Admitting: Family Medicine

## 2012-07-19 ENCOUNTER — Telehealth: Payer: Self-pay | Admitting: Family Medicine

## 2012-07-19 DIAGNOSIS — I10 Essential (primary) hypertension: Secondary | ICD-10-CM

## 2012-07-19 DIAGNOSIS — R5381 Other malaise: Secondary | ICD-10-CM

## 2012-07-19 DIAGNOSIS — E785 Hyperlipidemia, unspecified: Secondary | ICD-10-CM

## 2012-07-19 DIAGNOSIS — R7301 Impaired fasting glucose: Secondary | ICD-10-CM

## 2012-07-19 DIAGNOSIS — D509 Iron deficiency anemia, unspecified: Secondary | ICD-10-CM

## 2012-07-19 NOTE — Telephone Encounter (Signed)
Patient aware.

## 2012-07-19 NOTE — Telephone Encounter (Signed)
States he has been doing the salt water gargles but her throat was still scratchy and hoarse. I advised she continue the salt water gargles and I would see if there was anything else you recommended

## 2012-07-19 NOTE — Telephone Encounter (Signed)
Spoke with patient and she believes that she needs an antibiotic.  Please work her in to the schedule.

## 2012-07-19 NOTE — Telephone Encounter (Signed)
I agree with advice given, if she has excessive nasal drainage, OK for her to take one sudafed once daily for 3 to 4 days , this will reduce the drainage and improve the scratchy throat

## 2012-07-19 NOTE — Telephone Encounter (Signed)
No feevr or chills I agree with what she is doing if having a lot of post nasal drainage one sudafed one daily for 3 to 5 days will reduce the drainage

## 2012-07-20 ENCOUNTER — Ambulatory Visit (INDEPENDENT_AMBULATORY_CARE_PROVIDER_SITE_OTHER): Payer: Medicare Other | Admitting: Family Medicine

## 2012-07-20 VITALS — BP 140/90 | HR 84 | Temp 98.2°F | Resp 18 | Ht 61.0 in | Wt 177.1 lb

## 2012-07-20 DIAGNOSIS — E785 Hyperlipidemia, unspecified: Secondary | ICD-10-CM

## 2012-07-20 DIAGNOSIS — L309 Dermatitis, unspecified: Secondary | ICD-10-CM

## 2012-07-20 DIAGNOSIS — L259 Unspecified contact dermatitis, unspecified cause: Secondary | ICD-10-CM

## 2012-07-20 DIAGNOSIS — I10 Essential (primary) hypertension: Secondary | ICD-10-CM

## 2012-07-20 DIAGNOSIS — J029 Acute pharyngitis, unspecified: Secondary | ICD-10-CM

## 2012-07-20 MED ORDER — PREDNISONE (PAK) 5 MG PO TABS: 5.0000 mg | ORAL_TABLET | ORAL | Status: DC

## 2012-07-20 MED ORDER — AZITHROMYCIN 250 MG PO TABS: ORAL_TABLET | ORAL | Status: AC

## 2012-07-20 MED ORDER — FIRST-DUKES MOUTHWASH MT SUSP: OROMUCOSAL | Status: AC

## 2012-07-20 NOTE — Assessment & Plan Note (Signed)
Pruritic left breast rash x 5 days

## 2012-07-20 NOTE — Patient Instructions (Addendum)
F/u as before.Call if no improvement or worsens in the next week  You are being treated for acute pharyngitis  Throat swab in the office is negative, and medication is sent to your pharmacy.  Medication is sent in for the left breat rash which looks like an allergic reaction  Stop sudafed today please

## 2012-07-20 NOTE — Progress Notes (Signed)
  Subjective:    Patient ID: [name], female    DOB: June 09, 1934, 78 y.o.   MRN: [id number]  HPI  5 day h/o scratchy throat like razor blades, also thick ropy sputum, no chills or fever noted, feels as though sputum is sticking in her throat  Pruritic rash on left breast and under the breast x 4 days, no drainage  Review of Systems See HPI  Denies chest pains, palpitations and leg swelling Denies abdominal pain, nausea, vomiting,diarrhea or constipation.   Denies dysuria, frequency, hesitancy or incontinence. Denies joint pain, swelling and limitation in mobility. Denies headaches, seizures, numbness, or tingling. Denies depression, anxiety or insomnia.        Objective:   Physical Exam  Patient alert and oriented and in no cardiopulmonary distress.  HEENT: No facial asymmetry, EOMI, no sinus tenderness,  oropharynx erythematous, no exudate.  Neck supple anterior cervical  adenopathy.  Chest: Clear to auscultation bilaterally.  CVS: S1, S2 no murmurs, no S3.  ABD: Soft non tender. Bowel sounds normal.  Ext: No edema  MS: Adequate ROM spine, shoulders, hips and knees.  Skin: Intact, erythematous maculopapular rash o medial aspect left breast, lower quadrant  Psych: Good eye contact, normal affect. Memory intact not anxious or depressed appearing.  CNS: CN 2-12 intact, power, tone and sensation normal throughout.      Assessment & Plan:

## 2012-07-20 NOTE — Telephone Encounter (Signed)
Pt has appt at 07/20/2012 3:30. Pt is aware of appt

## 2012-07-23 ENCOUNTER — Encounter: Payer: Self-pay | Admitting: Family Medicine

## 2012-07-23 NOTE — Assessment & Plan Note (Signed)
Controlled, no change in medication Hyperlipidemia:Low fat diet discussed and encouraged.  \ 

## 2012-07-23 NOTE — Assessment & Plan Note (Signed)
Acute symptoms unresponsive to OTC meds and worsening, though rapid strep is negative will treat with antibiotics

## 2012-07-23 NOTE — Assessment & Plan Note (Signed)
Uncontrolled, elevation may be due to recent sudafed use, pt to discontinue same DASH diet and commitment to daily physical activity for a minimum of 30 minutes discussed and encouraged, as a part of hypertension management. The importance of attaining a healthy weight is also discussed. No med change at this time

## 2012-07-25 ENCOUNTER — Other Ambulatory Visit: Payer: Self-pay | Admitting: Family Medicine

## 2012-08-11 ENCOUNTER — Other Ambulatory Visit: Payer: Self-pay | Admitting: Family Medicine

## 2012-08-11 LAB — CBC WITH DIFFERENTIAL/PLATELET
Basophils Absolute: 0 10*3/uL (ref 0.0–0.1)
Basophils Relative: 1 % (ref 0–1)
Eosinophils Absolute: 0.2 10*3/uL (ref 0.0–0.7)
Eosinophils Relative: 5 % (ref 0–5)
HCT: 39.4 % (ref 36.0–46.0)
Hemoglobin: 12.5 g/dL (ref 12.0–15.0)
Lymphocytes Relative: 56 % — ABNORMAL HIGH (ref 12–46)
Lymphs Abs: 2 10*3/uL (ref 0.7–4.0)
MCH: 28.4 pg (ref 26.0–34.0)
MCHC: 31.7 g/dL (ref 30.0–36.0)
MCV: 89.5 fL (ref 78.0–100.0)
Monocytes Absolute: 0.4 10*3/uL (ref 0.1–1.0)
Monocytes Relative: 10 % (ref 3–12)
Neutro Abs: 1 10*3/uL — ABNORMAL LOW (ref 1.7–7.7)
Neutrophils Relative %: 28 % — ABNORMAL LOW (ref 43–77)
Platelets: 240 10*3/uL (ref 150–400)
RBC: 4.4 MIL/uL (ref 3.87–5.11)
RDW: 13.8 % (ref 11.5–15.5)
WBC: 3.6 10*3/uL — ABNORMAL LOW (ref 4.0–10.5)

## 2012-08-11 LAB — HEMOGLOBIN A1C
Hgb A1c MFr Bld: 6.1 % — ABNORMAL HIGH (ref <5.7)
Mean Plasma Glucose: 128 mg/dL — ABNORMAL HIGH (ref <117)

## 2012-08-11 NOTE — Addendum Note (Signed)
Addended by: Abner Greenspan on: 08/11/2012 11:08 AM   Modules accepted: Orders

## 2012-08-12 LAB — TSH: TSH: 8.678 u[IU]/mL — ABNORMAL HIGH (ref 0.350–4.500)

## 2012-08-14 ENCOUNTER — Encounter: Payer: Self-pay | Admitting: Family Medicine

## 2012-08-14 ENCOUNTER — Ambulatory Visit (INDEPENDENT_AMBULATORY_CARE_PROVIDER_SITE_OTHER): Payer: Medicare Other | Admitting: Family Medicine

## 2012-08-14 VITALS — BP 116/76 | HR 73 | Resp 18 | Ht 61.0 in | Wt 177.0 lb

## 2012-08-14 DIAGNOSIS — Z0000 Encounter for general adult medical examination without abnormal findings: Secondary | ICD-10-CM

## 2012-08-14 DIAGNOSIS — I10 Essential (primary) hypertension: Secondary | ICD-10-CM

## 2012-08-14 DIAGNOSIS — R7309 Other abnormal glucose: Secondary | ICD-10-CM

## 2012-08-14 DIAGNOSIS — D72819 Decreased white blood cell count, unspecified: Secondary | ICD-10-CM

## 2012-08-14 DIAGNOSIS — E785 Hyperlipidemia, unspecified: Secondary | ICD-10-CM

## 2012-08-14 DIAGNOSIS — E039 Hypothyroidism, unspecified: Secondary | ICD-10-CM

## 2012-08-14 DIAGNOSIS — R7303 Prediabetes: Secondary | ICD-10-CM

## 2012-08-14 DIAGNOSIS — Z1211 Encounter for screening for malignant neoplasm of colon: Secondary | ICD-10-CM

## 2012-08-14 LAB — POC HEMOCCULT BLD/STL (OFFICE/1-CARD/DIAGNOSTIC): Fecal Occult Blood, POC: NEGATIVE

## 2012-08-14 LAB — T3, FREE: T3, Free: 2.7 pg/mL (ref 2.3–4.2)

## 2012-08-14 LAB — T4, FREE: Free T4: 0.83 ng/dL (ref 0.80–1.80)

## 2012-08-14 MED ORDER — LEVOTHYROXINE SODIUM 25 MCG PO TABS: 25.0000 ug | ORAL_TABLET | Freq: Every day | ORAL | Status: DC

## 2012-08-14 MED ORDER — METAXALONE 800 MG PO TABS: 800.0000 mg | ORAL_TABLET | Freq: Three times a day (TID) | ORAL | Status: DC

## 2012-08-14 NOTE — Progress Notes (Signed)
Subjective:    Patient ID: [name], female    DOB: 03/04/1934, 78 y.o.   MRN: [id number]  HPI Preventive Screening-Counseling & Management   Patient present here today for a Medicare annual wellness visit.   Current Problems (verified)   Medications Prior to Visit Allergies (verified)   PAST HISTORY  Family History Only child both parents lived past  74. 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 No  Over the past two weeks, have you felt little interest or pleasure in doing things? No  Have you lost interest or pleasure in daily life? No  Do you often feel hopeless? No  Do you cry easily over simple problems? No   Activities of Daily Living  In your present state of health, do you have any difficulty performing the following activities?  Driving?: No Managing money?: No Feeding yourself?:No Getting from bed to chair?:yes back pain Climbing a flight of stairs?:yes Preparing food and eating?:No Bathing or showering?:No Getting dressed?:No Getting to the toilet?:No Using the toilet?:No Moving around from place to place?: No  Fall Risk Assessment In the past year have you fallen or had a near fall?:No Are you currently taking any medications that make you dizziness?:No   Hearing Difficulties: No Do you often ask people to speak up or repeat themselves?:No Do you experience ringing or noises  in your ears?:No Do you have difficulty understanding soft or whispered voices?:No  Cognitive Testing  Alert? Yes Normal Appearance?Yes  Oriented to person? Yes Place? Yes  Time? Yes  Displays appropriate judgment?Yes  Can read the correct time from a watch face? yes Are you having problems remembering things?No  Advanced Directives have been discussed with the patient?Yes    List the Names of Other Physician/Practitioners you currently use: Dr Suszanne Conners, [facility] cardiology, dr Eulah Pont   I  Assessment:    Annual Wellness Exam   Plan:    During the course of the visit the patient was educated and counseled about appropriate screening and preventive services including:  A healthy diet is rich in fruit, vegetables and whole grains. Poultry fish, nuts and beans are a healthy choice for protein rather then red meat. A low sodium diet and drinking 64 ounces of water daily is generally recommended. Oils and sweet should be limited. Carbohydrates especially for those who are diabetic or overweight, should be limited to 30-45 gram per meal. It is important to eat on a regular schedule, at least 3 times daily. Snacks should be primarily fruits, vegetables or nuts. It is important that you exercise regularly at least 30 minutes 5 times a week. If you develop chest pain, have severe difficulty breathing, or feel very tired, stop exercising immediately and seek medical attention  Immunization reviewed and updated. Cancer screening reviewed and updated    Patient Instructions (the written plan) was given to the patient.  Medicare Attestation  I have personally reviewed:  The patient's medical  and social history  Their use of alcohol, tobacco or illicit drugs  Their current medications and supplements  The patient's functional ability including ADLs,fall risks, home safety risks, cognitive, and hearing and visual impairment  Diet and physical activities  Evidence for depression or mood  disorders  The patient's weight, height, BMI, and visual acuity have been recorded in the chart. I have made referrals, counseling, and provided education to the patient based on review of the above and I have provided the patient with a written personalized care plan for preventive services.      Review of Systems     Objective:   Physical Exam        Assessment & Plan:

## 2012-08-14 NOTE — Patient Instructions (Addendum)
F/u in 4 month  CBC and diff end of February   Please cut back on sweet tea, your blood sugar has risen.  Discuss end of life issues with your children, you will get a copy af a form to look at. Please commit to daily exercise for improved health  Plan eye exam in the next 3 to 4 month  You may need to see hematologist if blood work in February remains abnormal  Fasting lipid and cmp in 4 month and hBa1c

## 2012-08-24 ENCOUNTER — Emergency Department (HOSPITAL_COMMUNITY)
Admission: EM | Admit: 2012-08-24 | Discharge: 2012-08-24 | Disposition: A | Discharge status: Home / Self Care | Payer: Medicare Other | Attending: Emergency Medicine | Admitting: Emergency Medicine

## 2012-08-24 ENCOUNTER — Encounter (HOSPITAL_COMMUNITY): Payer: Self-pay | Admitting: *Deleted

## 2012-08-24 ENCOUNTER — Emergency Department (HOSPITAL_COMMUNITY): Payer: Medicare Other

## 2012-08-24 DIAGNOSIS — Z90711 Acquired absence of uterus with remaining cervical stump: Secondary | ICD-10-CM | POA: Insufficient documentation

## 2012-08-24 DIAGNOSIS — G4730 Sleep apnea, unspecified: Secondary | ICD-10-CM | POA: Insufficient documentation

## 2012-08-24 DIAGNOSIS — G5600 Carpal tunnel syndrome, unspecified upper limb: Secondary | ICD-10-CM | POA: Insufficient documentation

## 2012-08-24 DIAGNOSIS — E669 Obesity, unspecified: Secondary | ICD-10-CM | POA: Insufficient documentation

## 2012-08-24 DIAGNOSIS — I2510 Atherosclerotic heart disease of native coronary artery without angina pectoris: Secondary | ICD-10-CM | POA: Insufficient documentation

## 2012-08-24 DIAGNOSIS — R0602 Shortness of breath: Secondary | ICD-10-CM | POA: Insufficient documentation

## 2012-08-24 DIAGNOSIS — M1990 Unspecified osteoarthritis, unspecified site: Secondary | ICD-10-CM | POA: Insufficient documentation

## 2012-08-24 DIAGNOSIS — Z8701 Personal history of pneumonia (recurrent): Secondary | ICD-10-CM | POA: Insufficient documentation

## 2012-08-24 DIAGNOSIS — M25569 Pain in unspecified knee: Secondary | ICD-10-CM | POA: Insufficient documentation

## 2012-08-24 DIAGNOSIS — I10 Essential (primary) hypertension: Secondary | ICD-10-CM | POA: Insufficient documentation

## 2012-08-24 DIAGNOSIS — E785 Hyperlipidemia, unspecified: Secondary | ICD-10-CM | POA: Insufficient documentation

## 2012-08-24 DIAGNOSIS — Z87891 Personal history of nicotine dependence: Secondary | ICD-10-CM | POA: Insufficient documentation

## 2012-08-24 DIAGNOSIS — Z9089 Acquired absence of other organs: Secondary | ICD-10-CM | POA: Insufficient documentation

## 2012-08-24 DIAGNOSIS — Z79899 Other long term (current) drug therapy: Secondary | ICD-10-CM | POA: Insufficient documentation

## 2012-08-24 DIAGNOSIS — W19XXXA Unspecified fall, initial encounter: Secondary | ICD-10-CM | POA: Insufficient documentation

## 2012-08-24 DIAGNOSIS — K219 Gastro-esophageal reflux disease without esophagitis: Secondary | ICD-10-CM | POA: Insufficient documentation

## 2012-08-24 DIAGNOSIS — IMO0002 Reserved for concepts with insufficient information to code with codable children: Secondary | ICD-10-CM | POA: Insufficient documentation

## 2012-08-24 DIAGNOSIS — Y929 Unspecified place or not applicable: Secondary | ICD-10-CM | POA: Insufficient documentation

## 2012-08-24 DIAGNOSIS — Z7982 Long term (current) use of aspirin: Secondary | ICD-10-CM | POA: Insufficient documentation

## 2012-08-24 DIAGNOSIS — S20219A Contusion of unspecified front wall of thorax, initial encounter: Secondary | ICD-10-CM

## 2012-08-24 DIAGNOSIS — Z8719 Personal history of other diseases of the digestive system: Secondary | ICD-10-CM | POA: Insufficient documentation

## 2012-08-24 DIAGNOSIS — E0789 Other specified disorders of thyroid: Secondary | ICD-10-CM | POA: Insufficient documentation

## 2012-08-24 DIAGNOSIS — Z9889 Other specified postprocedural states: Secondary | ICD-10-CM | POA: Insufficient documentation

## 2012-08-24 DIAGNOSIS — Y9301 Activity, walking, marching and hiking: Secondary | ICD-10-CM | POA: Insufficient documentation

## 2012-08-24 DIAGNOSIS — D649 Anemia, unspecified: Secondary | ICD-10-CM | POA: Insufficient documentation

## 2012-08-24 DIAGNOSIS — R351 Nocturia: Secondary | ICD-10-CM | POA: Insufficient documentation

## 2012-08-24 DIAGNOSIS — Z86718 Personal history of other venous thrombosis and embolism: Secondary | ICD-10-CM | POA: Insufficient documentation

## 2012-08-24 DIAGNOSIS — M25561 Pain in right knee: Secondary | ICD-10-CM

## 2012-08-24 NOTE — Discharge Instructions (Signed)
Chest Contusion A contusion is a deep bruise. Bruises happen when an injury causes bleeding under the skin. Signs of bruising include pain, puffiness (swelling), and discolored skin. The bruise may turn blue, purple, or yellow. Pay attention to how you are doing. HOME CARE  Put ice on the injured area.  Put ice in a plastic bag.  Place a towel between the skin and the bag.  Leave the ice on for 15 to 20 minutes at a time, 3 to 4 times a day for the first 48 hours.  Rest.  Do not lift anything heavy.  Limit your activity as told by your doctor  Take 3 to 4 deep breaths every hour while awake. Hold your hand or a pillow over the sore area for support.  Breathe from the belly (abdomen).  Breathe in through the nose, as if you are smelling a flower.  Breathe out through the mouth, as if you are blowing out a candle.  Only take medicine as told by your doctor. GET HELP RIGHT AWAY IF:   You have trouble breathing or cough up thick spit (mucus).  You have chest pain that goes into the arms or jaw.  The skin is wet and pale.  You have a fever.  You feel dizzy, weak, or pass out (faint).  You cannot breathe easily.  The bruise is getting worse. MAKE SURE YOU:   Understand these instructions.  Will watch your condition.  Will get help right away if you are not doing well or get worse. Document Released: 02/09/2008 Document Revised: 11/15/2011 Document Reviewed: 02/09/2008 [facility] Patient Information 2013 [place], [place]. Knee Pain The knee is the complex joint between your thigh and your lower leg. It is made up of bones, tendons, ligaments, and cartilage. The bones that make up the knee are:  The femur in the thigh.  The tibia and fibula in the lower leg.  The patella or kneecap riding in the groove on the lower femur. CAUSES  Knee pain is a common complaint with many causes. A few of these causes are:  Injury, such as:  A ruptured ligament or tendon  injury.  Torn cartilage.  Medical conditions, such as:  Gout  Arthritis  Infections  Overuse, over training or overdoing a physical activity. Knee pain can be minor or severe. Knee pain can accompany debilitating injury. Minor knee problems often respond well to self-care measures or get well on their own. More serious injuries may need medical intervention or even surgery. SYMPTOMS The knee is complex. Symptoms of knee problems can vary widely. Some of the problems are:  Pain with movement and weight bearing.  Swelling and tenderness.  Buckling of the knee.  Inability to straighten or extend your knee.  Your knee locks and you cannot straighten it.  Warmth and redness with pain and fever.  Deformity or dislocation of the kneecap. DIAGNOSIS  Determining what is wrong may be very straight forward such as when there is an injury. It can also be challenging because of the complexity of the knee. Tests to make a diagnosis may include:  Your caregiver taking a history and doing a physical exam.  Routine X-rays can be used to rule out other problems. X-rays will not reveal a cartilage tear. Some injuries of the knee can be diagnosed by:  Arthroscopy a surgical technique by which a small video camera is inserted through tiny incisions on the sides of the knee. This procedure is used to examine and repair  internal knee joint problems. Tiny instruments can be used during arthroscopy to repair the torn knee cartilage (meniscus).  Arthrography is a radiology technique. A contrast liquid is directly injected into the knee joint. Internal structures of the knee joint then become visible on X-ray film.  An MRI scan is a non x-ray radiology procedure in which magnetic fields and a computer produce two- or three-dimensional images of the inside of the knee. Cartilage tears are often visible using an MRI scanner. MRI scans have largely replaced arthrography in diagnosing cartilage tears of  the knee.  Blood work.  Examination of the fluid that helps to lubricate the knee joint (synovial fluid). This is done by taking a sample out using a needle and a syringe. TREATMENT The treatment of knee problems depends on the cause. Some of these treatments are:  Depending on the injury, proper casting, splinting, surgery or physical therapy care will be needed.  Give yourself adequate recovery time. Do not overuse your joints. If you begin to get sore during workout routines, back off. Slow down or do fewer repetitions.  For repetitive activities such as cycling or running, maintain your strength and nutrition.  Alternate muscle groups. For example if you are a weight lifter, work the upper body on one day and the lower body the next.  Either tight or weak muscles do not give the proper support for your knee. Tight or weak muscles do not absorb the stress placed on the knee joint. Keep the muscles surrounding the knee strong.  Take care of mechanical problems.  If you have flat feet, orthotics or special shoes may help. See your caregiver if you need help.  Arch supports, sometimes with wedges on the inner or outer aspect of the heel, can help. These can shift pressure away from the side of the knee most bothered by osteoarthritis.  A brace called an "unloader" brace also may be used to help ease the pressure on the most arthritic side of the knee.  If your caregiver has prescribed crutches, braces, wraps or ice, use as directed. The acronym for this is PRICE. This means protection, rest, ice, compression and elevation.  Nonsteroidal anti-inflammatory drugs (NSAID's), can help relieve pain. But if taken immediately after an injury, they may actually increase swelling. Take NSAID's with food in your stomach. Stop them if you develop stomach problems. Do not take these if you have a history of ulcers, stomach pain or bleeding from the bowel. Do not take without your caregiver's approval  if you have problems with fluid retention, heart failure, or kidney problems.  For ongoing knee problems, physical therapy may be helpful.  Glucosamine and chondroitin are over-the-counter dietary supplements. Both may help relieve the pain of osteoarthritis in the knee. These medicines are different from the usual anti-inflammatory drugs. Glucosamine may decrease the rate of cartilage destruction.  Injections of a corticosteroid drug into your knee joint may help reduce the symptoms of an arthritis flare-up. They may provide pain relief that lasts a few months. You may have to wait a few months between injections. The injections do have a small increased risk of infection, water retention and elevated blood sugar levels.  Hyaluronic acid injected into damaged joints may ease pain and provide lubrication. These injections may work by reducing inflammation. A series of shots may give relief for as long as 6 months.  Topical painkillers. Applying certain ointments to your skin may help relieve the pain and stiffness of osteoarthritis. Ask your pharmacist  for suggestions. Many over the-counter products are approved for temporary relief of arthritis pain.  In some countries, doctors often prescribe topical NSAID's for relief of chronic conditions such as arthritis and tendinitis. A review of treatment with NSAID creams found that they worked as well as oral medications but without the serious side effects. PREVENTION  Maintain a healthy weight. Extra pounds put more strain on your joints.  Get strong, stay limber. Weak muscles are a common cause of knee injuries. Stretching is important. Include flexibility exercises in your workouts.  Be smart about exercise. If you have osteoarthritis, chronic knee pain or recurring injuries, you may need to change the way you exercise. This does not mean you have to stop being active. If your knees ache after jogging or playing basketball, consider switching to  swimming, water aerobics or other low-impact activities, at least for a few days a week. Sometimes limiting high-impact activities will provide relief.  Make sure your shoes fit well. Choose footwear that is right for your sport.  Protect your knees. Use the proper gear for knee-sensitive activities. Use kneepads when playing volleyball or laying carpet. Buckle your seat belt every time you drive. Most shattered kneecaps occur in car accidents.  Rest when you are tired. SEEK MEDICAL CARE IF:  You have knee pain that is continual and does not seem to be getting better.  SEEK IMMEDIATE MEDICAL CARE IF:  Your knee joint feels hot to the touch and you have a high fever. MAKE SURE YOU:   Understand these instructions.  Will watch your condition.  Will get help right away if you are not doing well or get worse. Document Released: 06/20/2007 Document Revised: 11/15/2011 Document Reviewed: 06/20/2007 [facility] Patient Information 2013 [place], [place].

## 2012-08-24 NOTE — ED Notes (Signed)
Fell on Sunday,  Pain rt knee and  Rt ant lower chest,   Hurts  To bear wt.  No HI.

## 2012-08-24 NOTE — ED Provider Notes (Signed)
History     CSN: [id number]  Arrival date & time 08/24/12  1345   First MD Initiated Contact with Patient 08/24/12 1357      Chief Complaint  Patient presents with  . Fall    (Consider location/radiation/quality/duration/timing/severity/associated sxs/prior treatment) HPI Comments: Patient c/o pain to her right knee and right anterior ribs.  Pain began 4 days prior to ED arrival. States she slipped in the grass and fell onto her right knee.  Pain has been progressing since the fall.  Today, she states pain has been worse and states her knee feels swollen.  Pain is worse with walking or standing, improves with rest.  She describes the rib pain as "soreness" to her chest with deep breathing.  She denies shortness of breath, abd pain, neck or back pain, LOC, or head injury. She denies symptoms prior to the fall.    Patient is a 78 y.o. female presenting with fall. The history is provided by the patient.  Fall The accident occurred more than 2 days ago. The fall occurred while walking. She landed on grass. There was no blood loss. The point of impact was the right knee (right ribs). The pain is present in the right knee (right ribs). The pain is mild. She was ambulatory at the scene. There was no entrapment after the fall. There was no drug use involved in the accident. There was no alcohol use involved in the accident. Pertinent negatives include no visual change, no fever, no numbness, no abdominal pain, no bowel incontinence, no nausea, no vomiting, no hematuria, no headaches, no hearing loss, no loss of consciousness and no tingling. The symptoms are aggravated by ambulation, standing and flexion. Treatments tried: oral narcotics. The treatment provided mild relief.    Past Medical History  Diagnosis Date  . Hyperlipidemia     takes Lipitor nightly  . Obesity   . DJD (degenerative joint disease)     Severe  . CAD (coronary artery disease)   . Chronic back pain   . Carpal tunnel  syndrome on left     Left Hand pain with numbness, new onset   . Difficult intubation     small airway  . Hypertension     takes Amlodipine and Metoprolol daily  . Blood clot associated with vein wall inflammation     hx of in right leg  . Sleep apnea     sleep study done 1yrs ago  . Shortness of breath     with exertion  . Pneumonia     hx of-in 2011  . Headache     occasionally  . Chronic back pain     buldging disc  . GERD (gastroesophageal reflux disease)     takes Omeprazole daily  . Gastric ulcer     hx of   . Constipation   . Diarrhea   . Nocturia   . Anemia   . Thyroid mass     right    Past Surgical History  Procedure Date  . Appendectomy 1987  . Carpal tunnel release right     x 3  . Partial hysterectomy 1972  . Arthroscopy left shoulder 1999  . Arthoscopy left knee 1992  . Back  surgery  lumbar 1989 /1999    x 2  . Tonsillectomy   . Rotator cuff repair     right  . Colonoscopy   . Esophagogastroduodenoscopy   . Cholecystectomy   . Thyroidectomy 11/17/2011    Procedure: THYROIDECTOMY;  Surgeon: Darletta Moll, MD;  Location: [facility] OR;  Service: ENT;  Laterality: Right;  WITH FROZEN SECTION  . Cardiac catheterization 05/2010  . Abdominal hysterectomy   . Back surgery     Family History  Problem Relation Age of Onset  . Anuerysm Mother   . Anesthesia problems Neg Hx   . Hypotension Neg Hx   . Malignant hyperthermia Neg Hx   . Pseudochol deficiency Neg Hx     History  Substance Use Topics  . Smoking status: Former Games developer  . Smokeless tobacco: Not on file     Comment: quit in 1987  . Alcohol Use: No    OB History    Grav Para Term Preterm Abortions TAB SAB Ect Mult Living                  Review of Systems  Constitutional: Negative for fever and chills.  HENT: Negative for neck pain and neck stiffness.   Respiratory: Negative for chest tightness, shortness of breath, wheezing and stridor.   Cardiovascular: Positive for chest pain.    Gastrointestinal: Negative for nausea, vomiting, abdominal pain and bowel incontinence.  Genitourinary: Negative for dysuria, hematuria and difficulty urinating.  Musculoskeletal: Positive for joint swelling and arthralgias. Negative for back pain.  Skin: Negative for color change and wound.  Neurological: Negative for tingling, loss of consciousness, numbness and headaches.  Psychiatric/Behavioral: Negative for confusion.  All other systems reviewed and are negative.    Allergies  Ace inhibitors; Bupropion hcl; Codeine; Penicillins; and Angiotensin receptor blockers  Home Medications   Current Outpatient Rx  Name  Route  Sig  Dispense  Refill  . ACYCLOVIR 5 % EX OINT   Topical   Apply 1 application topically every 8 (eight) hours as needed. For fever blisters         . AMLODIPINE BESYLATE 10 MG PO TABS      Dose increase effective 03/07/2012, discontinue amlodipine 5mg  tablets   30 tablet   11   . VITAMIN C 500 MG PO TABS   Oral   Take 500 mg by mouth daily.          . ASPIRIN 81 MG PO TABS   Oral   Take 81 mg by mouth daily.         . ATORVASTATIN CALCIUM 20 MG PO TABS      take 1 tablet by mouth at bedtime   30 tablet   3     Refill Approved   . CALCIUM CARBONATE 1250 MG PO TABS   Oral   Take 1 tablet by mouth daily.         Marland Kitchen DICLOFENAC SODIUM 1 % TD GEL   Topical   Apply 1 application topically 4 (four) times daily.   100 g   4   . EPINEPHRINE 0.3 MG/0.3ML IJ DEVI   Intramuscular   Inject 0.3 mg into the muscle once as needed. For severe allergic reactions         . FERROUS SULFATE 325 (65 FE) MG PO TABS   Oral   Take 325 mg by mouth daily with breakfast.          . HYDROCHLOROTHIAZIDE 12.5 MG PO CAPS      take 1 capsule by mouth once daily   30 capsule   5   . HYDROCODONE-ACETAMINOPHEN 7.5-750 MG PO TABS      TAKE 1 TABLET 4 TIMES A DAY   120 tablet  2   . KLOR-CON 10 10 MEQ PO TBCR      take 1 tablet by mouth twice a  day   60 each   5   . LEVOTHYROXINE SODIUM 25 MCG PO TABS   Oral   Take 1 tablet (25 mcg total) by mouth daily.   90 tablet   1   . METAXALONE 800 MG PO TABS   Oral   Take 1 tablet (800 mg total) by mouth 3 (three) times daily.   90 tablet   3   . METOPROLOL TARTRATE 25 MG PO TABS      take 1 tablet by mouth twice a day   60 tablet   5   . ADULT MULTIVITAMIN W/MINERALS CH   Oral   Take 1 tablet by mouth daily.         Marland Kitchen NITROGLYCERIN 0.4 MG SL SUBL   Sublingual   Place 0.4 mg under the tongue every 5 (five) minutes as needed. For chest pain         . OMEPRAZOLE 40 MG PO CPDR      take 1 capsule by mouth once daily   30 capsule   5   . PYRIDOXINE HCL 200 MG PO TABS   Oral   Take 200 mg by mouth 2 (two) times daily.            BP 142/72  Pulse 70  Temp 97.9 F (36.6 C) (Oral)  Resp 20  Ht 5\' 2"  (1.575 m)  Wt 175 lb (79.379 kg)  BMI 32.01 kg/m2  SpO2 100%  Physical Exam  Nursing note and vitals reviewed. Constitutional: She is oriented to person, place, and time. She appears well-developed and well-nourished. No distress.  HENT:  Head: Normocephalic and atraumatic.  Eyes: EOM are normal. Pupils are equal, round, and reactive to light.  Neck: Normal range of motion. Neck supple.  Cardiovascular: Normal rate, regular rhythm, normal heart sounds and intact distal pulses.   No murmur heard. Pulmonary/Chest: Effort normal and breath sounds normal. No respiratory distress. She has no wheezes. She has no rales. Chest wall is not dull to percussion. She exhibits tenderness and bony tenderness. She exhibits no mass, no laceration, no crepitus, no edema, no deformity, no swelling and no retraction.    Abdominal: Soft. There is no tenderness.  Musculoskeletal: She exhibits tenderness.       Right knee: She exhibits bony tenderness. She exhibits normal range of motion, no swelling, no effusion, no ecchymosis, no deformity and no laceration. tenderness found.  Patellar tendon tenderness noted.       Legs:      ttp of the right anterior knee.  No edema,  erythema, bruising or deformity.  DP pulse is brisk, distal sensation intact.  No calf pain or swelling  Lymphadenopathy:    She has no cervical adenopathy.  Neurological: She is alert and oriented to person, place, and time. She exhibits normal muscle tone. Coordination normal.  Skin: Skin is warm and dry. No erythema.    ED Course  Procedures (including critical care time)  Labs Reviewed - No data to display Dg Knee Complete 4 Views Right  08/24/2012  *RADIOLOGY REPORT*  Clinical Data: Fall.  Knee pain.  RIGHT KNEE - COMPLETE 4+ VIEW  Comparison: None.  Findings: The knee is located.  There is mild tricompartmental osteoarthritis, with small osteophytes seen in all three compartments.   No acute fracture is identified.  There is a  probable small probable 8 mm loose body within the anterior aspect of the joint.  No definite joint effusion.  IMPRESSION:  1.  No acute bony abnormality identified. 2.  Mild tricompartmental osteoarthritis and probable 8 mm loose body in the joint.   Original Report Authenticated By: Britta Mccreedy, M.D.      Knee immobilizer applied, pain improved, remains neurovascularly intact.   MDM    Patient has ttp of the right anterior knee and  right lower ribs. Pt is able to bear minimal wt to the knee.  No erythema, bruising, crepitus or effusion.  Vitals stable.  abd is NT.    Patient also seen by EDP.  Care plan discussed  X-ray findings were discussed with the patient. She agrees to close followup with her orthopedic Dr. at [facility] orthopedics. Patient has a cane for weight bearing and currently taking pain medication, agrees to continue current medications       Lavan Imes L. [place], [place] 08/26/12 [id number]

## 2012-08-24 NOTE — ED Notes (Signed)
Left in c/o family for transport home; instructions and f/u information given/reviewed - verbalizes understanding.

## 2012-08-25 ENCOUNTER — Other Ambulatory Visit: Payer: Self-pay | Admitting: Family Medicine

## 2012-08-25 ENCOUNTER — Telehealth: Payer: Self-pay | Admitting: Family Medicine

## 2012-08-25 MED ORDER — EPINEPHRINE 0.3 MG/0.3ML IJ DEVI: 0.3000 mg | Freq: Once | INTRAMUSCULAR | Status: DC | PRN

## 2012-08-25 NOTE — Telephone Encounter (Signed)
Med sent in.

## 2012-08-27 DIAGNOSIS — Z0000 Encounter for general adult medical examination without abnormal findings: Secondary | ICD-10-CM | POA: Insufficient documentation

## 2012-08-27 NOTE — Progress Notes (Signed)
  Subjective:    Patient ID: [name], female    DOB: 14-Jan-1934, 78 y.o.   MRN: [id number]  HPI    Review of Systems     Objective:   Physical Exam  Rectal exam done at visit as past due. No mass palpated, heme negative stool      Assessment & Plan:

## 2012-08-27 NOTE — Assessment & Plan Note (Signed)
Annual wellness exam completed and documented in record. Pt has no issues with memory loss or depression. She has some limitation in mobility due to joint and spine disease and has an increased fall risk She is able to live independently, and she does have good family support Closer attention to healthy diet as well as well as a commitment to to regular physical activity is encouraged to improve health. End of life issues were addressed, she needs to discuss further with her children so they can be formalized and documented. She is a full code and wishes best appropriate available care .

## 2012-08-29 NOTE — ED Provider Notes (Signed)
Medical screening examination/treatment/procedure(s) were conducted as a shared visit with non-physician practitioner(s) and myself.  I personally evaluated the patient during the encounter.  X-ray of right knee negative for fracture. Knee immobilizer. Orthopedics referral  Donnetta Hutching, MD 08/29/12 [phone]

## 2012-09-22 ENCOUNTER — Other Ambulatory Visit: Payer: Self-pay | Admitting: Family Medicine

## 2012-09-22 DIAGNOSIS — Z139 Encounter for screening, unspecified: Secondary | ICD-10-CM

## 2012-10-09 ENCOUNTER — Ambulatory Visit (HOSPITAL_COMMUNITY)
Admission: RE | Admit: 2012-10-09 | Discharge: 2012-10-09 | Disposition: A | Discharge status: Home / Self Care | Payer: Managed Care, Other (non HMO) | Source: Ambulatory Visit | Attending: Family Medicine | Admitting: Family Medicine

## 2012-10-09 DIAGNOSIS — Z1231 Encounter for screening mammogram for malignant neoplasm of breast: Secondary | ICD-10-CM | POA: Insufficient documentation

## 2012-10-09 DIAGNOSIS — Z139 Encounter for screening, unspecified: Secondary | ICD-10-CM

## 2012-11-20 ENCOUNTER — Other Ambulatory Visit: Payer: Self-pay | Admitting: Family Medicine

## 2012-11-23 ENCOUNTER — Ambulatory Visit (INDEPENDENT_AMBULATORY_CARE_PROVIDER_SITE_OTHER): Payer: Managed Care, Other (non HMO) | Admitting: Otolaryngology

## 2012-11-23 DIAGNOSIS — D449 Neoplasm of uncertain behavior of unspecified endocrine gland: Secondary | ICD-10-CM

## 2012-11-23 LAB — CBC WITH DIFFERENTIAL/PLATELET
Basophils Absolute: 0 10*3/uL (ref 0.0–0.1)
Basophils Relative: 0 % (ref 0–1)
Eosinophils Absolute: 0.2 10*3/uL (ref 0.0–0.7)
Eosinophils Relative: 5 % (ref 0–5)
HCT: 38.7 % (ref 36.0–46.0)
Hemoglobin: 12.5 g/dL (ref 12.0–15.0)
Lymphocytes Relative: 49 % — ABNORMAL HIGH (ref 12–46)
Lymphs Abs: 2.2 10*3/uL (ref 0.7–4.0)
MCH: 28.9 pg (ref 26.0–34.0)
MCHC: 32.3 g/dL (ref 30.0–36.0)
MCV: 89.4 fL (ref 78.0–100.0)
Monocytes Absolute: 0.4 10*3/uL (ref 0.1–1.0)
Monocytes Relative: 9 % (ref 3–12)
Neutro Abs: 1.7 10*3/uL (ref 1.7–7.7)
Neutrophils Relative %: 37 % — ABNORMAL LOW (ref 43–77)
Platelets: 279 10*3/uL (ref 150–400)
RBC: 4.33 MIL/uL (ref 3.87–5.11)
RDW: 13.8 % (ref 11.5–15.5)
WBC: 4.5 10*3/uL (ref 4.0–10.5)

## 2012-11-29 ENCOUNTER — Other Ambulatory Visit: Payer: Self-pay | Admitting: Family Medicine

## 2012-12-07 LAB — COMPREHENSIVE METABOLIC PANEL
ALT: 14 U/L (ref 0–35)
AST: 23 U/L (ref 0–37)
Albumin: 4.4 g/dL (ref 3.5–5.2)
Alkaline Phosphatase: 67 U/L (ref 39–117)
BUN: 23 mg/dL (ref 6–23)
CO2: 30 mEq/L (ref 19–32)
Calcium: 9.7 mg/dL (ref 8.4–10.5)
Chloride: 103 mEq/L (ref 96–112)
Creat: 1.16 mg/dL — ABNORMAL HIGH (ref 0.50–1.10)
Glucose, Bld: 84 mg/dL (ref 70–99)
Potassium: 4.5 mEq/L (ref 3.5–5.3)
Sodium: 140 mEq/L (ref 135–145)
Total Bilirubin: 0.5 mg/dL (ref 0.3–1.2)
Total Protein: 6.5 g/dL (ref 6.0–8.3)

## 2012-12-07 LAB — LIPID PANEL
Cholesterol: 166 mg/dL (ref 0–200)
HDL: 42 mg/dL (ref >39)
LDL Cholesterol: 99 mg/dL (ref 0–99)
Total CHOL/HDL Ratio: 4 Ratio
Triglycerides: 124 mg/dL (ref <150)
VLDL: 25 mg/dL (ref 0–40)

## 2012-12-07 LAB — HEMOGLOBIN A1C
Hgb A1c MFr Bld: 6.1 % — ABNORMAL HIGH (ref <5.7)
Mean Plasma Glucose: 128 mg/dL — ABNORMAL HIGH (ref <117)

## 2012-12-13 ENCOUNTER — Ambulatory Visit: Payer: Medicare Other | Admitting: Family Medicine

## 2012-12-19 ENCOUNTER — Ambulatory Visit (INDEPENDENT_AMBULATORY_CARE_PROVIDER_SITE_OTHER): Payer: Managed Care, Other (non HMO) | Admitting: Family Medicine

## 2012-12-19 ENCOUNTER — Encounter: Payer: Self-pay | Admitting: Family Medicine

## 2012-12-19 VITALS — BP 126/80 | HR 70 | Resp 18 | Wt 183.1 lb

## 2012-12-19 DIAGNOSIS — F32A Depression, unspecified: Secondary | ICD-10-CM

## 2012-12-19 DIAGNOSIS — E039 Hypothyroidism, unspecified: Secondary | ICD-10-CM

## 2012-12-19 DIAGNOSIS — R7303 Prediabetes: Secondary | ICD-10-CM

## 2012-12-19 DIAGNOSIS — I10 Essential (primary) hypertension: Secondary | ICD-10-CM

## 2012-12-19 DIAGNOSIS — E785 Hyperlipidemia, unspecified: Secondary | ICD-10-CM

## 2012-12-19 DIAGNOSIS — F329 Major depressive disorder, single episode, unspecified: Secondary | ICD-10-CM

## 2012-12-19 DIAGNOSIS — K219 Gastro-esophageal reflux disease without esophagitis: Secondary | ICD-10-CM

## 2012-12-19 DIAGNOSIS — R7309 Other abnormal glucose: Secondary | ICD-10-CM

## 2012-12-19 DIAGNOSIS — F3289 Other specified depressive episodes: Secondary | ICD-10-CM

## 2012-12-19 MED ORDER — OMEPRAZOLE 40 MG PO CPDR: DELAYED_RELEASE_CAPSULE | ORAL | Status: DC

## 2012-12-19 MED ORDER — METOPROLOL TARTRATE 25 MG PO TABS: ORAL_TABLET | ORAL | Status: DC

## 2012-12-19 MED ORDER — FLUOXETINE HCL (PMDD) 10 MG PO TABS: 1.0000 | ORAL_TABLET | Freq: Every day | ORAL | Status: DC

## 2012-12-19 MED ORDER — POTASSIUM CHLORIDE ER 10 MEQ PO TBCR: EXTENDED_RELEASE_TABLET | ORAL | Status: DC

## 2012-12-19 MED ORDER — HYDROCHLOROTHIAZIDE 12.5 MG PO CAPS: ORAL_CAPSULE | ORAL | Status: DC

## 2012-12-19 NOTE — Progress Notes (Signed)
  Subjective:    Patient ID: [name], female    DOB: 11/09/1933, 79 y.o.   MRN: [id number]  HPI The PT is here for follow up and re-evaluation of chronic medical conditions, medication management and review of any available recent lab and radiology data.  Preventive health is updated, specifically  Cancer screening and Immunization.   Pt states these past 3 months have been horrid she has been increasingly withdrawn and lost interest in almost everything, grieving her parents a lot and not relating much wit family. Overeating with significant weight gain  Review of Systems See HPI Denies recent fever or chills. Denies sinus pressure, nasal congestion, ear pain or sore throat. Denies chest congestion, productive cough or wheezing. Denies chest pains, palpitations and leg swelling Denies abdominal pain, nausea, vomiting,diarrhea or constipation.   Denies dysuria, frequency, hesitancy or incontinence. Chronic  joint pain, swelling and limitation in mobility. Denies headaches, seizures, numbness, or tingling. Denies skin break down or rash.        Objective:   Physical Exam  Patient alert and oriented and in no cardiopulmonary distress.  HEENT: No facial asymmetry, EOMI, no sinus tenderness,  oropharynx pink and moist.  Neck supple no adenopathy.  Chest: Clear to auscultation bilaterally.  CVS: S1, S2 no murmurs, no S3.  ABD: Soft non tender. Bowel sounds normal.  Ext: No edema  MS: decreased ROM spine, shoulders, hips and knees.  Skin: Intact, no ulcerations or rash noted.  Psych: Good eye contact, flat  affect. Memory intact tearful at times and  depressed appearing.  CNS: CN 2-12 intact, power, tone and sensation normal throughout.       Assessment & Plan:

## 2012-12-19 NOTE — Patient Instructions (Addendum)
F/u in 6 weeks  You will start, medication  For depression  Please commit to discipline in eating and in getting out and becoming involved  again

## 2012-12-22 ENCOUNTER — Telehealth: Payer: Self-pay | Admitting: Family Medicine

## 2012-12-22 DIAGNOSIS — R946 Abnormal results of thyroid function studies: Secondary | ICD-10-CM

## 2012-12-22 NOTE — Telephone Encounter (Signed)
Pls contact pt , she dOES need a tsh level checked, she needs to have this drawn first week of April 28, her next appt is end May I tried to call her no answer.Pls order test She does have hypothyroidism ands is on supplemental synthroid Let her know I hope her new  med is  helping her to feel better

## 2012-12-22 NOTE — Assessment & Plan Note (Signed)
updated lab needed 

## 2012-12-22 NOTE — Assessment & Plan Note (Signed)
Controlled, no change in medication DASH diet and commitment to daily physical activity for a minimum of 30 minutes discussed and encouraged, as a part of hypertension management. The importance of attaining a healthy weight is also discussed.  

## 2012-12-22 NOTE — Assessment & Plan Note (Signed)
Controlled, no change in medication Hyperlipidemia:Low fat diet discussed and encouraged.  \ 

## 2012-12-22 NOTE — Assessment & Plan Note (Signed)
Unchanged, HBA1C remains at 6.1 Patient educated about the importance of limiting  Carbohydrate intake , the need to commit to daily physical activity for a minimum of 30 minutes , and to commit weight loss. The fact that changes in all these areas will reduce or eliminate all together the development of diabetes is stressed.

## 2012-12-22 NOTE — Assessment & Plan Note (Signed)
Controlled, no change in medication  

## 2012-12-22 NOTE — Assessment & Plan Note (Signed)
Moderately severe depression, not suicidal or homicidal, would benefit significantly from therapy, refuses at this time, but willing to start medication and commit to increased physical activity, and more involvement with family

## 2012-12-25 NOTE — Addendum Note (Signed)
Addended by: Abner Greenspan on: 12/25/2012 09:06 AM   Modules accepted: Orders

## 2012-12-25 NOTE — Telephone Encounter (Signed)
Patient aware to have labs done and order faxed

## 2013-01-01 LAB — TSH: TSH: 3.471 u[IU]/mL (ref 0.350–4.500)

## 2013-01-18 ENCOUNTER — Other Ambulatory Visit: Payer: Self-pay | Admitting: Family Medicine

## 2013-01-19 ENCOUNTER — Other Ambulatory Visit: Payer: Self-pay

## 2013-01-19 MED ORDER — HYDROCODONE-ACETAMINOPHEN 7.5-325 MG PO TABS: 1.0000 | ORAL_TABLET | Freq: Four times a day (QID) | ORAL | Status: DC

## 2013-01-24 ENCOUNTER — Other Ambulatory Visit: Payer: Self-pay | Admitting: Family Medicine

## 2013-01-30 ENCOUNTER — Encounter: Payer: Self-pay | Admitting: Family Medicine

## 2013-01-30 ENCOUNTER — Ambulatory Visit (INDEPENDENT_AMBULATORY_CARE_PROVIDER_SITE_OTHER): Payer: Managed Care, Other (non HMO) | Admitting: Family Medicine

## 2013-01-30 VITALS — BP 126/80 | HR 72 | Resp 18 | Ht 61.0 in | Wt 178.0 lb

## 2013-01-30 DIAGNOSIS — R7303 Prediabetes: Secondary | ICD-10-CM

## 2013-01-30 DIAGNOSIS — F32A Depression, unspecified: Secondary | ICD-10-CM

## 2013-01-30 DIAGNOSIS — F329 Major depressive disorder, single episode, unspecified: Secondary | ICD-10-CM

## 2013-01-30 DIAGNOSIS — E039 Hypothyroidism, unspecified: Secondary | ICD-10-CM

## 2013-01-30 DIAGNOSIS — F3289 Other specified depressive episodes: Secondary | ICD-10-CM

## 2013-01-30 DIAGNOSIS — R7309 Other abnormal glucose: Secondary | ICD-10-CM

## 2013-01-30 DIAGNOSIS — R0789 Other chest pain: Secondary | ICD-10-CM

## 2013-01-30 DIAGNOSIS — E785 Hyperlipidemia, unspecified: Secondary | ICD-10-CM

## 2013-01-30 DIAGNOSIS — I10 Essential (primary) hypertension: Secondary | ICD-10-CM

## 2013-01-30 MED ORDER — LEVOTHYROXINE SODIUM 25 MCG PO TABS: 25.0000 ug | ORAL_TABLET | Freq: Every day | ORAL | Status: DC

## 2013-01-30 MED ORDER — FLUOXETINE HCL 20 MG PO CAPS: 20.0000 mg | ORAL_CAPSULE | Freq: Every day | ORAL | Status: DC

## 2013-01-30 NOTE — Assessment & Plan Note (Signed)
Slight improvement though PHQ9 score is unchanged. Will increase dose of fluoxetine , again advised pt to see therapist, states she needs to be out of town for the next 2 months and is unwilling to commit to this at this time

## 2013-01-30 NOTE — Assessment & Plan Note (Signed)
Controlled, no change in medication DASH diet and commitment to daily physical activity for a minimum of 30 minutes discussed and encouraged, as a part of hypertension management. The importance of attaining a healthy weight is also discussed.  

## 2013-01-30 NOTE — Assessment & Plan Note (Signed)
Hyperlipidemia:Low fat diet discussed and encouraged.  Controlled, no change in medication   

## 2013-01-30 NOTE — Assessment & Plan Note (Signed)
New chest pai with increased exertional fatigue for the past 1 week, office EKG shows no ischemic change, was evaluated by cardiology in the recent past but asymptomatic at that ime will refer back for re eval

## 2013-01-30 NOTE — Patient Instructions (Addendum)
F/u in 2.5 month    Dose increase in fluoxetine to 20mg  daily, OK to take two  10mg   Daily until done.Though you are doing better a lot of room for improvement.  I strongly suggest therapy and hope for improved health with your friend.   Continue to build on family relationships  hBA1c and chen 7 non fasting in 2.5 month  It is important that you exercise regularly at least 30 minutes 5 times a week. If you develop chest pain, have severe difficulty breathing, or feel very tired, stop exercising immediately and seek medical attention    A healthy diet is rich in fruit, vegetables and whole grains. Poultry fish, nuts and beans are a healthy choice for protein rather then red meat. A low sodium diet and drinking 64 ounces of water daily is generally recommended. Oils and sweet should be limited. Carbohydrates especially for those who are diabetic or overweight, should be limited to 30-45 gram per meal. It is important to eat on a regular schedule, at least 3 times daily. Snacks should be primarily fruits, vegetables or nuts.   You are referred to cardiology for evaluation of new exertional fatigue, and you will have an EKG in the office today

## 2013-01-30 NOTE — Assessment & Plan Note (Signed)
Controlled, no change in medication  

## 2013-01-30 NOTE — Progress Notes (Signed)
  Subjective:    Patient ID: [name], female    DOB: 01-Nov-1933, 79 y.o.   MRN: [id number]  HPI Pt in for f/u of depression in particular. State she does feel better although she remains quite symptomatic. Her PHQ 9 score is actually unchanged. She has been more involved with friends and family and actually this past weekend had the double stress of death of a younger family member, and then her best friend had to be urgently hospitalized .She continues to have a strained relationship with 1 of her 4 children who wants no contact with her, and feels as though her other 3 daughters are trying to "run her life" C/O intermittent chest discomfort this past week, relieved by omeprazole , but also reports increased exertional fatigue with minimal activity   Review of Systems See HPI Denies recent fever or chills. Denies sinus pressure, nasal congestion, ear pain or sore throat. Denies chest congestion, productive cough or wheezing. Denies PND, orthopnea, palpitations and leg swelling Denies abdominal pain, nausea, vomiting,diarrhea or constipation.   Denies dysuria, frequency, hesitancy or incontinence. Chronic  joint pain,  and limitation in mobility. Denies headaches, seizures, numbness, or tingling.  Denies skin break down or rash.        Objective:   Physical Exam Patient alert and oriented and in no cardiopulmonary distress.Appears less depressed than a last visit  HEENT: No facial asymmetry, EOMI, no sinus tenderness,  oropharynx pink and moist.  Neck supple no adenopathy.  Chest: Clear to auscultation bilaterally.No reproducible chest wall tenderness  CVS: S1, S2 no murmurs, no S3. EKG : sinus bradycardia, no ischemic changes  ABD: Soft non tender. Bowel sounds normal.  Ext: No edema  MS: Adequate though reduced  ROM spine, shoulders, hips and knees.  Skin: Intact, no ulcerations or rash noted.  Psych: Good eye contact, normal affect. Memory intact not anxious  mildly  depressed appearing.  CNS: CN 2-12 intact, power, tone and sensation normal throughout.        Assessment & Plan:

## 2013-02-07 ENCOUNTER — Ambulatory Visit (INDEPENDENT_AMBULATORY_CARE_PROVIDER_SITE_OTHER): Payer: Managed Care, Other (non HMO) | Admitting: Cardiovascular Disease

## 2013-02-07 ENCOUNTER — Encounter: Payer: Self-pay | Admitting: Cardiovascular Disease

## 2013-02-07 VITALS — BP 136/84 | HR 54 | Ht 61.0 in | Wt 179.0 lb

## 2013-02-07 DIAGNOSIS — R5383 Other fatigue: Secondary | ICD-10-CM

## 2013-02-07 DIAGNOSIS — F32A Depression, unspecified: Secondary | ICD-10-CM

## 2013-02-07 DIAGNOSIS — E785 Hyperlipidemia, unspecified: Secondary | ICD-10-CM

## 2013-02-07 DIAGNOSIS — R0789 Other chest pain: Secondary | ICD-10-CM

## 2013-02-07 DIAGNOSIS — R5381 Other malaise: Secondary | ICD-10-CM

## 2013-02-07 DIAGNOSIS — I201 Angina pectoris with documented spasm: Secondary | ICD-10-CM | POA: Insufficient documentation

## 2013-02-07 DIAGNOSIS — F329 Major depressive disorder, single episode, unspecified: Secondary | ICD-10-CM

## 2013-02-07 DIAGNOSIS — I498 Other specified cardiac arrhythmias: Secondary | ICD-10-CM

## 2013-02-07 DIAGNOSIS — F3289 Other specified depressive episodes: Secondary | ICD-10-CM

## 2013-02-07 DIAGNOSIS — I10 Essential (primary) hypertension: Secondary | ICD-10-CM

## 2013-02-07 NOTE — Assessment & Plan Note (Signed)
Her more recent episodes of right upper quadrant discomfort are very different from previous episodes of angina, including the discomfort that was retrosternal pressure-like when she had her non-ST segment elevation myocardial infarction. She has had a cholecystectomy already.

## 2013-02-07 NOTE — Assessment & Plan Note (Signed)
Current levels are satisfactory. She has been on statins for very many years and I do not think her current fatigue can be attributed to these medications. No changes are recommended.

## 2013-02-07 NOTE — Assessment & Plan Note (Signed)
She is not hypothyroid at this time. Her symptoms may be related to sinus bradycardia and this in turn may be secondary to beta blockers. I have asked her to wean off the Toprol by cutting the dose in half for 3 or 4 days and then discontinuing altogether. After the beta blockers are out of her system I recommend that she wear a 24-hour Holter monitor so that we can evaluate her for chronotropic incompetence.

## 2013-02-07 NOTE — Assessment & Plan Note (Signed)
Have gently explored whether depression may not be the cause of her fatigue. Is in a long time since her father has passed away and she still is marked by these events. I wonder she feels rather "pointless" and "hopeless" now of she has lost her primary role is caregiver for her parents.

## 2013-02-07 NOTE — Assessment & Plan Note (Signed)
Control is fair, but I would like to stop her beta blocker since it may be causing her fatigue. Of note she cannot take ACE inhibitors and gentamicin receptor blockers due to history of angioedema. She is already on a full dose of amlodipine and this should be continued as empirical therapy for coronary vasospasm. If additional hypertensives are necessary I probably chooses diuretic.

## 2013-02-07 NOTE — Progress Notes (Signed)
Patient ID: [name], female   DOB: 29-Dec-1933, 79 y.o.   MRN: [id number]      Reason for office visit Fatigue, systemic hypertension, vasospastic angina.  [name] is a 79 year old woman that returns with complaints of profound fatigue. She described it at her last appointment in April as well. At that time I thought it might be due to insufficiently treated hypothyroidism, but her TSH was within normal range. It is still the problem. She feels exhausted just walking to the mailbox. It is clearly predominantly fatigue rather than dyspnea.  Hashad a couple of episodes of right upper quadrant discomfort, the most recent one occurring when she was seen Dr. Lodema Hong. Neither one of these was associated with activity. The symptoms are quite different from the chest pressure that she expressed which had a small non-ST segment elevation myocardial infarction about 3 years ago. At that time coronary angiography showed minimal coronary atherosclerosis and a presumptive diagnosis of vasospastic angina/infarction was made. He has been on statin medication for at least 6 years and it is hard to believe that this is causing her fatigue. Similarly she has been on a beta blocker for about 6 years.  She describes a few symptoms that suggest depression. I think she clearly has anhedonia. She repeatedly used the words "I don't feel like" and "I don't want to" when referring to a variety of activities. She was the sole caregiver for her widowed father for about 8 years as his health gradually declined. It sounds that she has some degree of lack of direction since he passed away. She became teary-eyed discussing her parents. It has been about 2 years since her father died.  Allergies  Allergen Reactions  . Ace Inhibitors Anaphylaxis    Was on ventilator for six days due to airway swelling shut while on ACE inhibitors.  . Bupropion Hcl Anaphylaxis  . Codeine Anaphylaxis  . Penicillins Anaphylaxis  .  Angiotensin Receptor Blockers     Patient unable to recall what medication this might have been and/or her reaction to it.    Current Outpatient Prescriptions  Medication Sig Dispense Refill  . amLODipine (NORVASC) 10 MG tablet Dose increase effective 03/07/2012, discontinue amlodipine 5mg  tablets  30 tablet  11  . Ascorbic Acid (VITAMIN C) 500 MG tablet Take 500 mg by mouth daily.       Marland Kitchen aspirin 81 MG tablet Take 81 mg by mouth daily.      Marland Kitchen atorvastatin (LIPITOR) 20 MG tablet take 1 tablet by mouth at bedtime  30 tablet  5  . calcium carbonate (OS-CAL - DOSED IN MG OF ELEMENTAL CALCIUM) 1250 MG tablet Take 1 tablet by mouth daily.      . diclofenac sodium (VOLTAREN) 1 % GEL Apply 1 application topically 4 (four) times daily.  100 g  4  . EPINEPHrine (EPIPEN 2-PAK) 0.3 mg/0.3 mL DEVI Inject 0.3 mLs (0.3 mg total) into the muscle once as needed. For severe allergic reactions  1 Device  1  . ferrous sulfate 325 (65 FE) MG tablet Take 325 mg by mouth daily with breakfast.       . FLUoxetine (PROZAC) 20 MG capsule Take 1 capsule (20 mg total) by mouth daily.  30 capsule  5  . Fluoxetine HCl, PMDD, 10 MG TABS Take 1 tablet (10 mg total) by mouth daily.  30 tablet  3  . hydrochlorothiazide (MICROZIDE) 12.5 MG capsule take 1 capsule by mouth once daily  30 capsule  5  . HYDROcodone-acetaminophen (NORCO) 7.5-325 MG per tablet Take 1 tablet by mouth 4 (four) times daily.  120 tablet  0  . levothyroxine (LEVOTHROID) 25 MCG tablet Take 1 tablet (25 mcg total) by mouth daily.  90 tablet  1  . metoprolol tartrate (LOPRESSOR) 25 MG tablet take 1 tablet by mouth twice a day  60 tablet  5  . Multiple Vitamin (MULITIVITAMIN WITH MINERALS) TABS Take 1 tablet by mouth daily.      . nitroGLYCERIN (NITROSTAT) 0.4 MG SL tablet Place 0.4 mg under the tongue every 5 (five) minutes as needed. For chest pain      . omeprazole (PRILOSEC) 40 MG capsule take 1 capsule by mouth once daily  30 capsule  5  . potassium  chloride (KLOR-CON 10) 10 MEQ tablet take 1 tablet by mouth twice a day  60 tablet  5  . pyridoxine (B-6) 200 MG tablet Take 200 mg by mouth 2 (two) times daily.       Marland Kitchen ZOVIRAX 5 % APPLY TOPICALLY EVERY 3 HOURS.  30 g  1   Current Facility-Administered Medications  Medication Dose Route Frequency Provider Last Rate Last Dose  . Influenza (>/= 3 years) inactive virus vaccine (FLVIRIN/FLUZONE) injection SUSP 0.5 mL  0.5 mL Intramuscular Once [name], MD        Past Medical History  Diagnosis Date  . Hyperlipidemia     takes Lipitor nightly  . Obesity   . DJD (degenerative joint disease)     Severe  . CAD (coronary artery disease)   . Chronic back pain   . Carpal tunnel syndrome on left     Left Hand pain with numbness, new onset   . Difficult intubation     small airway  . Hypertension     takes Amlodipine and Metoprolol daily  . Blood clot associated with vein wall inflammation     hx of in right leg  . Sleep apnea     sleep study done 28yrs ago  . Shortness of breath     with exertion  . Pneumonia     hx of-in 2011  . Headache(784.0)     occasionally  . Chronic back pain     buldging disc  . GERD (gastroesophageal reflux disease)     takes Omeprazole daily  . Gastric ulcer     hx of   . Constipation   . Diarrhea   . Nocturia   . Anemia   . Thyroid mass     right    Past Surgical History  Procedure Laterality Date  . Appendectomy  1987  . Carpal tunnel release right      x 3  . Partial hysterectomy  1972  . Arthroscopy left shoulder  1999  . Arthoscopy left knee  1992  . Back  surgery  lumbar  1989 /1999    x 2  . Tonsillectomy    . Rotator cuff repair      right  . Colonoscopy    . Esophagogastroduodenoscopy    . Cholecystectomy    . Thyroidectomy  11/17/2011    Procedure: THYROIDECTOMY;  Surgeon: Darletta Moll, MD;  Location: [facility] OR;  Service: ENT;  Laterality: Right;  WITH FROZEN SECTION  . Cardiac catheterization  05/2010  . Abdominal  hysterectomy    . Back surgery      Family History  Problem Relation Age of Onset  . Anuerysm Mother   . Anesthesia  problems Neg Hx   . Hypotension Neg Hx   . Malignant hyperthermia Neg Hx   . Pseudochol deficiency Neg Hx     History   Social History  . Marital Status: Widowed    Spouse Name: N/A    Number of Children: 4  . Years of Education: N/A   Occupational History  .     Social History Main Topics  . Smoking status: Former Smoker    Types: Cigarettes    Quit date: 09/06/1984  . Smokeless tobacco: Not on file  . Alcohol Use: No     Comment: Casual  . Drug Use: No  . Sexually Active: No   Other Topics Concern  . Not on file   Social History Narrative  . No narrative on file    Review of systems: Her major complaint is fatigue and exhaustion with activity. The patient specifically denies any chest pain at rest or exertion, dyspnea at rest or with exertion, orthopnea, paroxysmal nocturnal dyspnea, syncope, palpitations, focal neurological deficits, intermittent claudication, lower extremity edema, unexplained weight gain, cough, hemoptysis or wheezing. She has chronic low back pain. She describes symptoms of anhedonia. She no longer takes pleasure in an arts and crafts. She procrastinates and do housework or leaving the house to do chores.   PHYSICAL EXAM BP 136/84  Pulse 54  Ht 5\' 1"  (1.549 m)  Wt 179 lb (81.194 kg)  BMI 33.84 kg/m2 , General: Alert, oriented x3, no distress, mild to moderately obese Head: no evidence of trauma, PERRL, EOMI, no exophtalmos or lid lag, no myxedema, no xanthelasma; normal ears, nose and oropharynx Neck: normal jugular venous pulsations and no hepatojugular reflux; brisk carotid pulses without delay and no carotid bruits Chest: clear to auscultation, no signs of consolidation by percussion or palpation, normal fremitus, symmetrical and full respiratory excursions Cardiovascular: normal position and quality of the apical  impulse, regular rhythm, normal first and second heart sounds, no murmurs, rubs or gallops Abdomen: no tenderness or distention, no masses by palpation, no abnormal pulsatility or arterial bruits, normal bowel sounds, no hepatosplenomegaly Extremities: no clubbing, cyanosis or edema; 2+ radial, ulnar and brachial pulses bilaterally; 2+ right femoral, posterior tibial and dorsalis pedis pulses; 2+ left femoral, posterior tibial and dorsalis pedis pulses; no subclavian or femoral bruits Neurological: grossly nonfocal   EKG: Sinus Bradycardia, otherwise normal  Lipid Panel     Component Value Date/Time   CHOL 166 12/07/2012 1210   TRIG 124 12/07/2012 1210   HDL 42 12/07/2012 1210   CHOLHDL 4.0 12/07/2012 1210   VLDL 25 12/07/2012 1210   LDLCALC 99 12/07/2012 1210    BMET    Component Value Date/Time   NA 140 12/07/2012 1210   K 4.5 12/07/2012 1210   CL 103 12/07/2012 1210   CO2 30 12/07/2012 1210   GLUCOSE 84 12/07/2012 1210   BUN 23 12/07/2012 1210   CREATININE 1.16* 12/07/2012 1210   CREATININE 0.84 11/09/2011 1348   CALCIUM 9.7 12/07/2012 1210   GFRNONAA 66* 11/09/2011 1348   GFRAA 77* 11/09/2011 1348     ASSESSMENT AND PLAN Coronary vasospasm Her more recent episodes of right upper quadrant discomfort are very different from previous episodes of angina, including the discomfort that was retrosternal pressure-like when she had her non-ST segment elevation myocardial infarction. She has had a cholecystectomy already.  HYPERTENSION Control is fair, but I would like to stop her beta blocker since it may be causing her fatigue. Of note she cannot  take ACE inhibitors and gentamicin receptor blockers due to history of angioedema. She is already on a full dose of amlodipine and this should be continued as empirical therapy for coronary vasospasm. If additional hypertensives are necessary I probably chooses diuretic.  FATIGUE She is not hypothyroid at this time. Her symptoms may be related to sinus bradycardia and  this in turn may be secondary to beta blockers. I have asked her to wean off the Toprol by cutting the dose in half for 3 or 4 days and then discontinuing altogether. After the beta blockers are out of her system I recommend that she wear a 24-hour Holter monitor so that we can evaluate her for chronotropic incompetence.  HYPERLIPIDEMIA Current levels are satisfactory. She has been on statins for very many years and I do not think her current fatigue can be attributed to these medications. No changes are recommended.  Depression Have gently explored whether depression may not be the cause of her fatigue. Is in a long time since her father has passed away and she still is marked by these events. I wonder she feels rather "pointless" and "hopeless" now of she has lost her primary role is caregiver for her parents.      Junious Silk, MD, [facility] [phone] office [phone] pager

## 2013-02-07 NOTE — Patient Instructions (Addendum)
Your physician has recommended you make the following change in your medication: Cut Metoprolol in half for three days then D/C all together. Your physician recommends that you schedule a follow-up in 1 week to have your 24 hour holter monitor placed. Your physician has recommended that you wear a holter monitor. Holter monitors are medical devices that record the heart's electrical activity. Doctors most often use these monitors to diagnose arrhythmias. Arrhythmias are problems with the speed or rhythm of the heartbeat. The monitor is a small, portable device. You can wear one while you do your normal daily activities. This is usually used to diagnose what is causing palpitations/syncope (passing out). Your physician recommends that you schedule a follow-up appointment in: 3 months

## 2013-02-08 ENCOUNTER — Encounter: Payer: Self-pay | Admitting: Family Medicine

## 2013-02-19 ENCOUNTER — Ambulatory Visit (INDEPENDENT_AMBULATORY_CARE_PROVIDER_SITE_OTHER): Payer: Managed Care, Other (non HMO) | Admitting: Pharmacist Clinician (PhC)/ Clinical Pharmacy Specialist

## 2013-02-19 ENCOUNTER — Encounter: Payer: Self-pay | Admitting: Pharmacist Clinician (PhC)/ Clinical Pharmacy Specialist

## 2013-02-19 VITALS — BP 116/80 | HR 76

## 2013-02-19 DIAGNOSIS — I10 Essential (primary) hypertension: Secondary | ICD-10-CM

## 2013-02-19 NOTE — Patient Instructions (Addendum)
Your blood pressure today is excellent at 118/80. Check your blood pressure at home twice a week (if able) and keep record of the readings.  Take your BP meds as follows:   PM: hydrochlorothiazde 12.5 mg and amlodipine 10 mg.    Bring all of your meds, your BP cuff and your record of home blood pressures to your next appointment.  Exercise as you're able, try to walk approximately 30 minutes per day.  Keep salt intake to a minimum, especially watch canned and prepared boxed foods.  Eat more fresh fruits and vegetables and fewer canned items.  Avoid eating in fast food restaurants.    HOW TO TAKE YOUR BLOOD PRESSURE:   Rest 5 minutes before taking your blood pressure.    Don't smoke or drink caffeinated beverages for at least 30 minutes before.   Take your blood pressure before (not after) you eat.   Sit comfortably with your back supported and both feet on the floor (don't cross your legs).   Elevate your arm to heart level on a table or a desk.   Use the proper sized cuff. It should fit smoothly and snugly around your bare upper arm. There should be enough room to slip a fingertip under the cuff. The bottom edge of the cuff should be 1 inch above the crease of the elbow.   Ideally, take 3 measurements at one sitting and record the average.

## 2013-02-19 NOTE — Progress Notes (Signed)
HPI:  [name] is a 79 y.o. female with a PMH below who presents today for a blood pressure check.  She saw Dr. Royann Shivers approximately 1 week ago and he discontinued her metoprolol due to suspected fatigue.  Patient presents today feeling much better, with a definite improvement in energy levels.  She is currently taking amlodipine and HCTZ for BP control and in the past has had a severe reaction to ACEI and ARBs.  She denies alcohol or tobacco use, drinks a minimal amount of caffeine and follows a low sodium diet.  She states that both of her parents had hypertension problems, has no siblings, and none of her children have hypertension at this time.     Current Outpatient Prescriptions  Medication Sig Dispense Refill  . amLODipine (NORVASC) 10 MG tablet Dose increase effective 03/07/2012, discontinue amlodipine 5mg  tablets  30 tablet  11  . Ascorbic Acid (VITAMIN C) 500 MG tablet Take 500 mg by mouth daily.       Marland Kitchen aspirin 81 MG tablet Take 81 mg by mouth daily.      Marland Kitchen atorvastatin (LIPITOR) 20 MG tablet take 1 tablet by mouth at bedtime  30 tablet  5  . calcium carbonate (OS-CAL - DOSED IN MG OF ELEMENTAL CALCIUM) 1250 MG tablet Take 1 tablet by mouth daily.      . diclofenac sodium (VOLTAREN) 1 % GEL Apply 1 application topically 4 (four) times daily.  100 g  4  . EPINEPHrine (EPIPEN 2-PAK) 0.3 mg/0.3 mL DEVI Inject 0.3 mLs (0.3 mg total) into the muscle once as needed. For severe allergic reactions  1 Device  1  . ferrous sulfate 325 (65 FE) MG tablet Take 325 mg by mouth daily with breakfast.       . FLUoxetine (PROZAC) 20 MG capsule Take 1 capsule (20 mg total) by mouth daily.  30 capsule  5  . Fluoxetine HCl, PMDD, 10 MG TABS Take 1 tablet (10 mg total) by mouth daily.  30 tablet  3  . hydrochlorothiazide (MICROZIDE) 12.5 MG capsule take 1 capsule by mouth once daily  30 capsule  5  . HYDROcodone-acetaminophen (NORCO) 7.5-325 MG per tablet Take 1 tablet by mouth 4 (four) times  daily.  120 tablet  0  . levothyroxine (LEVOTHROID) 25 MCG tablet Take 1 tablet (25 mcg total) by mouth daily.  90 tablet  1  . metoprolol tartrate (LOPRESSOR) 25 MG tablet take 1 tablet by mouth twice a day  60 tablet  5  . Multiple Vitamin (MULITIVITAMIN WITH MINERALS) TABS Take 1 tablet by mouth daily.      . nitroGLYCERIN (NITROSTAT) 0.4 MG SL tablet Place 0.4 mg under the tongue every 5 (five) minutes as needed. For chest pain      . omeprazole (PRILOSEC) 40 MG capsule take 1 capsule by mouth once daily  30 capsule  5  . potassium chloride (KLOR-CON 10) 10 MEQ tablet take 1 tablet by mouth twice a day  60 tablet  5  . pyridoxine (B-6) 200 MG tablet Take 200 mg by mouth 2 (two) times daily.       Marland Kitchen ZOVIRAX 5 % APPLY TOPICALLY EVERY 3 HOURS.  30 g  1   Current Facility-Administered Medications  Medication Dose Route Frequency Provider Last Rate Last Dose  . Influenza (>/= 3 years) inactive virus vaccine (FLVIRIN/FLUZONE) injection SUSP 0.5 mL  0.5 mL Intramuscular Once [name], MD  Allergies  Allergen Reactions  . Ace Inhibitors Anaphylaxis    Was on ventilator for six days due to airway swelling shut while on ACE inhibitors.  . Bupropion Hcl Anaphylaxis  . Codeine Anaphylaxis  . Penicillins Anaphylaxis  . Angiotensin Receptor Blockers     Patient unable to recall what medication this might have been and/or her reaction to it.    Past Medical History  Diagnosis Date  . Hyperlipidemia     takes Lipitor nightly  . Obesity   . DJD (degenerative joint disease)     Severe  . CAD (coronary artery disease)   . Chronic back pain   . Carpal tunnel syndrome on left     Left Hand pain with numbness, new onset   . Difficult intubation     small airway  . Hypertension     takes Amlodipine and Metoprolol daily  . Blood clot associated with vein wall inflammation     hx of in right leg  . Sleep apnea     sleep study done 31yrs ago  . Shortness of breath     with  exertion  . Pneumonia     hx of-in 2011  . Headache(784.0)     occasionally  . Chronic back pain     buldging disc  . GERD (gastroesophageal reflux disease)     takes Omeprazole daily  . Gastric ulcer     hx of   . Constipation   . Diarrhea   . Nocturia   . Anemia   . Thyroid mass     right    BP 116/80  Pulse 76 (standing)        118/80  (seated)   ASSESSMENT AND PLAN: Pt is normotensive today at her appointment.  There was concern that stopping her beta blocker (metoprolol) may cause and increase in blood pressure, but she is doing well.  I have asked her to monitor her BP twice a week, watching for an increase in pressures to >140/90.  If she notices a trend upward she is to call the office for follow up.    [name], RPH-CPP

## 2013-03-20 ENCOUNTER — Other Ambulatory Visit: Payer: Self-pay | Admitting: Family Medicine

## 2013-04-03 ENCOUNTER — Other Ambulatory Visit: Payer: Self-pay

## 2013-04-03 MED ORDER — HYDROCODONE-ACETAMINOPHEN 7.5-325 MG PO TABS: 1.0000 | ORAL_TABLET | Freq: Four times a day (QID) | ORAL | Status: DC

## 2013-04-11 LAB — BASIC METABOLIC PANEL
BUN: 16 mg/dL (ref 6–23)
CO2: 32 mEq/L (ref 19–32)
Calcium: 9.6 mg/dL (ref 8.4–10.5)
Chloride: 100 mEq/L (ref 96–112)
Creat: 1.1 mg/dL (ref 0.50–1.10)
Glucose, Bld: 86 mg/dL (ref 70–99)
Potassium: 4.5 mEq/L (ref 3.5–5.3)
Sodium: 139 mEq/L (ref 135–145)

## 2013-04-11 LAB — HEMOGLOBIN A1C
Hgb A1c MFr Bld: 5.8 % — ABNORMAL HIGH (ref <5.7)
Mean Plasma Glucose: 120 mg/dL — ABNORMAL HIGH (ref <117)

## 2013-04-17 ENCOUNTER — Ambulatory Visit (INDEPENDENT_AMBULATORY_CARE_PROVIDER_SITE_OTHER): Payer: Managed Care, Other (non HMO) | Admitting: Family Medicine

## 2013-04-17 ENCOUNTER — Encounter: Payer: Self-pay | Admitting: Family Medicine

## 2013-04-17 VITALS — BP 128/80 | HR 76 | Resp 18 | Wt 171.0 lb

## 2013-04-17 DIAGNOSIS — E785 Hyperlipidemia, unspecified: Secondary | ICD-10-CM

## 2013-04-17 DIAGNOSIS — M1990 Unspecified osteoarthritis, unspecified site: Secondary | ICD-10-CM | POA: Insufficient documentation

## 2013-04-17 DIAGNOSIS — F329 Major depressive disorder, single episode, unspecified: Secondary | ICD-10-CM

## 2013-04-17 DIAGNOSIS — F3289 Other specified depressive episodes: Secondary | ICD-10-CM

## 2013-04-17 DIAGNOSIS — F32A Depression, unspecified: Secondary | ICD-10-CM

## 2013-04-17 DIAGNOSIS — R7309 Other abnormal glucose: Secondary | ICD-10-CM

## 2013-04-17 DIAGNOSIS — R5383 Other fatigue: Secondary | ICD-10-CM

## 2013-04-17 DIAGNOSIS — R7303 Prediabetes: Secondary | ICD-10-CM

## 2013-04-17 DIAGNOSIS — K219 Gastro-esophageal reflux disease without esophagitis: Secondary | ICD-10-CM

## 2013-04-17 DIAGNOSIS — M79609 Pain in unspecified limb: Secondary | ICD-10-CM

## 2013-04-17 DIAGNOSIS — M25561 Pain in right knee: Secondary | ICD-10-CM

## 2013-04-17 DIAGNOSIS — M79672 Pain in left foot: Secondary | ICD-10-CM

## 2013-04-17 DIAGNOSIS — R5381 Other malaise: Secondary | ICD-10-CM

## 2013-04-17 DIAGNOSIS — M25569 Pain in unspecified knee: Secondary | ICD-10-CM

## 2013-04-17 DIAGNOSIS — I10 Essential (primary) hypertension: Secondary | ICD-10-CM

## 2013-04-17 DIAGNOSIS — IMO0002 Reserved for concepts with insufficient information to code with codable children: Secondary | ICD-10-CM

## 2013-04-17 DIAGNOSIS — E039 Hypothyroidism, unspecified: Secondary | ICD-10-CM

## 2013-04-17 MED ORDER — HYDROCODONE-ACETAMINOPHEN 7.5-325 MG PO TABS: 1.0000 | ORAL_TABLET | Freq: Four times a day (QID) | ORAL | Status: DC

## 2013-04-17 NOTE — Patient Instructions (Addendum)
Annual wellness December 12 or after, please call if you need me before  Flu vaccine available in October , call for appt please  You are doing extremely well except for the aches and pains.  You are referred for an epidural as well as to see the orthopedic Doctor  Fasting lipid, cmp , HBA1C and TSH in December before next visit

## 2013-04-17 NOTE — Progress Notes (Signed)
  Subjective:    Patient ID: [name], female    DOB: 02-07-1934, 79 y.o.   MRN: [id number]  HPI The PT is here for follow up and re-evaluation of chronic medical conditions, medication management and review of any available recent lab and radiology data.  Preventive health is updated, specifically  Cancer screening and Immunization.   . The PT denies any adverse reactions to current medications since the last visit.Feels significantly better on higher dose prozac, enjoying life again, eating less with excellent weight loss and improved blood suggar  C/o uncontrolled back pain, has benefited from epidurals and requests same. Knee pain since fall last December, worsening, actually awakens pt, needs ortho eval    Review of Systems See HPI Denies recent fever or chills. Denies sinus pressure, nasal congestion, ear pain or sore throat. Denies chest congestion, productive cough or wheezing. Denies chest pains, palpitations and leg swelling Denies abdominal pain, nausea, vomiting,diarrhea or constipation.   Denies dysuria, frequency, hesitancy or incontinence.  Denies headaches, seizures, numbness, or tingling. Denies uncontrolled depression, anxiety or insomnia. Denies skin break down or rash.        Objective:   Physical Exam  Patient alert and oriented and in no cardiopulmonary distress.  HEENT: No facial asymmetry, EOMI, no sinus tenderness,  oropharynx pink and moist.  Neck supple no adenopathy.  Chest: Clear to auscultation bilaterally.  CVS: S1, S2 no murmurs, no S3.  ABD: Soft non tender. Bowel sounds normal.  Ext: No edema  MS: decreased  ROM spine, adequate in shoulders and  hips and reduced in knee.  Skin: Intact, no ulcerations or rash noted.  Psych: Good eye contact, normal affect. Memory intact not anxious or depressed appearing.  CNS: CN 2-12 intact, power, tone and sensation normal throughout.       Assessment & Plan:

## 2013-04-18 ENCOUNTER — Other Ambulatory Visit: Payer: Self-pay | Admitting: Family Medicine

## 2013-04-18 DIAGNOSIS — M549 Dorsalgia, unspecified: Secondary | ICD-10-CM

## 2013-04-23 ENCOUNTER — Ambulatory Visit
Admission: RE | Admit: 2013-04-23 | Discharge: 2013-04-23 | Disposition: A | Discharge status: Home / Self Care | Payer: Managed Care, Other (non HMO) | Source: Ambulatory Visit | Attending: Family Medicine | Admitting: Family Medicine

## 2013-04-23 ENCOUNTER — Other Ambulatory Visit: Payer: Self-pay | Admitting: Family Medicine

## 2013-04-23 VITALS — BP 144/85 | HR 88

## 2013-04-23 DIAGNOSIS — M549 Dorsalgia, unspecified: Secondary | ICD-10-CM

## 2013-04-23 MED ORDER — METHYLPREDNISOLONE ACETATE 40 MG/ML INJ SUSP (RADIOLOG
120.0000 mg | Freq: Once | INTRAMUSCULAR | Status: AC
  Administered 2013-04-23: 120 mg via EPIDURAL

## 2013-04-23 MED ORDER — IOHEXOL 180 MG/ML  SOLN
1.0000 mL | Freq: Once | INTRAMUSCULAR | Status: AC | PRN
  Administered 2013-04-23: 1 mL via EPIDURAL

## 2013-04-23 NOTE — Discharge Instructions (Signed)

## 2013-04-30 NOTE — Assessment & Plan Note (Signed)
Uncontrolled , refer for epidural

## 2013-04-30 NOTE — Assessment & Plan Note (Signed)
Controlled, no change in medication  

## 2013-04-30 NOTE — Assessment & Plan Note (Signed)
Marked improvement reduced to 5.8 in 04/2013 Pt applauded on this and encouraged to continue lifestyle change

## 2013-04-30 NOTE — Assessment & Plan Note (Signed)
Marked improvement, pt to remain on current medication

## 2013-04-30 NOTE — Assessment & Plan Note (Signed)
Uncontrolled, needs ortho  eval referral entered

## 2013-04-30 NOTE — Assessment & Plan Note (Signed)
Controlled, no change in medication Hyperlipidemia:Low fat diet discussed and encouraged.  Updated lab next visit 

## 2013-05-15 ENCOUNTER — Telehealth: Payer: Self-pay | Admitting: Family Medicine

## 2013-05-15 NOTE — Telephone Encounter (Signed)
Patient wants Dr Lodema Hong to continue to write her pain medication. Does not want Dr Netta Corrigan to do so.

## 2013-05-15 NOTE — Telephone Encounter (Signed)
Keep doing this for her

## 2013-05-20 ENCOUNTER — Encounter: Payer: Self-pay | Admitting: *Deleted

## 2013-05-21 ENCOUNTER — Encounter: Payer: Self-pay | Admitting: Cardiovascular Disease

## 2013-05-21 ENCOUNTER — Ambulatory Visit (INDEPENDENT_AMBULATORY_CARE_PROVIDER_SITE_OTHER): Payer: Managed Care, Other (non HMO) | Admitting: Cardiovascular Disease

## 2013-05-21 VITALS — BP 128/88 | HR 74 | Resp 20 | Ht 61.0 in | Wt 166.5 lb

## 2013-05-21 DIAGNOSIS — I201 Angina pectoris with documented spasm: Secondary | ICD-10-CM

## 2013-05-21 DIAGNOSIS — I2510 Atherosclerotic heart disease of native coronary artery without angina pectoris: Secondary | ICD-10-CM

## 2013-05-21 DIAGNOSIS — I10 Essential (primary) hypertension: Secondary | ICD-10-CM

## 2013-05-21 DIAGNOSIS — E785 Hyperlipidemia, unspecified: Secondary | ICD-10-CM

## 2013-05-21 NOTE — Progress Notes (Signed)
Patient ID: [name], female   DOB: 01-17-1934, 79 y.o.   MRN: [id number]     Reason for office visit Coronary atherosclerosis/vasospastic angina  Mrs. [name] has not had any meal coronary or cardiac problems since her last appointment a year ago. Shows a lot of problem with lumbar spine disease and right knee arthritis. She has had a previous lumbar spine fusion and feels that she is severely limited by low back pain.  A new medication recently started by her orthopedic surgeon lead to significant improvement in her quality of life, she does not recall this medication was but it is a green capsule, possibly indomethacin. She may require an epidural injection. She denies problems with chest pain or shortness of breath but is quite inactive. Denies edema, dizziness or syncope    Allergies  Allergen Reactions  . Ace Inhibitors Anaphylaxis    Was on ventilator for six days due to airway swelling shut while on ACE inhibitors.  . Bupropion Hcl Anaphylaxis  . Codeine Anaphylaxis  . Penicillins Anaphylaxis  . Angiotensin Receptor Blockers     Patient unable to recall what medication this might have been and/or her reaction to it.    Current Outpatient Prescriptions  Medication Sig Dispense Refill  . amLODipine (NORVASC) 10 MG tablet take 1 tablet by mouth once daily  30 tablet  11  . Ascorbic Acid (VITAMIN C) 500 MG tablet Take 500 mg by mouth daily.       Marland Kitchen aspirin 81 MG tablet Take 81 mg by mouth daily.      Marland Kitchen atorvastatin (LIPITOR) 20 MG tablet take 1 tablet by mouth at bedtime  30 tablet  5  . calcium carbonate (OS-CAL - DOSED IN MG OF ELEMENTAL CALCIUM) 1250 MG tablet Take 1 tablet by mouth daily.      . diclofenac sodium (VOLTAREN) 1 % GEL Apply 1 application topically 4 (four) times daily.  100 g  4  . EPINEPHrine (EPIPEN 2-PAK) 0.3 mg/0.3 mL DEVI Inject 0.3 mLs (0.3 mg total) into the muscle once as needed. For severe allergic reactions  1 Device  1  . ferrous sulfate 325  (65 FE) MG tablet Take 325 mg by mouth daily with breakfast.       . FLUoxetine (PROZAC) 20 MG capsule Take 1 capsule (20 mg total) by mouth daily.  30 capsule  5  . hydrochlorothiazide (MICROZIDE) 12.5 MG capsule take 1 capsule by mouth once daily  30 capsule  5  . HYDROcodone-acetaminophen (NORCO) 7.5-325 MG per tablet Take 1 tablet by mouth 4 (four) times daily.  120 tablet  2  . levothyroxine (LEVOTHROID) 25 MCG tablet Take 1 tablet (25 mcg total) by mouth daily.  90 tablet  1  . Multiple Vitamin (MULITIVITAMIN WITH MINERALS) TABS Take 1 tablet by mouth daily.      . nitroGLYCERIN (NITROSTAT) 0.4 MG SL tablet Place 0.4 mg under the tongue every 5 (five) minutes as needed. For chest pain      . omeprazole (PRILOSEC) 40 MG capsule take 1 capsule by mouth once daily  30 capsule  5  . potassium chloride (KLOR-CON 10) 10 MEQ tablet take 1 tablet by mouth twice a day  60 tablet  5  . pyridoxine (B-6) 200 MG tablet Take 200 mg by mouth 2 (two) times daily.       Marland Kitchen ZOVIRAX 5 % APPLY TOPICALLY EVERY 3 HOURS.  30 g  1   Current Facility-Administered Medications  Medication  Dose Route Frequency Provider Last Rate Last Dose  . Influenza (>/= 3 years) inactive virus vaccine (FLVIRIN/FLUZONE) injection SUSP 0.5 mL  0.5 mL Intramuscular Once [name], MD        Past Medical History  Diagnosis Date  . Hyperlipidemia     takes Lipitor nightly  . Obesity   . DJD (degenerative joint disease)     Severe  . CAD (coronary artery disease)   . Chronic back pain   . Carpal tunnel syndrome on left     Left Hand pain with numbness, new onset   . Difficult intubation     small airway  . Hypertension     takes Amlodipine and Metoprolol daily  . Blood clot associated with vein wall inflammation     hx of in right leg  . Sleep apnea     sleep study done 17yrs ago  . Shortness of breath     with exertion  . Pneumonia     hx of-in 2011  . Headache(784.0)     occasionally  . Chronic back pain       buldging disc  . GERD (gastroesophageal reflux disease)     takes Omeprazole daily  . Gastric ulcer     hx of   . Constipation   . Diarrhea   . Nocturia   . Anemia   . Thyroid mass     right    Past Surgical History  Procedure Laterality Date  . Appendectomy  1987  . Carpal tunnel release right      x 3  . Partial hysterectomy  1972  . Arthroscopy left shoulder  1999  . Arthoscopy left knee  1992  . Back  surgery  lumbar  1989 /1999    x 2  . Tonsillectomy    . Rotator cuff repair      right  . Colonoscopy    . Esophagogastroduodenoscopy    . Cholecystectomy    . Thyroidectomy  11/17/2011    Procedure: THYROIDECTOMY;  Surgeon: Darletta Moll, MD;  Location: [facility] OR;  Service: ENT;  Laterality: Right;  WITH FROZEN SECTION  . Cardiac catheterization  05/2010  . Abdominal hysterectomy    . Back surgery    . Nm myocar perf wall motion  11/12/2008    no ischemia    Family History  Problem Relation Age of Onset  . Anuerysm Mother   . Anesthesia problems Neg Hx   . Hypotension Neg Hx   . Malignant hyperthermia Neg Hx   . Pseudochol deficiency Neg Hx     History   Social History  . Marital Status: Widowed    Spouse Name: N/A    Number of Children: 4  . Years of Education: N/A   Occupational History  .     Social History Main Topics  . Smoking status: Former Smoker    Types: Cigarettes    Quit date: 09/06/1984  . Smokeless tobacco: Not on file  . Alcohol Use: No     Comment: Casual  . Drug Use: No  . Sexual Activity: No   Other Topics Concern  . Not on file   Social History Narrative  . No narrative on file    Review of systems: The patient specifically denies any chest pain at rest or with exertion, dyspnea at rest or with exertion, orthopnea, paroxysmal nocturnal dyspnea, syncope, palpitations, focal neurological deficits, intermittent claudication, lower extremity edema, unexplained weight gain, cough, hemoptysis or  wheezing.  The patient also denies  abdominal pain, nausea, vomiting, dysphagia, diarrhea, constipation, polyuria, polydipsia, dysuria, hematuria, frequency, urgency, abnormal bleeding or bruising, fever, chills, unexpected weight changes, mood swings, change in skin or hair texture, change in voice quality, auditory or visual problems, allergic reactions or rashes, new musculoskeletal complaints other than those described above   PHYSICAL EXAM BP 128/88  Pulse 74  Resp 20  Ht 5\' 1"  (1.549 m)  Wt 166 lb 8 oz (75.524 kg)  BMI 31.48 kg/m2  General: Alert, oriented x3, no distress Head: no evidence of trauma, PERRL, EOMI, no exophtalmos or lid lag, no myxedema, no xanthelasma; normal ears, nose and oropharynx Neck: normal jugular venous pulsations and no hepatojugular reflux; brisk carotid pulses without delay and no carotid bruits Chest: clear to auscultation, no signs of consolidation by percussion or palpation, normal fremitus, symmetrical and full respiratory excursions Cardiovascular: normal position and quality of the apical impulse, regular rhythm, normal first and second heart sounds, no murmurs, rubs or gallops Abdomen: no tenderness or distention, no masses by palpation, no abnormal pulsatility or arterial bruits, normal bowel sounds, no hepatosplenomegaly Extremities: no clubbing, cyanosis or edema; 2+ radial, ulnar and brachial pulses bilaterally; 2+ right femoral, posterior tibial and dorsalis pedis pulses; 2+ left femoral, posterior tibial and dorsalis pedis pulses; no subclavian or femoral bruits Neurological: grossly nonfocal   EKG: Normal sinus rhythm, normal tracing  Lipid Panel     Component Value Date/Time   CHOL 166 12/07/2012 1210   TRIG 124 12/07/2012 1210   HDL 42 12/07/2012 1210   CHOLHDL 4.0 12/07/2012 1210   VLDL 25 12/07/2012 1210   LDLCALC 99 12/07/2012 1210    BMET    Component Value Date/Time   NA 139 04/11/2013 1230   K 4.5 04/11/2013 1230   CL 100 04/11/2013 1230   CO2 32 04/11/2013 1230   GLUCOSE 86  04/11/2013 1230   BUN 16 04/11/2013 1230   CREATININE 1.10 04/11/2013 1230   CREATININE 0.84 11/09/2011 1348   CALCIUM 9.6 04/11/2013 1230   GFRNONAA 66* 11/09/2011 1348   GFRAA 77* 11/09/2011 1348     ASSESSMENT AND PLAN CAD She had only minor coronary atherosclerosis at the time of cardiac catheterization in 2008, but had biochemical evidence of a small myocardial infarction. Coronary vasospasm with suspected. 30-40% lesions were seen scattered throughout the coronary tree especially prominent in the mid right coronary and distal left circumflex coronary artery. Normal left ventricular systolic function. Normal 2010 nuclear perfusion study. Focuses on risk factor modification.  HYPERTENSION Good control on current medications no changes are planned  HYPERLIPIDEMIA Satisfactory lipid profile  Coronary vasospasm This is a presumptive diagnosis.  Obesity Orders Placed This Encounter  Procedures  . EKG 12-Lead   No orders of the defined types were placed in this encounter.    Junious Silk, MD, [facility] [phone] office [phone] pager

## 2013-05-21 NOTE — Patient Instructions (Addendum)
Dr. Salena Saner wants you to follow-up in 1 year. You will receive a reminder letter in the mail two months in advance. If you don't receive a letter, please call our office to schedule the follow-up appointment.

## 2013-05-21 NOTE — Assessment & Plan Note (Signed)
She had only minor coronary atherosclerosis at the time of cardiac catheterization in 2008, but had biochemical evidence of a small myocardial infarction. Coronary vasospasm with suspected. 30-40% lesions were seen scattered throughout the coronary tree especially prominent in the mid right coronary and distal left circumflex coronary artery. Normal left ventricular systolic function. Normal 2010 nuclear perfusion study. Focuses on risk factor modification.

## 2013-05-21 NOTE — Assessment & Plan Note (Signed)
This is a presumptive diagnosis.

## 2013-05-21 NOTE — Assessment & Plan Note (Signed)
Satisfactory lipid profile. 

## 2013-05-21 NOTE — Assessment & Plan Note (Signed)
Good control on current medications no changes are planned

## 2013-05-30 ENCOUNTER — Other Ambulatory Visit: Payer: Self-pay | Admitting: Family Medicine

## 2013-06-01 ENCOUNTER — Telehealth: Payer: Self-pay | Admitting: Family Medicine

## 2013-06-01 NOTE — Telephone Encounter (Signed)
Patient aware she already has a refill at the pharmacy

## 2013-06-22 ENCOUNTER — Encounter (INDEPENDENT_AMBULATORY_CARE_PROVIDER_SITE_OTHER): Payer: Self-pay

## 2013-06-22 ENCOUNTER — Ambulatory Visit (INDEPENDENT_AMBULATORY_CARE_PROVIDER_SITE_OTHER): Payer: Managed Care, Other (non HMO)

## 2013-06-22 DIAGNOSIS — Z23 Encounter for immunization: Secondary | ICD-10-CM

## 2013-06-26 ENCOUNTER — Other Ambulatory Visit: Payer: Self-pay | Admitting: Family Medicine

## 2013-07-14 ENCOUNTER — Emergency Department (HOSPITAL_COMMUNITY)
Admission: EM | Admit: 2013-07-14 | Discharge: 2013-07-14 | Disposition: A | Discharge status: Home / Self Care | Payer: Medicare HMO | Attending: Emergency Medicine | Admitting: Emergency Medicine

## 2013-07-14 ENCOUNTER — Encounter (HOSPITAL_COMMUNITY): Payer: Self-pay | Admitting: Emergency Medicine

## 2013-07-14 DIAGNOSIS — D649 Anemia, unspecified: Secondary | ICD-10-CM | POA: Insufficient documentation

## 2013-07-14 DIAGNOSIS — S01309A Unspecified open wound of unspecified ear, initial encounter: Secondary | ICD-10-CM | POA: Insufficient documentation

## 2013-07-14 DIAGNOSIS — Z23 Encounter for immunization: Secondary | ICD-10-CM | POA: Insufficient documentation

## 2013-07-14 DIAGNOSIS — E669 Obesity, unspecified: Secondary | ICD-10-CM | POA: Insufficient documentation

## 2013-07-14 DIAGNOSIS — I2510 Atherosclerotic heart disease of native coronary artery without angina pectoris: Secondary | ICD-10-CM | POA: Insufficient documentation

## 2013-07-14 DIAGNOSIS — Y92009 Unspecified place in unspecified non-institutional (private) residence as the place of occurrence of the external cause: Secondary | ICD-10-CM | POA: Insufficient documentation

## 2013-07-14 DIAGNOSIS — Y9389 Activity, other specified: Secondary | ICD-10-CM | POA: Insufficient documentation

## 2013-07-14 DIAGNOSIS — Z7982 Long term (current) use of aspirin: Secondary | ICD-10-CM | POA: Insufficient documentation

## 2013-07-14 DIAGNOSIS — Z8701 Personal history of pneumonia (recurrent): Secondary | ICD-10-CM | POA: Insufficient documentation

## 2013-07-14 DIAGNOSIS — I10 Essential (primary) hypertension: Secondary | ICD-10-CM | POA: Insufficient documentation

## 2013-07-14 DIAGNOSIS — Z8739 Personal history of other diseases of the musculoskeletal system and connective tissue: Secondary | ICD-10-CM | POA: Insufficient documentation

## 2013-07-14 DIAGNOSIS — E785 Hyperlipidemia, unspecified: Secondary | ICD-10-CM | POA: Insufficient documentation

## 2013-07-14 DIAGNOSIS — Z95818 Presence of other cardiac implants and grafts: Secondary | ICD-10-CM | POA: Insufficient documentation

## 2013-07-14 DIAGNOSIS — Z880 Allergy status to penicillin: Secondary | ICD-10-CM | POA: Insufficient documentation

## 2013-07-14 DIAGNOSIS — G8929 Other chronic pain: Secondary | ICD-10-CM | POA: Insufficient documentation

## 2013-07-14 DIAGNOSIS — K219 Gastro-esophageal reflux disease without esophagitis: Secondary | ICD-10-CM | POA: Insufficient documentation

## 2013-07-14 DIAGNOSIS — Z86718 Personal history of other venous thrombosis and embolism: Secondary | ICD-10-CM | POA: Insufficient documentation

## 2013-07-14 DIAGNOSIS — Z791 Long term (current) use of non-steroidal anti-inflammatories (NSAID): Secondary | ICD-10-CM | POA: Insufficient documentation

## 2013-07-14 DIAGNOSIS — S01311A Laceration without foreign body of right ear, initial encounter: Secondary | ICD-10-CM

## 2013-07-14 DIAGNOSIS — E079 Disorder of thyroid, unspecified: Secondary | ICD-10-CM | POA: Insufficient documentation

## 2013-07-14 DIAGNOSIS — Z79899 Other long term (current) drug therapy: Secondary | ICD-10-CM | POA: Insufficient documentation

## 2013-07-14 DIAGNOSIS — Z87891 Personal history of nicotine dependence: Secondary | ICD-10-CM | POA: Insufficient documentation

## 2013-07-14 DIAGNOSIS — W208XXA Other cause of strike by thrown, projected or falling object, initial encounter: Secondary | ICD-10-CM | POA: Insufficient documentation

## 2013-07-14 DIAGNOSIS — Z8669 Personal history of other diseases of the nervous system and sense organs: Secondary | ICD-10-CM | POA: Insufficient documentation

## 2013-07-14 MED ORDER — TETANUS-DIPHTH-ACELL PERTUSSIS 5-2.5-18.5 LF-MCG/0.5 IM SUSP
INTRAMUSCULAR | Status: AC
  Filled 2013-07-14: qty 0.5

## 2013-07-14 MED ORDER — TETANUS-DIPHTH-ACELL PERTUSSIS 5-2.5-18.5 LF-MCG/0.5 IM SUSP
0.5000 mL | Freq: Once | INTRAMUSCULAR | Status: AC
  Administered 2013-07-14: 0.5 mL via INTRAMUSCULAR

## 2013-07-14 NOTE — ED Provider Notes (Signed)
CSN: [id number]     Arrival date & time 07/14/13  1656 History   First MD Initiated Contact with Patient 07/14/13 1815     Chief Complaint  Patient presents with  . Ear Laceration   (Consider location/radiation/quality/duration/timing/severity/associated sxs/prior Treatment) Patient is a 79 y.o. female presenting with skin laceration. The history is provided by the patient.  Laceration Location:  Head/neck Head/neck laceration location:  R ear Depth:  Cutaneous Quality: straight   Bleeding: controlled   Time since incident:  1 hour Injury mechanism: picture frame, wooden. Pain details:    Quality:  Burning   Severity:  Mild   Progression:  Improving Foreign body present:  No foreign bodies Relieved by:  None tried Tetanus status:  Unknown  [name] is a 79 y.o. female who presents to the ED with a laceration to the right ear. She was standing near a wall in her home and a picture fell off the wall and the frame hit her ear as it was falling causing the cut. She denies any other injuries.   Past Medical History  Diagnosis Date  . Hyperlipidemia     takes Lipitor nightly  . Obesity   . DJD (degenerative joint disease)     Severe  . CAD (coronary artery disease)   . Chronic back pain   . Carpal tunnel syndrome on left     Left Hand pain with numbness, new onset   . Difficult intubation     small airway  . Hypertension     takes Amlodipine and Metoprolol daily  . Blood clot associated with vein wall inflammation     hx of in right leg  . Sleep apnea     sleep study done 54yrs ago  . Shortness of breath     with exertion  . Pneumonia     hx of-in 2011  . Headache(784.0)     occasionally  . Chronic back pain     buldging disc  . GERD (gastroesophageal reflux disease)     takes Omeprazole daily  . Gastric ulcer     hx of   . Constipation   . Diarrhea   . Nocturia   . Anemia   . Thyroid mass     right   Past Surgical History  Procedure Laterality  Date  . Appendectomy  1987  . Carpal tunnel release right      x 3  . Partial hysterectomy  1972  . Arthroscopy left shoulder  1999  . Arthoscopy left knee  1992  . Back  surgery  lumbar  1989 /1999    x 2  . Tonsillectomy    . Rotator cuff repair      right  . Colonoscopy    . Esophagogastroduodenoscopy    . Cholecystectomy    . Thyroidectomy  11/17/2011    Procedure: THYROIDECTOMY;  Surgeon: Darletta Moll, MD;  Location: [facility] OR;  Service: ENT;  Laterality: Right;  WITH FROZEN SECTION  . Cardiac catheterization  05/2010  . Abdominal hysterectomy    . Back surgery    . Nm myocar perf wall motion  11/12/2008    no ischemia   Family History  Problem Relation Age of Onset  . Anuerysm Mother   . Anesthesia problems Neg Hx   . Hypotension Neg Hx   . Malignant hyperthermia Neg Hx   . Pseudochol deficiency Neg Hx    History  Substance Use Topics  . Smoking status: Former  Smoker    Types: Cigarettes    Quit date: 09/06/1984  . Smokeless tobacco: Not on file  . Alcohol Use: No     Comment: Casual   OB History   Grav Para Term Preterm Abortions TAB SAB Ect Mult Living                 Review of Systems Negative except as stated in HPI  Allergies  Ace inhibitors; Bupropion hcl; Codeine; Penicillins; and Angiotensin receptor blockers  Home Medications   Current Outpatient Rx  Name  Route  Sig  Dispense  Refill  . amLODipine (NORVASC) 10 MG tablet      take 1 tablet by mouth once daily   30 tablet   11   . Ascorbic Acid (VITAMIN C) 500 MG tablet   Oral   Take 500 mg by mouth daily.          Marland Kitchen aspirin 81 MG tablet   Oral   Take 81 mg by mouth daily.         Marland Kitchen atorvastatin (LIPITOR) 20 MG tablet      take 1 tablet by mouth at bedtime   30 tablet   2   . calcium carbonate (OS-CAL - DOSED IN MG OF ELEMENTAL CALCIUM) 1250 MG tablet   Oral   Take 1 tablet by mouth daily.         . diclofenac sodium (VOLTAREN) 1 % GEL   Topical   Apply 1 application topically  4 (four) times daily.   100 g   4   . EPINEPHrine (EPIPEN 2-PAK) 0.3 mg/0.3 mL DEVI   Intramuscular   Inject 0.3 mLs (0.3 mg total) into the muscle once as needed. For severe allergic reactions   1 Device   1   . ferrous sulfate 325 (65 FE) MG tablet   Oral   Take 325 mg by mouth daily with breakfast.          . FLUoxetine (PROZAC) 20 MG capsule   Oral   Take 1 capsule (20 mg total) by mouth daily.   30 capsule   5     Dose increase effective  May 27,2014   . hydrochlorothiazide (MICROZIDE) 12.5 MG capsule      take 1 capsule by mouth once daily   30 capsule   5   . HYDROcodone-acetaminophen (NORCO) 7.5-325 MG per tablet   Oral   Take 1 tablet by mouth 4 (four) times daily.   120 tablet   2   . KLOR-CON 10 10 MEQ tablet      take 1 tablet by mouth twice a day   60 tablet   5   . levothyroxine (LEVOTHROID) 25 MCG tablet   Oral   Take 1 tablet (25 mcg total) by mouth daily.   90 tablet   1   . Multiple Vitamin (MULITIVITAMIN WITH MINERALS) TABS   Oral   Take 1 tablet by mouth daily.         . nitroGLYCERIN (NITROSTAT) 0.4 MG SL tablet   Sublingual   Place 0.4 mg under the tongue every 5 (five) minutes as needed. For chest pain         . omeprazole (PRILOSEC) 40 MG capsule      take 1 capsule by mouth once daily   30 capsule   5   . pyridoxine (B-6) 200 MG tablet   Oral   Take 200 mg by mouth 2 (two) times  daily.          Marland Kitchen ZOVIRAX 5 %      APPLY TOPICALLY EVERY 3 HOURS.   30 g   1    BP 136/89  Pulse 81  Temp(Src) 98.3 F (36.8 C) (Oral)  Ht 5\' 1"  (1.549 m)  Wt 168 lb (76.204 kg)  BMI 31.76 kg/m2  SpO2 94% Physical Exam  Nursing note and vitals reviewed. Constitutional: She is oriented to person, place, and time. She appears well-developed and well-nourished. No distress.  HENT:  Right Ear: Tympanic membrane normal.  Left Ear: Tympanic membrane normal.  Ears:  There are 2 superficial lacerations noted to the external ear.     Eyes: Conjunctivae and EOM are normal. Pupils are equal, round, and reactive to light.  Neck: Neck supple.  Pulmonary/Chest: Effort normal.  Abdominal: Soft. There is no tenderness.  Musculoskeletal: Normal range of motion.  Neurological: She is alert and oriented to person, place, and time. No cranial nerve deficit.  Skin: Skin is warm and dry.  Psychiatric: She has a normal mood and affect. Her behavior is normal.    ED Course  Procedures  Wounds cleaned, closed with Dermabond without difficulty. Patient tolerated well.   MDM   79 y.o. female with two tiny superficial lacerations to the right ear. Will up date tetanus and she is stable for discharge home without any immediate complications.  Discussed with the patient plan of care and all questioned fully answered. She will return if any problems arise.     Janne Napoleon, NP 07/14/13 2013

## 2013-07-14 NOTE — ED Notes (Signed)
Pt states picture frame fell of wall and cut her ear. Very small laceration to tragus of right ear. Unsure of last tetanus.

## 2013-07-15 NOTE — ED Provider Notes (Signed)
Medical screening examination/treatment/procedure(s) were performed by non-physician practitioner and as supervising physician I was immediately available for consultation/collaboration.  EKG Interpretation   None         Shelda Jakes, MD 07/15/13 0040

## 2013-07-17 ENCOUNTER — Other Ambulatory Visit: Payer: Self-pay

## 2013-07-17 ENCOUNTER — Telehealth: Payer: Self-pay

## 2013-07-17 MED ORDER — HYDROCODONE-ACETAMINOPHEN 7.5-325 MG PO TABS: 1.0000 | ORAL_TABLET | Freq: Four times a day (QID) | ORAL | Status: DC

## 2013-07-17 NOTE — Telephone Encounter (Signed)
Patients right arm has a knot (about the size of a nickel/quarter) and there is some redness. No warmth or drainage noted. Patient advised to use benadryl and tylenol and that benadryl may have sedative side effect. Will keep an eye on it and call back if it gets worse or no better in a few days unless you feel the need to prescribe some antibiotics.

## 2013-07-17 NOTE — Telephone Encounter (Signed)
Thanks , based, on description given UI agree with management

## 2013-07-30 ENCOUNTER — Other Ambulatory Visit: Payer: Self-pay | Admitting: Family Medicine

## 2013-08-08 ENCOUNTER — Other Ambulatory Visit: Payer: Self-pay

## 2013-08-08 MED ORDER — HYDROCODONE-ACETAMINOPHEN 7.5-325 MG PO TABS: 1.0000 | ORAL_TABLET | Freq: Four times a day (QID) | ORAL | Status: DC

## 2013-08-13 LAB — LIPID PANEL
Cholesterol: 134 mg/dL (ref 0–200)
HDL: 49 mg/dL (ref >39)
LDL Cholesterol: 65 mg/dL (ref 0–99)
Total CHOL/HDL Ratio: 2.7 Ratio
Triglycerides: 99 mg/dL (ref <150)
VLDL: 20 mg/dL (ref 0–40)

## 2013-08-13 LAB — COMPREHENSIVE METABOLIC PANEL
ALT: 21 U/L (ref 0–35)
AST: 27 U/L (ref 0–37)
Albumin: 4.2 g/dL (ref 3.5–5.2)
Alkaline Phosphatase: 105 U/L (ref 39–117)
BUN: 18 mg/dL (ref 6–23)
CO2: 32 mEq/L (ref 19–32)
Calcium: 9.7 mg/dL (ref 8.4–10.5)
Chloride: 101 mEq/L (ref 96–112)
Creat: 1.15 mg/dL — ABNORMAL HIGH (ref 0.50–1.10)
Glucose, Bld: 94 mg/dL (ref 70–99)
Potassium: 4.1 mEq/L (ref 3.5–5.3)
Sodium: 140 mEq/L (ref 135–145)
Total Bilirubin: 0.6 mg/dL (ref 0.3–1.2)
Total Protein: 6.7 g/dL (ref 6.0–8.3)

## 2013-08-13 LAB — HEMOGLOBIN A1C
Hgb A1c MFr Bld: 5.9 % — ABNORMAL HIGH (ref <5.7)
Mean Plasma Glucose: 123 mg/dL — ABNORMAL HIGH (ref <117)

## 2013-08-14 LAB — TSH: TSH: 2.871 u[IU]/mL (ref 0.350–4.500)

## 2013-08-20 ENCOUNTER — Encounter: Payer: Self-pay | Admitting: Family Medicine

## 2013-08-20 ENCOUNTER — Encounter (INDEPENDENT_AMBULATORY_CARE_PROVIDER_SITE_OTHER): Payer: Self-pay

## 2013-08-20 ENCOUNTER — Ambulatory Visit (INDEPENDENT_AMBULATORY_CARE_PROVIDER_SITE_OTHER): Payer: Medicare HMO | Admitting: Family Medicine

## 2013-08-20 VITALS — BP 133/66 | HR 82 | Resp 18 | Ht 61.0 in | Wt 170.1 lb

## 2013-08-20 DIAGNOSIS — F3289 Other specified depressive episodes: Secondary | ICD-10-CM

## 2013-08-20 DIAGNOSIS — F32A Depression, unspecified: Secondary | ICD-10-CM

## 2013-08-20 DIAGNOSIS — Z0000 Encounter for general adult medical examination without abnormal findings: Secondary | ICD-10-CM

## 2013-08-20 DIAGNOSIS — F329 Major depressive disorder, single episode, unspecified: Secondary | ICD-10-CM

## 2013-08-20 MED ORDER — FLUOXETINE HCL 20 MG PO TABS: ORAL_TABLET | ORAL | Status: DC

## 2013-08-20 NOTE — Progress Notes (Signed)
Subjective:    Patient ID: [name], female    DOB: 07/16/1934, 79 y.o.   MRN: [id number]  HPI  Preventive Screening-Counseling & Management   Patient present here today for a Medicare annual wellness visit.   Current Problems (verified)   Medications Prior to Visit Allergies (verified)   PAST HISTORY  Family History: mother died of a CVA,in her 67's , she had severe HTN, father died in his 84's and had severe dementia. No siblings  Social History Lives lone, mother of 3 adult children, no current nicotine, alcohol or illicit drug use   Risk Factors  Current exercise habits:  None. Pt strongly encouraged to commit to at least 3 days per week of exercise she is able to perform and gradually increase  Dietary issues discussed:low fat, controlled carb diet , highin fiber, fruit and vegetable, low intake of animal meat   Cardiac risk factors: Established CAD, hTN and hyperlipidemia, relative inactivity  Depression Screen  (Note: if answer to either of the following is "Yes", a more complete depression screening is indicated)   Over the past two weeks, have you felt down, depressed or hopeless? No  Over the past two weeks, have you felt little interest or pleasure in doing things? No  Have you lost interest or pleasure in daily life? No  Do you often feel hopeless? No  Do you cry easily over simple problems? No   Activities of Daily Living  In your present state of health, do you have any difficulty performing the following activities?  Driving?: No Managing money?: No Feeding yourself?:No Getting from bed to chair?:No Climbing a flight of stairs?:yes, holds on to rail Preparing food and eating?:No Bathing or showering?:No Getting dressed?:No Getting to the toilet?:No Using the toilet?:No Moving around from place to place?: Not generally, but if there is a flare of back pain ambulation may be limited, benefit from epidural injections  Fall Risk  Assessment In the past year have you fallen or had a near fall?:No Are you currently taking any medications that make you dizzy?:No   Hearing Difficulties: No Do you often ask people to speak up or repeat themselves?:No Do you experience ringing or noises in your ears?:No Do you have difficulty understanding soft or whispered voices?:No  Cognitive Testing  Alert? Yes Normal Appearance?Yes  Oriented to person? Yes Place? Yes  Time? Yes  Displays appropriate judgment?Yes  Can read the correct time from a watch face? yes Are you having problems remembering things? At times forget where I left things  Advanced Directives have been discussed with the patient?Yes , full code   List the Names of Other Physician/Practitioners you currently use: see list   Indicate any recent Medical Services you may have received from other than [facility] providers in the past year (date may be approximate).   Assessment:    Annual Wellness Exam   Plan:    During the course of the visit the patient was educated and counseled about appropriate screening and preventive services including:  A healthy diet is rich in fruit, vegetables and whole grains. Poultry fish, nuts and beans are a healthy choice for protein rather then red meat. A low sodium diet and drinking 64 ounces of water daily is generally recommended. Oils and sweet should be limited. Carbohydrates especially for those who are diabetic or overweight, should be limited to 30-45 gram per meal. It is important to eat on a regular schedule, at least 3 times daily. Snacks should  be primarily fruits, vegetables or nuts. It is important that you exercise regularly at least 30 minutes 5 times a week. If you develop chest pain, have severe difficulty breathing, or feel very tired, stop exercising immediately and seek medical attention  Immunization reviewed and updated. Cancer screening reviewed and updated    Patient Instructions (the written plan) was  given to the patient.  Medicare Attestation  I have personally reviewed:  The patient's medical and social history  Their use of alcohol, tobacco or illicit drugs  Their current medications and supplements  The patient's functional ability including ADLs,fall risks, home safety risks, cognitive, and hearing and visual impairment  Diet and physical activities  Evidence for depression or mood disorders  The patient's weight, height, BMI, and visual acuity have been recorded in the chart. I have made referrals, counseling, and provided education to the patient based on review of the above and I have provided the patient with a written personalized care plan for preventive services.     Review of Systems     Objective:   Physical Exam        Assessment & Plan:

## 2013-08-20 NOTE — Patient Instructions (Signed)
F/u in 3 months.Call if you need me before  Please reconsider seeing a therapist to help with your grief, KNOW you will benefit from this  Dose increase in fluoxetine to 30mg  gaily  Please look into joining the [facility] next year so you start regular exercise, this will strengthen muscles and help memory and symptoms of depression

## 2013-08-22 ENCOUNTER — Other Ambulatory Visit: Payer: Self-pay | Admitting: Family Medicine

## 2013-08-25 ENCOUNTER — Encounter: Payer: Self-pay | Admitting: Family Medicine

## 2013-08-25 DIAGNOSIS — Z0000 Encounter for general adult medical examination without abnormal findings: Secondary | ICD-10-CM | POA: Insufficient documentation

## 2013-08-25 NOTE — Assessment & Plan Note (Signed)
Reports increased withdrawal and lack of enjoyment in life. Not suicidal or homicidal. Continues to excessively dwell on the  loss of  Both parents both over the ages of 41. Despite having a supportive family (her adult children) and a long time female friend, none of this makes her happy.  Needs therapy, and still unwilling to commit Dose increase in the fluoxetine, f/u in 2 to 3 month

## 2013-08-25 NOTE — Assessment & Plan Note (Signed)
Annual wellnesss as documented. Pt needs to commit to regular exercise and would benefit frpm psychotherapy ford epression, still uncomited. Dose increase in fluoxetine

## 2013-09-03 ENCOUNTER — Other Ambulatory Visit: Payer: Self-pay | Admitting: *Deleted

## 2013-09-03 MED ORDER — NITROGLYCERIN 0.4 MG/SPRAY TL SOLN: 1.0000 | Status: DC | PRN

## 2013-09-03 NOTE — Telephone Encounter (Signed)
Rx was sent to pharmacy electronically. 

## 2013-09-05 ENCOUNTER — Encounter: Payer: Self-pay | Admitting: Cardiovascular Disease

## 2013-09-18 ENCOUNTER — Other Ambulatory Visit: Payer: Self-pay | Admitting: Family Medicine

## 2013-09-18 DIAGNOSIS — Z139 Encounter for screening, unspecified: Secondary | ICD-10-CM

## 2013-09-20 ENCOUNTER — Other Ambulatory Visit: Payer: Self-pay

## 2013-09-20 MED ORDER — HYDROCODONE-ACETAMINOPHEN 7.5-325 MG PO TABS: 1.0000 | ORAL_TABLET | Freq: Four times a day (QID) | ORAL | Status: DC

## 2013-10-11 ENCOUNTER — Ambulatory Visit (HOSPITAL_COMMUNITY)
Admission: RE | Admit: 2013-10-11 | Discharge: 2013-10-11 | Disposition: A | Discharge status: Home / Self Care | Payer: Medicare HMO | Source: Ambulatory Visit | Attending: Family Medicine | Admitting: Family Medicine

## 2013-10-11 DIAGNOSIS — Z1231 Encounter for screening mammogram for malignant neoplasm of breast: Secondary | ICD-10-CM | POA: Insufficient documentation

## 2013-10-11 DIAGNOSIS — Z139 Encounter for screening, unspecified: Secondary | ICD-10-CM

## 2013-10-19 ENCOUNTER — Other Ambulatory Visit: Payer: Self-pay

## 2013-10-19 MED ORDER — HYDROCODONE-ACETAMINOPHEN 7.5-325 MG PO TABS: 1.0000 | ORAL_TABLET | Freq: Four times a day (QID) | ORAL | Status: DC

## 2013-10-22 NOTE — Telephone Encounter (Signed)
Patient is aware 

## 2013-10-22 NOTE — Telephone Encounter (Signed)
noted 

## 2013-11-07 ENCOUNTER — Telehealth: Payer: Self-pay

## 2013-11-07 DIAGNOSIS — R7303 Prediabetes: Secondary | ICD-10-CM

## 2013-11-07 DIAGNOSIS — I10 Essential (primary) hypertension: Secondary | ICD-10-CM

## 2013-11-07 NOTE — Telephone Encounter (Signed)
Does she need any labs before she comes in?

## 2013-11-07 NOTE — Telephone Encounter (Signed)
Lab order mailed.

## 2013-11-07 NOTE — Addendum Note (Signed)
Addended by: Eual Fines on: 11/07/2013 03:41 PM   Modules accepted: Orders

## 2013-11-07 NOTE — Telephone Encounter (Signed)
HBA1C, chem 7 pls

## 2013-11-20 LAB — BASIC METABOLIC PANEL
BUN: 20 mg/dL (ref 6–23)
CO2: 33 mEq/L — ABNORMAL HIGH (ref 19–32)
Calcium: 9.6 mg/dL (ref 8.4–10.5)
Chloride: 100 mEq/L (ref 96–112)
Creat: 1.18 mg/dL — ABNORMAL HIGH (ref 0.50–1.10)
Glucose, Bld: 88 mg/dL (ref 70–99)
Potassium: 4.2 mEq/L (ref 3.5–5.3)
Sodium: 140 mEq/L (ref 135–145)

## 2013-11-20 LAB — HEMOGLOBIN A1C
Hgb A1c MFr Bld: 6 % — ABNORMAL HIGH (ref <5.7)
Mean Plasma Glucose: 126 mg/dL — ABNORMAL HIGH (ref <117)

## 2013-11-26 ENCOUNTER — Other Ambulatory Visit: Payer: Self-pay

## 2013-11-26 MED ORDER — HYDROCODONE-ACETAMINOPHEN 7.5-325 MG PO TABS: 1.0000 | ORAL_TABLET | Freq: Four times a day (QID) | ORAL | Status: DC

## 2013-11-28 ENCOUNTER — Ambulatory Visit (INDEPENDENT_AMBULATORY_CARE_PROVIDER_SITE_OTHER): Payer: Medicare HMO | Admitting: Family Medicine

## 2013-11-28 ENCOUNTER — Encounter: Payer: Self-pay | Admitting: Family Medicine

## 2013-11-28 VITALS — BP 124/84 | HR 97 | Resp 16 | Wt 176.0 lb

## 2013-11-28 DIAGNOSIS — IMO0002 Reserved for concepts with insufficient information to code with codable children: Secondary | ICD-10-CM

## 2013-11-28 DIAGNOSIS — F3289 Other specified depressive episodes: Secondary | ICD-10-CM

## 2013-11-28 DIAGNOSIS — Z7189 Other specified counseling: Secondary | ICD-10-CM

## 2013-11-28 DIAGNOSIS — E785 Hyperlipidemia, unspecified: Secondary | ICD-10-CM

## 2013-11-28 DIAGNOSIS — R7303 Prediabetes: Secondary | ICD-10-CM

## 2013-11-28 DIAGNOSIS — E039 Hypothyroidism, unspecified: Secondary | ICD-10-CM

## 2013-11-28 DIAGNOSIS — F32A Depression, unspecified: Secondary | ICD-10-CM

## 2013-11-28 DIAGNOSIS — F329 Major depressive disorder, single episode, unspecified: Secondary | ICD-10-CM

## 2013-11-28 DIAGNOSIS — R7309 Other abnormal glucose: Secondary | ICD-10-CM

## 2013-11-28 DIAGNOSIS — I10 Essential (primary) hypertension: Secondary | ICD-10-CM

## 2013-11-28 NOTE — Progress Notes (Signed)
   Subjective:    Patient ID: [name], female    DOB: August 21, 1934, 81 y.o.   MRN: [id number]  HPI The PT is here for follow up and re-evaluation of chronic medical conditions, medication management and review of any available recent lab and radiology data.  Preventive health is updated, specifically  Cancer screening and Immunization.   Questions or concerns regarding consultations or procedures which the PT has had in the interim are  addressed. The PT denies any adverse reactions to current medications since the last visit.  C/o increased and uncontrolled low back and right lower extremity pain with increased weakness in the right leg reducing ability to walk without a limp, as well as to get into a car/truck. Denies numbness or incontinence      Review of Systems See HPI Denies recent fever or chills. Denies sinus pressure, nasal congestion, ear pain or sore throat. Denies chest congestion, productive cough or wheezing. Denies chest pains, palpitations and leg swelling Denies abdominal pain, nausea, vomiting,diarrhea or constipation.   Denies dysuria, frequency, hesitancy or incontinence.  Denies headaches, seizures, numbness, or tingling. Denies uncontrolled  depression, anxiety or insomnia. Denies skin break down or rash.        Objective:   Physical Exam BP 124/84  Pulse 97  Resp 16  Wt 176 lb (79.833 kg)  SpO2 98% Patient alert and oriented and in no cardiopulmonary distress.  HEENT: No facial asymmetry, EOMI, no sinus tenderness,  oropharynx pink and moist.  Neck supple no adenopathy.  Chest: Clear to auscultation bilaterally.  CVS: S1, S2 no murmurs, no S3.  ABD: Soft non tender. Bowel sounds normal.  Ext: No edema  MS: decreased ROM lumbar  spine, adequate in shoulders, decreased in right hip and .  Skin: Intact, no ulcerations or rash noted.  Psych: Good eye contact, normal affect. Memory intact not anxious or depressed appearing.  CNS: CN  2-12 intact, power, reduced in right lower extremity , otherwise grade 5 throughout      Assessment & Plan:  BACK PAIN WITH RADICULOPATHY Increased and uncontrolled back pain with progressive right lower extremity weakness. Increased difficulty with ambulation and getting in and out of truck. Has had back surgery in the past , most recent epidural non beneficial, refer for MRI lumbar , with contrast  HYPERLIPIDEMIA Controlled, no change in medication Hyperlipidemia:Low fat diet discussed and encouraged. Updated lab needed at/ before next visit.     Depression Well treated on current medication, continue same Pt encouraged to continue to get out with family and friends more. Now mourning passing of her Dad, but that too is lessening  Prediabetes Unchanged, needs to reduce carbs and sweets to improve blood sugar  Hypothyroid Controlled, no change in medication Updated lab needed at/ before next visit.   Encounter for home safety review for injury prevention Reduction in fall risk and safety in the home discussed , and literature  provided on the discharge instruction sheet as well.   HYPERTENSION Controlled, no change in medication DASH diet and commitment to daily physical activity for a minimum of 30 minutes discussed and encouraged, as a part of hypertension management. The importance of attaining a healthy weight is also discussed.

## 2013-11-28 NOTE — Patient Instructions (Addendum)
F/U in 3 month, call if you need me before.  You are doing very well except for your back, continue all current medication  You have been referred for an MRI of your low back, and also to see a spine specialist due to uncontrolled back pain.  If you do need back surgery I will refer you for pre op cardiology evaluation    Fasting lipid, cmp , hBa1C, and TSH in 3 month, just before follow up  Fall Prevention and [place] cause injuries and can affect all age groups. It is possible to prevent falls.  HOW TO PREVENT FALLS  Wear shoes with rubber soles that do not have an opening for your toes.  Keep the inside and outside of your house well lit.  Use night lights throughout your home.  Remove clutter from floors.  Clean up floor spills.  Remove throw rugs or fasten them to the floor with carpet tape.  Do not place electrical cords across pathways.  Put grab bars by your tub, shower, and toilet. Do not use towel bars as grab bars.  Put handrails on both sides of the stairway. Fix loose handrails.  Do not climb on stools or stepladders, if possible.  Do not wax your floors.  Repair uneven or unsafe sidewalks, walkways, or stairs.  Keep items you use a lot within reach.  Be aware of pets.  Keep emergency numbers next to the telephone.  Put smoke detectors in your home and near bedrooms. Ask your doctor what other things you can do to prevent falls. Document Released: 06/19/2009 Document Revised: 02/22/2012 Document Reviewed: 11/23/2011 [facility] Patient Information 2014 [place], [place].

## 2013-11-29 ENCOUNTER — Other Ambulatory Visit: Payer: Self-pay

## 2013-11-29 MED ORDER — ATORVASTATIN CALCIUM 20 MG PO TABS: ORAL_TABLET | ORAL | Status: DC

## 2013-11-29 MED ORDER — LEVOTHYROXINE SODIUM 25 MCG PO TABS: ORAL_TABLET | ORAL | Status: DC

## 2013-11-29 MED ORDER — OMEPRAZOLE 40 MG PO CPDR: DELAYED_RELEASE_CAPSULE | ORAL | Status: DC

## 2013-11-29 MED ORDER — HYDROCHLOROTHIAZIDE 12.5 MG PO CAPS: ORAL_CAPSULE | ORAL | Status: DC

## 2013-11-30 DIAGNOSIS — Z7189 Other specified counseling: Secondary | ICD-10-CM | POA: Insufficient documentation

## 2013-11-30 NOTE — Assessment & Plan Note (Signed)
Reduction in fall risk and safety in the home discussed , and literature  provided on the discharge instruction sheet as well.  

## 2013-11-30 NOTE — Assessment & Plan Note (Signed)
Well treated on current medication, continue same Pt encouraged to continue to get out with family and friends more. Now mourning passing of her Dad, but that too is lessening

## 2013-11-30 NOTE — Assessment & Plan Note (Signed)
Increased and uncontrolled back pain with progressive right lower extremity weakness. Increased difficulty with ambulation and getting in and out of truck. Has had back surgery in the past , most recent epidural non beneficial, refer for MRI lumbar , with contrast

## 2013-11-30 NOTE — Assessment & Plan Note (Signed)
Controlled, no change in medication DASH diet and commitment to daily physical activity for a minimum of 30 minutes discussed and encouraged, as a part of hypertension management. The importance of attaining a healthy weight is also discussed.  

## 2013-11-30 NOTE — Assessment & Plan Note (Signed)
Controlled, no change in medication Updated lab needed at/ before next visit.  

## 2013-11-30 NOTE — Assessment & Plan Note (Signed)
Unchanged, needs to reduce carbs and sweets to improve blood sugar

## 2013-11-30 NOTE — Assessment & Plan Note (Addendum)
Controlled, no change in medication Hyperlipidemia:Low fat diet discussed and encouraged.  Updated lab needed at/ before next visit.  

## 2013-12-03 ENCOUNTER — Ambulatory Visit (HOSPITAL_COMMUNITY)
Admission: RE | Admit: 2013-12-03 | Discharge: 2013-12-03 | Disposition: A | Discharge status: Home / Self Care | Payer: Medicare HMO | Source: Ambulatory Visit | Attending: Family Medicine | Admitting: Family Medicine

## 2013-12-03 DIAGNOSIS — M47817 Spondylosis without myelopathy or radiculopathy, lumbosacral region: Secondary | ICD-10-CM | POA: Insufficient documentation

## 2013-12-03 DIAGNOSIS — M51379 Other intervertebral disc degeneration, lumbosacral region without mention of lumbar back pain or lower extremity pain: Secondary | ICD-10-CM | POA: Insufficient documentation

## 2013-12-03 DIAGNOSIS — Z981 Arthrodesis status: Secondary | ICD-10-CM | POA: Insufficient documentation

## 2013-12-03 DIAGNOSIS — M545 Low back pain, unspecified: Secondary | ICD-10-CM | POA: Insufficient documentation

## 2013-12-03 DIAGNOSIS — Q762 Congenital spondylolisthesis: Secondary | ICD-10-CM | POA: Insufficient documentation

## 2013-12-03 DIAGNOSIS — M48061 Spinal stenosis, lumbar region without neurogenic claudication: Secondary | ICD-10-CM | POA: Insufficient documentation

## 2013-12-03 DIAGNOSIS — M5126 Other intervertebral disc displacement, lumbar region: Secondary | ICD-10-CM | POA: Insufficient documentation

## 2013-12-03 DIAGNOSIS — IMO0002 Reserved for concepts with insufficient information to code with codable children: Secondary | ICD-10-CM

## 2013-12-03 DIAGNOSIS — M519 Unspecified thoracic, thoracolumbar and lumbosacral intervertebral disc disorder: Secondary | ICD-10-CM | POA: Insufficient documentation

## 2013-12-03 DIAGNOSIS — M5137 Other intervertebral disc degeneration, lumbosacral region: Secondary | ICD-10-CM | POA: Insufficient documentation

## 2013-12-03 MED ORDER — GADOBENATE DIMEGLUMINE 529 MG/ML IV SOLN
15.0000 mL | Freq: Once | INTRAVENOUS | Status: AC | PRN
  Administered 2013-12-03: 15 mL via INTRAVENOUS

## 2013-12-04 ENCOUNTER — Telehealth: Payer: Self-pay | Admitting: Family Medicine

## 2013-12-04 ENCOUNTER — Encounter: Payer: Self-pay | Admitting: Family Medicine

## 2013-12-04 NOTE — Telephone Encounter (Signed)
pls refer to neurosurgery spine center in [place], like Dr Vertell Limber, botero etc

## 2013-12-31 ENCOUNTER — Other Ambulatory Visit: Payer: Self-pay

## 2013-12-31 MED ORDER — HYDROCODONE-ACETAMINOPHEN 7.5-325 MG PO TABS: 1.0000 | ORAL_TABLET | Freq: Four times a day (QID) | ORAL | Status: DC

## 2014-01-22 ENCOUNTER — Other Ambulatory Visit: Payer: Self-pay

## 2014-01-22 ENCOUNTER — Encounter: Payer: Self-pay | Admitting: Family Medicine

## 2014-01-22 ENCOUNTER — Encounter (INDEPENDENT_AMBULATORY_CARE_PROVIDER_SITE_OTHER): Payer: Self-pay

## 2014-01-22 ENCOUNTER — Ambulatory Visit (INDEPENDENT_AMBULATORY_CARE_PROVIDER_SITE_OTHER): Payer: Medicare HMO | Admitting: Family Medicine

## 2014-01-22 VITALS — BP 138/84 | HR 87 | Resp 16 | Wt 176.8 lb

## 2014-01-22 DIAGNOSIS — I10 Essential (primary) hypertension: Secondary | ICD-10-CM

## 2014-01-22 DIAGNOSIS — F32A Depression, unspecified: Secondary | ICD-10-CM

## 2014-01-22 DIAGNOSIS — Z7189 Other specified counseling: Secondary | ICD-10-CM

## 2014-01-22 DIAGNOSIS — F3289 Other specified depressive episodes: Secondary | ICD-10-CM

## 2014-01-22 DIAGNOSIS — E039 Hypothyroidism, unspecified: Secondary | ICD-10-CM

## 2014-01-22 DIAGNOSIS — M549 Dorsalgia, unspecified: Secondary | ICD-10-CM

## 2014-01-22 DIAGNOSIS — F329 Major depressive disorder, single episode, unspecified: Secondary | ICD-10-CM

## 2014-01-22 DIAGNOSIS — E785 Hyperlipidemia, unspecified: Secondary | ICD-10-CM

## 2014-01-22 DIAGNOSIS — IMO0002 Reserved for concepts with insufficient information to code with codable children: Secondary | ICD-10-CM

## 2014-01-22 MED ORDER — METHYLPREDNISOLONE ACETATE 80 MG/ML IJ SUSP
80.0000 mg | Freq: Once | INTRAMUSCULAR | Status: AC
  Administered 2014-01-22: 80 mg via INTRAMUSCULAR

## 2014-01-22 MED ORDER — PREDNISONE 5 MG PO TABS: 5.0000 mg | ORAL_TABLET | Freq: Two times a day (BID) | ORAL | Status: AC

## 2014-01-22 MED ORDER — INDOMETHACIN 25 MG PO CAPS: 25.0000 mg | ORAL_CAPSULE | Freq: Two times a day (BID) | ORAL | Status: DC

## 2014-01-22 MED ORDER — HYDROCODONE-ACETAMINOPHEN 7.5-325 MG PO TABS: 1.0000 | ORAL_TABLET | Freq: Four times a day (QID) | ORAL | Status: DC

## 2014-01-22 MED ORDER — KETOROLAC TROMETHAMINE 60 MG/2ML IM SOLN
60.0000 mg | Freq: Once | INTRAMUSCULAR | Status: AC
  Administered 2014-01-22: 60 mg via INTRAMUSCULAR

## 2014-01-22 NOTE — Patient Instructions (Signed)
F/u as before   Toradol 60mg  IM and depo medrol 80mg  im in office today in the office Indomethacin one twice daily for 1 week, and prednisone 5mg  twice daily for 5 days are prescribed   We will get an appointment for you with Provider who will accept your insurance, pls let nurse know where you wish to go, you need a neurosurgical evaluation  Please use a cane  For safe ambulation,you will get a script today  Fall Prevention and Home Safety Falls cause injuries and can affect all age groups. It is possible to use preventive measures to significantly decrease the likelihood of falls. There are many simple measures which can make your home safer and prevent falls. OUTDOORS  Repair cracks and edges of walkways and driveways.  Remove high doorway thresholds.  Trim shrubbery on the main path into your home.  Have good outside lighting.  Clear walkways of tools, rocks, debris, and clutter.  Check that handrails are not broken and are securely fastened. Both sides of steps should have handrails.  Have leaves, snow, and ice cleared regularly.  Use sand or salt on walkways during winter months.  In the garage, clean up grease or oil spills. BATHROOM  Install night lights.  Install grab bars by the toilet and in the tub and shower.  Use non-skid mats or decals in the tub or shower.  Place a plastic non-slip stool in the shower to sit on, if needed.  Keep floors dry and clean up all water on the floor immediately.  Remove soap buildup in the tub or shower on a regular basis.  Secure bath mats with non-slip, double-sided rug tape.  Remove throw rugs and tripping hazards from the floors. BEDROOMS  Install night lights.  Make sure a bedside light is easy to reach.  Do not use oversized bedding.  Keep a telephone by your bedside.  Have a firm chair with side arms to use for getting dressed.  Remove throw rugs and tripping hazards from the floor. KITCHEN  Keep handles  on pots and pans turned toward the center of the stove. Use back burners when possible.  Clean up spills quickly and allow time for drying.  Avoid walking on wet floors.  Avoid hot utensils and knives.  Position shelves so they are not too high or low.  Place commonly used objects within easy reach.  If necessary, use a sturdy step stool with a grab bar when reaching.  Keep electrical cables out of the way.  Do not use floor polish or wax that makes floors slippery. If you must use wax, use non-skid floor wax.  Remove throw rugs and tripping hazards from the floor. STAIRWAYS  Never leave objects on stairs.  Place handrails on both sides of stairways and use them. Fix any loose handrails. Make sure handrails on both sides of the stairways are as long as the stairs.  Check carpeting to make sure it is firmly attached along stairs. Make repairs to worn or loose carpet promptly.  Avoid placing throw rugs at the top or bottom of stairways, or properly secure the rug with carpet tape to prevent slippage. Get rid of throw rugs, if possible.  Have an electrician put in a light switch at the top and bottom of the stairs. OTHER FALL PREVENTION TIPS  Wear low-heel or rubber-soled shoes that are supportive and fit well. Wear closed toe shoes.  When using a stepladder, make sure it is fully opened and both spreaders  are firmly locked. Do not climb a closed stepladder.  Add color or contrast paint or tape to grab bars and handrails in your home. Place contrasting color strips on first and last steps.  Learn and use mobility aids as needed. Install an electrical emergency response system.  Turn on lights to avoid dark areas. Replace light bulbs that burn out immediately. Get light switches that glow.  Arrange furniture to create clear pathways. Keep furniture in the same place.  Firmly attach carpet with non-skid or double-sided tape.  Eliminate uneven floor surfaces.  Select a  carpet pattern that does not visually hide the edge of steps.  Be aware of all pets. OTHER HOME SAFETY TIPS  Set the water temperature for 120 F (48.8 C).  Keep emergency numbers on or near the telephone.  Keep smoke detectors on every level of the home and near sleeping areas. Document Released: 08/13/2002 Document Revised: 02/22/2012 Document Reviewed: 11/12/2011 [facility] Patient Information 2014 [place].

## 2014-02-12 ENCOUNTER — Encounter: Payer: Self-pay | Admitting: Family Medicine

## 2014-02-12 ENCOUNTER — Telehealth: Payer: Self-pay | Admitting: Family Medicine

## 2014-02-14 ENCOUNTER — Other Ambulatory Visit: Payer: Self-pay

## 2014-02-14 MED ORDER — HYDROCODONE-ACETAMINOPHEN 7.5-325 MG PO TABS: 1.0000 | ORAL_TABLET | Freq: Four times a day (QID) | ORAL | Status: DC

## 2014-02-17 ENCOUNTER — Encounter: Payer: Self-pay | Admitting: Family Medicine

## 2014-02-17 NOTE — Assessment & Plan Note (Signed)
Controlled, no change in medication Updated lab needed at/ before next visit.  

## 2014-02-17 NOTE — Assessment & Plan Note (Addendum)
Controlled, no change in medication DASH diet and commitment to daily physical activity for a minimum of 30 minutes discussed and encouraged, as a part of hypertension management. The importance of attaining a healthy weight is also discussed.  

## 2014-02-17 NOTE — Assessment & Plan Note (Signed)
Improved and controlled on current med, no change 

## 2014-02-17 NOTE — Assessment & Plan Note (Signed)
Controlled, no change in medication Hyperlipidemia:Low fat diet discussed and encouraged.  Updated lab needed at/ before next visit.  

## 2014-02-17 NOTE — Assessment & Plan Note (Signed)
Increased and uncontrolled, unfortunately , still has not seen a spine specialist. 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

## 2014-02-17 NOTE — Progress Notes (Signed)
   Subjective:    Patient ID: [name], female    DOB: [birth year], 80 y.o.   MRN: [id number]  HPI The PT is here for follow up and re-evaluation of chronic medical conditions, medication management and review of any available recent lab and radiology data.  Preventive health is updated, specifically  Cancer screening and Immunization.   The PT denies any adverse reactions to current medications since the last visit.  Main complaint today is of ongoing severe low back and right lower extremity pain and weakness, increasing difficulty raising the leg to enter her truck, and increased risk of falls due to weakness. Still awaiting specialist eval,  We are challenged to find a Provider who uses he ins but will continue to look aggressively, she does need help C/o weakness and numbness of RLE , denies incontinence of stool or urine     Review of Systems See HPI Denies recent fever or chills. Denies sinus pressure, nasal congestion, ear pain or sore throat. Denies chest congestion, productive cough or wheezing. Denies chest pains, palpitations and leg swelling Denies abdominal pain, nausea, vomiting,diarrhea or constipation.   Denies dysuria, frequency, hesitancy or incontinence.  Denies headaches, seizures, numbness, or tingling. Denies uncontrolled  depression, anxiety or insomnia.Recentlky having increased difficult y when she renm]mebrs her parents some of which is related to time of year Denies skin break down or rash.        Objective:   Physical Exam  BP 138/84  Pulse 87  Resp 16  Wt 176 lb 12.8 oz (80.196 kg)  SpO2 99% Patient alert and oriented and in no cardiopulmonary distress.Pt in pain  HEENT: No facial asymmetry, EOMI,   oropharynx pink and moist.  Neck adequate ROM no JVD, no mass.  Chest: Clear to auscultation bilaterally.  CVS: S1, S2 no murmurs, no S3.  ABD: Soft non tender.   Ext: No edema  MS: marked decreased  ROM lumbar spine, hips and  knees.  Skin: Intact, no ulcerations or rash noted.  Psych: Good eye contact, normal affect. Memory intact not anxious or depressed appearing.  CNS: CN 2-12 intact, grade 4 power in RLE also reduced sensation in RLE, otherwise no other sensory defiicits noted in exam      Assessment & Plan:  HYPERTENSION Controlled, no change in medication DASH diet and commitment to daily physical activity for a minimum of 30 minutes discussed and encouraged, as a part of hypertension management. The importance of attaining a healthy weight is also discussed.   BACK PAIN WITH RADICULOPATHY Increased and uncontrolled, unfortunately , still has not seen a spine specialist. 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   HYPERLIPIDEMIA Controlled, no change in medication Hyperlipidemia:Low fat diet discussed and encouraged.  Updated lab needed at/ before next visit.   Encounter for home safety review for injury prevention Reduction in fall risk and safety in the home discussed , and literature  provided on the discharge instruction sheet as well.

## 2014-02-17 NOTE — Assessment & Plan Note (Signed)
Reduction in fall risk and safety in the home discussed , and literature  provided on the discharge instruction sheet as well.  

## 2014-02-19 ENCOUNTER — Telehealth: Payer: Self-pay | Admitting: Family Medicine

## 2014-02-19 NOTE — Telephone Encounter (Signed)
Noted will try to give her a direct call wishing her all the best

## 2014-02-19 NOTE — Telephone Encounter (Signed)
noted 

## 2014-02-19 NOTE — Telephone Encounter (Signed)
Please advise 

## 2014-02-27 ENCOUNTER — Encounter: Payer: Self-pay | Admitting: Cardiology

## 2014-02-27 ENCOUNTER — Ambulatory Visit (INDEPENDENT_AMBULATORY_CARE_PROVIDER_SITE_OTHER): Payer: Medicare HMO | Admitting: Cardiology

## 2014-02-27 VITALS — BP 140/82 | HR 66 | Ht 62.0 in | Wt 171.0 lb

## 2014-02-27 DIAGNOSIS — I10 Essential (primary) hypertension: Secondary | ICD-10-CM

## 2014-02-27 DIAGNOSIS — IMO0002 Reserved for concepts with insufficient information to code with codable children: Secondary | ICD-10-CM

## 2014-02-27 DIAGNOSIS — I2510 Atherosclerotic heart disease of native coronary artery without angina pectoris: Secondary | ICD-10-CM

## 2014-02-27 NOTE — Patient Instructions (Signed)
You are cleared for surgery.  Call if any problems prior to that time.  Follow up with Dr. Sallyanne Kuster in 3 months.

## 2014-02-27 NOTE — Progress Notes (Signed)
03/03/2014   PCP: Tula Nakayama, MD   Chief Complaint  Patient presents with  . Follow-up    pt. needs clearence for back surgery   having back surgery in [place]    Primary Cardiologist:Dr. Bertrum Sol   HPI:  80 year-old female is here today for surgical clearance for back surgery. Says complaints of lumbar spine disease. Plan is for surgery in [place] with neurosurgeon Dr. Colleen Can, he has done surgery on her previously.  She has a history of coronary artery disease and vasospastic angina.  2011 she had a non-ST elevation MI type 2 due to demand ischemia.  Cardiac cath was done at that time with minimal coronary atherosclerosis resulting in presumtive diagnosis of vasospastic angina/infarction was made. Has been treated for coronary disease with beta blocker and statin since that time.  She has no chest pain and no SOB.  She feels well without palpitations, lightheadedness or dizziness.  Her back pain is only complaint.     Allergies  Allergen Reactions  . Ace Inhibitors Anaphylaxis    Was on ventilator for six days due to airway swelling shut while on ACE inhibitors.  . Bupropion Hcl Anaphylaxis  . Codeine Anaphylaxis  . Penicillins Anaphylaxis  . Angiotensin Receptor Blockers     Patient unable to recall what medication this might have been and/or her reaction to it.    Current Outpatient Prescriptions  Medication Sig Dispense Refill  . amLODipine (NORVASC) 10 MG tablet take 1 tablet by mouth once daily  30 tablet  11  . Ascorbic Acid (VITAMIN C) 500 MG tablet Take 500 mg by mouth daily.       Marland Kitchen aspirin 81 MG tablet Take 81 mg by mouth daily.      Marland Kitchen atorvastatin (LIPITOR) 20 MG tablet take 1 tablet by mouth at bedtime  30 tablet  5  . calcium carbonate (OS-CAL - DOSED IN MG OF ELEMENTAL CALCIUM) 1250 MG tablet Take 1 tablet by mouth daily.      . Cyanocobalamin (B-12) 1000 MCG CAPS Take 1 capsule by mouth daily.      . diclofenac sodium  (VOLTAREN) 1 % GEL Apply 1 application topically 4 (four) times daily.  100 g  4  . EPINEPHrine (EPIPEN 2-PAK) 0.3 mg/0.3 mL DEVI Inject 0.3 mLs (0.3 mg total) into the muscle once as needed. For severe allergic reactions  1 Device  1  . ferrous sulfate 325 (65 FE) MG tablet Take 325 mg by mouth daily with breakfast.       . FLUoxetine (PROZAC) 20 MG tablet One and a half tablets once daily  45 tablet  4  . hydrochlorothiazide (MICROZIDE) 12.5 MG capsule take 1 capsule by mouth once daily  30 capsule  5  . HYDROcodone-acetaminophen (NORCO) 7.5-325 MG per tablet Take 1 tablet by mouth 4 (four) times daily.  120 tablet  0  . indomethacin (INDOCIN) 25 MG capsule Take 1 capsule (25 mg total) by mouth 2 (two) times daily with a meal.  14 capsule  0  . KLOR-CON 10 10 MEQ tablet take 1 tablet by mouth twice a day  60 tablet  5  . levothyroxine (SYNTHROID, LEVOTHROID) 25 MCG tablet take 1 tablet by mouth once daily  90 tablet  1  . Multiple Vitamin (MULITIVITAMIN WITH MINERALS) TABS Take 1 tablet by mouth daily.      . nitroGLYCERIN (NITROLINGUAL) 0.4 MG/SPRAY spray Place 1 spray under  the tongue every 5 (five) minutes x 3 doses as needed for chest pain.  12 g  1  . nitroGLYCERIN (NITROSTAT) 0.4 MG SL tablet Place 0.4 mg under the tongue every 5 (five) minutes as needed. For chest pain      . omeprazole (PRILOSEC) 40 MG capsule take 1 capsule by mouth once daily  30 capsule  5  . pyridoxine (B-6) 200 MG tablet Take 200 mg by mouth 2 (two) times daily.       Marland Kitchen ZOVIRAX 5 % APPLY TOPICALLY EVERY 3 HOURS.  30 g  1   Current Facility-Administered Medications  Medication Dose Route Frequency Provider Last Rate Last Dose  . Influenza (>/= 3 years) inactive virus vaccine (FLVIRIN/FLUZONE) injection SUSP 0.5 mL  0.5 mL Intramuscular Once Fayrene Helper, MD        Past Medical History  Diagnosis Date  . Hyperlipidemia     takes Lipitor nightly  . Obesity   . DJD (degenerative joint disease)     Severe    . CAD (coronary artery disease)   . Chronic back pain   . Carpal tunnel syndrome on left     Left Hand pain with numbness, new onset   . Difficult intubation     small airway  . Hypertension     takes Amlodipine and Metoprolol daily  . Blood clot associated with vein wall inflammation     hx of in right leg  . Sleep apnea     sleep study done 74yrs ago  . Shortness of breath     with exertion  . Pneumonia     hx of-in 2011  . Headache(784.0)     occasionally  . Chronic back pain     buldging disc  . GERD (gastroesophageal reflux disease)     takes Omeprazole daily  . Gastric ulcer     hx of   . Constipation   . Diarrhea   . Nocturia   . Anemia   . Thyroid mass     right    Past Surgical History  Procedure Laterality Date  . Appendectomy  1987  . Carpal tunnel release right      x 2  . Partial hysterectomy  1972  . Arthroscopy left shoulder  1999  . Arthoscopy left knee  1992  . Back  surgery  lumbar  1989 /1999    x 2  . Tonsillectomy    . Rotator cuff repair      right  . Colonoscopy    . Esophagogastroduodenoscopy    . Cholecystectomy    . Thyroidectomy  11/17/2011    Procedure: THYROIDECTOMY;  Surgeon: Ascencion Dike, MD;  Location: [facility];  Service: ENT;  Laterality: Right;  WITH FROZEN SECTION  . Cardiac catheterization  05/2010  . Abdominal hysterectomy    . Back surgery    . Nm myocar perf wall motion  11/12/2008    no ischemia  . Spine surgery      MGQ:QPYPPJK:DT colds or fevers, no weight changes Skin:no rashes or ulcers HEENT:no blurred vision, no congestion CV:see HPI PUL:see HPI GI:no diarrhea constipation or melena, no indigestion GU:no hematuria, no dysuria MS:no joint pain, no claudication Neuro:no syncope, no lightheadedness Endo:no diabetes, no thyroid disease  Wt Readings from Last 3 Encounters:  02/27/14 171 lb (77.565 kg)  01/22/14 176 lb 12.8 oz (80.196 kg)  11/28/13 176 lb (79.833 kg)    PHYSICAL EXAM BP 140/82  Pulse 66  Ht  5\' 2"  (1.575 m)  Wt 171 lb (77.565 kg)  BMI 31.27 kg/m2 General:Pleasant affect, NAD Skin:Warm and dry, brisk capillary refill HEENT:normocephalic, sclera clear, mucus membranes moist Neck:supple, no JVD, no bruits  Heart:S1S2 RRR without murmur, gallup, rub or click Lungs:clear without rales, rhonchi, or wheezes IAX:KPVV, non tender, + BS, do not palpate liver spleen or masses Ext:no lower ext edema, 2+ pedal pulses, 2+ radial pulses Neuro:alert and oriented, MAE, follows commands, + facial symmetry EKG:SR no changes from previous EKG, normal EKG  ASSESSMENT AND PLAN CAD Nonobstructive coronary disease diagnosed 2011 with cardiac catheterization. 40% LAD stenosis was the greatest amount of disease she had. Cardiac cath was done for non-ST elevation MI type II secondary to demand ischemia with pulmonary issues. She has not had chest pain or shortness of breath.  HYPERTENSION Controlled  BACK PAIN WITH RADICULOPATHY Plan first surgery in [place] with Dr. Colleen Can.  Reviewed with Dr. Ellyn Hack and using Truman Hayward Criteria pt is low risk for back surgery.

## 2014-03-03 NOTE — Assessment & Plan Note (Signed)
Controlled.  

## 2014-03-03 NOTE — Assessment & Plan Note (Addendum)
Plan first surgery in [place] with Dr. Colleen Can.  Reviewed with Dr. Ellyn Hack and using Truman Hayward Criteria pt is low risk for back surgery.

## 2014-03-03 NOTE — Assessment & Plan Note (Signed)
Nonobstructive coronary disease diagnosed 2011 with cardiac catheterization. 40% LAD stenosis was the greatest amount of disease she had. Cardiac cath was done for non-ST elevation MI type II secondary to demand ischemia with pulmonary issues. She has not had chest pain or shortness of breath.

## 2014-03-06 ENCOUNTER — Ambulatory Visit: Payer: Medicare HMO | Admitting: Family Medicine

## 2014-03-17 ENCOUNTER — Other Ambulatory Visit: Payer: Self-pay | Admitting: Family Medicine

## 2014-03-19 HISTORY — PX: LUMBAR FUSION: SHX111

## 2014-03-28 ENCOUNTER — Other Ambulatory Visit: Payer: Self-pay

## 2014-03-28 MED ORDER — HYDROCODONE-ACETAMINOPHEN 7.5-325 MG PO TABS: 1.0000 | ORAL_TABLET | Freq: Four times a day (QID) | ORAL | Status: DC

## 2014-05-31 ENCOUNTER — Ambulatory Visit: Payer: Medicare HMO | Admitting: Cardiovascular Disease

## 2014-06-06 ENCOUNTER — Other Ambulatory Visit: Payer: Self-pay | Admitting: Family Medicine

## 2014-06-18 ENCOUNTER — Other Ambulatory Visit: Payer: Self-pay | Admitting: Family Medicine

## 2014-06-18 LAB — COMPREHENSIVE METABOLIC PANEL
ALT: 19 U/L (ref 0–35)
AST: 26 U/L (ref 0–37)
Albumin: 3.8 g/dL (ref 3.5–5.2)
Alkaline Phosphatase: 92 U/L (ref 39–117)
BUN: 14 mg/dL (ref 6–23)
CO2: 32 mEq/L (ref 19–32)
Calcium: 9.4 mg/dL (ref 8.4–10.5)
Chloride: 101 mEq/L (ref 96–112)
Creat: 0.93 mg/dL (ref 0.50–1.10)
Glucose, Bld: 92 mg/dL (ref 70–99)
Potassium: 3.8 mEq/L (ref 3.5–5.3)
Sodium: 142 mEq/L (ref 135–145)
Total Bilirubin: 0.4 mg/dL (ref 0.2–1.2)
Total Protein: 6.5 g/dL (ref 6.0–8.3)

## 2014-06-18 LAB — TSH: TSH: 3.442 u[IU]/mL (ref 0.350–4.500)

## 2014-06-18 LAB — CBC
HCT: 38 % (ref 36.0–46.0)
Hemoglobin: 12.2 g/dL (ref 12.0–15.0)
MCH: 28.2 pg (ref 26.0–34.0)
MCHC: 32.1 g/dL (ref 30.0–36.0)
MCV: 87.8 fL (ref 78.0–100.0)
Platelets: 295 10*3/uL (ref 150–400)
RBC: 4.33 MIL/uL (ref 3.87–5.11)
RDW: 14.3 % (ref 11.5–15.5)
WBC: 4.7 10*3/uL (ref 4.0–10.5)

## 2014-06-18 LAB — LIPID PANEL
Cholesterol: 131 mg/dL (ref 0–200)
HDL: 46 mg/dL (ref >39)
LDL Cholesterol: 60 mg/dL (ref 0–99)
Total CHOL/HDL Ratio: 2.8 Ratio
Triglycerides: 125 mg/dL (ref <150)
VLDL: 25 mg/dL (ref 0–40)

## 2014-06-19 LAB — HEMOGLOBIN A1C
Hgb A1c MFr Bld: 5.5 % (ref <5.7)
Mean Plasma Glucose: 111 mg/dL (ref <117)

## 2014-06-25 ENCOUNTER — Other Ambulatory Visit: Payer: Self-pay

## 2014-06-25 ENCOUNTER — Encounter: Payer: Self-pay | Admitting: Family Medicine

## 2014-06-25 ENCOUNTER — Ambulatory Visit (HOSPITAL_COMMUNITY)
Admission: RE | Admit: 2014-06-25 | Discharge: 2014-06-25 | Disposition: A | Discharge status: Home / Self Care | Payer: Medicare HMO | Source: Ambulatory Visit | Attending: Family Medicine | Admitting: Family Medicine

## 2014-06-25 ENCOUNTER — Encounter (INDEPENDENT_AMBULATORY_CARE_PROVIDER_SITE_OTHER): Payer: Self-pay

## 2014-06-25 ENCOUNTER — Ambulatory Visit (INDEPENDENT_AMBULATORY_CARE_PROVIDER_SITE_OTHER): Payer: Medicare HMO | Admitting: Family Medicine

## 2014-06-25 VITALS — BP 130/80 | HR 85 | Resp 16 | Ht 62.0 in | Wt 174.4 lb

## 2014-06-25 DIAGNOSIS — Z23 Encounter for immunization: Secondary | ICD-10-CM | POA: Insufficient documentation

## 2014-06-25 DIAGNOSIS — M79604 Pain in right leg: Secondary | ICD-10-CM

## 2014-06-25 DIAGNOSIS — F329 Major depressive disorder, single episode, unspecified: Secondary | ICD-10-CM

## 2014-06-25 DIAGNOSIS — I10 Essential (primary) hypertension: Secondary | ICD-10-CM

## 2014-06-25 DIAGNOSIS — K2100 Gastro-esophageal reflux disease with esophagitis, without bleeding: Secondary | ICD-10-CM

## 2014-06-25 DIAGNOSIS — Z9889 Other specified postprocedural states: Secondary | ICD-10-CM | POA: Diagnosis not present

## 2014-06-25 DIAGNOSIS — E038 Other specified hypothyroidism: Secondary | ICD-10-CM

## 2014-06-25 DIAGNOSIS — R7309 Other abnormal glucose: Secondary | ICD-10-CM

## 2014-06-25 DIAGNOSIS — E785 Hyperlipidemia, unspecified: Secondary | ICD-10-CM

## 2014-06-25 DIAGNOSIS — R7303 Prediabetes: Secondary | ICD-10-CM

## 2014-06-25 DIAGNOSIS — F32A Depression, unspecified: Secondary | ICD-10-CM

## 2014-06-25 MED ORDER — ATORVASTATIN CALCIUM 20 MG PO TABS: ORAL_TABLET | ORAL | Status: DC

## 2014-06-25 NOTE — Assessment & Plan Note (Signed)
Marked improvement post operatively, pt to resume regular exercise per recommendations of back surgeon

## 2014-06-25 NOTE — Progress Notes (Signed)
Subjective:    Patient ID: [name], female    DOB: 23-Aug-1934, 80 y.o.   MRN: [id number]  HPI The PT is here for follow up and re-evaluation of chronic medical conditions, medication management and review of any available recent lab and radiology data.  Preventive health is updated, specifically  Cancer screening and Immunization.   She had successful lumbar fusion (3rd time) in 03/2014 in [place], the surgeon who operated had operated on her on 2 previous occasions.Post op she did have intestinal obstruction, but was transferred for 3 weeks to a SNF 4 days after surgery. No bowel complaints at this time. Feels well Has been back in [place] for approx 3 weeks , notes intermittent spasms, cramping and pain in right thigh since her return which concerns her. No abnormality in skin. She is not as commited to regular exercise as when she was in therapy and she is having what sounds like spasms Bike riding is a recommended exercise regime and she has a stationary bike at home which she will start to use  Needs flu vaccine today The PT denies any adverse reactions to current medications since the last visit.         Review of Systems See HPI Denies recent fever or chills. Denies sinus pressure, nasal congestion, ear pain or sore throat. Denies chest congestion, productive cough or wheezing. Denies chest pains, palpitations and leg swelling Denies abdominal pain, nausea, vomiting,diarrhea or constipation.   Denies dysuria, frequency, hesitancy or incontinence. Marked improvement in walking , less pain Denies headaches, seizures, numbness, or tingling. Denies depression, anxiety or insomnia. Denies skin break down or rash.        Objective:   Physical Exam BP 130/80  Pulse 85  Resp 16  Ht 5\' 2"  (1.575 m)  Wt 174 lb 6.4 oz (79.107 kg)  BMI 31.89 kg/m2  SpO2 99% Patient alert and oriented and in no cardiopulmonary distress.  HEENT: No facial asymmetry, EOMI,   oropharynx  pink and moist.  Neck supple no JVD, no mass.  Chest: Clear to auscultation bilaterally.  CVS: S1, S2 no murmurs, no S3.Regular rate.  ABD: Soft non tender.   Ext: No edema  MS: Adequate though reduced ROM spine, marked improvement however, normal in  shoulders, hips and knees.  Skin: Intact, no ulcerations or rash noted.  Psych: Good eye contact, normal affect. Memory intact not anxious or depressed appearing.  CNS: CN 2-12 intact, power,  normal throughout.no focal deficits noted.        Assessment & Plan:  Essential hypertension Controlled, no change in medication   Hypothyroid Controlled, no change in medication   BACK PAIN WITH RADICULOPATHY Marked improvement post operatively, pt to resume regular exercise per recommendations of back surgeon  Leg pain, right Thigh pain intermittently x 3 weeks . S/p surgery, likely spasm , will r/o DVt, pt to resume regular exercise  GERD Controlled, no change in medication   Prediabetes Improved with normal blood sugar in 06/2014 Patient educated about the importance of limiting  Carbohydrate intake , the need to commit to daily physical activity for a minimum of 30 minutes , and to commit weight loss. The fact that changes in all these areas will reduce or eliminate all together the development of diabetes is stressed.   She is congratulated on current success  Depression Doing very well on current medication, continue same  Need for prophylactic vaccination and inoculation against influenza Vaccine administered at visit.

## 2014-06-25 NOTE — Assessment & Plan Note (Signed)
Controlled, no change in medication  

## 2014-06-25 NOTE — Assessment & Plan Note (Signed)
Thigh pain intermittently x 3 weeks . S/p surgery, likely spasm , will r/o DVt, pt to resume regular exercise

## 2014-06-25 NOTE — Patient Instructions (Signed)
Annual wellness in 4 month, call if you need me before  Excelent labs and blood pressure  Thankful surgery was very successful  I believe that pain in right thigh is from muscle spasm and that you need to commit to daily exercise like you were doing while in therapy  You are referred for a test to ensure no clot is in the rigth leg  Flu vaccine today  Fasting lipid, cmp and EGFR and tSH in 4.5 month  Merry Christmas, Happy thanksgiving, and belated Happy Rudene Anda!!!

## 2014-06-25 NOTE — Assessment & Plan Note (Signed)
Improved with normal blood sugar in 06/2014 Patient educated about the importance of limiting  Carbohydrate intake , the need to commit to daily physical activity for a minimum of 30 minutes , and to commit weight loss. The fact that changes in all these areas will reduce or eliminate all together the development of diabetes is stressed.   She is congratulated on current success

## 2014-06-25 NOTE — Assessment & Plan Note (Signed)
Vaccine administered at visit.  

## 2014-06-25 NOTE — Assessment & Plan Note (Signed)
Doing very well on current medication, continue same

## 2014-07-23 ENCOUNTER — Other Ambulatory Visit: Payer: Self-pay | Admitting: Family Medicine

## 2014-08-14 ENCOUNTER — Encounter: Payer: Self-pay | Admitting: Cardiovascular Disease

## 2014-08-14 ENCOUNTER — Ambulatory Visit (INDEPENDENT_AMBULATORY_CARE_PROVIDER_SITE_OTHER): Payer: Medicare HMO | Admitting: Cardiovascular Disease

## 2014-08-14 VITALS — BP 128/84 | HR 71 | Resp 20 | Ht 61.0 in | Wt 168.1 lb

## 2014-08-14 DIAGNOSIS — I10 Essential (primary) hypertension: Secondary | ICD-10-CM

## 2014-08-14 DIAGNOSIS — I201 Angina pectoris with documented spasm: Secondary | ICD-10-CM

## 2014-08-14 DIAGNOSIS — R0602 Shortness of breath: Secondary | ICD-10-CM

## 2014-08-14 DIAGNOSIS — I25111 Atherosclerotic heart disease of native coronary artery with angina pectoris with documented spasm: Secondary | ICD-10-CM

## 2014-08-14 DIAGNOSIS — E785 Hyperlipidemia, unspecified: Secondary | ICD-10-CM

## 2014-08-14 DIAGNOSIS — R079 Chest pain, unspecified: Secondary | ICD-10-CM

## 2014-08-14 MED ORDER — ISOSORBIDE MONONITRATE ER 30 MG PO TB24: 30.0000 mg | ORAL_TABLET | Freq: Every day | ORAL | Status: DC

## 2014-08-14 MED ORDER — NITROGLYCERIN 0.4 MG/SPRAY TL SOLN: 1.0000 | Status: DC | PRN

## 2014-08-14 NOTE — Patient Instructions (Signed)
START Isosorbide 30mg  daily.  Your physician has requested that you have a lexiscan myoview. For further information please visit [email or web address]. Please follow instruction sheet, as given.  Dr. Sallyanne Kuster recommends that you schedule a follow-up appointment in: 6 weeks.

## 2014-08-16 ENCOUNTER — Encounter: Payer: Self-pay | Admitting: Cardiovascular Disease

## 2014-08-16 NOTE — Progress Notes (Signed)
Patient ID: [name], female   DOB: 1933-11-30, 80 y.o.   MRN: [id number]     Reason for office visit CAD, exertional chest pain.  [name] has less back pain and is more active following repeat lumbar spine surgery in July. She tries to exercise regularly and has noticed exercise produces a fairly predictable and mild chest tightness and mild dyspnea, that resolves with rest. She is unable to walk on a treadmill due to her back problems.  She has a history of coronary artery disease and vasospastic angina.In 2011 she had a non-ST elevation MI type 2 due to demand ischemia. Cardiac cath was done at that time with minimal coronary atherosclerosis resulting in presumtive diagnosis of vasospastic angina/infarction was made. Has been treated for coronary disease with beta blocker and statin since that time.   Allergies  Allergen Reactions  . Ace Inhibitors Anaphylaxis    Was on ventilator for six days due to airway swelling shut while on ACE inhibitors.  . Bupropion Hcl Anaphylaxis  . Codeine Anaphylaxis  . Penicillins Anaphylaxis  . Angiotensin Receptor Blockers     Patient unable to recall what medication this might have been and/or her reaction to it.    Current Outpatient Prescriptions  Medication Sig Dispense Refill  . amLODipine (NORVASC) 10 MG tablet take 1 tablet by mouth once daily 30 tablet 5  . Ascorbic Acid (VITAMIN C) 500 MG tablet Take 500 mg by mouth daily.     Marland Kitchen aspirin 81 MG tablet Take 81 mg by mouth daily.    Marland Kitchen atorvastatin (LIPITOR) 20 MG tablet take 1 tablet by mouth at bedtime 90 tablet 1  . calcium carbonate (OS-CAL - DOSED IN MG OF ELEMENTAL CALCIUM) 1250 MG tablet Take 1 tablet by mouth daily.    . Cyanocobalamin (B-12) 1000 MCG CAPS Take 1 capsule by mouth daily.    . diclofenac sodium (VOLTAREN) 1 % GEL Apply 1 application topically 4 (four) times daily. 100 g 4  . EPINEPHrine (EPIPEN 2-PAK) 0.3 mg/0.3 mL DEVI Inject 0.3 mLs (0.3 mg total) into  the muscle once as needed. For severe allergic reactions 1 Device 1  . ferrous sulfate 325 (65 FE) MG tablet Take 325 mg by mouth daily with breakfast.     . FLUoxetine (PROZAC) 20 MG tablet take 1 and 1/2 tablet by mouth once daily 45 tablet 3  . hydrochlorothiazide (MICROZIDE) 12.5 MG capsule take 1 capsule by mouth once daily 30 capsule 5  . HYDROcodone-acetaminophen (NORCO) 7.5-325 MG per tablet Take 1 tablet by mouth 4 (four) times daily. 120 tablet 0  . KLOR-CON 10 10 MEQ tablet take 1 tablet twice a day 60 tablet 5  . levothyroxine (SYNTHROID, LEVOTHROID) 25 MCG tablet take 1 tablet by mouth once daily 90 tablet 1  . Multiple Vitamin (MULITIVITAMIN WITH MINERALS) TABS Take 1 tablet by mouth daily.    . nitroGLYCERIN (NITROLINGUAL) 0.4 MG/SPRAY spray Place 1 spray under the tongue every 5 (five) minutes x 3 doses as needed for chest pain. 12 g 1  . omeprazole (PRILOSEC) 40 MG capsule take 1 capsule by mouth once daily 30 capsule 5  . pyridoxine (B-6) 200 MG tablet Take 200 mg by mouth 2 (two) times daily.     Marland Kitchen ZOVIRAX 5 % APPLY TOPICALLY EVERY 3 HOURS. 30 g 1  . isosorbide mononitrate (IMDUR) 30 MG 24 hr tablet Take 1 tablet (30 mg total) by mouth daily. 30 tablet 6   Current Facility-Administered Medications  Medication Dose Route Frequency Provider Last Rate Last Dose  . Influenza (>/= 3 years) inactive virus vaccine (FLVIRIN/FLUZONE) injection SUSP 0.5 mL  0.5 mL Intramuscular Once Fayrene Helper, MD        Past Medical History  Diagnosis Date  . Hyperlipidemia     takes Lipitor nightly  . Obesity   . DJD (degenerative joint disease)     Severe  . CAD (coronary artery disease)   . Chronic back pain   . Carpal tunnel syndrome on left     Left Hand pain with numbness, new onset   . Difficult intubation     small airway  . Hypertension     takes Amlodipine and Metoprolol daily  . Blood clot associated with vein wall inflammation     hx of in right leg  . Sleep apnea      sleep study done 48yrs ago  . Shortness of breath     with exertion  . Pneumonia     hx of-in 2011  . Headache(784.0)     occasionally  . Chronic back pain     buldging disc  . GERD (gastroesophageal reflux disease)     takes Omeprazole daily  . Gastric ulcer     hx of   . Constipation   . Diarrhea   . Nocturia   . Anemia   . Thyroid mass     right    Past Surgical History  Procedure Laterality Date  . Appendectomy  1987  . Carpal tunnel release right      x 2  . Partial hysterectomy  1972  . Arthroscopy left shoulder  1999  . Arthoscopy left knee  1992  . Back  surgery  lumbar  1989 /1999    x 2  . Tonsillectomy    . Rotator cuff repair      right  . Colonoscopy    . Esophagogastroduodenoscopy    . Cholecystectomy    . Thyroidectomy  11/17/2011    Procedure: THYROIDECTOMY;  Surgeon: Ascencion Dike, MD;  Location: [facility];  Service: ENT;  Laterality: Right;  WITH FROZEN SECTION  . Cardiac catheterization  05/2010  . Abdominal hysterectomy    . Back surgery    . Nm myocar perf wall motion  11/12/2008    no ischemia  . Spine surgery    . Lumbar fusion  March 19, 2014    Family History  Problem Relation Age of Onset  . Anuerysm Mother   . Anesthesia problems Neg Hx   . Hypotension Neg Hx   . Malignant hyperthermia Neg Hx   . Pseudochol deficiency Neg Hx     History   Social History  . Marital Status: Widowed    Spouse Name: N/A    Number of Children: 4  . Years of Education: N/A   Occupational History  .     Social History Main Topics  . Smoking status: Former Smoker    Types: Cigarettes    Quit date: 09/06/1984  . Smokeless tobacco: Not on file  . Alcohol Use: No     Comment: Casual  . Drug Use: No  . Sexual Activity: No   Other Topics Concern  . Not on file   Social History Narrative    Review of systems: The patient specifically denies any chest pain at rest , dyspnea at rest or with exertion, orthopnea, paroxysmal nocturnal dyspnea, syncope,  palpitations, focal neurological deficits, intermittent claudication, lower extremity  edema, unexplained weight gain, cough, hemoptysis or wheezing.  The patient also denies abdominal pain, nausea, vomiting, dysphagia, diarrhea, constipation, polyuria, polydipsia, dysuria, hematuria, frequency, urgency, abnormal bleeding or bruising, fever, chills, unexpected weight changes, mood swings, change in skin or hair texture, change in voice quality, auditory or visual problems, allergic reactions or rashes, new musculoskeletal complaints.   PHYSICAL EXAM BP 128/84 mmHg  Pulse 71  Resp 20  Ht 5\' 1"  (1.549 m)  Wt 168 lb 1.6 oz (76.25 kg)  BMI 31.78 kg/m2  General: Alert, oriented x3, no distress Head: no evidence of trauma, PERRL, EOMI, no exophtalmos or lid lag, no myxedema, no xanthelasma; normal ears, nose and oropharynx Neck: normal jugular venous pulsations and no hepatojugular reflux; brisk carotid pulses without delay and no carotid bruits Chest: clear to auscultation, no signs of consolidation by percussion or palpation, normal fremitus, symmetrical and full respiratory excursions Cardiovascular: normal position and quality of the apical impulse, regular rhythm, normal first and second heart sounds, no murmurs, rubs or gallops Abdomen: no tenderness or distention, no masses by palpation, no abnormal pulsatility or arterial bruits, normal bowel sounds, no hepatosplenomegaly Extremities: no clubbing, cyanosis or edema; 2+ radial, ulnar and brachial pulses bilaterally; 2+ right femoral, posterior tibial and dorsalis pedis pulses; 2+ left femoral, posterior tibial and dorsalis pedis pulses; no subclavian or femoral bruits Neurological: grossly nonfocal  EKG: NSR, normal  Lipid Panel     Component Value Date/Time   CHOL 131 06/18/2014 1257   TRIG 125 06/18/2014 1257   HDL 46 06/18/2014 1257   CHOLHDL 2.8 06/18/2014 1257   VLDL 25 06/18/2014 1257   LDLCALC 60 06/18/2014 1257    BMET      Component Value Date/Time   NA 142 06/18/2014 1257   K 3.8 06/18/2014 1257   CL 101 06/18/2014 1257   CO2 32 06/18/2014 1257   GLUCOSE 92 06/18/2014 1257   BUN 14 06/18/2014 1257   CREATININE 0.93 06/18/2014 1257   CREATININE 0.84 11/09/2011 1348   CALCIUM 9.4 06/18/2014 1257   GFRNONAA 51* 04/03/2012 1022   GFRNONAA 66* 11/09/2011 1348   GFRAA 59* 04/03/2012 1022   GFRAA 77* 11/09/2011 1348     ASSESSMENT AND PLAN CAD She had only minor coronary atherosclerosis at the time of cardiac catheterization in 2008, but had biochemical evidence of a small myocardial infarction. Coronary vasospasm was suspected. 30-40% lesions were seen scattered throughout the coronary tree especially prominent in the mid right coronary and distal left circumflex coronary artery. Normal left ventricular systolic function. Normal 2010 nuclear perfusion study.  Newer complaints suggest a more severe and fixed coronary lesion, rather than coronary spasm. Unable to perform treadmill exercise. Schedule for [employer].Marland Kitchen  HYPERTENSION Good control on current medications no changes are planned  HYPERLIPIDEMIA Excellent lipid profile   Orders Placed This Encounter  Procedures  . Myocardial Perfusion Imaging  . EKG 12-Lead   Meds ordered this encounter  Medications  . nitroGLYCERIN (NITROLINGUAL) 0.4 MG/SPRAY spray    Sig: Place 1 spray under the tongue every 5 (five) minutes x 3 doses as needed for chest pain.    Dispense:  12 g    Refill:  1  . isosorbide mononitrate (IMDUR) 30 MG 24 hr tablet    Sig: Take 1 tablet (30 mg total) by mouth daily.    Dispense:  30 tablet    Refill:  [place] Montay Vanvoorhis, MD, [facility] [phone] office [phone] pager

## 2014-08-23 ENCOUNTER — Other Ambulatory Visit: Payer: Self-pay | Admitting: Family Medicine

## 2014-09-04 ENCOUNTER — Telehealth (HOSPITAL_COMMUNITY): Payer: Self-pay

## 2014-09-04 NOTE — Telephone Encounter (Signed)
Encounter complete. 

## 2014-09-10 ENCOUNTER — Ambulatory Visit (HOSPITAL_COMMUNITY)
Admission: RE | Admit: 2014-09-10 | Discharge: 2014-09-10 | Disposition: A | Discharge status: Home / Self Care | Payer: Medicare HMO | Source: Ambulatory Visit | Attending: Cardiovascular Disease | Admitting: Cardiovascular Disease

## 2014-09-10 DIAGNOSIS — Z87891 Personal history of nicotine dependence: Secondary | ICD-10-CM | POA: Diagnosis not present

## 2014-09-10 DIAGNOSIS — R110 Nausea: Secondary | ICD-10-CM | POA: Insufficient documentation

## 2014-09-10 DIAGNOSIS — R002 Palpitations: Secondary | ICD-10-CM | POA: Diagnosis not present

## 2014-09-10 DIAGNOSIS — R079 Chest pain, unspecified: Secondary | ICD-10-CM | POA: Diagnosis present

## 2014-09-10 DIAGNOSIS — R0609 Other forms of dyspnea: Secondary | ICD-10-CM | POA: Diagnosis not present

## 2014-09-10 DIAGNOSIS — R0602 Shortness of breath: Secondary | ICD-10-CM

## 2014-09-10 DIAGNOSIS — E669 Obesity, unspecified: Secondary | ICD-10-CM | POA: Diagnosis not present

## 2014-09-10 DIAGNOSIS — I10 Essential (primary) hypertension: Secondary | ICD-10-CM | POA: Diagnosis not present

## 2014-09-10 DIAGNOSIS — R42 Dizziness and giddiness: Secondary | ICD-10-CM | POA: Insufficient documentation

## 2014-09-10 DIAGNOSIS — R5383 Other fatigue: Secondary | ICD-10-CM | POA: Diagnosis not present

## 2014-09-10 DIAGNOSIS — Z8249 Family history of ischemic heart disease and other diseases of the circulatory system: Secondary | ICD-10-CM | POA: Insufficient documentation

## 2014-09-10 DIAGNOSIS — E785 Hyperlipidemia, unspecified: Secondary | ICD-10-CM | POA: Insufficient documentation

## 2014-09-10 MED ORDER — TECHNETIUM TC 99M SESTAMIBI GENERIC - CARDIOLITE
11.0000 | Freq: Once | INTRAVENOUS | Status: AC | PRN
  Administered 2014-09-10: 11 via INTRAVENOUS

## 2014-09-10 MED ORDER — AMINOPHYLLINE 25 MG/ML IV SOLN
75.0000 mg | Freq: Once | INTRAVENOUS | Status: AC
  Administered 2014-09-10: 75 mg via INTRAVENOUS

## 2014-09-10 MED ORDER — TECHNETIUM TC 99M SESTAMIBI GENERIC - CARDIOLITE
32.5000 | Freq: Once | INTRAVENOUS | Status: AC | PRN
  Administered 2014-09-10: 33 via INTRAVENOUS

## 2014-09-10 MED ORDER — REGADENOSON 0.4 MG/5ML IV SOLN
0.4000 mg | Freq: Once | INTRAVENOUS | Status: AC
  Administered 2014-09-10: 0.4 mg via INTRAVENOUS

## 2014-09-10 NOTE — Procedures (Addendum)
[facility] [address] [place] [address] [phone]  Cardiology Nuclear Med Study  [name] is a 81 y.o. female     MRN : [id number]     DOB: November 08, 1933  Procedure Date: 09/10/2014  Nuclear Med Background Indication for Stress Test:  Evaluation for Ischemia History:  CAD;Last NUC MPI on 11/12/2008-normal;EF=71%; Cardiac Risk Factors: Family History - CAD, History of Smoking, Hypertension, Lipids and Obesity  Symptoms:  Chest Pain, DOE, Fatigue, Light-Headedness, Nausea and Palpitations   Nuclear Pre-Procedure Caffeine/Decaff Intake:  1:00am NPO After: 11am   IV Site: R Forearm  IV 0.9% NS with Angio Cath:  22g  Chest Size (in):  n/a IV Started by: Rolene Course, RN  Height: 5\' 1"  (1.549 m)  Cup Size: C  BMI:  Body mass index is 31.76 kg/(m^2). Weight:  168 lb (76.204 kg)   Tech Comments:  n/a    Nuclear Med Study 1 or 2 day study: 1 day  Stress Test Type:  [place] Provider:  Sanda Klein, MD   Resting Radionuclide: Technetium 59m Sestamibi  Resting Radionuclide Dose: 11.0 mCi   Stress Radionuclide:  Technetium 16m Sestamibi  Stress Radionuclide Dose: 32.5 mCi           Stress Protocol Rest HR: 75 Stress HR: 96  Rest BP: 128/82 Stress BP: 136/76  Exercise Time (min): n/a METS: n/a   Predicted Max HR: 142 bpm % Max HR: 67.61 bpm Rate Pressure Product: 13056  Dose of Adenosine (mg):  n/a Dose of Lexiscan: 0.4 mg  Dose of Atropine (mg): n/a Dose of Dobutamine: n/a mcg/kg/min (at max HR)  Stress Test Technologist: Leane Para, CCT Nuclear Technologist: Imagene Riches, CNMT   Rest Procedure:  Myocardial perfusion imaging was performed at rest 45 minutes following the intravenous administration of Technetium 58m Sestamibi. Stress Procedure:  The patient received IV Lexiscan 0.4 mg over 15-seconds.  Technetium 18m Sestamibi injected IV at 30-seconds.  Patient experienced SOB and  Stomach Cramps and 75 mg Aminophylline IV was administered.  There were no significant changes with Lexiscan.  Quantitative spect images were obtained after a 45 minute delay.  Transient Ischemic Dilatation (Normal <1.22):  0.95  QGS EDV:  48 ml QGS ESV:  11 ml LV Ejection Fraction: 76%   Rest ECG: NSR with non-specific ST-T wave changes  Stress ECG: No significant ST segment change suggestive of ischemia.  QPS Raw Data Images:  There is interference from nuclear activity from structures below the diaphragm. This does not affect the ability to read the study. Stress Images:  Normal homogeneous uptake in all areas of the myocardium. Rest Images:  Normal homogeneous uptake in all areas of the myocardium. Subtraction (SDS):  No evidence of ischemia.  Impression Exercise Capacity:  Lexiscan with no exercise. BP Response:  Normal blood pressure response. Clinical Symptoms:  There is dyspnea. ECG Impression:  No significant ST segment change suggestive of ischemia. Comparison with Prior Nuclear Study: Compared to 11/12/08, no change  Overall Impression:  Normal stress nuclear study.  LV Wall Motion:  NL LV Function; NL Wall Motion   Kirk Ruths, MD  09/10/2014 5:09 PM

## 2014-09-13 ENCOUNTER — Other Ambulatory Visit: Payer: Self-pay | Admitting: Family Medicine

## 2014-09-13 DIAGNOSIS — Z1231 Encounter for screening mammogram for malignant neoplasm of breast: Secondary | ICD-10-CM

## 2014-10-03 ENCOUNTER — Ambulatory Visit (INDEPENDENT_AMBULATORY_CARE_PROVIDER_SITE_OTHER): Payer: Medicare HMO | Admitting: Cardiovascular Disease

## 2014-10-03 ENCOUNTER — Encounter: Payer: Self-pay | Admitting: Cardiovascular Disease

## 2014-10-03 VITALS — BP 106/64 | HR 69 | Resp 16 | Ht 62.0 in | Wt 169.2 lb

## 2014-10-03 DIAGNOSIS — I10 Essential (primary) hypertension: Secondary | ICD-10-CM

## 2014-10-03 DIAGNOSIS — I25111 Atherosclerotic heart disease of native coronary artery with angina pectoris with documented spasm: Secondary | ICD-10-CM

## 2014-10-03 DIAGNOSIS — E785 Hyperlipidemia, unspecified: Secondary | ICD-10-CM

## 2014-10-03 NOTE — Patient Instructions (Signed)
STOP the Hydrochlorothiazide.  Dr. Sallyanne Kuster recommends that you schedule a follow-up appointment in: One year.

## 2014-10-04 ENCOUNTER — Encounter: Payer: Self-pay | Admitting: Cardiovascular Disease

## 2014-10-04 NOTE — Progress Notes (Signed)
Patient ID: [name], female   DOB: 1934/02/06, 81 y.o.   MRN: [id number]      Reason for office visit CAD  [name] returns in follow-up after undergoing a stress test for it appeared to be a pattern of increasing frequency angina pectoris. The nuclear stress test was reassuringly normal. Over the last 8 weeks she has only required sublingual letter glycerin twice. She has suspected coronary vasospasm. She complains of substantial fatigue and her systolic blood pressures running towards the low end of normal.  She has a history of coronary artery disease and vasospastic angina.In 2011 she had a non-ST elevation MI type 2 due to demand ischemia. Cardiac cath was done at that time with minimal coronary atherosclerosis (30-40% lesions were seen scattered throughout the coronary tree especially prominent in the mid right coronary and distal left circumflex coronary artery) resulting in presupmtive diagnosis of vasospastic angina/infarction was made. Has been treated for coronary disease with beta blocker and statin since that time.    Allergies  Allergen Reactions  . Ace Inhibitors Anaphylaxis    Was on ventilator for six days due to airway swelling shut while on ACE inhibitors.  . Bupropion Hcl Anaphylaxis  . Codeine Anaphylaxis  . Penicillins Anaphylaxis  . Angiotensin Receptor Blockers     Patient unable to recall what medication this might have been and/or her reaction to it.    Current Outpatient Prescriptions  Medication Sig Dispense Refill  . amLODipine (NORVASC) 10 MG tablet take 1 tablet by mouth once daily 30 tablet 5  . Ascorbic Acid (VITAMIN C) 500 MG tablet Take 500 mg by mouth daily.     Marland Kitchen aspirin 81 MG tablet Take 81 mg by mouth daily.    Marland Kitchen atorvastatin (LIPITOR) 20 MG tablet take 1 tablet by mouth at bedtime 90 tablet 1  . calcium carbonate (OS-CAL - DOSED IN MG OF ELEMENTAL CALCIUM) 1250 MG tablet Take 1 tablet by mouth daily.    . Cyanocobalamin (B-12)  1000 MCG CAPS Take 1 capsule by mouth daily.    . diclofenac sodium (VOLTAREN) 1 % GEL Apply 1 application topically 4 (four) times daily. 100 g 4  . EPIPEN 2-PAK 0.3 MG/0.3ML SOAJ injection INJECT AS DIRECTED FOR SEVERE ALLERGIC REACTIONS 2 Device 1  . ferrous sulfate 325 (65 FE) MG tablet Take 325 mg by mouth daily with breakfast.     . FLUoxetine (PROZAC) 20 MG tablet take 1 and 1/2 tablet by mouth once daily 45 tablet 3  . isosorbide mononitrate (IMDUR) 30 MG 24 hr tablet Take 1 tablet (30 mg total) by mouth daily. 30 tablet 6  . KLOR-CON 10 10 MEQ tablet take 1 tablet twice a day 60 tablet 5  . levothyroxine (SYNTHROID, LEVOTHROID) 25 MCG tablet take 1 tablet by mouth every morning ON AN EMPTY STOMACH 90 tablet 0  . Multiple Vitamin (MULITIVITAMIN WITH MINERALS) TABS Take 1 tablet by mouth daily.    . nitroGLYCERIN (NITROLINGUAL) 0.4 MG/SPRAY spray Place 1 spray under the tongue every 5 (five) minutes x 3 doses as needed for chest pain. 12 g 1  . omeprazole (PRILOSEC) 40 MG capsule take 1 capsule by mouth once daily 30 capsule 5  . pyridoxine (B-6) 200 MG tablet Take 200 mg by mouth 2 (two) times daily.     Marland Kitchen ZOVIRAX 5 % APPLY TOPICALLY EVERY 3 HOURS. 30 g 1   Current Facility-Administered Medications  Medication Dose Route Frequency Provider Last Rate Last Dose  .  Influenza (>/= 3 years) inactive virus vaccine (FLVIRIN/FLUZONE) injection SUSP 0.5 mL  0.5 mL Intramuscular Once Fayrene Helper, MD        Past Medical History  Diagnosis Date  . Hyperlipidemia     takes Lipitor nightly  . Obesity   . DJD (degenerative joint disease)     Severe  . CAD (coronary artery disease)   . Chronic back pain   . Carpal tunnel syndrome on left     Left Hand pain with numbness, new onset   . Difficult intubation     small airway  . Hypertension     takes Amlodipine and Metoprolol daily  . Blood clot associated with vein wall inflammation     hx of in right leg  . Sleep apnea     sleep  study done 72yrs ago  . Shortness of breath     with exertion  . Pneumonia     hx of-in 2011  . Headache(784.0)     occasionally  . Chronic back pain     buldging disc  . GERD (gastroesophageal reflux disease)     takes Omeprazole daily  . Gastric ulcer     hx of   . Constipation   . Diarrhea   . Nocturia   . Anemia   . Thyroid mass     right    Past Surgical History  Procedure Laterality Date  . Appendectomy  1987  . Carpal tunnel release right      x 2  . Partial hysterectomy  1972  . Arthroscopy left shoulder  1999  . Arthoscopy left knee  1992  . Back  surgery  lumbar  1989 /1999    x 2  . Tonsillectomy    . Rotator cuff repair      right  . Colonoscopy    . Esophagogastroduodenoscopy    . Cholecystectomy    . Thyroidectomy  11/17/2011    Procedure: THYROIDECTOMY;  Surgeon: Ascencion Dike, MD;  Location: [facility];  Service: ENT;  Laterality: Right;  WITH FROZEN SECTION  . Cardiac catheterization  05/2010  . Abdominal hysterectomy    . Back surgery    . Nm myocar perf wall motion  11/12/2008    no ischemia  . Spine surgery    . Lumbar fusion  March 19, 2014    Family History  Problem Relation Age of Onset  . Anuerysm Mother   . Anesthesia problems Neg Hx   . Hypotension Neg Hx   . Malignant hyperthermia Neg Hx   . Pseudochol deficiency Neg Hx     History   Social History  . Marital Status: Widowed    Spouse Name: N/A    Number of Children: 4  . Years of Education: N/A   Occupational History  .     Social History Main Topics  . Smoking status: Former Smoker    Types: Cigarettes    Quit date: 09/06/1984  . Smokeless tobacco: Not on file  . Alcohol Use: No     Comment: Casual  . Drug Use: No  . Sexual Activity: No   Other Topics Concern  . Not on file   Social History Narrative    Review of systems: Rare episodes of chest discomfort requiring nitroglycerin, biggest complaint is fatigue, denies shortness of breath. The patient specifically  denies  dyspnea at rest or with exertion, orthopnea, paroxysmal nocturnal dyspnea, syncope, palpitations, focal neurological deficits, intermittent claudication, lower extremity edema,  unexplained weight gain, cough, hemoptysis or wheezing.  The patient also denies abdominal pain, nausea, vomiting, dysphagia, diarrhea, constipation, polyuria, polydipsia, dysuria, hematuria, frequency, urgency, abnormal bleeding or bruising, fever, chills, unexpected weight changes, mood swings, change in skin or hair texture, change in voice quality, auditory or visual problems, allergic reactions or rashes, new musculoskeletal complaints other than usual "aches and pains".   PHYSICAL EXAM BP 106/64 mmHg  Pulse 69  Resp 16  Ht 5\' 2"  (1.575 m)  Wt 169 lb 3.2 oz (76.749 kg)  BMI 30.94 kg/m2  General: Alert, oriented x3, no distress Head: no evidence of trauma, PERRL, EOMI, no exophtalmos or lid lag, no myxedema, no xanthelasma; normal ears, nose and oropharynx Neck: normal jugular venous pulsations and no hepatojugular reflux; brisk carotid pulses without delay and no carotid bruits Chest: clear to auscultation, no signs of consolidation by percussion or palpation, normal fremitus, symmetrical and full respiratory excursions Cardiovascular: normal position and quality of the apical impulse, regular rhythm, normal first and second heart sounds, no murmurs, rubs or gallops Abdomen: no tenderness or distention, no masses by palpation, no abnormal pulsatility or arterial bruits, normal bowel sounds, no hepatosplenomegaly Extremities: no clubbing, cyanosis or edema; 2+ radial, ulnar and brachial pulses bilaterally; 2+ right femoral, posterior tibial and dorsalis pedis pulses; 2+ left femoral, posterior tibial and dorsalis pedis pulses; no subclavian or femoral bruits Neurological: grossly nonfocal  Lipid Panel     Component Value Date/Time   CHOL 131 06/18/2014 1257   TRIG 125 06/18/2014 1257   HDL 46 06/18/2014  1257   CHOLHDL 2.8 06/18/2014 1257   VLDL 25 06/18/2014 1257   LDLCALC 60 06/18/2014 1257    BMET    Component Value Date/Time   NA 142 06/18/2014 1257   K 3.8 06/18/2014 1257   CL 101 06/18/2014 1257   CO2 32 06/18/2014 1257   GLUCOSE 92 06/18/2014 1257   BUN 14 06/18/2014 1257   CREATININE 0.93 06/18/2014 1257   CREATININE 0.84 11/09/2011 1348   CALCIUM 9.4 06/18/2014 1257   GFRNONAA 51* 04/03/2012 1022   GFRNONAA 66* 11/09/2011 1348   GFRAA 59* 04/03/2012 1022   GFRAA 77* 11/09/2011 1348     ASSESSMENT AND PLAN  Asst. Schueler recent nuclear perfusion study suggests that she has not developed progression of her moderate coronary atherosclerosis. Suspect her occasional symptoms are related to coronary vasospasm which has been suspected in the past. We'll continue treatment with amlodipine and long-acting nitrates but will try to stop her hydrochlorothiazide since she has fatigue. Her blood pressure is rather low. Discussed the fact that discontinuing the hydrochlorothiazide may lead to a recurrence of ankle swelling, a common side effect of amlodipine. Asked her to be very judicious in her use of sodium chloride. Her coronary risk factors appear to be well addressed, with the exception of obesity.    Kaitlyn Humbles, MD, [place] [phone] office [phone] pager

## 2014-10-16 ENCOUNTER — Ambulatory Visit (HOSPITAL_COMMUNITY)
Admission: RE | Admit: 2014-10-16 | Discharge: 2014-10-16 | Disposition: A | Discharge status: Home / Self Care | Payer: Medicare HMO | Source: Ambulatory Visit | Attending: Family Medicine | Admitting: Family Medicine

## 2014-10-16 DIAGNOSIS — Z1231 Encounter for screening mammogram for malignant neoplasm of breast: Secondary | ICD-10-CM | POA: Diagnosis present

## 2014-10-21 ENCOUNTER — Other Ambulatory Visit: Payer: Self-pay | Admitting: Family Medicine

## 2014-10-24 ENCOUNTER — Encounter: Payer: Medicare HMO | Admitting: Family Medicine

## 2014-11-22 ENCOUNTER — Other Ambulatory Visit: Payer: Self-pay | Admitting: Family Medicine

## 2014-11-22 ENCOUNTER — Telehealth: Payer: Self-pay | Admitting: Family Medicine

## 2014-11-22 MED ORDER — FLUOXETINE HCL 20 MG PO TABS: ORAL_TABLET | ORAL | Status: DC

## 2014-11-22 NOTE — Telephone Encounter (Signed)
Med sent.

## 2014-12-03 ENCOUNTER — Other Ambulatory Visit: Payer: Self-pay | Admitting: Family Medicine

## 2014-12-12 ENCOUNTER — Telehealth: Payer: Self-pay | Admitting: *Deleted

## 2014-12-12 NOTE — Telephone Encounter (Signed)
Pt called [facility] requesting a call back. Please advise

## 2014-12-13 ENCOUNTER — Other Ambulatory Visit: Payer: Self-pay

## 2014-12-13 DIAGNOSIS — M25561 Pain in right knee: Secondary | ICD-10-CM

## 2014-12-13 NOTE — Telephone Encounter (Signed)
Wanted to speak with [name].

## 2014-12-20 ENCOUNTER — Other Ambulatory Visit: Payer: Self-pay | Admitting: Family Medicine

## 2014-12-28 ENCOUNTER — Other Ambulatory Visit: Payer: Self-pay | Admitting: Family Medicine

## 2015-01-07 ENCOUNTER — Telehealth: Payer: Self-pay | Admitting: Family Medicine

## 2015-01-08 NOTE — Telephone Encounter (Signed)
Noted  

## 2015-02-13 ENCOUNTER — Other Ambulatory Visit: Payer: Self-pay | Admitting: Family Medicine

## 2015-02-14 ENCOUNTER — Telehealth: Payer: Self-pay | Admitting: Cardiovascular Disease

## 2015-02-14 NOTE — Telephone Encounter (Signed)
°  1. Which medications need to be refilled? Nitroglycerin spray-chest pain when she walks a long distance  2. Which pharmacy is medication to be sent to?[facility]-[phone]  3. Do they need a 30 day or 90 day supply? 1 bottle  4. Would they like a call back once the medication has been sent to the pharmacy? yes e

## 2015-02-14 NOTE — Telephone Encounter (Signed)
Spoke to patient   she states she lost her NTG spray. RN informed patient she still have has a refilled on her current prescription to call pharmacy for refill. Patient states she was not aware and will call pharmacy. RN asked patient about her chest discomfort - if an appointment is needed with Dr Sallyanne Kuster or extender. She states she prefer only to see Dr Sallyanne Kuster and will call back if needed.

## 2015-02-18 ENCOUNTER — Telehealth: Payer: Self-pay | Admitting: *Deleted

## 2015-02-18 ENCOUNTER — Other Ambulatory Visit: Payer: Self-pay | Admitting: Family Medicine

## 2015-02-18 MED ORDER — POTASSIUM CHLORIDE ER 10 MEQ PO TBCR: 10.0000 meq | EXTENDED_RELEASE_TABLET | Freq: Two times a day (BID) | ORAL | Status: DC

## 2015-02-18 NOTE — Telephone Encounter (Signed)
Pt called stating she needs her potassium pills refilled. Please advise

## 2015-03-06 ENCOUNTER — Other Ambulatory Visit: Payer: Self-pay | Admitting: Family Medicine

## 2015-03-06 LAB — LIPID PANEL
Cholesterol: 163 mg/dL (ref 0–200)
HDL: 52 mg/dL (ref >=46)
LDL Cholesterol: 80 mg/dL (ref 0–99)
Total CHOL/HDL Ratio: 3.1 Ratio
Triglycerides: 155 mg/dL — ABNORMAL HIGH (ref <150)
VLDL: 31 mg/dL (ref 0–40)

## 2015-03-06 LAB — COMPREHENSIVE METABOLIC PANEL
ALT: 19 U/L (ref 0–35)
AST: 26 U/L (ref 0–37)
Albumin: 3.9 g/dL (ref 3.5–5.2)
Alkaline Phosphatase: 96 U/L (ref 39–117)
BUN: 14 mg/dL (ref 6–23)
CO2: 29 mEq/L (ref 19–32)
Calcium: 9.4 mg/dL (ref 8.4–10.5)
Chloride: 102 mEq/L (ref 96–112)
Creat: 0.98 mg/dL (ref 0.50–1.10)
Glucose, Bld: 94 mg/dL (ref 70–99)
Potassium: 4.2 mEq/L (ref 3.5–5.3)
Sodium: 143 mEq/L (ref 135–145)
Total Bilirubin: 0.4 mg/dL (ref 0.2–1.2)
Total Protein: 6.9 g/dL (ref 6.0–8.3)

## 2015-03-07 ENCOUNTER — Other Ambulatory Visit: Payer: Self-pay | Admitting: *Deleted

## 2015-03-07 ENCOUNTER — Other Ambulatory Visit: Payer: Self-pay | Admitting: Family Medicine

## 2015-03-07 LAB — TSH: TSH: 4.807 u[IU]/mL — ABNORMAL HIGH (ref 0.350–4.500)

## 2015-03-07 MED ORDER — ISOSORBIDE MONONITRATE ER 30 MG PO TB24: 30.0000 mg | ORAL_TABLET | Freq: Every day | ORAL | Status: DC

## 2015-03-07 NOTE — Telephone Encounter (Signed)
Rx(s) sent to pharmacy electronically.  

## 2015-03-17 ENCOUNTER — Encounter: Payer: Self-pay | Admitting: Family Medicine

## 2015-03-17 ENCOUNTER — Ambulatory Visit (INDEPENDENT_AMBULATORY_CARE_PROVIDER_SITE_OTHER): Payer: Medicare HMO | Admitting: Family Medicine

## 2015-03-17 VITALS — BP 120/82 | HR 83 | Resp 16 | Ht 59.75 in | Wt 174.0 lb

## 2015-03-17 DIAGNOSIS — R7309 Other abnormal glucose: Secondary | ICD-10-CM | POA: Diagnosis not present

## 2015-03-17 DIAGNOSIS — R7303 Prediabetes: Secondary | ICD-10-CM

## 2015-03-17 DIAGNOSIS — I2510 Atherosclerotic heart disease of native coronary artery without angina pectoris: Secondary | ICD-10-CM

## 2015-03-17 DIAGNOSIS — Z0000 Encounter for general adult medical examination without abnormal findings: Secondary | ICD-10-CM

## 2015-03-17 DIAGNOSIS — I10 Essential (primary) hypertension: Secondary | ICD-10-CM

## 2015-03-17 DIAGNOSIS — F32A Depression, unspecified: Secondary | ICD-10-CM

## 2015-03-17 DIAGNOSIS — F329 Major depressive disorder, single episode, unspecified: Secondary | ICD-10-CM | POA: Diagnosis not present

## 2015-03-17 DIAGNOSIS — Z23 Encounter for immunization: Secondary | ICD-10-CM | POA: Insufficient documentation

## 2015-03-17 DIAGNOSIS — M25561 Pain in right knee: Secondary | ICD-10-CM

## 2015-03-17 DIAGNOSIS — E669 Obesity, unspecified: Secondary | ICD-10-CM | POA: Insufficient documentation

## 2015-03-17 DIAGNOSIS — M79604 Pain in right leg: Secondary | ICD-10-CM

## 2015-03-17 MED ORDER — FLUOXETINE HCL 20 MG PO TABS: ORAL_TABLET | ORAL | Status: DC

## 2015-03-17 NOTE — Assessment & Plan Note (Signed)
Controlled, no change in medication DASH diet and commitment to daily physical activity for a minimum of 30 minutes discussed and encouraged, as a part of hypertension management. The importance of attaining a healthy weight is also discussed.  BP/Weight 03/17/2015 10/03/2014 09/10/2014 08/14/2014 06/25/2014 02/27/2014 12/13/8117  Systolic BP 147 829 - 562 130 865 784  Diastolic BP 82 64 - 84 80 82 84  Wt. (Lbs) 174 169.2 168 168.1 174.4 171 176.8  BMI 34.25 30.94 31.76 31.78 31.89 31.27 33.42

## 2015-03-17 NOTE — Assessment & Plan Note (Signed)
Marked improvement following back surgery

## 2015-03-17 NOTE — Assessment & Plan Note (Signed)
After obtaining informed consent, the vaccine is  administered by LPN.  

## 2015-03-17 NOTE — Progress Notes (Signed)
Subjective:    Patient ID: [name], female    DOB: 1934-04-08, 81 y.o.   MRN: [id number]  HPI  Preventive Screening-Counseling & Management   Patient present here today for a Medicare annual wellness visit.   Current Problems (verified)   Medications Prior to Visit Allergies (verified)   PAST HISTORY  Family History (verified)   Social History Widow, 4 daughter, all living. Lives alone, former smoker, quit in [place] Factors  Current exercise habits: Goes up/down steps daily and walks and rides her bicycle when able    Dietary issues discussed: Heart healthy diet discussed, limits fried foods and carbs and eats mainly fruits and vegetables    Cardiac risk factors: Established CAD, hTN and hyperlipidemia  Depression Screen  (Note: if answer to either of the following is "Yes", a more complete depression screening is indicated)   Over the past two weeks, have you felt down, depressed or hopeless? A little  , mental illness in close family members which is not being treated is stressful Over the past two weeks, have you felt little interest or pleasure in doing things? No  Have you lost interest or pleasure in daily life? No  Do you often feel hopeless? No  Do you cry easily over simple problems? No   Activities of Daily Living  In your present state of health, do you have any difficulty performing the following activities?  Driving?: No Managing money?: No Feeding yourself?:No Getting from bed to chair?:No Climbing a flight of stairs?:No Preparing food and eating?:No Bathing or showering?:No Getting dressed?:No Getting to the toilet?:No Using the toilet?:No Moving around from place to place?: No  Fall Risk Assessment In the past year have you fallen or had a near fall?:No Are you currently taking any medications that make you dizzy?:No   Hearing Difficulties: No Do you often ask people to speak up or repeat themselves?:No Do you experience  ringing or noises in your ears?:No Do you have difficulty understanding soft or whispered voices?:No  Cognitive Testing  Alert? Yes Normal Appearance?Yes  Oriented to person? Yes Place? Yes  Time? Yes  Displays appropriate judgment?Yes  Can read the correct time from a watch face? yes Are you having problems remembering things? A little   Advanced Directives have been discussed with the patient?Yes, no living will, will provide information .   List the Names of Other Physician/Practitioners you currently use:  Dr Sallyanne Kuster (cardiology)   Indicate any recent Medical Services you may have received from other than [facility] providers in the past year (date may be approximate).   Assessment:    Annual Wellness Exam   Plan:    During the course of the visit the patient was educated and counseled about appropriate screening and preventive services including:  A healthy diet is rich in fruit, vegetables and whole grains. Poultry fish, nuts and beans are a healthy choice for protein rather then red meat. A low sodium diet and drinking 64 ounces of water daily is generally recommended. Oils and sweet should be limited. Carbohydrates especially for those who are diabetic or overweight, should be limited to 30-45 gram per meal. It is important to eat on a regular schedule, at least 3 times daily. Snacks should be primarily fruits, vegetables or nuts. It is important that you exercise regularly at least 30 minutes 5 times a week. If you develop chest pain, have severe difficulty breathing, or feel very tired, stop exercising immediately and seek medical attention  Immunization reviewed and updated. Cancer screening reviewed and updated    Patient Instructions (the written plan) was given to the patient.  Medicare Attestation  I have personally reviewed:  The patient's medical and social history  Their use of alcohol, tobacco or illicit drugs  Their current medications and supplements  The  patient's functional ability including ADLs,fall risks, home safety risks, cognitive, and hearing and visual impairment  Diet and physical activities  Evidence for depression or mood disorders  The patient's weight, height, BMI, and visual acuity have been recorded in the chart. I have made referrals, counseling, and provided education to the patient based on review of the above and I have provided the patient with a written personalized care plan for preventive services.     Review of Systems     Objective:   Physical Exam BP 120/82 mmHg  Pulse 83  Resp 16  Ht 4' 11.75" (1.518 m)  Wt 174 lb (78.926 kg)  BMI 34.25 kg/m2  SpO2 98%        Assessment & Plan:  Medicare annual wellness visit, subsequent Annual exam as documented. Counseling done  re healthy lifestyle involving commitment to 150 minutes exercise per week, heart healthy diet, and attaining healthy weight.The importance of adequate sleep also discussed. Regular seat belt use and home safety, is also discussed. Changes in health habits are decided on by the patient with goals and time frames  set for achieving them. Immunization and cancer screening needs are specifically addressed at this visit.   Need for vaccination with 13-polyvalent pneumococcal conjugate vaccine After obtaining informed consent, the vaccine is  administered by LPN.   Depression Controlled on current medication, however, new additional stresses of failing health in close family members, has her experiencing some increased symptoms of anxiety and depression, not suicidal or homicidal, i have encouraged her to resume a hobby , which she states she actually stopped since her Dad's apssing Has not been interested in therapy when she was much worse, none offered at this time  BACK PAIN WITH RADICULOPATHY Marked improvement in pain and mobility following most recent surgery in 2015. No fall history. Home safety and fall precautions briefly  reviewed  Prediabetes Patient educated about the importance of limiting  Carbohydrate intake , the need to commit to daily physical activity for a minimum of 30 minutes , and to commit weight loss. The fact that changes in all these areas will reduce or eliminate all together the development of diabetes is stressed.   Diabetic Labs Latest Ref Rng 03/06/2015 06/18/2014 11/20/2013 08/13/2013 04/11/2013  HbA1c <5.7 % - 5.5 6.0(H) 5.9(H) 5.8(H)  Chol 0 - 200 mg/dL 163 131 - 134 -  HDL >=46 mg/dL 52 46 - 49 -  Calc LDL 0 - 99 mg/dL 80 60 - 65 -  Triglycerides <150 mg/dL 155(H) 125 - 99 -  Creatinine 0.50 - 1.10 mg/dL 0.98 0.93 1.18(H) 1.15(H) 1.10   BP/Weight 03/17/2015 10/03/2014 09/10/2014 08/14/2014 06/25/2014 02/27/2014 6/78/9381  Systolic BP 017 510 - 258 527 782 423  Diastolic BP 82 64 - 84 80 82 84  Wt. (Lbs) 174 169.2 168 168.1 174.4 171 176.8  BMI 34.25 30.94 31.76 31.78 31.89 31.27 33.42   No flowsheet data found. Updated lab needed at/ before next visit.     Essential hypertension Controlled, no change in medication DASH diet and commitment to daily physical activity for a minimum of 30 minutes discussed and encouraged, as a part of hypertension management. The  importance of attaining a healthy weight is also discussed.  BP/Weight 03/17/2015 10/03/2014 09/10/2014 08/14/2014 06/25/2014 02/27/2014 2/62/0355  Systolic BP 974 163 - 845 364 680 321  Diastolic BP 82 64 - 84 80 82 84  Wt. (Lbs) 174 169.2 168 168.1 174.4 171 176.8  BMI 34.25 30.94 31.76 31.78 31.89 31.27 33.42        Coronary atherosclerosis Denies any current or recent chest dicomfort , or poor exercise tolerance, will ensure that her cardiology eval is up to date  Leg pain, right Marked improvement following back surgery  Right knee pain Marked improvement, with no report of instability, following back surgery  Obesity Deteriorated. Patient re-educated about  the importance of commitment to a  minimum of 150  minutes of exercise per week.  The importance of healthy food choices with portion control discussed. Encouraged to start a food diary, count calories and to consider  joining a support group. Sample diet sheets offered. Goals set by the patient for the next several months.   Weight /BMI 03/17/2015 10/03/2014 09/10/2014  WEIGHT 174 lb 169 lb 3.2 oz 168 lb  HEIGHT 4' 11.75" 5\' 2"  5\' 1"   BMI 34.25 kg/m2 30.94 kg/m2 31.76 kg/m2    Current exercise per week 60 mins

## 2015-03-17 NOTE — Patient Instructions (Addendum)
F/U in 4 months,call ifyou need me before  Please work on reading regularly, and try to learn new things/hobbies to stimulate your brain so memory stays good .  It is important that you exercise regularly at least 30 minutes 7 times a week. If you develop chest pain, have severe difficulty breathing, or feel very tired, stop exercising immediately and seek medical attention   Prevnar today  Please get eye exam as we discussed, vision is impaired in the left eye  Please work on good  health habits so that your health will improve. 1. Commitment to daily physical activity for 30 to 60  minutes, if you are able to do this.  2. Commitment to wise food choices. Aim for half of your  food intake to be vegetable and fruit, one quarter starchy foods, and one quarter protein. Try to eat on a regular schedule  3 meals per day, snacking between meals should be limited to vegetables or fruits or small portions of nuts. 64 ounces of water per day is generally recommended, unless you have specific health conditions, like heart failure or kidney failure where you will need to limit fluid intake.  3. Commitment to sufficient and a  good quality of physical and mental rest daily, generally between 6 to 8 hours per day.  WITH PERSISTANCE AND PERSEVERANCE, THE IMPOSSIBLE , BECOMES THE NORM!   Thanks for choosing [facility], we consider it a privelige to serve you.

## 2015-03-17 NOTE — Assessment & Plan Note (Signed)
Controlled on current medication, however, new additional stresses of failing health in close family members, has her experiencing some increased symptoms of anxiety and depression, not suicidal or homicidal, i have encouraged her to resume a hobby , which she states she actually stopped since her Dad's apssing Has not been interested in therapy when she was much worse, none offered at this time

## 2015-03-17 NOTE — Assessment & Plan Note (Signed)

## 2015-03-17 NOTE — Assessment & Plan Note (Signed)
Marked improvement in pain and mobility following most recent surgery in 2015. No fall history. Home safety and fall precautions briefly reviewed

## 2015-03-17 NOTE — Assessment & Plan Note (Signed)
Deteriorated. Patient re-educated about  the importance of commitment to a  minimum of 150 minutes of exercise per week.  The importance of healthy food choices with portion control discussed. Encouraged to start a food diary, count calories and to consider  joining a support group. Sample diet sheets offered. Goals set by the patient for the next several months.   Weight /BMI 03/17/2015 10/03/2014 09/10/2014  WEIGHT 174 lb 169 lb 3.2 oz 168 lb  HEIGHT 4' 11.75" 5\' 2"  5\' 1"   BMI 34.25 kg/m2 30.94 kg/m2 31.76 kg/m2    Current exercise per week 60 mins

## 2015-03-17 NOTE — Assessment & Plan Note (Signed)
Marked improvement, with no report of instability, following back surgery

## 2015-03-17 NOTE — Assessment & Plan Note (Signed)
Patient educated about the importance of limiting  Carbohydrate intake , the need to commit to daily physical activity for a minimum of 30 minutes , and to commit weight loss. The fact that changes in all these areas will reduce or eliminate all together the development of diabetes is stressed.   Diabetic Labs Latest Ref Rng 03/06/2015 06/18/2014 11/20/2013 08/13/2013 04/11/2013  HbA1c <5.7 % - 5.5 6.0(H) 5.9(H) 5.8(H)  Chol 0 - 200 mg/dL 163 131 - 134 -  HDL >=46 mg/dL 52 46 - 49 -  Calc LDL 0 - 99 mg/dL 80 60 - 65 -  Triglycerides <150 mg/dL 155(H) 125 - 99 -  Creatinine 0.50 - 1.10 mg/dL 0.98 0.93 1.18(H) 1.15(H) 1.10   BP/Weight 03/17/2015 10/03/2014 09/10/2014 08/14/2014 06/25/2014 02/27/2014 05/26/1006  Systolic BP 121 975 - 883 254 982 641  Diastolic BP 82 64 - 84 80 82 84  Wt. (Lbs) 174 169.2 168 168.1 174.4 171 176.8  BMI 34.25 30.94 31.76 31.78 31.89 31.27 33.42   No flowsheet data found. Updated lab needed at/ before next visit.

## 2015-03-17 NOTE — Assessment & Plan Note (Signed)
Denies any current or recent chest dicomfort , or poor exercise tolerance, will ensure that her cardiology eval is up to date

## 2015-04-28 ENCOUNTER — Telehealth: Payer: Self-pay | Admitting: *Deleted

## 2015-04-28 ENCOUNTER — Encounter: Payer: Medicare HMO | Admitting: Family Medicine

## 2015-04-28 NOTE — Telephone Encounter (Signed)
pls advise OTC claritin one daily, saline nasal flushes oOTC and sudafed ione daily for next 3 to 5 days, also of fluids

## 2015-04-28 NOTE — Telephone Encounter (Signed)
Patient states that she has sinus congestion x 1 week with hoarseness and sinus pressure.  Has not tried anything otc because of medical history.  Please advise.

## 2015-04-28 NOTE — Telephone Encounter (Signed)
Pt called stating she has a sinus infection and she has had it about a week now, pt said it started when she was in [place] pt is requesting something to be called in to [facility] on [address] pt said she has congestion [phone]

## 2015-04-30 NOTE — Telephone Encounter (Signed)
Patient aware.

## 2015-05-01 ENCOUNTER — Other Ambulatory Visit: Payer: Self-pay

## 2015-05-01 ENCOUNTER — Telehealth: Payer: Self-pay | Admitting: *Deleted

## 2015-05-01 MED ORDER — OLOPATADINE HCL 0.1 % OP SOLN: 1.0000 [drp] | Freq: Three times a day (TID) | OPHTHALMIC | Status: DC

## 2015-05-01 NOTE — Telephone Encounter (Signed)
Patient states that she is going to try previous advise of trying the Sudafed and Claritin along with the sinus rinses.  She is concerned about left eye redness now which she states is on the same side that she is having the most congestion.  Would like to know if some eye drops can be sent in to the pharmacy.  Please advise if you agree with Patanol?

## 2015-05-01 NOTE — Telephone Encounter (Signed)
Pt called back stating the left side of her nose is stopped up, and the left eye is red. Pt said she has been having trouble with sinus for about 2 weeks, got worse yesterday and today her eye is red. Please advise

## 2015-05-01 NOTE — Telephone Encounter (Signed)
pls document no pain or purulent drainage or loss of vision in affected eye, if no to all of therses, send patanol 1 drop 3 times daily as needed to affected eye for 1 week then as needed, no refill on med

## 2015-05-01 NOTE — Telephone Encounter (Signed)
Pt called lmom requesting a return call I tried to call patient twice line is busy. [phone]

## 2015-05-01 NOTE — Telephone Encounter (Signed)
No pain or no drainage. will send med as directed

## 2015-05-22 ENCOUNTER — Ambulatory Visit (INDEPENDENT_AMBULATORY_CARE_PROVIDER_SITE_OTHER): Payer: Medicare HMO | Admitting: Family Medicine

## 2015-05-22 ENCOUNTER — Encounter: Payer: Self-pay | Admitting: Family Medicine

## 2015-05-22 VITALS — BP 116/70 | HR 80 | Resp 18 | Ht 59.75 in | Wt 175.1 lb

## 2015-05-22 DIAGNOSIS — I10 Essential (primary) hypertension: Secondary | ICD-10-CM

## 2015-05-22 DIAGNOSIS — R490 Dysphonia: Secondary | ICD-10-CM

## 2015-05-22 DIAGNOSIS — J309 Allergic rhinitis, unspecified: Secondary | ICD-10-CM | POA: Insufficient documentation

## 2015-05-22 DIAGNOSIS — J3089 Other allergic rhinitis: Secondary | ICD-10-CM | POA: Diagnosis not present

## 2015-05-22 MED ORDER — LEVOTHYROXINE SODIUM 25 MCG PO TABS: 25.0000 ug | ORAL_TABLET | Freq: Every morning | ORAL | Status: DC

## 2015-05-22 MED ORDER — FLUTICASONE PROPIONATE 50 MCG/ACT NA SUSP: 1.0000 | Freq: Every day | NASAL | Status: DC

## 2015-05-22 MED ORDER — ATORVASTATIN CALCIUM 20 MG PO TABS: 20.0000 mg | ORAL_TABLET | Freq: Every day | ORAL | Status: DC

## 2015-05-22 NOTE — Progress Notes (Signed)
   Subjective:    Patient ID: [name], female    DOB: 08-Sep-1933, 81 y.o.   MRN: [id number]  HPI 2 month h/o excessive nasal congestion and pressure esp left nostril, also, painless hoareseness x 2 month. No fever or chills, had episode of red left eye, several weeks ago, spontaneously resolved, thinks it was from excessive blowing No cough Has used oTC medication for allergies with good response Review of Systems See HPI . Denies chest pains, palpitations and leg swelling Denies abdominal pain, nausea, vomiting,diarrhea or constipation.   Denies dysuria, frequency, hesitancy or incontinence. Denies joint pain, swelling and limitation in mobility. Denies headaches, seizures, numbness, or tingling. Denies depression, anxiety or insomnia. Denies skin break down or rash.         Objective:   Physical Exam BP 116/70 mmHg  Pulse 80  Resp 18  Ht 4' 11.75" (1.518 m)  Wt 175 lb 1.3 oz (79.416 kg)  BMI 34.46 kg/m2  SpO2 97% Patient alert and oriented and in no cardiopulmonary distress.  HEENT: No facial asymmetry, EOMI,   oropharynx pink and moist.  Neck supple no JVD, no mass. No sinus tenderness. TM clear. Prominent left nasal turbinate Chest: Clear to auscultation bilaterally.  CVS: S1, S2 no murmurs, no S3.Regular rate.  ABD: Soft non tender.   Ext: No edema  MS: Adequate ROM spine, shoulders, hips and knees.  Skin: Intact, no ulcerations or rash noted.  Psych: Good eye contact, normal affect. Memory intact not anxious or depressed appearing.  CNS: CN 2-12 intact, power,  normal throughout.no focal deficits noted.        Assessment & Plan:  Allergic rhinitis Start daily flonase, refer to ENT, hypertrophic left nasal turbinate  Essential hypertension Controlled, no change in medication   Hoarseness, chronic 2 month h/o chronic painless hoarseness, ENT to eval and treat, soonest available appt

## 2015-05-22 NOTE — Assessment & Plan Note (Signed)
2 month h/o chronic painless hoarseness, ENT to eval and treat, soonest available appt

## 2015-05-22 NOTE — Assessment & Plan Note (Signed)
Start daily flonase, refer to ENT, hypertrophic left nasal turbinate

## 2015-05-22 NOTE — Assessment & Plan Note (Signed)
Controlled, no change in medication  

## 2015-05-22 NOTE — Patient Instructions (Signed)
F/u in November as before  New medication flonase sent for nasal congestion and allergies , please use as directed  You are referred top Dr Benjamine Mola for evaluation of hoarseness and nasal congestion, left nostril  More than right  Thanks for choosing [facility], we consider it a privelige to serve you.

## 2015-06-11 ENCOUNTER — Emergency Department (HOSPITAL_COMMUNITY)
Admission: EM | Admit: 2015-06-11 | Discharge: 2015-06-12 | Disposition: A | Discharge status: Home / Self Care | Payer: Medicare HMO | Attending: Emergency Medicine | Admitting: Emergency Medicine

## 2015-06-11 ENCOUNTER — Emergency Department (HOSPITAL_COMMUNITY): Payer: Medicare HMO

## 2015-06-11 ENCOUNTER — Encounter (HOSPITAL_COMMUNITY): Payer: Self-pay | Admitting: Emergency Medicine

## 2015-06-11 DIAGNOSIS — R079 Chest pain, unspecified: Secondary | ICD-10-CM | POA: Insufficient documentation

## 2015-06-11 DIAGNOSIS — R0789 Other chest pain: Secondary | ICD-10-CM | POA: Diagnosis not present

## 2015-06-11 DIAGNOSIS — E079 Disorder of thyroid, unspecified: Secondary | ICD-10-CM | POA: Diagnosis not present

## 2015-06-11 DIAGNOSIS — G8929 Other chronic pain: Secondary | ICD-10-CM | POA: Diagnosis not present

## 2015-06-11 DIAGNOSIS — Z791 Long term (current) use of non-steroidal anti-inflammatories (NSAID): Secondary | ICD-10-CM | POA: Insufficient documentation

## 2015-06-11 DIAGNOSIS — E669 Obesity, unspecified: Secondary | ICD-10-CM | POA: Insufficient documentation

## 2015-06-11 DIAGNOSIS — Z87891 Personal history of nicotine dependence: Secondary | ICD-10-CM | POA: Insufficient documentation

## 2015-06-11 DIAGNOSIS — Z7982 Long term (current) use of aspirin: Secondary | ICD-10-CM | POA: Diagnosis not present

## 2015-06-11 DIAGNOSIS — Z7951 Long term (current) use of inhaled steroids: Secondary | ICD-10-CM | POA: Insufficient documentation

## 2015-06-11 DIAGNOSIS — E785 Hyperlipidemia, unspecified: Secondary | ICD-10-CM | POA: Insufficient documentation

## 2015-06-11 DIAGNOSIS — I10 Essential (primary) hypertension: Secondary | ICD-10-CM | POA: Insufficient documentation

## 2015-06-11 DIAGNOSIS — D649 Anemia, unspecified: Secondary | ICD-10-CM | POA: Diagnosis not present

## 2015-06-11 DIAGNOSIS — J9811 Atelectasis: Secondary | ICD-10-CM | POA: Diagnosis not present

## 2015-06-11 DIAGNOSIS — I2510 Atherosclerotic heart disease of native coronary artery without angina pectoris: Secondary | ICD-10-CM | POA: Insufficient documentation

## 2015-06-11 DIAGNOSIS — K219 Gastro-esophageal reflux disease without esophagitis: Secondary | ICD-10-CM | POA: Insufficient documentation

## 2015-06-11 DIAGNOSIS — M25561 Pain in right knee: Secondary | ICD-10-CM | POA: Diagnosis not present

## 2015-06-11 DIAGNOSIS — R0602 Shortness of breath: Secondary | ICD-10-CM | POA: Diagnosis not present

## 2015-06-11 DIAGNOSIS — Z79899 Other long term (current) drug therapy: Secondary | ICD-10-CM | POA: Insufficient documentation

## 2015-06-11 DIAGNOSIS — Z8701 Personal history of pneumonia (recurrent): Secondary | ICD-10-CM | POA: Diagnosis not present

## 2015-06-11 LAB — BASIC METABOLIC PANEL
Anion gap: 10 (ref 5–15)
BUN: 19 mg/dL (ref 6–20)
CO2: 23 mmol/L (ref 22–32)
Calcium: 8.9 mg/dL (ref 8.9–10.3)
Chloride: 104 mmol/L (ref 101–111)
Creatinine, Ser: 1.42 mg/dL — ABNORMAL HIGH (ref 0.44–1.00)
GFR calc Af Amer: 40 mL/min — ABNORMAL LOW (ref >60)
GFR calc non Af Amer: 34 mL/min — ABNORMAL LOW (ref >60)
Glucose, Bld: 92 mg/dL (ref 65–99)
Potassium: 3.6 mmol/L (ref 3.5–5.1)
Sodium: 137 mmol/L (ref 135–145)

## 2015-06-11 LAB — CBC WITH DIFFERENTIAL/PLATELET
Basophils Absolute: 0 10*3/uL (ref 0.0–0.1)
Basophils Relative: 0 %
Eosinophils Absolute: 0.1 10*3/uL (ref 0.0–0.7)
Eosinophils Relative: 2 %
HCT: 37 % (ref 36.0–46.0)
Hemoglobin: 12.3 g/dL (ref 12.0–15.0)
Lymphocytes Relative: 29 %
Lymphs Abs: 2.1 10*3/uL (ref 0.7–4.0)
MCH: 28.9 pg (ref 26.0–34.0)
MCHC: 33.2 g/dL (ref 30.0–36.0)
MCV: 86.9 fL (ref 78.0–100.0)
Monocytes Absolute: 0.6 10*3/uL (ref 0.1–1.0)
Monocytes Relative: 8 %
Neutro Abs: 4.4 10*3/uL (ref 1.7–7.7)
Neutrophils Relative %: 61 %
Platelets: 241 10*3/uL (ref 150–400)
RBC: 4.26 MIL/uL (ref 3.87–5.11)
RDW: 14.2 % (ref 11.5–15.5)
WBC: 7.2 10*3/uL (ref 4.0–10.5)

## 2015-06-11 LAB — TROPONIN I
Troponin I: <0.03 ng/mL (ref <0.031)
Troponin I: <0.03 ng/mL (ref <0.031)

## 2015-06-11 MED ORDER — ACETAMINOPHEN 325 MG PO TABS
ORAL_TABLET | ORAL | Status: AC
  Administered 2015-06-11: 650 mg via ORAL
  Filled 2015-06-11: qty 2

## 2015-06-11 MED ORDER — ACETAMINOPHEN 325 MG PO TABS
650.0000 mg | ORAL_TABLET | Freq: Once | ORAL | Status: AC
  Administered 2015-06-11: 650 mg via ORAL

## 2015-06-11 MED ORDER — NITROGLYCERIN 0.4 MG SL SUBL
0.4000 mg | SUBLINGUAL_TABLET | Freq: Once | SUBLINGUAL | Status: AC
  Administered 2015-06-11: 0.4 mg via SUBLINGUAL
  Filled 2015-06-11 (×2): qty 1

## 2015-06-11 NOTE — Discharge Instructions (Signed)
Heart tests were normal 2 different occasions. X-ray revealed right knee shows severe arthritis. Ice, elevate, follow up with orthopedics. Phone number given.

## 2015-06-11 NOTE — ED Provider Notes (Signed)
CSN: [id number]     Arrival date & time 06/11/15  1915 History   First MD Initiated Contact with Patient 06/11/15 1933     Chief Complaint  Patient presents with  . Chest Pain     (Consider location/radiation/quality/duration/timing/severity/associated sxs/prior Treatment) HPI.... Left anterior chest pain approximately 6:30 PM tonight describes as a tightness without radiation. Minimal shortness of breath. No sweating or nausea. Patient used nitroglycerin spray which helped some. She claims to have a history of a "light heart attack" in the past. Review of systems positive for right knee pain.  Past Medical History  Diagnosis Date  . Hyperlipidemia     takes Lipitor nightly  . Obesity   . DJD (degenerative joint disease)     Severe  . CAD (coronary artery disease)   . Chronic back pain   . Carpal tunnel syndrome on left     Left Hand pain with numbness, new onset   . Difficult intubation     small airway  . Hypertension     takes Amlodipine and Metoprolol daily  . Blood clot associated with vein wall inflammation     hx of in right leg  . Sleep apnea     sleep study done 60yrs ago  . Shortness of breath     with exertion  . Pneumonia     hx of-in 2011  . Headache(784.0)     occasionally  . Chronic back pain     buldging disc  . GERD (gastroesophageal reflux disease)     takes Omeprazole daily  . Gastric ulcer     hx of   . Constipation   . Diarrhea   . Nocturia   . Anemia   . Thyroid mass     right   Past Surgical History  Procedure Laterality Date  . Appendectomy  1987  . Carpal tunnel release right      x 2  . Partial hysterectomy  1972  . Arthroscopy left shoulder  1999  . Arthoscopy left knee  1992  . Back  surgery  lumbar  1989 /1999    x 2  . Tonsillectomy    . Rotator cuff repair      right  . Colonoscopy    . Esophagogastroduodenoscopy    . Cholecystectomy    . Thyroidectomy  11/17/2011    Procedure: THYROIDECTOMY;  Surgeon: Ascencion Dike, MD;   Location: [facility];  Service: ENT;  Laterality: Right;  WITH FROZEN SECTION  . Cardiac catheterization  05/2010  . Abdominal hysterectomy    . Back surgery    . Nm myocar perf wall motion  11/12/2008    no ischemia  . Spine surgery    . Lumbar fusion  March 19, 2014   Family History  Problem Relation Age of Onset  . Anuerysm Mother     died at 48  . Anesthesia problems Neg Hx   . Hypotension Neg Hx   . Malignant hyperthermia Neg Hx   . Pseudochol deficiency Neg Hx   . Stroke Father     15 at death    Social History  Substance Use Topics  . Smoking status: Former Smoker    Types: Cigarettes    Quit date: 09/06/1984  . Smokeless tobacco: None  . Alcohol Use: No     Comment: Casual   OB History    No data available     Review of Systems  All other systems reviewed and are negative.  Allergies  Ace inhibitors; Bupropion hcl; Codeine; Penicillins; and Angiotensin receptor blockers  Home Medications   Prior to Admission medications   Medication Sig Start Date End Date Taking? Authorizing Provider  amLODipine (NORVASC) 10 MG tablet take 1 tablet by mouth once daily 12/30/14  Yes Fayrene Helper, MD  Ascorbic Acid (VITAMIN C) 500 MG tablet Take 500 mg by mouth daily.    Yes Historical Provider, MD  aspirin EC 81 MG tablet Take 81 mg by mouth daily.   Yes Historical Provider, MD  atorvastatin (LIPITOR) 20 MG tablet Take 1 tablet (20 mg total) by mouth at bedtime. 05/22/15  Yes Fayrene Helper, MD  calcium carbonate (OS-CAL - DOSED IN MG OF ELEMENTAL CALCIUM) 1250 MG tablet Take 1 tablet by mouth daily.   Yes Historical Provider, MD  Cyanocobalamin (B-12) 1000 MCG CAPS Take 1 capsule by mouth daily.   Yes Historical Provider, MD  diclofenac sodium (VOLTAREN) 1 % GEL Apply 1 application topically 4 (four) times daily. 02/28/12  Yes Fayrene Helper, MD  ferrous sulfate 325 (65 FE) MG tablet Take 325 mg by mouth daily with breakfast.    Yes Historical Provider, MD   FLUoxetine (PROZAC) 20 MG tablet take 1 and 1/2 tablet by mouth once daily Patient taking differently: Take 30 mg by mouth daily.  03/17/15  Yes Fayrene Helper, MD  fluticasone (FLONASE) 50 MCG/ACT nasal spray Place 1 spray into both nostrils daily. 05/22/15  Yes Fayrene Helper, MD  hydrochlorothiazide (MICROZIDE) 12.5 MG capsule take 1 capsule by mouth once daily 10/21/14  Yes Fayrene Helper, MD  indomethacin (INDOCIN) 25 MG capsule Take 25 mg by mouth daily.  05/09/15  Yes Historical Provider, MD  isosorbide mononitrate (IMDUR) 30 MG 24 hr tablet Take 1 tablet (30 mg total) by mouth daily. 03/07/15  Yes Mihai Croitoru, MD  levothyroxine (SYNTHROID, LEVOTHROID) 25 MCG tablet Take 1 tablet (25 mcg total) by mouth every morning. 05/22/15  Yes Fayrene Helper, MD  Multiple Vitamin (MULITIVITAMIN WITH MINERALS) TABS Take 1 tablet by mouth daily.   Yes Historical Provider, MD  nitroGLYCERIN (NITROLINGUAL) 0.4 MG/SPRAY spray Place 1 spray under the tongue every 5 (five) minutes x 3 doses as needed for chest pain. 08/14/14  Yes Mihai Croitoru, MD  olopatadine (PATANOL) 0.1 % ophthalmic solution Place 1 drop into the left eye 3 (three) times daily. 05/01/15  Yes Fayrene Helper, MD  omeprazole (PRILOSEC) 40 MG capsule take 1 capsule by mouth once daily 02/13/15  Yes Fayrene Helper, MD  potassium chloride (K-DUR,KLOR-CON) 10 MEQ tablet take 1 tablet by mouth twice a day 02/19/15  Yes Fayrene Helper, MD  pyridoxine (B-6) 200 MG tablet Take 200 mg by mouth 2 (two) times daily.    Yes Historical Provider, MD  EPIPEN 2-PAK 0.3 MG/0.3ML SOAJ injection INJECT AS DIRECTED FOR SEVERE ALLERGIC REACTIONS 08/26/14   Fayrene Helper, MD  ZOVIRAX 5 % APPLY TOPICALLY EVERY 3 HOURS. 01/24/13   Fayrene Helper, MD   BP 139/85 mmHg  Pulse 89  Temp(Src) 97.7 F (36.5 C) (Oral)  Resp 10  Ht 5\' 6"  (1.676 m)  Wt 174 lb (78.926 kg)  BMI 28.10 kg/m2  SpO2 93% Physical Exam  Constitutional: She is  oriented to person, place, and time. She appears well-developed and well-nourished.  HENT:  Head: Normocephalic and atraumatic.  Eyes: Conjunctivae and EOM are normal. Pupils are equal, round, and reactive to light.  Neck: Normal range  of motion. Neck supple.  Cardiovascular: Normal rate and regular rhythm.   Pulmonary/Chest: Effort normal and breath sounds normal.  Abdominal: Soft. Bowel sounds are normal.  Musculoskeletal: Normal range of motion.  Neurological: She is alert and oriented to person, place, and time.  Skin: Skin is warm and dry.  Psychiatric: She has a normal mood and affect. Her behavior is normal.  Nursing note and vitals reviewed.   ED Course  Procedures (including critical care time) Labs Review Labs Reviewed  BASIC METABOLIC PANEL - Abnormal; Notable for the following:    Creatinine, Ser 1.42 (*)    GFR calc non Af Amer 34 (*)    GFR calc Af Amer 40 (*)    All other components within normal limits  CBC WITH DIFFERENTIAL/PLATELET  TROPONIN I  TROPONIN I    Imaging Review Dg Chest Port 1 View  06/11/2015   CLINICAL DATA:  81 year old female with sharp central chest pain. Patient had a fall yesterday and has a right leg E injury.  EXAM: PORTABLE CHEST 1 VIEW  COMPARISON:  Prior chest x-ray 08/24/2012  FINDINGS: Stable cardiac and mediastinal contours. Trace atherosclerotic calcification in the transverse aorta. Slightly low inspiratory volumes with minimal bibasilar atelectasis. No pneumothorax, pleural effusion or other acute abnormality. No acute fracture.  IMPRESSION: Slightly low inspiratory volumes with mild bibasilar atelectasis.  Otherwise, no acute cardiopulmonary process.   Electronically Signed   By: Jacqulynn Cadet M.D.   On: 06/11/2015 19:51   Dg Knee Complete 4 Views Right  06/11/2015   CLINICAL DATA:  Medial sided right knee pain for the past 2 days.  EXAM: RIGHT KNEE - COMPLETE 4+ VIEW  COMPARISON:  None.  FINDINGS: No fracture or dislocation.  Moderate to severe degenerative change of the knee, worse within the lateral compartment with near complete joint space loss, subchondral sclerosis, articular surface irregularity and osteophytosis. No evidence of chondrocalcinosis. Seen on the lateral radiograph is a punctate (approximately 0.9 cm) peripherally corticated presumed loose body though a discrete donor site is not identified. No joint effusion or evidence of lipohemarthrosis. Adjacent vascular calcifications. No radiopaque foreign body.  IMPRESSION: 1. No acute findings. 2. Moderate severe tricompartmental degenerative change of the knee, worse within the lateral compartment.   Electronically Signed   By: Sandi Mariscal M.D.   On: 06/11/2015 21:13   I have personally reviewed and evaluated these images and lab results as part of my medical decision-making.   EKG Interpretation   Date/Time:  Wednesday June 11 2015 19:20:09 EDT Ventricular Rate:  87 PR Interval:  166 QRS Duration: 76 QT Interval:  374 QTC Calculation: 450 R Axis:   32 Text Interpretation:  Normal sinus rhythm Normal ECG Confirmed by Wai Minotti   MD, Alfonse Garringer ([id number]) on 06/11/2015 7:34:03 PM      MDM   Final diagnoses:  Chest pain, unspecified chest pain type  Right knee pain    EKG and first troponin negative. Discussed test results with patient and Dr. Alvino Chapel.  Can most likely discharge if second troponin negative    Nat Christen, MD 06/11/15 2203

## 2015-06-11 NOTE — ED Notes (Signed)
Pt fell yesterday. Right leg pain and swelling, pt was coming to get xray and had sudden on set of chest pain, dizzy, sob

## 2015-06-12 ENCOUNTER — Other Ambulatory Visit: Payer: Self-pay

## 2015-06-12 MED ORDER — LEVOTHYROXINE SODIUM 25 MCG PO TABS: 25.0000 ug | ORAL_TABLET | Freq: Every morning | ORAL | Status: DC

## 2015-06-17 ENCOUNTER — Other Ambulatory Visit: Payer: Self-pay

## 2015-06-23 ENCOUNTER — Other Ambulatory Visit: Payer: Self-pay | Admitting: Family Medicine

## 2015-06-26 ENCOUNTER — Telehealth: Payer: Self-pay

## 2015-06-26 ENCOUNTER — Other Ambulatory Visit: Payer: Self-pay

## 2015-06-26 ENCOUNTER — Ambulatory Visit: Payer: Medicare HMO

## 2015-06-26 VITALS — BP 134/86

## 2015-06-26 DIAGNOSIS — M25561 Pain in right knee: Secondary | ICD-10-CM

## 2015-06-26 MED ORDER — METHYLPREDNISOLONE ACETATE 80 MG/ML IJ SUSP
80.0000 mg | Freq: Once | INTRAMUSCULAR | Status: AC
  Administered 2015-06-26: 80 mg via INTRAMUSCULAR

## 2015-06-26 MED ORDER — KETOROLAC TROMETHAMINE 60 MG/2ML IM SOLN
60.0000 mg | Freq: Once | INTRAMUSCULAR | Status: AC
Start: 2015-06-26 — End: 2015-06-26
  Administered 2015-06-26: 60 mg via INTRAMUSCULAR

## 2015-06-26 MED ORDER — INDOMETHACIN 25 MG PO CAPS: 25.0000 mg | ORAL_CAPSULE | Freq: Every day | ORAL | Status: DC

## 2015-06-26 NOTE — Progress Notes (Signed)
Right knee pain and swelling from fall. Injections given per Dr with no complications

## 2015-06-26 NOTE — Telephone Encounter (Signed)
Refill x 1 pls, advise use only if she needs, tyl;enol safer for long term use. Offer toradol 60 mg Im sndndepo medrol 80 mg IM as nurse cvisit, if none in 3 month also pls

## 2015-06-26 NOTE — Telephone Encounter (Signed)
Patient aware and will come in for injections.  Refill sent to requested pharmacy.

## 2015-07-07 ENCOUNTER — Telehealth: Payer: Self-pay | Admitting: Family Medicine

## 2015-07-07 DIAGNOSIS — I10 Essential (primary) hypertension: Secondary | ICD-10-CM

## 2015-07-07 DIAGNOSIS — R7303 Prediabetes: Secondary | ICD-10-CM

## 2015-07-07 DIAGNOSIS — E785 Hyperlipidemia, unspecified: Secondary | ICD-10-CM

## 2015-07-07 NOTE — Telephone Encounter (Signed)
Ms. Debes is asking does she need lab work done before next appointment on 07/21/15, please advise?

## 2015-07-08 NOTE — Telephone Encounter (Signed)
Called patient and left message for them to return call at the office   

## 2015-07-10 NOTE — Telephone Encounter (Signed)
Lab orders sent to lab

## 2015-07-10 NOTE — Telephone Encounter (Signed)
Pt aware.

## 2015-07-11 DIAGNOSIS — I10 Essential (primary) hypertension: Secondary | ICD-10-CM | POA: Diagnosis not present

## 2015-07-11 DIAGNOSIS — R7303 Prediabetes: Secondary | ICD-10-CM | POA: Diagnosis not present

## 2015-07-11 DIAGNOSIS — R7309 Other abnormal glucose: Secondary | ICD-10-CM | POA: Diagnosis not present

## 2015-07-11 DIAGNOSIS — E785 Hyperlipidemia, unspecified: Secondary | ICD-10-CM | POA: Diagnosis not present

## 2015-07-12 LAB — LIPID PANEL
Cholesterol: 153 mg/dL (ref 125–200)
HDL: 58 mg/dL (ref >=46)
LDL Cholesterol: 79 mg/dL (ref <130)
Total CHOL/HDL Ratio: 2.6 Ratio (ref <=5.0)
Triglycerides: 79 mg/dL (ref <150)
VLDL: 16 mg/dL (ref <30)

## 2015-07-12 LAB — TSH: TSH: 3.846 u[IU]/mL (ref 0.350–4.500)

## 2015-07-12 LAB — HEMOGLOBIN A1C
Hgb A1c MFr Bld: 5.9 % — ABNORMAL HIGH (ref <5.7)
Mean Plasma Glucose: 123 mg/dL — ABNORMAL HIGH (ref <117)

## 2015-07-21 ENCOUNTER — Ambulatory Visit: Payer: Medicare HMO | Admitting: Family Medicine

## 2015-08-06 ENCOUNTER — Encounter: Payer: Self-pay | Admitting: Family Medicine

## 2015-08-06 ENCOUNTER — Ambulatory Visit (INDEPENDENT_AMBULATORY_CARE_PROVIDER_SITE_OTHER): Payer: Medicare HMO | Admitting: Family Medicine

## 2015-08-06 VITALS — BP 128/80 | HR 82 | Resp 18 | Ht 59.75 in | Wt 175.0 lb

## 2015-08-06 DIAGNOSIS — I10 Essential (primary) hypertension: Secondary | ICD-10-CM

## 2015-08-06 DIAGNOSIS — M25561 Pain in right knee: Secondary | ICD-10-CM | POA: Diagnosis not present

## 2015-08-06 DIAGNOSIS — R7303 Prediabetes: Secondary | ICD-10-CM | POA: Diagnosis not present

## 2015-08-06 DIAGNOSIS — E890 Postprocedural hypothyroidism: Secondary | ICD-10-CM | POA: Diagnosis not present

## 2015-08-06 DIAGNOSIS — K219 Gastro-esophageal reflux disease without esophagitis: Secondary | ICD-10-CM

## 2015-08-06 DIAGNOSIS — Z23 Encounter for immunization: Secondary | ICD-10-CM

## 2015-08-06 DIAGNOSIS — E785 Hyperlipidemia, unspecified: Secondary | ICD-10-CM

## 2015-08-06 MED ORDER — PREDNISONE 5 MG (21) PO TBPK: 5.0000 mg | ORAL_TABLET | ORAL | Status: DC

## 2015-08-06 MED ORDER — METHYLPREDNISOLONE ACETATE 80 MG/ML IJ SUSP
80.0000 mg | Freq: Once | INTRAMUSCULAR | Status: AC
  Administered 2015-08-06: 80 mg via INTRAMUSCULAR

## 2015-08-06 NOTE — Patient Instructions (Addendum)
Annual physical exam in 3.5 month, call if you need me sooner  You are referred to orthopedic Doc soonest available appt since right knee remains painful and is unstable  PLEASE BE CAREFUL NOT TO FALL  Pls reduce sweets and starchy foods , blood sugar has inceased slightly  Depo medrol in office today for right knee pain ,adn 6 day course of prednisone sent in also  Thanks for choosing [facility], we consider it a privelige to serve you.   All the best for 2017!

## 2015-08-06 NOTE — Progress Notes (Signed)
Subjective:    Patient ID: [name], female    DOB: Mar 11, 1934, 81 y.o.   MRN: [id number]  HPI   [name]     MRN: [id number]      DOB: 1934-06-30   HPI [name] is here for follow up and re-evaluation of chronic medical conditions, medication management and review of any available recent lab and radiology data.  Preventive health is updated, specifically  Cancer screening and Immunization.  Flu vac needed and administered at visit  The PT denies any adverse reactions to current medications since the last visit.  Had recent fall and c/o worsened pain, swelling and instability of right knee. Was evaluated by ortho and has been  On indomethacin, no improvement, now limping and unsteady   ROS Denies recent fever or chills. Denies sinus pressure, nasal congestion, ear pain or sore throat. Denies chest congestion, productive cough or wheezing. Denies chest pains, palpitations and leg swelling Denies abdominal pain, nausea, vomiting,diarrhea or constipation.   Denies dysuria, frequency, hesitancy or incontinence. . Denies headaches, seizures, numbness, or tingling. Denies depression, anxiety or insomnia.Increased stress with recent damage to her home Denies skin break down or rash.   PE  BP 128/80 mmHg  Pulse 82  Resp 18  Ht 4' 11.75" (1.518 m)  Wt 175 lb 0.6 oz (79.398 kg)  BMI 34.46 kg/m2  SpO2 98%  Patient alert and oriented and in no cardiopulmonary distress.Pt in pain  HEENT: No facial asymmetry, EOMI,   oropharynx pink and moist.  Neck supple no JVD, no mass.  Chest: Clear to auscultation bilaterally.  CVS: S1, S2 no murmurs, no S3.Regular rate.  ABD: Soft non tender.   Ext: No edema  MS: Adequate though reduced  ROM spine, normal in shoulders and  hips , swelling, pain , crepitus and reduced ROM in right knee, abnormal gait with limping  Skin: Intact, no ulcerations or rash noted.  Psych: Good eye contact, normal affect. Memory intact  mildly anxious, not depressed appearing  CNS: CN 2-12 intact, power,  normal throughout.no focal deficits noted.   Assessment & Plan  Knee pain, right Increased pain , swelling and instability, fell recently also, depo medrol in office followed by 6 day course of prednisone Reviewed safety issues, needs ortho asap. Recently on indomethacin for her symptoms , no improvement noted  Prediabetes Patient educated about the importance of limiting  Carbohydrate intake , the need to commit to daily physical activity for a minimum of 30 minutes , and to commit weight loss. The fact that changes in all these areas will reduce or eliminate all together the development of diabetes is stressed.  Improved  Diabetic Labs Latest Ref Rng 07/10/2015 06/11/2015 03/06/2015 06/18/2014 11/20/2013  HbA1c <5.7 % 5.9(H) - - 5.5 6.0(H)  Chol 125 - 200 mg/dL 153 - 163 131 -  HDL >=46 mg/dL 58 - 52 46 -  Calc LDL <130 mg/dL 79 - 80 60 -  Triglycerides <150 mg/dL 79 - 155(H) 125 -  Creatinine 0.44 - 1.00 mg/dL - 1.42(H) 0.98 0.93 1.18(H)   BP/Weight 08/06/2015 06/26/2015 06/11/2015 05/22/2015 03/17/2015 [id number]  Systolic BP 0000000 [id number] 123456 [id number] -  Diastolic BP 80 86 83 70 82 64 -  Wt. (Lbs) 175.04 - 174 175.08 174 169.2 168  BMI 34.46 - 28.1 34.46 34.25 30.94 31.76   No flowsheet data found.     Hypothyroid Controlled, no change in medication   GERD Controlled, no change in medication  Essential hypertension Controlled, no change in medication   Hyperlipidemia Hyperlipidemia:Low fat diet discussed and encouraged.   Lipid Panel  Lab Results  Component Value Date   CHOL 153 07/10/2015   HDL 58 07/10/2015   LDLCALC 79 07/10/2015   TRIG 79 07/10/2015   CHOLHDL 2.6 07/10/2015   Controlled, no change in medication          Review of Systems     Objective:   Physical Exam        Assessment & Plan:

## 2015-08-11 NOTE — Assessment & Plan Note (Signed)
Hyperlipidemia:Low fat diet discussed and encouraged.   Lipid Panel  Lab Results  Component Value Date   CHOL 153 07/10/2015   HDL 58 07/10/2015   LDLCALC 79 07/10/2015   TRIG 79 07/10/2015   CHOLHDL 2.6 07/10/2015   Controlled, no change in medication

## 2015-08-11 NOTE — Assessment & Plan Note (Signed)
Controlled, no change in medication  

## 2015-08-11 NOTE — Assessment & Plan Note (Signed)
Patient educated about the importance of limiting  Carbohydrate intake , the need to commit to daily physical activity for a minimum of 30 minutes , and to commit weight loss. The fact that changes in all these areas will reduce or eliminate all together the development of diabetes is stressed.  Improved  Diabetic Labs Latest Ref Rng 07/10/2015 06/11/2015 03/06/2015 06/18/2014 11/20/2013  HbA1c <5.7 % 5.9(H) - - 5.5 6.0(H)  Chol 125 - 200 mg/dL 153 - 163 131 -  HDL >=46 mg/dL 58 - 52 46 -  Calc LDL <130 mg/dL 79 - 80 60 -  Triglycerides <150 mg/dL 79 - 155(H) 125 -  Creatinine 0.44 - 1.00 mg/dL - 1.42(H) 0.98 0.93 1.18(H)   BP/Weight 08/06/2015 06/26/2015 06/11/2015 05/22/2015 03/17/2015 [id number]  Systolic BP 0000000 [id number] 123456 [id number] [id number] [id number] -  Diastolic BP 80 86 83 70 82 64 -  Wt. (Lbs) 175.04 - 174 175.08 174 169.2 168  BMI 34.46 - 28.1 34.46 34.25 30.94 31.76   No flowsheet data found.

## 2015-08-11 NOTE — Assessment & Plan Note (Signed)
Increased pain , swelling and instability, fell recently also, depo medrol in office followed by 6 day course of prednisone Reviewed safety issues, needs ortho asap. Recently on indomethacin for her symptoms , no improvement noted

## 2015-08-15 DIAGNOSIS — M25561 Pain in right knee: Secondary | ICD-10-CM | POA: Diagnosis not present

## 2015-08-15 DIAGNOSIS — M1711 Unilateral primary osteoarthritis, right knee: Secondary | ICD-10-CM | POA: Diagnosis not present

## 2015-08-18 ENCOUNTER — Telehealth: Payer: Self-pay | Admitting: Family Medicine

## 2015-08-18 MED ORDER — HYDROCODONE-ACETAMINOPHEN 7.5-325 MG PO TABS: 1.0000 | ORAL_TABLET | Freq: Two times a day (BID) | ORAL | Status: DC | PRN

## 2015-08-18 NOTE — Telephone Encounter (Signed)
Please advise 

## 2015-08-18 NOTE — Telephone Encounter (Signed)
Patient aware and states that ortho told her only option left is to have knee replacement.  She will think about this more.  Will collect script on 12/13

## 2015-08-18 NOTE — Addendum Note (Signed)
Addended by: Denman George B on: 08/18/2015 04:41 PM   Modules accepted: Orders

## 2015-08-18 NOTE — Telephone Encounter (Signed)
pls prescribe norco 7.5/325 , one twice daily as needed for 30 tabs, BUT she needs to contact the ortho Doc and make them aware of the new and increased pain symptonms also

## 2015-08-18 NOTE — Telephone Encounter (Signed)
[name] saw Dr. Gladstone Lighter on Friday Dec 9th and received an injection in her knee he stated it was Arthritis and now pain has moved up into her Right Hip, she is asking for a Prescription for Hydrocodone for pain, please advise?

## 2015-08-26 ENCOUNTER — Other Ambulatory Visit: Payer: Self-pay | Admitting: Family Medicine

## 2015-09-16 ENCOUNTER — Ambulatory Visit: Payer: Medicare HMO | Admitting: Family Medicine

## 2015-09-19 ENCOUNTER — Other Ambulatory Visit: Payer: Self-pay

## 2015-09-19 ENCOUNTER — Telehealth: Payer: Self-pay | Admitting: Family Medicine

## 2015-09-19 MED ORDER — LEVOTHYROXINE SODIUM 25 MCG PO TABS: 25.0000 ug | ORAL_TABLET | Freq: Every morning | ORAL | Status: DC

## 2015-09-19 NOTE — Telephone Encounter (Signed)
Medication refilled

## 2015-09-19 NOTE — Telephone Encounter (Signed)
Patient is requesting refills on her levothyroxine (SYNTHROID, LEVOTHROID) 25 MCG tablet please advise?

## 2015-09-24 ENCOUNTER — Other Ambulatory Visit: Payer: Self-pay | Admitting: Family Medicine

## 2015-09-24 DIAGNOSIS — Z1231 Encounter for screening mammogram for malignant neoplasm of breast: Secondary | ICD-10-CM

## 2015-09-25 ENCOUNTER — Ambulatory Visit (INDEPENDENT_AMBULATORY_CARE_PROVIDER_SITE_OTHER): Payer: Medicare HMO | Admitting: Cardiovascular Disease

## 2015-09-25 ENCOUNTER — Encounter: Payer: Self-pay | Admitting: Cardiovascular Disease

## 2015-09-25 VITALS — BP 102/64 | HR 78 | Ht 62.0 in | Wt 171.5 lb

## 2015-09-25 DIAGNOSIS — D689 Coagulation defect, unspecified: Secondary | ICD-10-CM

## 2015-09-25 DIAGNOSIS — I10 Essential (primary) hypertension: Secondary | ICD-10-CM

## 2015-09-25 DIAGNOSIS — I200 Unstable angina: Secondary | ICD-10-CM | POA: Diagnosis not present

## 2015-09-25 DIAGNOSIS — I25118 Atherosclerotic heart disease of native coronary artery with other forms of angina pectoris: Secondary | ICD-10-CM

## 2015-09-25 DIAGNOSIS — Z79899 Other long term (current) drug therapy: Secondary | ICD-10-CM

## 2015-09-25 DIAGNOSIS — I201 Angina pectoris with documented spasm: Secondary | ICD-10-CM

## 2015-09-25 NOTE — Patient Instructions (Signed)
Your physician has requested that you have a cardiac catheterization with one of the interventional cardiologists. Cardiac catheterization is used to diagnose and/or treat various heart conditions. Doctors may recommend this procedure for a number of different reasons. The most common reason is to evaluate chest pain. Chest pain can be a symptom of coronary artery disease (CAD), and cardiac catheterization can show whether plaque is narrowing or blocking your heart's arteries. This procedure is also used to evaluate the valves, as well as measure the blood flow and oxygen levels in different parts of your heart. For further information please visit [email or web address]. Please follow instruction sheet, as given.3-  Your physician recommends that you return for lab work in: 3-7 days prior to the cardiac cath.

## 2015-09-25 NOTE — Progress Notes (Signed)
Patient ID: Genna Gareau, female   DOB: 1933-11-09, 82 y.o.   MRN: [id number]     Cardiology Office Note    Date:  09/25/2015   ID:  Evelynne Godar, DOB 02-Apr-1934, MRN [id number]  PCP:  Tula Nakayama, MD  Cardiologist:   Sanda Klein, MD   Chief Complaint  Patient presents with  . Annual Exam    patient reports chest pain/discomfort becoming more frequently (almost daily)- these episodes occur when laying down at night, and sometimes with exertion    History of Present Illness:  Briani Mariconda is a 82 y.o. female with known moderate coronary stenosis by previous coronary angiography and a suspected diagnosis of coronary vasospasm. Over the last year she has had a pattern of gradually worsening exercise intolerance, more suggestive of accelerating stable angina pectoris. She develops chest tightness usually with activity, less commonly at rest. She has to stop twice to get to her mailbox due to both tightness in her chest and shortness of breath. The symptoms have worsened on at a very steady slow pace. She has had a few episodes of chest discomfort at rest. She reports improvement with sublingual nitroglycerin. She is already on antianginals (maximum dose amlodipine, long-acting nitrates). Her blood pressure is fairly low, not leaving much room for additional antianginal therapy.   She also has a history of gastroesophageal reflux disease and it is sometimes difficult to distinguish the origin of her symptoms. Some episodes of happened while she is reclining, but most episodes of chest discomfort have occurred while sitting up. She is compliant with omeprazole therapy.  She had a low risk nuclear stress test roughly one year ago. In 2011 she had a non-ST elevation MI type 2 due to demand ischemia. Cardiac cath was done at that time with minimal coronary atherosclerosis (30-40% lesions were seen scattered throughout the coronary tree especially prominent in the mid right coronary  and distal left circumflex coronary artery) resulting in presupmtive diagnosis of vasospastic angina/infarction was made. Has been treated for coronary disease with ASA, statin since that time.   Past Medical History  Diagnosis Date  . Hyperlipidemia     takes Lipitor nightly  . Obesity   . DJD (degenerative joint disease)     Severe  . CAD (coronary artery disease)   . Chronic back pain   . Carpal tunnel syndrome on left     Left Hand pain with numbness, new onset   . Difficult intubation     small airway  . Hypertension     takes Amlodipine and Metoprolol daily  . Blood clot associated with vein wall inflammation     hx of in right leg  . Sleep apnea     sleep study done 73yrs ago  . Shortness of breath     with exertion  . Pneumonia     hx of-in 2011  . Headache(784.0)     occasionally  . Chronic back pain     buldging disc  . GERD (gastroesophageal reflux disease)     takes Omeprazole daily  . Gastric ulcer     hx of   . Constipation   . Diarrhea   . Nocturia   . Anemia   . Thyroid mass     right    Past Surgical History  Procedure Laterality Date  . Appendectomy  1987  . Carpal tunnel release right      x 2  . Partial hysterectomy  1972  . Arthroscopy left shoulder  1999  .  Arthoscopy left knee  1992  . Back  surgery  lumbar  1989 /1999    x 2  . Tonsillectomy    . Rotator cuff repair      right  . Colonoscopy    . Esophagogastroduodenoscopy    . Cholecystectomy    . Thyroidectomy  11/17/2011    Procedure: THYROIDECTOMY;  Surgeon: Ascencion Dike, MD;  Location: [facility];  Service: ENT;  Laterality: Right;  WITH FROZEN SECTION  . Cardiac catheterization  05/2010  . Abdominal hysterectomy    . Back surgery    . Nm myocar perf wall motion  11/12/2008    no ischemia  . Spine surgery    . Lumbar fusion  March 19, 2014    Outpatient Prescriptions Prior to Visit  Medication Sig Dispense Refill  . amLODipine (NORVASC) 10 MG tablet take 1 tablet by mouth once  daily 30 tablet 5  . Ascorbic Acid (VITAMIN C) 500 MG tablet Take 500 mg by mouth daily.     Marland Kitchen aspirin EC 81 MG tablet Take 81 mg by mouth daily.    Marland Kitchen atorvastatin (LIPITOR) 20 MG tablet Take 1 tablet (20 mg total) by mouth at bedtime. 90 tablet 0  . calcium carbonate (OS-CAL - DOSED IN MG OF ELEMENTAL CALCIUM) 1250 MG tablet Take 1 tablet by mouth daily.    . Cyanocobalamin (B-12) 1000 MCG CAPS Take 1 capsule by mouth daily.    . diclofenac sodium (VOLTAREN) 1 % GEL Apply 1 application topically 4 (four) times daily. (Patient taking differently: Apply 2 g topically 2 (two) times daily as needed (for back pain). ) 100 g 4  . EPIPEN 2-PAK 0.3 MG/0.3ML SOAJ injection INJECT AS DIRECTED FOR SEVERE ALLERGIC REACTIONS 2 Device 1  . ferrous sulfate 325 (65 FE) MG tablet Take 325 mg by mouth daily with breakfast.     . FLUoxetine (PROZAC) 20 MG tablet take 1 and 1/2 tablet by mouth once daily (Patient taking differently: Take 30 mg by mouth daily. ) 45 tablet 5  . fluticasone (FLONASE) 50 MCG/ACT nasal spray Place 1 spray into both nostrils daily. 16 g 2  . hydrochlorothiazide (MICROZIDE) 12.5 MG capsule take 1 capsule by mouth once daily 30 capsule 5  . HYDROcodone-acetaminophen (NORCO) 7.5-325 MG tablet Take 1 tablet by mouth 2 (two) times daily as needed for moderate pain. (Patient taking differently: Take 1 tablet by mouth daily as needed for moderate pain. ) 30 tablet 0  . indomethacin (INDOCIN) 25 MG capsule Take 1 capsule (25 mg total) by mouth daily. 30 capsule 0  . isosorbide mononitrate (IMDUR) 30 MG 24 hr tablet Take 1 tablet (30 mg total) by mouth daily. 30 tablet 5  . levothyroxine (SYNTHROID, LEVOTHROID) 25 MCG tablet Take 1 tablet (25 mcg total) by mouth every morning. 90 tablet 0  . Multiple Vitamin (MULITIVITAMIN WITH MINERALS) TABS Take 1 tablet by mouth daily.    . nitroGLYCERIN (NITROLINGUAL) 0.4 MG/SPRAY spray Place 1 spray under the tongue every 5 (five) minutes x 3 doses as needed  for chest pain. 12 g 1  . omeprazole (PRILOSEC) 40 MG capsule take 1 capsule by mouth once daily 30 capsule 5  . potassium chloride (K-DUR) 10 MEQ tablet take 1 tablet by mouth twice a day 60 tablet 5  . potassium chloride (K-DUR,KLOR-CON) 10 MEQ tablet take 1 tablet by mouth twice a day (Patient not taking: Reported on 09/25/2015) 60 tablet 5  . predniSONE (STERAPRED UNI-PAK 21  TAB) 5 MG (21) TBPK tablet Take 1 tablet (5 mg total) by mouth as directed. Use as directed (Patient not taking: Reported on 09/25/2015) 21 tablet 0  . pyridoxine (B-6) 200 MG tablet Take 200 mg by mouth daily.     Marland Kitchen ZOVIRAX 5 % APPLY TOPICALLY EVERY 3 HOURS. 30 g 1   No facility-administered medications prior to visit.     Allergies:   Ace inhibitors; Bupropion hcl; Codeine; Penicillins; Shellfish allergy; and Angiotensin receptor blockers   Social History   Social History  . Marital Status: Widowed    Spouse Name: N/A  . Number of Children: 4  . Years of Education: N/A   Occupational History  .     Social History Main Topics  . Smoking status: Former Smoker    Types: Cigarettes    Quit date: 09/06/1984  . Smokeless tobacco: None  . Alcohol Use: No     Comment: Casual  . Drug Use: No  . Sexual Activity: No   Other Topics Concern  . None   Social History Narrative     Family History:  The patient's family history includes Anuerysm in her mother; Stroke in her father. There is no history of Anesthesia problems, Hypotension, Malignant hyperthermia, or Pseudochol deficiency.   ROS:   Please see the history of present illness.    ROS All other systems reviewed and are negative.   PHYSICAL EXAM:   VS:  BP 102/64 mmHg  Pulse 78  Ht 5\' 2"  (1.575 m)  Wt 171 lb 8 oz (77.792 kg)  BMI 31.36 kg/m2   GEN: Well nourished, well developed, in no acute distress HEENT: normal Neck: no JVD, carotid bruits, or masses Cardiac: RRR; no murmurs, rubs, or gallops,no edema  Respiratory:  clear to auscultation  bilaterally, normal work of breathing GI: soft, nontender, nondistended, + BS MS: no deformity or atrophy Skin: warm and dry, no rash Neuro:  Alert and Oriented x 3, Strength and sensation are intact Psych: euthymic mood, full affect  Wt Readings from Last 3 Encounters:  09/25/15 171 lb 8 oz (77.792 kg)  08/06/15 175 lb 0.6 oz (79.398 kg)  06/11/15 174 lb (78.926 kg)      Studies/Labs Reviewed:   EKG:  EKG is ordered today.  The ekg ordered today demonstrates NSR, normal tracing   Recent Labs: 03/06/2015: ALT 19 06/11/2015: BUN 19; Creatinine, Ser 1.42*; Hemoglobin 12.3; Platelets 241; Potassium 3.6; Sodium 137 07/10/2015: TSH 3.846   Lipid Panel    Component Value Date/Time   CHOL 153 07/10/2015 1327   TRIG 79 07/10/2015 1327   HDL 58 07/10/2015 1327   CHOLHDL 2.6 07/10/2015 1327   VLDL 16 07/10/2015 1327   LDLCALC 79 07/10/2015 1327     ASSESSMENT:    1. Accelerating angina ([place])   2. Atherosclerosis of native coronary artery of native heart with other form of angina pectoris ([place])   3. Coronary vasospasm ([facility])   4. Essential hypertension      PLAN:  In order of problems listed above:  1. Although her symptoms are not 100% typical, the increasing association of chest discomfort with physical activity and the reduced exercise tolerance makes it likely that her symptoms are indeed coronary in etiology. There also sound more like fixed coronary obstruction rather than vasospastic angina. She had a low risk nuclear stress test a year ago and I don't think it makes a lot of sense to repeat that now. I have recommended that we  proceed directly to cardiac catheterization via radial approach. There is little room to enhance medical therapy (other than maybe adding ranolazine), so revascularization is indicated this significant stenosis is identified. 2. In 2011 had scattered 30-40 percent lesions, no significant obstructive stenoses 3. Vasospastic angina is a presumptive  diagnosis due to the response of her symptoms to nitrates/calcium channel blockers in the past 4. Excellent blood pressure control, in fact blood pressure is relatively low  This procedure has been fully reviewed with the patient and informed consent has been obtained. hold hydrochlorothiazide and Indocin prior to the heart catheterization     Medication Adjustments/Labs and Tests Ordered: Current medicines are reviewed at length with the patient today.  Concerns regarding medicines are outlined above.  Medication changes, Labs and Tests ordered today are listed in the Patient Instructions below. Patient Instructions  Your physician has requested that you have a cardiac catheterization with one of the interventional cardiologists. Cardiac catheterization is used to diagnose and/or treat various heart conditions. Doctors may recommend this procedure for a number of different reasons. The most common reason is to evaluate chest pain. Chest pain can be a symptom of coronary artery disease (CAD), and cardiac catheterization can show whether plaque is narrowing or blocking your heart's arteries. This procedure is also used to evaluate the valves, as well as measure the blood flow and oxygen levels in different parts of your heart. For further information please visit [email or web address]. Please follow instruction sheet, as given.3-  Your physician recommends that you return for lab work in: 3-7 days prior to the cardiac cath.         Mikael Spray, MD  09/25/2015 6:07 PM    [facility] [address], [place], [place]  [address] Phone: [phone]; Fax: [phone]

## 2015-09-26 ENCOUNTER — Ambulatory Visit
Admission: RE | Admit: 2015-09-26 | Discharge: 2015-09-26 | Disposition: A | Discharge status: Home / Self Care | Payer: Medicare HMO | Source: Ambulatory Visit | Attending: Cardiovascular Disease | Admitting: Cardiovascular Disease

## 2015-09-26 ENCOUNTER — Telehealth: Payer: Self-pay | Admitting: Cardiovascular Disease

## 2015-09-26 DIAGNOSIS — Z01810 Encounter for preprocedural cardiovascular examination: Secondary | ICD-10-CM

## 2015-09-26 LAB — CBC
HCT: 39.8 % (ref 36.0–46.0)
Hemoglobin: 12.8 g/dL (ref 12.0–15.0)
MCH: 28.8 pg (ref 26.0–34.0)
MCHC: 32.2 g/dL (ref 30.0–36.0)
MCV: 89.4 fL (ref 78.0–100.0)
MPV: 9.6 fL (ref 8.6–12.4)
Platelets: 245 10*3/uL (ref 150–400)
RBC: 4.45 MIL/uL (ref 3.87–5.11)
RDW: 14.6 % (ref 11.5–15.5)
WBC: 5.2 10*3/uL (ref 4.0–10.5)

## 2015-09-26 NOTE — Telephone Encounter (Signed)
Chest 2 view ordered for pre-cath workup.

## 2015-09-27 LAB — BASIC METABOLIC PANEL
BUN: 12 mg/dL (ref 7–25)
CO2: 26 mmol/L (ref 20–31)
Calcium: 8.7 mg/dL (ref 8.6–10.4)
Chloride: 104 mmol/L (ref 98–110)
Creat: 1.06 mg/dL — ABNORMAL HIGH (ref 0.60–0.93)
Glucose, Bld: 79 mg/dL (ref 65–99)
Potassium: 3.9 mmol/L (ref 3.5–5.3)
Sodium: 141 mmol/L (ref 135–146)

## 2015-09-27 LAB — PROTIME-INR
INR: 0.98 (ref <1.50)
Prothrombin Time: 13.1 seconds (ref 11.6–15.2)

## 2015-09-27 LAB — APTT: aPTT: 34 seconds (ref 24–37)

## 2015-09-29 ENCOUNTER — Other Ambulatory Visit: Payer: Self-pay | Admitting: *Deleted

## 2015-09-29 DIAGNOSIS — R079 Chest pain, unspecified: Secondary | ICD-10-CM

## 2015-09-30 ENCOUNTER — Encounter (HOSPITAL_COMMUNITY)
Admission: RE | Disposition: A | Discharge status: Home / Self Care | Payer: Self-pay | Source: Ambulatory Visit | Attending: Cardiovascular Disease

## 2015-09-30 ENCOUNTER — Ambulatory Visit (HOSPITAL_COMMUNITY)
Admission: RE | Admit: 2015-09-30 | Discharge: 2015-09-30 | Disposition: A | Discharge status: Home / Self Care | Payer: Medicare HMO | Source: Ambulatory Visit | Attending: Cardiovascular Disease | Admitting: Cardiovascular Disease

## 2015-09-30 DIAGNOSIS — K219 Gastro-esophageal reflux disease without esophagitis: Secondary | ICD-10-CM | POA: Insufficient documentation

## 2015-09-30 DIAGNOSIS — E669 Obesity, unspecified: Secondary | ICD-10-CM | POA: Diagnosis not present

## 2015-09-30 DIAGNOSIS — Z87891 Personal history of nicotine dependence: Secondary | ICD-10-CM | POA: Diagnosis not present

## 2015-09-30 DIAGNOSIS — Z880 Allergy status to penicillin: Secondary | ICD-10-CM | POA: Insufficient documentation

## 2015-09-30 DIAGNOSIS — Z8719 Personal history of other diseases of the digestive system: Secondary | ICD-10-CM | POA: Diagnosis not present

## 2015-09-30 DIAGNOSIS — G5602 Carpal tunnel syndrome, left upper limb: Secondary | ICD-10-CM | POA: Diagnosis not present

## 2015-09-30 DIAGNOSIS — I2510 Atherosclerotic heart disease of native coronary artery without angina pectoris: Secondary | ICD-10-CM | POA: Diagnosis not present

## 2015-09-30 DIAGNOSIS — M1990 Unspecified osteoarthritis, unspecified site: Secondary | ICD-10-CM | POA: Diagnosis not present

## 2015-09-30 DIAGNOSIS — I25110 Atherosclerotic heart disease of native coronary artery with unstable angina pectoris: Secondary | ICD-10-CM | POA: Insufficient documentation

## 2015-09-30 DIAGNOSIS — M545 Low back pain: Secondary | ICD-10-CM | POA: Insufficient documentation

## 2015-09-30 DIAGNOSIS — I10 Essential (primary) hypertension: Secondary | ICD-10-CM | POA: Insufficient documentation

## 2015-09-30 DIAGNOSIS — Z7982 Long term (current) use of aspirin: Secondary | ICD-10-CM | POA: Diagnosis not present

## 2015-09-30 DIAGNOSIS — G8929 Other chronic pain: Secondary | ICD-10-CM | POA: Insufficient documentation

## 2015-09-30 DIAGNOSIS — E785 Hyperlipidemia, unspecified: Secondary | ICD-10-CM | POA: Insufficient documentation

## 2015-09-30 DIAGNOSIS — D649 Anemia, unspecified: Secondary | ICD-10-CM | POA: Diagnosis not present

## 2015-09-30 DIAGNOSIS — Z981 Arthrodesis status: Secondary | ICD-10-CM | POA: Insufficient documentation

## 2015-09-30 DIAGNOSIS — Z6831 Body mass index (BMI) 31.0-31.9, adult: Secondary | ICD-10-CM | POA: Diagnosis not present

## 2015-09-30 HISTORY — PX: CARDIAC CATHETERIZATION: SHX172

## 2015-09-30 SURGERY — LEFT HEART CATH AND CORONARY ANGIOGRAPHY

## 2015-09-30 MED ORDER — SODIUM CHLORIDE 0.9 % WEIGHT BASED INFUSION
3.0000 mL/kg/h | INTRAVENOUS | Status: AC
  Administered 2015-09-30: 3 mL/kg/h via INTRAVENOUS

## 2015-09-30 MED ORDER — SODIUM CHLORIDE 0.9 % IV SOLN: 250.0000 mL | INTRAVENOUS | Status: DC | PRN

## 2015-09-30 MED ORDER — LIDOCAINE HCL (PF) 1 % IJ SOLN
INTRAMUSCULAR | Status: DC | PRN
  Administered 2015-09-30: 5 mL via INTRADERMAL

## 2015-09-30 MED ORDER — IOHEXOL 350 MG/ML SOLN
INTRAVENOUS | Status: DC | PRN
  Administered 2015-09-30: 55 mL via INTRA_ARTERIAL

## 2015-09-30 MED ORDER — ASPIRIN EC 81 MG PO TBEC
81.0000 mg | DELAYED_RELEASE_TABLET | Freq: Every day | ORAL | Status: DC
  Filled 2015-09-30: qty 1

## 2015-09-30 MED ORDER — NITROGLYCERIN 1 MG/10 ML FOR IR/CATH LAB: INTRA_ARTERIAL | Status: DC | PRN

## 2015-09-30 MED ORDER — SODIUM CHLORIDE 0.9 % IJ SOLN: 3.0000 mL | Freq: Two times a day (BID) | INTRAMUSCULAR | Status: DC

## 2015-09-30 MED ORDER — ASPIRIN 81 MG PO CHEW
81.0000 mg | CHEWABLE_TABLET | ORAL | Status: AC
  Administered 2015-09-30: 81 mg via ORAL

## 2015-09-30 MED ORDER — SODIUM CHLORIDE 0.9 % IJ SOLN: 3.0000 mL | INTRAMUSCULAR | Status: DC | PRN

## 2015-09-30 MED ORDER — LIDOCAINE HCL (PF) 1 % IJ SOLN: INTRAMUSCULAR | Status: DC | PRN

## 2015-09-30 MED ORDER — SODIUM CHLORIDE 0.9 % WEIGHT BASED INFUSION: 1.0000 mL/kg/h | INTRAVENOUS | Status: DC

## 2015-09-30 MED ORDER — HEPARIN (PORCINE) IN NACL 2-0.9 UNIT/ML-% IJ SOLN
INTRAMUSCULAR | Status: DC | PRN
  Administered 2015-09-30: 11:00:00

## 2015-09-30 MED ORDER — ASPIRIN 81 MG PO CHEW
CHEWABLE_TABLET | ORAL | Status: AC
  Administered 2015-09-30: 81 mg via ORAL
  Filled 2015-09-30: qty 1

## 2015-09-30 MED ORDER — LIDOCAINE HCL (PF) 1 % IJ SOLN
INTRAMUSCULAR | Status: AC
  Filled 2015-09-30: qty 30

## 2015-09-30 MED ORDER — MIDAZOLAM HCL 2 MG/2ML IJ SOLN
INTRAMUSCULAR | Status: AC
  Filled 2015-09-30: qty 2

## 2015-09-30 MED ORDER — ONDANSETRON HCL 4 MG/2ML IJ SOLN: 4.0000 mg | Freq: Four times a day (QID) | INTRAMUSCULAR | Status: DC | PRN

## 2015-09-30 MED ORDER — FENTANYL CITRATE (PF) 100 MCG/2ML IJ SOLN
INTRAMUSCULAR | Status: AC
  Filled 2015-09-30: qty 2

## 2015-09-30 MED ORDER — MIDAZOLAM HCL 2 MG/2ML IJ SOLN
INTRAMUSCULAR | Status: DC | PRN
  Administered 2015-09-30: 1 mg via INTRAVENOUS

## 2015-09-30 MED ORDER — FENTANYL CITRATE (PF) 100 MCG/2ML IJ SOLN
INTRAMUSCULAR | Status: DC | PRN
  Administered 2015-09-30: 25 ug via INTRAVENOUS

## 2015-09-30 MED ORDER — HEPARIN (PORCINE) IN NACL 2-0.9 UNIT/ML-% IJ SOLN
INTRAMUSCULAR | Status: AC
  Filled 2015-09-30: qty 1000

## 2015-09-30 MED ORDER — ACETAMINOPHEN 325 MG PO TABS: 650.0000 mg | ORAL_TABLET | ORAL | Status: DC | PRN

## 2015-09-30 MED ORDER — SODIUM CHLORIDE 0.9 % WEIGHT BASED INFUSION: 3.0000 mL/kg/h | INTRAVENOUS | Status: DC

## 2015-09-30 SURGICAL SUPPLY — 8 items
CATH INFINITI 5FR MULTPACK ANG (CATHETERS) ×1 IMPLANT
KIT HEART LEFT (KITS) ×2 IMPLANT
PACK CARDIAC CATHETERIZATION (CUSTOM PROCEDURE TRAY) ×2 IMPLANT
SHEATH PINNACLE 5F 10CM (SHEATH) ×1 IMPLANT
SYR MEDRAD MARK V 150ML (SYRINGE) ×2 IMPLANT
TRANSDUCER W/STOPCOCK (MISCELLANEOUS) ×2 IMPLANT
TUBING CIL FLEX 10 FLL-RA (TUBING) ×2 IMPLANT
WIRE EMERALD 3MM-J .035X150CM (WIRE) ×1 IMPLANT

## 2015-09-30 NOTE — Research (Signed)
[place] Informed Consent   Subject Name: [name]  Subject met inclusion and exclusion criteria.  The informed consent form, study requirements and expectations were reviewed with the subject and questions and concerns were addressed prior to the signing of the consent form.  The subject verbalized understanding of the trail requirements.  The subject agreed to participate in the [place] trial and signed the informed consent.  The informed consent was obtained prior to performance of any protocol-specific procedures for the subject.  A copy of the signed informed consent was given to the subject and a copy was placed in the subject's medical record.  Marlana Salvage 09/30/2015, 07:55

## 2015-09-30 NOTE — Interval H&P Note (Signed)
Cath Lab Visit (complete for each Cath Lab visit)  Clinical Evaluation Leading to the Procedure:   ACS: No.  Non-ACS:    Anginal Classification: CCS III  Anti-ischemic medical therapy: Maximal Therapy (2 or more classes of medications)  Non-Invasive Test Results: No non-invasive testing performed  Prior CABG: No previous CABG      History and Physical Interval Note:  09/30/2015 11:09 AM  [name]  has presented today for surgery, with the diagnosis of angina  The various methods of treatment have been discussed with the patient and family. After consideration of risks, benefits and other options for treatment, the patient has consented to  Procedure(s): Left Heart Cath and Coronary Angiography (N/A) as a surgical intervention .  The patient's history has been reviewed, patient examined, no change in status, stable for surgery.  I have reviewed the patient's chart and labs.  Questions were answered to the patient's satisfaction.     KELLY,THOMAS A

## 2015-09-30 NOTE — Discharge Instructions (Signed)

## 2015-09-30 NOTE — H&P (View-Only) (Signed)
Patient ID: Seville Wantz, female   DOB: 01-30-1934, 82 y.o.   MRN: [id number]     Cardiology Office Note    Date:  09/25/2015   ID:  Freda Morse, DOB 1933/09/30, MRN [id number]  PCP:  Tula Nakayama, MD  Cardiologist:   Sanda Klein, MD   Chief Complaint  Patient presents with  . Annual Exam    patient reports chest pain/discomfort becoming more frequently (almost daily)- these episodes occur when laying down at night, and sometimes with exertion    History of Present Illness:  Lella Saric is a 82 y.o. female with known moderate coronary stenosis by previous coronary angiography and a suspected diagnosis of coronary vasospasm. Over the last year she has had a pattern of gradually worsening exercise intolerance, more suggestive of accelerating stable angina pectoris. She develops chest tightness usually with activity, less commonly at rest. She has to stop twice to get to her mailbox due to both tightness in her chest and shortness of breath. The symptoms have worsened on at a very steady slow pace. She has had a few episodes of chest discomfort at rest. She reports improvement with sublingual nitroglycerin. She is already on antianginals (maximum dose amlodipine, long-acting nitrates). Her blood pressure is fairly low, not leaving much room for additional antianginal therapy.   She also has a history of gastroesophageal reflux disease and it is sometimes difficult to distinguish the origin of her symptoms. Some episodes of happened while she is reclining, but most episodes of chest discomfort have occurred while sitting up. She is compliant with omeprazole therapy.  She had a low risk nuclear stress test roughly one year ago. In 2011 she had a non-ST elevation MI type 2 due to demand ischemia. Cardiac cath was done at that time with minimal coronary atherosclerosis (30-40% lesions were seen scattered throughout the coronary tree especially prominent in the mid right coronary  and distal left circumflex coronary artery) resulting in presupmtive diagnosis of vasospastic angina/infarction was made. Has been treated for coronary disease with ASA, statin since that time.   Past Medical History  Diagnosis Date  . Hyperlipidemia     takes Lipitor nightly  . Obesity   . DJD (degenerative joint disease)     Severe  . CAD (coronary artery disease)   . Chronic back pain   . Carpal tunnel syndrome on left     Left Hand pain with numbness, new onset   . Difficult intubation     small airway  . Hypertension     takes Amlodipine and Metoprolol daily  . Blood clot associated with vein wall inflammation     hx of in right leg  . Sleep apnea     sleep study done 30yrs ago  . Shortness of breath     with exertion  . Pneumonia     hx of-in 2011  . Headache(784.0)     occasionally  . Chronic back pain     buldging disc  . GERD (gastroesophageal reflux disease)     takes Omeprazole daily  . Gastric ulcer     hx of   . Constipation   . Diarrhea   . Nocturia   . Anemia   . Thyroid mass     right    Past Surgical History  Procedure Laterality Date  . Appendectomy  1987  . Carpal tunnel release right      x 2  . Partial hysterectomy  1972  . Arthroscopy left shoulder  1999  .  Arthoscopy left knee  1992  . Back  surgery  lumbar  1989 /1999    x 2  . Tonsillectomy    . Rotator cuff repair      right  . Colonoscopy    . Esophagogastroduodenoscopy    . Cholecystectomy    . Thyroidectomy  11/17/2011    Procedure: THYROIDECTOMY;  Surgeon: Ascencion Dike, MD;  Location: [facility];  Service: ENT;  Laterality: Right;  WITH FROZEN SECTION  . Cardiac catheterization  05/2010  . Abdominal hysterectomy    . Back surgery    . Nm myocar perf wall motion  11/12/2008    no ischemia  . Spine surgery    . Lumbar fusion  March 19, 2014    Outpatient Prescriptions Prior to Visit  Medication Sig Dispense Refill  . amLODipine (NORVASC) 10 MG tablet take 1 tablet by mouth once  daily 30 tablet 5  . Ascorbic Acid (VITAMIN C) 500 MG tablet Take 500 mg by mouth daily.     Marland Kitchen aspirin EC 81 MG tablet Take 81 mg by mouth daily.    Marland Kitchen atorvastatin (LIPITOR) 20 MG tablet Take 1 tablet (20 mg total) by mouth at bedtime. 90 tablet 0  . calcium carbonate (OS-CAL - DOSED IN MG OF ELEMENTAL CALCIUM) 1250 MG tablet Take 1 tablet by mouth daily.    . Cyanocobalamin (B-12) 1000 MCG CAPS Take 1 capsule by mouth daily.    . diclofenac sodium (VOLTAREN) 1 % GEL Apply 1 application topically 4 (four) times daily. (Patient taking differently: Apply 2 g topically 2 (two) times daily as needed (for back pain). ) 100 g 4  . EPIPEN 2-PAK 0.3 MG/0.3ML SOAJ injection INJECT AS DIRECTED FOR SEVERE ALLERGIC REACTIONS 2 Device 1  . ferrous sulfate 325 (65 FE) MG tablet Take 325 mg by mouth daily with breakfast.     . FLUoxetine (PROZAC) 20 MG tablet take 1 and 1/2 tablet by mouth once daily (Patient taking differently: Take 30 mg by mouth daily. ) 45 tablet 5  . fluticasone (FLONASE) 50 MCG/ACT nasal spray Place 1 spray into both nostrils daily. 16 g 2  . hydrochlorothiazide (MICROZIDE) 12.5 MG capsule take 1 capsule by mouth once daily 30 capsule 5  . HYDROcodone-acetaminophen (NORCO) 7.5-325 MG tablet Take 1 tablet by mouth 2 (two) times daily as needed for moderate pain. (Patient taking differently: Take 1 tablet by mouth daily as needed for moderate pain. ) 30 tablet 0  . indomethacin (INDOCIN) 25 MG capsule Take 1 capsule (25 mg total) by mouth daily. 30 capsule 0  . isosorbide mononitrate (IMDUR) 30 MG 24 hr tablet Take 1 tablet (30 mg total) by mouth daily. 30 tablet 5  . levothyroxine (SYNTHROID, LEVOTHROID) 25 MCG tablet Take 1 tablet (25 mcg total) by mouth every morning. 90 tablet 0  . Multiple Vitamin (MULITIVITAMIN WITH MINERALS) TABS Take 1 tablet by mouth daily.    . nitroGLYCERIN (NITROLINGUAL) 0.4 MG/SPRAY spray Place 1 spray under the tongue every 5 (five) minutes x 3 doses as needed  for chest pain. 12 g 1  . omeprazole (PRILOSEC) 40 MG capsule take 1 capsule by mouth once daily 30 capsule 5  . potassium chloride (K-DUR) 10 MEQ tablet take 1 tablet by mouth twice a day 60 tablet 5  . potassium chloride (K-DUR,KLOR-CON) 10 MEQ tablet take 1 tablet by mouth twice a day (Patient not taking: Reported on 09/25/2015) 60 tablet 5  . predniSONE (STERAPRED UNI-PAK 21  TAB) 5 MG (21) TBPK tablet Take 1 tablet (5 mg total) by mouth as directed. Use as directed (Patient not taking: Reported on 09/25/2015) 21 tablet 0  . pyridoxine (B-6) 200 MG tablet Take 200 mg by mouth daily.     Marland Kitchen ZOVIRAX 5 % APPLY TOPICALLY EVERY 3 HOURS. 30 g 1   No facility-administered medications prior to visit.     Allergies:   Ace inhibitors; Bupropion hcl; Codeine; Penicillins; Shellfish allergy; and Angiotensin receptor blockers   Social History   Social History  . Marital Status: Widowed    Spouse Name: N/A  . Number of Children: 4  . Years of Education: N/A   Occupational History  .     Social History Main Topics  . Smoking status: Former Smoker    Types: Cigarettes    Quit date: 09/06/1984  . Smokeless tobacco: None  . Alcohol Use: No     Comment: Casual  . Drug Use: No  . Sexual Activity: No   Other Topics Concern  . None   Social History Narrative     Family History:  The patient's family history includes Anuerysm in her mother; Stroke in her father. There is no history of Anesthesia problems, Hypotension, Malignant hyperthermia, or Pseudochol deficiency.   ROS:   Please see the history of present illness.    ROS All other systems reviewed and are negative.   PHYSICAL EXAM:   VS:  BP 102/64 mmHg  Pulse 78  Ht 5\' 2"  (1.575 m)  Wt 171 lb 8 oz (77.792 kg)  BMI 31.36 kg/m2   GEN: Well nourished, well developed, in no acute distress HEENT: normal Neck: no JVD, carotid bruits, or masses Cardiac: RRR; no murmurs, rubs, or gallops,no edema  Respiratory:  clear to auscultation  bilaterally, normal work of breathing GI: soft, nontender, nondistended, + BS MS: no deformity or atrophy Skin: warm and dry, no rash Neuro:  Alert and Oriented x 3, Strength and sensation are intact Psych: euthymic mood, full affect  Wt Readings from Last 3 Encounters:  09/25/15 171 lb 8 oz (77.792 kg)  08/06/15 175 lb 0.6 oz (79.398 kg)  06/11/15 174 lb (78.926 kg)      Studies/Labs Reviewed:   EKG:  EKG is ordered today.  The ekg ordered today demonstrates NSR, normal tracing   Recent Labs: 03/06/2015: ALT 19 06/11/2015: BUN 19; Creatinine, Ser 1.42*; Hemoglobin 12.3; Platelets 241; Potassium 3.6; Sodium 137 07/10/2015: TSH 3.846   Lipid Panel    Component Value Date/Time   CHOL 153 07/10/2015 1327   TRIG 79 07/10/2015 1327   HDL 58 07/10/2015 1327   CHOLHDL 2.6 07/10/2015 1327   VLDL 16 07/10/2015 1327   LDLCALC 79 07/10/2015 1327     ASSESSMENT:    1. Accelerating angina ([place])   2. Atherosclerosis of native coronary artery of native heart with other form of angina pectoris ([place])   3. Coronary vasospasm ([facility])   4. Essential hypertension      PLAN:  In order of problems listed above:  1. Although her symptoms are not 100% typical, the increasing association of chest discomfort with physical activity and the reduced exercise tolerance makes it likely that her symptoms are indeed coronary in etiology. There also sound more like fixed coronary obstruction rather than vasospastic angina. She had a low risk nuclear stress test a year ago and I don't think it makes a lot of sense to repeat that now. I have recommended that we  proceed directly to cardiac catheterization via radial approach. There is little room to enhance medical therapy (other than maybe adding ranolazine), so revascularization is indicated this significant stenosis is identified. 2. In 2011 had scattered 30-40 percent lesions, no significant obstructive stenoses 3. Vasospastic angina is a presumptive  diagnosis due to the response of her symptoms to nitrates/calcium channel blockers in the past 4. Excellent blood pressure control, in fact blood pressure is relatively low  This procedure has been fully reviewed with the patient and informed consent has been obtained. hold hydrochlorothiazide and Indocin prior to the heart catheterization     Medication Adjustments/Labs and Tests Ordered: Current medicines are reviewed at length with the patient today.  Concerns regarding medicines are outlined above.  Medication changes, Labs and Tests ordered today are listed in the Patient Instructions below. Patient Instructions  Your physician has requested that you have a cardiac catheterization with one of the interventional cardiologists. Cardiac catheterization is used to diagnose and/or treat various heart conditions. Doctors may recommend this procedure for a number of different reasons. The most common reason is to evaluate chest pain. Chest pain can be a symptom of coronary artery disease (CAD), and cardiac catheterization can show whether plaque is narrowing or blocking your heart's arteries. This procedure is also used to evaluate the valves, as well as measure the blood flow and oxygen levels in different parts of your heart. For further information please visit [email or web address]. Please follow instruction sheet, as given.3-  Your physician recommends that you return for lab work in: 3-7 days prior to the cardiac cath.         Mikael Spray, MD  09/25/2015 6:07 PM    [facility] [address], [place], [place]  [address] Phone: [phone]; Fax: [phone]

## 2015-09-30 NOTE — Progress Notes (Signed)
Order for sheath removal verified per post procedural orders. Procedure explained to patient and Rt femoral artery access site assessed: level 0, palpable dorsalis pedis and posterior tibial pulses. 6 French Sheath removed and manual pressure applied for 15 minutes. Pre, peri, & post procedural vitals: HR67, RR 12, O2 Sat upper 90, BP 151/96,Pain 0. Distal pulses remained intact after sheath removal. Access site level 0 and dressed with 4X4 gauze and tegaderm. RN confirmed condition of site. Post procedural instructions discussed with return demonstration from patient.

## 2015-10-01 ENCOUNTER — Encounter (HOSPITAL_COMMUNITY): Payer: Self-pay | Admitting: Cardiovascular Disease

## 2015-10-06 ENCOUNTER — Ambulatory Visit: Payer: Medicare HMO | Admitting: Cardiovascular Disease

## 2015-10-10 ENCOUNTER — Ambulatory Visit (INDEPENDENT_AMBULATORY_CARE_PROVIDER_SITE_OTHER): Payer: Medicare HMO | Admitting: Cardiovascular Disease

## 2015-10-10 ENCOUNTER — Encounter: Payer: Self-pay | Admitting: Cardiovascular Disease

## 2015-10-10 VITALS — BP 134/87 | HR 80 | Ht 63.0 in | Wt 176.0 lb

## 2015-10-10 DIAGNOSIS — I201 Angina pectoris with documented spasm: Secondary | ICD-10-CM

## 2015-10-10 DIAGNOSIS — I25118 Atherosclerotic heart disease of native coronary artery with other forms of angina pectoris: Secondary | ICD-10-CM

## 2015-10-10 DIAGNOSIS — I10 Essential (primary) hypertension: Secondary | ICD-10-CM | POA: Diagnosis not present

## 2015-10-10 DIAGNOSIS — E785 Hyperlipidemia, unspecified: Secondary | ICD-10-CM

## 2015-10-10 DIAGNOSIS — E669 Obesity, unspecified: Secondary | ICD-10-CM

## 2015-10-10 NOTE — Patient Instructions (Signed)
Your physician has recommended you make the following change in your medication: INCREASE YOUR ISOSORBIDE TO 60MG  DAILY - IF THIS MAKES YOU FEEL BETTER PLEASE CALL AND WE WILL SEND A NEW RX TO YOUR PHARMACY.  Dr. Sallyanne Kuster recommends that you schedule a follow-up appointment in: [place]

## 2015-10-10 NOTE — Progress Notes (Signed)
Patient ID: [name], female   DOB: 01-Nov-1933, 82 y.o.   MRN: [id number]     Cardiology Office Note    Date:  10/12/2015   ID:  [name], DOB May 11, 1934, MRN [id number]  PCP:  Tula Nakayama, MD  Cardiologist:   Sanda Klein, MD   Chief Complaint  Patient presents with  . Follow-up     no chest pain, no shortness of breath, no edema, no pain or cramping in legs, no lightheadedness or dizziness    History of Present Illness:  [name] is a 82 y.o. female with intermittent chest discomfort suggestive of angina pectoris who returns in follow-up after undergoing coronary angiography on January 19. The study did not show any evidence of significant coronary stenoses. She has scattered 20% lesions in all 3 coronary arteries. Also of note, left ventricular systolic function was normal and left ventricular end-diastolic pressure was low at 8 mmHg. The cardiac catheterization was initially attempted via the left radial artery, unsuccessfully, then performed via the right femoral artery. She has a small right infrainguinal hematoma and a small left forearm ecchymosis.  The possibility of coronary spasm has been entertained for years and it is hard to prove or disprove. She has numerous vascular risk factors including hyperlipidemia (on atorvastatin), borderline diabetes mellitus, obesity and hypertension. Her blood pressure is treated with maximum dose of amlodipine. She quit smoking 30 years ago..  Past Medical History  Diagnosis Date  . Hyperlipidemia     takes Lipitor nightly  . Obesity   . DJD (degenerative joint disease)     Severe  . CAD (coronary artery disease)   . Chronic back pain   . Carpal tunnel syndrome on left     Left Hand pain with numbness, new onset   . Difficult intubation     small airway  . Hypertension     takes Amlodipine and Metoprolol daily  . Blood clot associated with vein wall inflammation     hx of in right leg  . Sleep apnea    sleep study done 46yrs ago  . Shortness of breath     with exertion  . Pneumonia     hx of-in 2011  . Headache(784.0)     occasionally  . Chronic back pain     buldging disc  . GERD (gastroesophageal reflux disease)     takes Omeprazole daily  . Gastric ulcer     hx of   . Constipation   . Diarrhea   . Nocturia   . Anemia   . Thyroid mass     right    Past Surgical History  Procedure Laterality Date  . Appendectomy  1987  . Carpal tunnel release right      x 2  . Partial hysterectomy  1972  . Arthroscopy left shoulder  1999  . Arthoscopy left knee  1992  . Back  surgery  lumbar  1989 /1999    x 2  . Tonsillectomy    . Rotator cuff repair      right  . Colonoscopy    . Esophagogastroduodenoscopy    . Cholecystectomy    . Thyroidectomy  11/17/2011    Procedure: THYROIDECTOMY;  Surgeon: Ascencion Dike, MD;  Location: [facility];  Service: ENT;  Laterality: Right;  WITH FROZEN SECTION  . Cardiac catheterization  05/2010  . Abdominal hysterectomy    . Back surgery    . Nm myocar perf wall motion  11/12/2008  no ischemia  . Spine surgery    . Lumbar fusion  March 19, 2014  . Cardiac catheterization N/A 09/30/2015    Procedure: Left Heart Cath and Coronary Angiography;  Surgeon: Troy Sine, MD;  Location: [facility];  Service: Cardiovascular;  Laterality: N/A;    Outpatient Prescriptions Prior to Visit  Medication Sig Dispense Refill  . amLODipine (NORVASC) 10 MG tablet take 1 tablet by mouth once daily 30 tablet 5  . Ascorbic Acid (VITAMIN C) 500 MG tablet Take 500 mg by mouth daily.     Marland Kitchen aspirin EC 81 MG tablet Take 81 mg by mouth daily.    Marland Kitchen atorvastatin (LIPITOR) 20 MG tablet Take 1 tablet (20 mg total) by mouth at bedtime. 90 tablet 0  . calcium carbonate (OS-CAL - DOSED IN MG OF ELEMENTAL CALCIUM) 1250 MG tablet Take 1 tablet by mouth daily.    . Cyanocobalamin (B-12) 1000 MCG CAPS Take 1 capsule by mouth daily.    . diclofenac sodium (VOLTAREN) 1 % GEL Apply 1  application topically 4 (four) times daily. (Patient taking differently: Apply 2 g topically 2 (two) times daily as needed (for back pain). ) 100 g 4  . EPIPEN 2-PAK 0.3 MG/0.3ML SOAJ injection INJECT AS DIRECTED FOR SEVERE ALLERGIC REACTIONS 2 Device 1  . ferrous sulfate 325 (65 FE) MG tablet Take 325 mg by mouth daily with breakfast.     . FLUoxetine (PROZAC) 20 MG tablet take 1 and 1/2 tablet by mouth once daily (Patient taking differently: Take 30 mg by mouth daily. ) 45 tablet 5  . fluticasone (FLONASE) 50 MCG/ACT nasal spray Place 1 spray into both nostrils daily. 16 g 2  . hydrochlorothiazide (MICROZIDE) 12.5 MG capsule take 1 capsule by mouth once daily 30 capsule 5  . HYDROcodone-acetaminophen (NORCO) 7.5-325 MG tablet Take 1 tablet by mouth 2 (two) times daily as needed for moderate pain. (Patient taking differently: Take 1 tablet by mouth daily as needed for moderate pain. ) 30 tablet 0  . indomethacin (INDOCIN) 25 MG capsule Take 1 capsule (25 mg total) by mouth daily. 30 capsule 0  . isosorbide mononitrate (IMDUR) 30 MG 24 hr tablet Take 1 tablet (30 mg total) by mouth daily. 30 tablet 5  . levothyroxine (SYNTHROID, LEVOTHROID) 25 MCG tablet Take 1 tablet (25 mcg total) by mouth every morning. 90 tablet 0  . Multiple Vitamin (MULITIVITAMIN WITH MINERALS) TABS Take 1 tablet by mouth daily.    . nitroGLYCERIN (NITROLINGUAL) 0.4 MG/SPRAY spray Place 1 spray under the tongue every 5 (five) minutes x 3 doses as needed for chest pain. 12 g 1  . omeprazole (PRILOSEC) 40 MG capsule take 1 capsule by mouth once daily 30 capsule 5  . potassium chloride (K-DUR) 10 MEQ tablet take 1 tablet by mouth twice a day 60 tablet 5  . potassium chloride (K-DUR,KLOR-CON) 10 MEQ tablet take 1 tablet by mouth twice a day 60 tablet 5  . predniSONE (STERAPRED UNI-PAK 21 TAB) 5 MG (21) TBPK tablet Take 1 tablet (5 mg total) by mouth as directed. Use as directed 21 tablet 0  . pyridoxine (B-6) 200 MG tablet Take 200  mg by mouth daily.     Marland Kitchen ZOVIRAX 5 % APPLY TOPICALLY EVERY 3 HOURS. 30 g 1   No facility-administered medications prior to visit.     Allergies:   Ace inhibitors; Bupropion hcl; Codeine; Penicillins; Shellfish allergy; and Angiotensin receptor blockers   Social History  Social History  . Marital Status: Widowed    Spouse Name: N/A  . Number of Children: 4  . Years of Education: N/A   Occupational History  .     Social History Main Topics  . Smoking status: Former Smoker    Types: Cigarettes    Quit date: 09/06/1984  . Smokeless tobacco: None  . Alcohol Use: No     Comment: Casual  . Drug Use: No  . Sexual Activity: No   Other Topics Concern  . None   Social History Narrative     Family History:  The patient's family history includes Anuerysm in her mother; Stroke in her father. There is no history of Anesthesia problems, Hypotension, Malignant hyperthermia, or Pseudochol deficiency.   ROS:   Please see the history of present illness.    ROS All other systems reviewed and are negative.   PHYSICAL EXAM:   VS:  BP 134/87 mmHg  Pulse 80  Ht 5\' 3"  (1.6 m)  Wt 79.833 kg (176 lb)  BMI 31.18 kg/m2   GEN: Well nourished, well developed, in no acute distress HEENT: normal Neck: no JVD, carotid bruits, or masses Cardiac: RRR; no murmurs, rubs, or gallops,no edema  Respiratory:  clear to auscultation bilaterally, normal work of breathing GI: soft, nontender, nondistended, + BS MS: no deformity or atrophy Skin: warm and dry, no rash Neuro:  Alert and Oriented x 3, Strength and sensation are intact Psych: euthymic mood, full affect  Wt Readings from Last 3 Encounters:  10/10/15 79.833 kg (176 lb)  09/30/15 78.926 kg (174 lb)  09/25/15 77.792 kg (171 lb 8 oz)      Studies/Labs Reviewed:   EKG:  EKG is not ordered today.    Recent Labs: 03/06/2015: ALT 19 07/10/2015: TSH 3.846 09/25/2015: BUN 12; Creat 1.06*; Hemoglobin 12.8; Platelets 245; Potassium 3.9;  Sodium 141   Lipid Panel    Component Value Date/Time   CHOL 153 07/10/2015 1327   TRIG 79 07/10/2015 1327   HDL 58 07/10/2015 1327   CHOLHDL 2.6 07/10/2015 1327   VLDL 16 07/10/2015 1327   LDLCALC 79 07/10/2015 1327       ASSESSMENT:    1. Atherosclerosis of native coronary artery of native heart with other form of angina pectoris ([place])   2. Coronary vasospasm ([facility])   3. Obesity   4. Essential hypertension   5. Hyperlipidemia      PLAN:  In order of problems listed above:  1. Coronary atherosclerosis without significant stenoses at cath. Will pursue risk factor modification. 2. Possible vasospastic angina: We'll increase her dose of isosorbide mononitrate to 60 mg daily. Told her I cannot be sure that she does not have an alternative cause of chest pain such as esophageal spasm. Also discussed the fact the coronary spasm is rarely a high-risk phenomenon. 3. Weight loss would be beneficial to prevent progression to full-blown diabetes 4. HTN: Well controlled. Prefer use of vasodilators for her blood pressure control. 5. HLP:  All lipid parameters are at optimal range or close to it.    Medication Adjustments/Labs and Tests Ordered: Current medicines are reviewed at length with the patient today.  Concerns regarding medicines are outlined above.  Medication changes, Labs and Tests ordered today are listed in the Patient Instructions below. Patient Instructions  Your physician has recommended you make the following change in your medication: INCREASE YOUR ISOSORBIDE TO 60MG  DAILY - IF THIS MAKES YOU FEEL BETTER PLEASE CALL AND WE WILL  SEND A NEW RX TO YOUR PHARMACY.  Dr. Sallyanne Kuster recommends that you schedule a follow-up appointment in: ONE YEAR          SignedSanda Klein, MD  10/12/2015 12:02 PM    [facility], [place], [place]  [address] Phone: [phone]; Fax: [phone]

## 2015-10-12 ENCOUNTER — Encounter: Payer: Self-pay | Admitting: Cardiovascular Disease

## 2015-10-23 ENCOUNTER — Ambulatory Visit (HOSPITAL_COMMUNITY)
Admission: RE | Admit: 2015-10-23 | Discharge: 2015-10-23 | Disposition: A | Discharge status: Home / Self Care | Payer: Medicare HMO | Source: Ambulatory Visit | Attending: Family Medicine | Admitting: Family Medicine

## 2015-10-23 DIAGNOSIS — Z1231 Encounter for screening mammogram for malignant neoplasm of breast: Secondary | ICD-10-CM

## 2015-10-30 ENCOUNTER — Telehealth: Payer: Self-pay | Admitting: Family Medicine

## 2015-10-30 NOTE — Telephone Encounter (Signed)
pls print registry before I can address

## 2015-10-30 NOTE — Telephone Encounter (Signed)
Next appt 12/04/15.  Patient is fine with two tabs daily as old rx was written. Is coming in then am to collect script

## 2015-10-30 NOTE — Telephone Encounter (Signed)
thanks

## 2015-10-30 NOTE — Telephone Encounter (Signed)
Wanting refill. Last one was 12/12 for 30 tabs. One time fill. Ok to refill for pt c/o severe right knee pain?

## 2015-10-30 NOTE — Telephone Encounter (Signed)
Patient is asking if Dr. Moshe Cipro would refill the HYDROcodone-acetaminophen ([place]) 7.5-325 MG tablet because she is having severe right knee pain

## 2015-10-30 NOTE — Telephone Encounter (Signed)
Printed and in your box.

## 2015-10-31 ENCOUNTER — Other Ambulatory Visit: Payer: Self-pay

## 2015-10-31 MED ORDER — HYDROCODONE-ACETAMINOPHEN 7.5-325 MG PO TABS: 1.0000 | ORAL_TABLET | Freq: Two times a day (BID) | ORAL | Status: DC | PRN

## 2015-11-13 ENCOUNTER — Other Ambulatory Visit: Payer: Self-pay

## 2015-11-13 MED ORDER — OMEPRAZOLE 40 MG PO CPDR
40.0000 mg | DELAYED_RELEASE_CAPSULE | Freq: Every day | ORAL | Status: DC
Start: 2015-11-13 — End: 2017-01-27

## 2015-12-04 ENCOUNTER — Ambulatory Visit (INDEPENDENT_AMBULATORY_CARE_PROVIDER_SITE_OTHER): Payer: Medicare HMO | Admitting: Family Medicine

## 2015-12-04 ENCOUNTER — Encounter: Payer: Self-pay | Admitting: Family Medicine

## 2015-12-04 ENCOUNTER — Telehealth: Payer: Self-pay | Admitting: Family Medicine

## 2015-12-04 VITALS — BP 122/84 | HR 86 | Resp 16 | Ht 63.0 in | Wt 177.0 lb

## 2015-12-04 DIAGNOSIS — Z1211 Encounter for screening for malignant neoplasm of colon: Secondary | ICD-10-CM | POA: Diagnosis not present

## 2015-12-04 DIAGNOSIS — Z0000 Encounter for general adult medical examination without abnormal findings: Secondary | ICD-10-CM | POA: Diagnosis not present

## 2015-12-04 DIAGNOSIS — E785 Hyperlipidemia, unspecified: Secondary | ICD-10-CM | POA: Diagnosis not present

## 2015-12-04 DIAGNOSIS — E890 Postprocedural hypothyroidism: Secondary | ICD-10-CM | POA: Diagnosis not present

## 2015-12-04 DIAGNOSIS — E559 Vitamin D deficiency, unspecified: Secondary | ICD-10-CM

## 2015-12-04 DIAGNOSIS — I10 Essential (primary) hypertension: Secondary | ICD-10-CM | POA: Diagnosis not present

## 2015-12-04 LAB — LIPID PANEL
Cholesterol: 133 mg/dL (ref 125–200)
HDL: 52 mg/dL (ref >=46)
LDL Cholesterol: 56 mg/dL (ref <130)
Total CHOL/HDL Ratio: 2.6 Ratio (ref <=5.0)
Triglycerides: 126 mg/dL (ref <150)
VLDL: 25 mg/dL (ref <30)

## 2015-12-04 LAB — COMPREHENSIVE METABOLIC PANEL
ALT: 18 U/L (ref 6–29)
AST: 30 U/L (ref 10–35)
Albumin: 4 g/dL (ref 3.6–5.1)
Alkaline Phosphatase: 81 U/L (ref 33–130)
BUN: 16 mg/dL (ref 7–25)
CO2: 30 mmol/L (ref 20–31)
Calcium: 9.3 mg/dL (ref 8.6–10.4)
Chloride: 102 mmol/L (ref 98–110)
Creat: 1.01 mg/dL — ABNORMAL HIGH (ref 0.60–0.93)
Glucose, Bld: 101 mg/dL — ABNORMAL HIGH (ref 65–99)
Potassium: 4.1 mmol/L (ref 3.5–5.3)
Sodium: 139 mmol/L (ref 135–146)
Total Bilirubin: 0.5 mg/dL (ref 0.2–1.2)
Total Protein: 6.7 g/dL (ref 6.1–8.1)

## 2015-12-04 LAB — HEMOCCULT GUIAC POC 1CARD (OFFICE): Fecal Occult Blood, POC: NEGATIVE

## 2015-12-04 LAB — TSH: TSH: 3.56 mIU/L

## 2015-12-04 MED ORDER — HYDROCODONE-ACETAMINOPHEN 7.5-325 MG PO TABS: 1.0000 | ORAL_TABLET | Freq: Two times a day (BID) | ORAL | Status: DC | PRN

## 2015-12-04 MED ORDER — AMLODIPINE BESYLATE 10 MG PO TABS: 10.0000 mg | ORAL_TABLET | Freq: Every day | ORAL | Status: DC

## 2015-12-04 NOTE — Telephone Encounter (Signed)
Patient forgot to ask Dr. Moshe Cipro for a Rx for Hydrocodone when she was in here, she is asking for a Rx, please advise?

## 2015-12-04 NOTE — Telephone Encounter (Signed)
Script ready to be collected and pt aware

## 2015-12-04 NOTE — Progress Notes (Signed)
   Subjective:    Patient ID: [name], female    DOB: 1933/11/21, 82 y.o.   MRN: [id number]  HPI Patient is in for annual physical exam. C/o increased and uncontrolled right knee pain, stiffness, and instability. Has been advised of the need to have surgery, but she is terrified of this.I have advised her to discuss with ehr children so that she is aware of their support, she is to come in for telephone interview next week Recent labs, if available are reviewed. Immunization is reviewed , and  updated if needed.    Review of Systems See HPI     Objective:   Physical Exam  BP 122/84 mmHg  Pulse 86  Resp 16  Ht 5\' 3"  (1.6 m)  Wt 177 lb (80.287 kg)  BMI 31.36 kg/m2  SpO2 95%  Pleasant well nourished female, alert and oriented x 3, in no cardio-pulmonary distress. Afebrile. HEENT No facial trauma or asymetry. Sinuses non tender.  Extra occullar muscles intact, pupils equally reactive to light. External ears normal, tympanic membranes clear. Oropharynx moist, no exudate, good dentition. Neck: supple, no adenopathy,JVD or thyromegaly.No bruits.  Chest: Clear to ascultation bilaterally.No crackles or wheezes. Non tender to palpation  Breast: No asymetry,no masses or lumps. No tenderness. No nipple discharge or inversion. No axillary or supraclavicular adenopathy  Cardiovascular system; Heart sounds normal,  S1 and  S2 ,no S3.  No murmur, or thrill. Apical beat not displaced Peripheral pulses normal.  Abdomen: Soft, non tender, no organomegaly or masses. No bruits. Bowel sounds normal. No guarding, tenderness or rebound.  Rectal:  Normal sphincter tone. No mass.No rectal masses.  Guaiac negative stool.  GU: External genitalia normal female genitalia , female distribution of hair. No lesions. Urethral meatus normal in size, no  Prolapse, no lesions visibly  Present. Bladder non tender. Vagina pink and moist , with no visible lesions , discharge present  . Adequate pelvic support no  cystocele or rectocele noted  Uterus absent , no adnexal masses, no  adnexal tenderness.   Musculoskeletal exam: Decreased  ROM of spine, hips ,  and knees. o deformity ,swelling and  crepitus noted of right knee No muscle wasting or atrophy.   Neurologic: Cranial nerves 2 to 12 intact. Power, tone ,sensation and reflexes normal throughout.  disturbance in gait noted,  No tremor.  Skin: Intact, no ulceration, erythema , scaling or rash noted. Pigmentation normal throughout  Psych; Tearful , anxious  mood . Judgement and concentration normal       Assessment & Plan:  Annual physical exam Annual exam as documented. Counseling done  re healthy lifestyle involving commitment to 150 minutes exercise per week, heart healthy diet, and attaining healthy weight.The importance of adequate sleep also discussed. Regular seat belt use and home safety, is also discussed. Changes in health habits are decided on by the patient with goals and time frames  set for achieving them. Immunization and cancer screening needs are specifically addressed at this visit.

## 2015-12-04 NOTE — Patient Instructions (Addendum)
Annual wellness mid July, call if you need me sooner  PLEASE PLAN to COME in one day next week, around 12:45 , so that I can speak on the telephone in your presence with one of your daughters, regarding your knee  Thankful that your daughter in [place] has recovered fully   Labs are due   Thanks for choosing [facility], we consider it a privelige to serve you.

## 2015-12-05 LAB — VITAMIN D 25 HYDROXY (VIT D DEFICIENCY, FRACTURES): Vit D, 25-Hydroxy: 51 ng/mL (ref 30–100)

## 2015-12-07 DIAGNOSIS — Z09 Encounter for follow-up examination after completed treatment for conditions other than malignant neoplasm: Secondary | ICD-10-CM | POA: Insufficient documentation

## 2015-12-07 NOTE — Assessment & Plan Note (Signed)

## 2015-12-23 ENCOUNTER — Other Ambulatory Visit: Payer: Self-pay

## 2015-12-23 MED ORDER — EPINEPHRINE 0.3 MG/0.3ML IJ SOAJ: INTRAMUSCULAR | Status: DC

## 2015-12-23 MED ORDER — HYDROCODONE-ACETAMINOPHEN 7.5-325 MG PO TABS: 1.0000 | ORAL_TABLET | Freq: Two times a day (BID) | ORAL | Status: DC | PRN

## 2016-01-16 ENCOUNTER — Other Ambulatory Visit: Payer: Self-pay

## 2016-01-16 MED ORDER — HYDROCODONE-ACETAMINOPHEN 7.5-325 MG PO TABS: 1.0000 | ORAL_TABLET | Freq: Two times a day (BID) | ORAL | Status: DC | PRN

## 2016-01-27 ENCOUNTER — Other Ambulatory Visit: Payer: Self-pay | Admitting: Cardiovascular Disease

## 2016-01-27 NOTE — Telephone Encounter (Signed)
Rx(s) sent to pharmacy electronically.  

## 2016-03-03 ENCOUNTER — Other Ambulatory Visit: Payer: Self-pay

## 2016-03-03 MED ORDER — HYDROCODONE-ACETAMINOPHEN 7.5-325 MG PO TABS: 1.0000 | ORAL_TABLET | Freq: Two times a day (BID) | ORAL | Status: DC | PRN

## 2016-03-15 DIAGNOSIS — Z0289 Encounter for other administrative examinations: Secondary | ICD-10-CM

## 2016-04-06 ENCOUNTER — Other Ambulatory Visit: Payer: Self-pay | Admitting: Family Medicine

## 2016-04-08 ENCOUNTER — Other Ambulatory Visit: Payer: Self-pay

## 2016-04-08 MED ORDER — HYDROCODONE-ACETAMINOPHEN 7.5-325 MG PO TABS: 1.0000 | ORAL_TABLET | Freq: Two times a day (BID) | ORAL | 0 refills | Status: DC | PRN

## 2016-04-09 ENCOUNTER — Encounter (INDEPENDENT_AMBULATORY_CARE_PROVIDER_SITE_OTHER): Payer: Self-pay

## 2016-04-09 ENCOUNTER — Encounter: Payer: Self-pay | Admitting: Family Medicine

## 2016-04-09 ENCOUNTER — Ambulatory Visit (INDEPENDENT_AMBULATORY_CARE_PROVIDER_SITE_OTHER): Payer: Medicare HMO | Admitting: Family Medicine

## 2016-04-09 VITALS — BP 132/82 | HR 82 | Resp 16 | Ht 63.0 in | Wt 177.0 lb

## 2016-04-09 DIAGNOSIS — I10 Essential (primary) hypertension: Secondary | ICD-10-CM

## 2016-04-09 DIAGNOSIS — Z0000 Encounter for general adult medical examination without abnormal findings: Secondary | ICD-10-CM

## 2016-04-09 DIAGNOSIS — E785 Hyperlipidemia, unspecified: Secondary | ICD-10-CM

## 2016-04-09 NOTE — Assessment & Plan Note (Signed)

## 2016-04-09 NOTE — Patient Instructions (Addendum)
F/u in January, call if you need me before  Fasting lipid, cmp, mid September       Fall Prevention in the [facility] can cause injuries. They can happen to people of all ages. There are many things you can do to make your home safe and to help prevent falls.  WHAT CAN I DO ON THE OUTSIDE OF MY HOME?  Regularly fix the edges of walkways and driveways and fix any cracks.  Remove anything that might make you trip as you walk through a door, such as a raised step or threshold.  Trim any bushes or trees on the path to your home.  Use bright outdoor lighting.  Clear any walking paths of anything that might make someone trip, such as rocks or tools.  Regularly check to see if handrails are loose or broken. Make sure that both sides of any steps have handrails.  Any raised decks and porches should have guardrails on the edges.  Have any leaves, snow, or ice cleared regularly.  Use sand or salt on walking paths during winter.  Clean up any spills in your garage right away. This includes oil or grease spills. WHAT CAN I DO IN THE BATHROOM?   Use night lights.  Install grab bars by the toilet and in the tub and shower. Do not use towel bars as grab bars.  Use non-skid mats or decals in the tub or shower.  If you need to sit down in the shower, use a plastic, non-slip stool.  Keep the floor dry. Clean up any water that spills on the floor as soon as it happens.  Remove soap buildup in the tub or shower regularly.  Attach bath mats securely with double-sided non-slip rug tape.  Do not have throw rugs and other things on the floor that can make you trip. WHAT CAN I DO IN THE BEDROOM?  Use night lights.  Make sure that you have a light by your bed that is easy to reach.  Do not use any sheets or blankets that are too big for your bed. They should not hang down onto the floor.  Have a firm chair that has side arms. You can use this for support while you get dressed.  Do  not have throw rugs and other things on the floor that can make you trip. WHAT CAN I DO IN THE KITCHEN?  Clean up any spills right away.  Avoid walking on wet floors.  Keep items that you use a lot in easy-to-reach places.  If you need to reach something above you, use a strong step stool that has a grab bar.  Keep electrical cords out of the way.  Do not use floor polish or wax that makes floors slippery. If you must use wax, use non-skid floor wax.  Do not have throw rugs and other things on the floor that can make you trip. WHAT CAN I DO WITH MY STAIRS?  Do not leave any items on the stairs.  Make sure that there are handrails on both sides of the stairs and use them. Fix handrails that are broken or loose. Make sure that handrails are as long as the stairways.  Check any carpeting to make sure that it is firmly attached to the stairs. Fix any carpet that is loose or worn.  Avoid having throw rugs at the top or bottom of the stairs. If you do have throw rugs, attach them to the floor with carpet tape.  Make sure that you have a light switch at the top of the stairs and the bottom of the stairs. If you do not have them, ask someone to add them for you. WHAT ELSE CAN I DO TO HELP PREVENT FALLS?  Wear shoes that:  Do not have high heels.  Have rubber bottoms.  Are comfortable and fit you well.  Are closed at the toe. Do not wear sandals.  If you use a stepladder:  Make sure that it is fully opened. Do not climb a closed stepladder.  Make sure that both sides of the stepladder are locked into place.  Ask someone to hold it for you, if possible.  Clearly mark and make sure that you can see:  Any grab bars or handrails.  First and last steps.  Where the edge of each step is.  Use tools that help you move around (mobility aids) if they are needed. These include:  Canes.  Walkers.  Scooters.  Crutches.  Turn on the lights when you go into a dark area.  Replace any light bulbs as soon as they burn out.  Set up your furniture so you have a clear path. Avoid moving your furniture around.  If any of your floors are uneven, fix them.  If there are any pets around you, be aware of where they are.  Review your medicines with your doctor. Some medicines can make you feel dizzy. This can increase your chance of falling. Ask your doctor what other things that you can do to help prevent falls.   This information is not intended to replace advice given to you by your health care provider. Make sure you discuss any questions you have with your health care provider.   Document Released: 06/19/2009 Document Revised: 01/07/2015 Document Reviewed: 09/27/2014 Elsevier Interactive Patient Education [employer].

## 2016-04-09 NOTE — Progress Notes (Signed)
Preventive Screening-Counseling & Management   Patient present here today for a Medicare annual wellness visit.   Current Problems (verified)   Medications Prior to Visit Allergies (verified)   PAST HISTORY  Family History (updated)   Social History widowed, 4 daughters, lives alone and former smoker who quit in 1987   Risk Factors  Current exercise habits: Walks as able and does climb a lot of steps  Dietary issues discussed: Heart healthy, limits fried fatty foods and carbs    Cardiac risk factors: CAD, HTN, hyperlipidemia   Depression Screen  (Note: if answer to either of the following is "Yes", a more complete depression screening is indicated)   Over the past two weeks, have you felt down, depressed or hopeless? No  Over the past two weeks, have you felt little interest or pleasure in doing things? No  Have you lost interest or pleasure in daily life? No  Do you often feel hopeless? No  Do you cry easily over simple problems? Sometimes, she's very sensitive  Activities of Daily Living  In your present state of health, do you have any difficulty performing the following activities?  Driving?: No Managing money?: No Feeding yourself?:No Getting from bed to chair?:No Climbing a flight of stairs?:yes, right knee stiffness and pain Preparing food and eating?:No Bathing or showering?:No Getting dressed?:No Getting to the toilet?:No Using the toilet?:No Moving around from place to place?: yes  Fall Risk Assessment In the past year have you fallen or had a near fall?:one Are you currently taking any medications that make you dizzy?:No   Hearing Difficulties: No Do you often ask people to speak up or repeat themselves?:No Do you experience ringing or noises in your ears?:No Do you have difficulty understanding soft or whispered voices?:No  Cognitive Testing  Alert? Yes Normal Appearance?Yes  Oriented to person? Yes Place? Yes  Time? Yes  Displays appropriate  judgment?Yes  Can read the correct time from a watch face? yes Are you having problems remembering things? Maybe a little  Advanced Directives have been discussed with the patient?Yes, has received information , full code   List the Names of Other Physician/Practitioners you currently use: updated   Indicate any recent Medical Services you may have received from other than [facility] providers in the past year (date may be approximate).     Medicare Attestation  I have personally reviewed:  The patient's medical and social history  Their use of alcohol, tobacco or illicit drugs  Their current medications and supplements  The patient's functional ability including ADLs,fall risks, home safety risks, cognitive, and hearing and visual impairment  Diet and physical activities  Evidence for depression or mood disorders  The patient's weight, height, BMI, and visual acuity have been recorded in the chart. I have made referrals, counseling, and provided education to the patient based on review of the above and I have provided the patient with a written personalized care plan for preventive services.    Physical Exam BP 132/82   Pulse 82   Resp 16   Ht 5\' 3"  (1.6 m)   Wt 177 lb (80.3 kg)   SpO2 98%   BMI 31.35 kg/m    Assessment & Plan:  Medicare annual wellness visit, subsequent Annual exam as documented. Counseling done  re healthy lifestyle involving commitment to 150 minutes exercise per week, heart healthy diet, and attaining healthy weight.The importance of adequate sleep also discussed. Regular seat belt use and home safety, is also discussed. Changes in health  habits are decided on by the patient with goals and time frames  set for achieving them. Immunization and cancer screening needs are specifically addressed at this visit.

## 2016-04-16 ENCOUNTER — Emergency Department (HOSPITAL_COMMUNITY)
Admission: EM | Admit: 2016-04-16 | Discharge: 2016-04-16 | Disposition: A | Discharge status: Home / Self Care | Payer: Medicare HMO | Attending: Emergency Medicine | Admitting: Emergency Medicine

## 2016-04-16 ENCOUNTER — Encounter (HOSPITAL_COMMUNITY): Payer: Self-pay | Admitting: Emergency Medicine

## 2016-04-16 DIAGNOSIS — Z7982 Long term (current) use of aspirin: Secondary | ICD-10-CM | POA: Diagnosis not present

## 2016-04-16 DIAGNOSIS — I2510 Atherosclerotic heart disease of native coronary artery without angina pectoris: Secondary | ICD-10-CM | POA: Diagnosis not present

## 2016-04-16 DIAGNOSIS — Z79899 Other long term (current) drug therapy: Secondary | ICD-10-CM | POA: Diagnosis not present

## 2016-04-16 DIAGNOSIS — T63441A Toxic effect of venom of bees, accidental (unintentional), initial encounter: Secondary | ICD-10-CM | POA: Diagnosis not present

## 2016-04-16 DIAGNOSIS — S0086XA Insect bite (nonvenomous) of other part of head, initial encounter: Secondary | ICD-10-CM | POA: Insufficient documentation

## 2016-04-16 DIAGNOSIS — Y939 Activity, unspecified: Secondary | ICD-10-CM | POA: Insufficient documentation

## 2016-04-16 DIAGNOSIS — W57XXXA Bitten or stung by nonvenomous insect and other nonvenomous arthropods, initial encounter: Secondary | ICD-10-CM | POA: Diagnosis not present

## 2016-04-16 DIAGNOSIS — Y929 Unspecified place or not applicable: Secondary | ICD-10-CM | POA: Insufficient documentation

## 2016-04-16 DIAGNOSIS — I10 Essential (primary) hypertension: Secondary | ICD-10-CM | POA: Diagnosis not present

## 2016-04-16 DIAGNOSIS — T7840XA Allergy, unspecified, initial encounter: Secondary | ICD-10-CM | POA: Diagnosis not present

## 2016-04-16 DIAGNOSIS — Z87891 Personal history of nicotine dependence: Secondary | ICD-10-CM | POA: Diagnosis not present

## 2016-04-16 DIAGNOSIS — Y999 Unspecified external cause status: Secondary | ICD-10-CM | POA: Diagnosis not present

## 2016-04-16 DIAGNOSIS — S60561A Insect bite (nonvenomous) of right hand, initial encounter: Secondary | ICD-10-CM | POA: Diagnosis present

## 2016-04-16 MED ORDER — DIPHENHYDRAMINE HCL 50 MG/ML IJ SOLN
25.0000 mg | Freq: Once | INTRAMUSCULAR | Status: AC
  Administered 2016-04-16: 25 mg via INTRAVENOUS
  Filled 2016-04-16: qty 1

## 2016-04-16 MED ORDER — ACETAMINOPHEN 325 MG PO TABS
650.0000 mg | ORAL_TABLET | Freq: Once | ORAL | Status: AC
  Administered 2016-04-16: 650 mg via ORAL
  Filled 2016-04-16: qty 2

## 2016-04-16 MED ORDER — METHYLPREDNISOLONE SODIUM SUCC 125 MG IJ SOLR
125.0000 mg | Freq: Once | INTRAMUSCULAR | Status: AC
  Administered 2016-04-16: 125 mg via INTRAVENOUS
  Filled 2016-04-16: qty 2

## 2016-04-16 MED ORDER — FAMOTIDINE IN NACL 20-0.9 MG/50ML-% IV SOLN
20.0000 mg | Freq: Once | INTRAVENOUS | Status: AC
  Administered 2016-04-16: 20 mg via INTRAVENOUS
  Filled 2016-04-16: qty 50

## 2016-04-16 NOTE — ED Triage Notes (Signed)
Pt reports bee sting on right hand and left side of face.  Pt has swelling around eyes and feels tightness in throat.  Airway upon assessment is open and no swelling noted to throat or tongue.  PT speaking in complete sentences, tearful at this time.

## 2016-04-16 NOTE — Discharge Instructions (Signed)
It was our pleasure to provide your ER care today - we hope that you feel better.  Take tylenol/advil as need for pain.  Take benadryl as need - no driving for the next 4 hours, or if/when taking benadryl.   Return to ER if worse, new symptoms, throat swelling/closing, trouble breathing, other concern.

## 2016-04-16 NOTE — ED Provider Notes (Signed)
[place] DEPT Provider Note   CSN: [id number] Arrival date & time: 04/16/16  1539  First Provider Contact:  First MD Initiated Contact with Patient 04/16/16 1606        History   Chief Complaint Chief Complaint  Patient presents with  . Insect Bite    HPI [name] is a 82 y.o. female.  Patient c/o bee sting to right hand and left face just pta today. Unsure what type of bee. States hx allergic rxn to bee sting with tongue swelling then.  States today, localized pain to areas of sting, and states throat felt funny. No sob. No tongue swelling. No faintness/syncope. Patient denies taking any medication since sting. Prior to sting, felt well, at baseline. No fevers. No chest pain.    The history is provided by the patient.    Past Medical History:  Diagnosis Date  . Anemia   . Blood clot associated with vein wall inflammation    hx of in right leg  . CAD (coronary artery disease)   . Carpal tunnel syndrome on left    Left Hand pain with numbness, new onset   . Chronic back pain   . Chronic back pain    buldging disc  . Constipation   . Diarrhea   . Difficult intubation    small airway  . DJD (degenerative joint disease)    Severe  . Gastric ulcer    hx of   . GERD (gastroesophageal reflux disease)    takes Omeprazole daily  . Headache(784.0)    occasionally  . Hyperlipidemia    takes Lipitor nightly  . Hypertension    takes Amlodipine and Metoprolol daily  . Nocturia   . Obesity   . Pneumonia    hx of-in 2011  . Shortness of breath    with exertion  . Sleep apnea    sleep study done 36yrs ago  . Thyroid mass    right    Patient Active Problem List   Diagnosis Date Noted  . CAD in native artery   . Medicare annual wellness visit, subsequent 03/17/2015  . Leg pain, right 06/25/2014  . Right knee pain 04/17/2013  . Coronary vasospasm ([place]) 02/07/2013  . Depression 12/19/2012  . Hypothyroid 08/14/2012  . Prediabetes 10/10/2011  . CARPAL  TUNNEL SYNDROME, BILATERAL 02/19/2010  . GERD 09/15/2009  . Coronary atherosclerosis 10/01/2008  . DYSPEPSIA 07/07/2008  . BACK PAIN WITH RADICULOPATHY 07/03/2008  . Hyperlipidemia 11/24/2007  . Essential hypertension 11/24/2007    Past Surgical History:  Procedure Laterality Date  . ABDOMINAL HYSTERECTOMY    . APPENDECTOMY  1987  . Arthoscopy left knee  1992  . Arthroscopy left shoulder  1999  . Back  Surgery  lumbar  1989 /1999   x 2  . BACK SURGERY    . CARDIAC CATHETERIZATION  05/2010  . CARDIAC CATHETERIZATION N/A 09/30/2015   Procedure: Left Heart Cath and Coronary Angiography;  Surgeon: Troy Sine, MD;  Location: [place] [facility];  Service: Cardiovascular;  Laterality: N/A;  . Carpal tunnel release right     x 2  . CHOLECYSTECTOMY    . COLONOSCOPY    . ESOPHAGOGASTRODUODENOSCOPY    . LUMBAR FUSION  March 19, 2014  . NM MYOCAR PERF WALL MOTION  11/12/2008   no ischemia  . PARTIAL HYSTERECTOMY  1972  . ROTATOR CUFF REPAIR     right  . SPINE SURGERY    . THYROIDECTOMY  11/17/2011  Procedure: THYROIDECTOMY;  Surgeon: Ascencion Dike, MD;  Location: [facility];  Service: ENT;  Laterality: Right;  WITH FROZEN SECTION  . TONSILLECTOMY      OB History    No data available       Home Medications    Prior to Admission medications   Medication Sig Start Date End Date Taking? Authorizing Provider  amLODipine (NORVASC) 10 MG tablet Take 1 tablet (10 mg total) by mouth daily. 12/04/15   Fayrene Helper, MD  Ascorbic Acid (VITAMIN C) 500 MG tablet Take 500 mg by mouth daily.     Historical Provider, MD  aspirin EC 81 MG tablet Take 81 mg by mouth daily.    Historical Provider, MD  atorvastatin (LIPITOR) 20 MG tablet Take 1 tablet (20 mg total) by mouth at bedtime. 05/22/15   Fayrene Helper, MD  calcium carbonate (OS-CAL - DOSED IN MG OF ELEMENTAL CALCIUM) 1250 MG tablet Take 1 tablet by mouth daily.    Historical Provider, MD  Cyanocobalamin (B-12) 1000 MCG CAPS Take 1  capsule by mouth daily.    Historical Provider, MD  diclofenac sodium (VOLTAREN) 1 % GEL Apply 1 application topically 4 (four) times daily. Patient taking differently: Apply 2 g topically 2 (two) times daily as needed (for back pain).  02/28/12   Fayrene Helper, MD  EPINEPHrine (EPIPEN 2-PAK) 0.3 mg/0.3 mL IJ SOAJ injection INJECT AS DIRECTED FOR SEVERE ALLERGIC REACTIONS 12/23/15   Fayrene Helper, MD  ferrous sulfate 325 (65 FE) MG tablet Take 325 mg by mouth daily with breakfast.     Historical Provider, MD  FLUoxetine (PROZAC) 20 MG tablet take 1 and 1/2 tablet by mouth once daily 04/07/16   Fayrene Helper, MD  fluticasone (FLONASE) 50 MCG/ACT nasal spray Place 1 spray into both nostrils daily. 05/22/15   Fayrene Helper, MD  hydrochlorothiazide (MICROZIDE) 12.5 MG capsule take 1 capsule by mouth once daily 10/21/14   Fayrene Helper, MD  HYDROcodone-acetaminophen [facility]) 7.5-325 MG tablet Take 1 tablet by mouth 2 (two) times daily as needed for moderate pain. 04/08/16   Fayrene Helper, MD  isosorbide mononitrate (IMDUR) 30 MG 24 hr tablet take 1 tablet by mouth once daily 01/27/16   Sanda Klein, MD  levothyroxine (SYNTHROID, LEVOTHROID) 25 MCG tablet take 1 tablet by mouth every morning 04/07/16   Fayrene Helper, MD  Multiple Vitamin (MULITIVITAMIN WITH MINERALS) TABS Take 1 tablet by mouth daily.    Historical Provider, MD  nitroGLYCERIN (NITROLINGUAL) 0.4 MG/SPRAY spray Place 1 spray under the tongue every 5 (five) minutes x 3 doses as needed for chest pain. 08/14/14   Mihai Croitoru, MD  omeprazole (PRILOSEC) 40 MG capsule Take 1 capsule (40 mg total) by mouth daily. 11/13/15   Fayrene Helper, MD  potassium chloride (K-DUR) 10 MEQ tablet take 1 tablet by mouth twice a day 08/26/15   Fayrene Helper, MD  pyridoxine (B-6) 200 MG tablet Take 200 mg by mouth daily.     Historical Provider, MD  ZOVIRAX 5 % APPLY TOPICALLY EVERY 3 HOURS. 01/24/13   Fayrene Helper, MD     Family History Family History  Problem Relation Age of Onset  . Anuerysm Mother     died at 71  . Stroke Father     41 at death   . Anesthesia problems Neg Hx   . Hypotension Neg Hx   . Malignant hyperthermia Neg Hx   . Pseudochol  deficiency Neg Hx     Social History Social History  Substance Use Topics  . Smoking status: Former Smoker    Types: Cigarettes    Quit date: 09/06/1984  . Smokeless tobacco: Never Used  . Alcohol use No     Comment: Casual     Allergies   Ace inhibitors; Bupropion hcl; Codeine; Penicillins; Shellfish allergy; Angiotensin receptor blockers; and Bee venom   Review of Systems Review of Systems  Constitutional: Negative for fever.  HENT: Negative for trouble swallowing.   Eyes: Negative for redness.  Respiratory: Negative for shortness of breath.   Cardiovascular: Negative for chest pain.  Gastrointestinal: Negative for vomiting.  Genitourinary: Negative for flank pain.  Musculoskeletal: Negative for back pain.  Skin: Negative for rash.  Neurological: Negative for syncope.  Hematological: Does not bruise/bleed easily.  Psychiatric/Behavioral: Negative for confusion.     Physical Exam Updated Vital Signs BP 132/80 (BP Location: Left Arm)   Pulse 97   Temp 98.7 F (37.1 C) (Oral)   Resp 14   Ht 5\' 3"  (1.6 m)   Wt 79.4 kg   SpO2 100%   BMI 31.00 kg/m   Physical Exam  Constitutional: She appears well-developed and well-nourished. No distress.  HENT:  Nose: Nose normal.  Mouth/Throat: Oropharynx is clear and moist.  Eyes: Conjunctivae are normal. No scleral icterus.  Neck: Neck supple. No tracheal deviation present.  Cardiovascular: Normal rate, regular rhythm, normal heart sounds and intact distal pulses.   Pulmonary/Chest: Effort normal and breath sounds normal. No respiratory distress.  Abdominal: Soft. Normal appearance. She exhibits no distension. There is no tenderness.  Musculoskeletal: She exhibits no edema.  Bee  sting to dorsum right hand and left face just anterior to ear. Localized mild erythema and swelling c/w sting. No fb/stinger.   Neurological: She is alert.  Skin: Skin is warm and dry. Capillary refill takes less than 2 seconds. No rash noted. She is not diaphoretic.  See MS exam, otherwise, no rash or hives.   Psychiatric: She has a normal mood and affect.  Nursing note and vitals reviewed.    ED Treatments / Results  Labs (all labs ordered are listed, but only abnormal results are displayed) Labs Reviewed - No data to display  EKG  EKG Interpretation None       Radiology No results found.  Procedures Procedures (including critical care time)  Medications Ordered in ED Medications  famotidine (PEPCID) IVPB 20 mg premix (not administered)  methylPREDNISolone sodium succinate (SOLU-MEDROL) 125 mg/2 mL injection 125 mg (not administered)  diphenhydrAMINE (BENADRYL) injection 25 mg (not administered)     Initial Impression / Assessment and Plan / ED Course  I have reviewed the triage vital signs and the nursing notes.  Pertinent labs & imaging results that were available during my care of the patient were reviewed by me and considered in my medical decision making (see chart for details).  Clinical Course    No meds pta.  Hx significant allergic rxn to bees. Solumedrol iv, pepcid iv, benadryl iv. Monitor vitals.   Reviewed nursing notes and prior charts for additional history.   Tylenol po.  Recheck, symptoms improved. No throat pain/swelling or sob. No faintness.  Patient currently appears stable for d/c.    Final Clinical Impressions(s) / ED Diagnoses   Final diagnoses:  None    New Prescriptions New Prescriptions   No medications on file     Lajean Saver, MD 04/16/16 [phone]

## 2016-05-07 ENCOUNTER — Other Ambulatory Visit: Payer: Self-pay

## 2016-05-07 MED ORDER — HYDROCODONE-ACETAMINOPHEN 7.5-325 MG PO TABS: 1.0000 | ORAL_TABLET | Freq: Two times a day (BID) | ORAL | 0 refills | Status: DC | PRN

## 2016-06-11 ENCOUNTER — Other Ambulatory Visit: Payer: Self-pay

## 2016-06-11 MED ORDER — HYDROCODONE-ACETAMINOPHEN 7.5-325 MG PO TABS: 1.0000 | ORAL_TABLET | Freq: Two times a day (BID) | ORAL | 0 refills | Status: DC | PRN

## 2016-07-07 ENCOUNTER — Other Ambulatory Visit: Payer: Self-pay | Admitting: Family Medicine

## 2016-07-09 ENCOUNTER — Other Ambulatory Visit: Payer: Self-pay

## 2016-07-09 MED ORDER — HYDROCODONE-ACETAMINOPHEN 7.5-325 MG PO TABS: 1.0000 | ORAL_TABLET | Freq: Two times a day (BID) | ORAL | 0 refills | Status: DC | PRN

## 2016-08-12 ENCOUNTER — Other Ambulatory Visit: Payer: Self-pay

## 2016-08-12 MED ORDER — HYDROCODONE-ACETAMINOPHEN 7.5-325 MG PO TABS: 1.0000 | ORAL_TABLET | Freq: Two times a day (BID) | ORAL | 0 refills | Status: DC | PRN

## 2016-09-02 DIAGNOSIS — K219 Gastro-esophageal reflux disease without esophagitis: Secondary | ICD-10-CM | POA: Diagnosis not present

## 2016-09-02 DIAGNOSIS — I10 Essential (primary) hypertension: Secondary | ICD-10-CM | POA: Diagnosis not present

## 2016-09-02 DIAGNOSIS — E785 Hyperlipidemia, unspecified: Secondary | ICD-10-CM | POA: Diagnosis not present

## 2016-09-02 DIAGNOSIS — E039 Hypothyroidism, unspecified: Secondary | ICD-10-CM | POA: Diagnosis not present

## 2016-09-02 DIAGNOSIS — R69 Illness, unspecified: Secondary | ICD-10-CM | POA: Diagnosis not present

## 2016-09-02 DIAGNOSIS — Z6831 Body mass index (BMI) 31.0-31.9, adult: Secondary | ICD-10-CM | POA: Diagnosis not present

## 2016-09-02 DIAGNOSIS — Z0000 Encounter for general adult medical examination without abnormal findings: Secondary | ICD-10-CM | POA: Diagnosis not present

## 2016-09-10 ENCOUNTER — Other Ambulatory Visit: Payer: Self-pay

## 2016-09-10 MED ORDER — HYDROCODONE-ACETAMINOPHEN 7.5-325 MG PO TABS: 1.0000 | ORAL_TABLET | Freq: Two times a day (BID) | ORAL | 0 refills | Status: DC | PRN

## 2016-09-16 ENCOUNTER — Other Ambulatory Visit: Payer: Self-pay | Admitting: Family Medicine

## 2016-09-16 ENCOUNTER — Telehealth: Payer: Self-pay

## 2016-09-16 DIAGNOSIS — Z1231 Encounter for screening mammogram for malignant neoplasm of breast: Secondary | ICD-10-CM

## 2016-09-16 NOTE — Telephone Encounter (Signed)
Advised patient to diligently take Flonase and advised standing order of Loratadine otc

## 2016-09-21 DIAGNOSIS — I10 Essential (primary) hypertension: Secondary | ICD-10-CM | POA: Diagnosis not present

## 2016-09-21 DIAGNOSIS — E785 Hyperlipidemia, unspecified: Secondary | ICD-10-CM | POA: Diagnosis not present

## 2016-09-22 LAB — COMPREHENSIVE METABOLIC PANEL
ALT: 12 U/L (ref 6–29)
AST: 21 U/L (ref 10–35)
Albumin: 3.9 g/dL (ref 3.6–5.1)
Alkaline Phosphatase: 75 U/L (ref 33–130)
BUN: 19 mg/dL (ref 7–25)
CO2: 28 mmol/L (ref 20–31)
Calcium: 9.7 mg/dL (ref 8.6–10.4)
Chloride: 104 mmol/L (ref 98–110)
Creat: 1 mg/dL — ABNORMAL HIGH (ref 0.60–0.88)
Glucose, Bld: 94 mg/dL (ref 65–99)
Potassium: 4.2 mmol/L (ref 3.5–5.3)
Sodium: 140 mmol/L (ref 135–146)
Total Bilirubin: 0.4 mg/dL (ref 0.2–1.2)
Total Protein: 6.8 g/dL (ref 6.1–8.1)

## 2016-09-22 LAB — LIPID PANEL
Cholesterol: 187 mg/dL (ref <200)
HDL: 45 mg/dL — ABNORMAL LOW (ref >50)
LDL Cholesterol: 116 mg/dL — ABNORMAL HIGH (ref <100)
Total CHOL/HDL Ratio: 4.2 Ratio (ref <5.0)
Triglycerides: 131 mg/dL (ref <150)
VLDL: 26 mg/dL (ref <30)

## 2016-09-28 ENCOUNTER — Ambulatory Visit (INDEPENDENT_AMBULATORY_CARE_PROVIDER_SITE_OTHER): Payer: Medicare HMO | Admitting: Family Medicine

## 2016-09-28 VITALS — BP 130/80 | HR 72 | Resp 14 | Ht 62.0 in | Wt 175.0 lb

## 2016-09-28 DIAGNOSIS — G8929 Other chronic pain: Secondary | ICD-10-CM

## 2016-09-28 DIAGNOSIS — E785 Hyperlipidemia, unspecified: Secondary | ICD-10-CM

## 2016-09-28 DIAGNOSIS — M25562 Pain in left knee: Secondary | ICD-10-CM

## 2016-09-28 DIAGNOSIS — Z23 Encounter for immunization: Secondary | ICD-10-CM | POA: Diagnosis not present

## 2016-09-28 DIAGNOSIS — M25561 Pain in right knee: Secondary | ICD-10-CM

## 2016-09-28 DIAGNOSIS — F3289 Other specified depressive episodes: Secondary | ICD-10-CM

## 2016-09-28 DIAGNOSIS — I10 Essential (primary) hypertension: Secondary | ICD-10-CM

## 2016-09-28 MED ORDER — HYDROCODONE-ACETAMINOPHEN 7.5-325 MG PO TABS: 1.0000 | ORAL_TABLET | Freq: Every day | ORAL | 0 refills | Status: DC | PRN

## 2016-09-28 NOTE — Patient Instructions (Addendum)
F/u with MMSE in 3 month    Reduce pain medication I  To ONE daily and take ES tylenol one every day also  I will refer you to ortho as we discussed  Flu vaccine today  Thanks for choosing [facility], we consider it a privelige to serve you.  It is important that you exercise regularly at least 30 minutes 5 times a week. If you develop chest pain, have severe difficulty breathing, or feel very tired, stop exercising immediately and seek medical attention

## 2016-09-28 NOTE — Assessment & Plan Note (Signed)
Right knee pain , rated between 6 to 8, with swelling, refer ortho

## 2016-09-28 NOTE — Progress Notes (Signed)
Ahmiah Bidgood     MRN: [id number]      DOB: 09-28-1933   HPI Ms. Brandstetter is here for follow up and re-evaluation of chronic medical conditions, medication management and review of any available recent lab and radiology data.  Preventive health is updated, specifically  Cancer screening and Immunization.   Questions or concerns regarding consultations or procedures which the PT has had in the interim are  addressed. The PT denies any adverse reactions to current medications since the last visit.  C/o increased knee pain and stiffness, wants help as mobility is limited. Here to review and discuss chronic pain management as well as to review new policy .   ROS Denies recent fever or chills. Denies sinus pressure, nasal congestion, ear pain or sore throat. Denies chest congestion, productive cough or wheezing. Denies chest pains, palpitations and leg swelling Denies abdominal pain, nausea, vomiting,diarrhea or constipation.   Denies dysuria, frequency, hesitancy or incontinence. C/o increased right knee pain, and stiffness  Denies headaches, seizures, numbness, or tingling. Denies depression, anxiety or insomnia. Denies skin break down or rash.   PE BP 130/80   Pulse 72   Resp 14   Ht 5\' 2"  (1.575 m)   Wt 175 lb (79.4 kg)   SpO2 98%   BMI 32.01 kg/m    Patient alert and oriented and in no cardiopulmonary distress.  HEENT: No facial asymmetry, EOMI,   oropharynx pink and moist.  Neck supple no JVD, no mass.  Chest: Clear to auscultation bilaterally.  CVS: S1, S2 no murmurs, no S3.Regular rate.  ABD: Soft non tender.   Ext: No edema  MS: decreased  ROM spine,  and right knee.  Skin: Intact, no ulcerations or rash noted.  Psych: Good eye contact, normal affect. Memory intact not anxious or depressed appearing.  CNS: CN 2-12 intact, power,  normal throughout.no focal deficits noted.   Assessment & Plan  Right knee pain Right knee pain , rated between 6 to 8,  with swelling, refer ortho  Encounter for chronic pain management Patient's level of function and pain control is reviewed and regime reviewed and adjusted accordingly. Pain contract is reviewed and signed. New prescribing policy is instituted. All questions are answered, pt verbalizes understanding  Need for influenza vaccination After obtaining informed consent, the vaccine is  administered by LPN.   Hyperlipidemia Hyperlipidemia:Low fat diet discussed and encouraged.   Lipid Panel  Lab Results  Component Value Date   CHOL 187 09/21/2016   HDL 45 (L) 09/21/2016   LDLCALC 116 (H) 09/21/2016   TRIG 131 09/21/2016   CHOLHDL 4.2 09/21/2016   Uncontrolled, needs to reduce fried and fatty food intake, and increase exercise    Essential hypertension Controlled, no change in medication DASH diet and commitment to daily physical activity for a minimum of 30 minutes discussed and encouraged, as a part of hypertension management. The importance of attaining a healthy weight is also discussed.  BP/Weight 09/28/2016 04/16/2016 04/09/2016 12/04/2015 10/10/2015 09/30/2015 [id number]  Systolic BP [id number] Q000111Q [id number] Q000111Q [id number]  Diastolic BP 80 88 82 84 87 85 64  Wt. (Lbs) 175 175 177 177 176 174 171.5  BMI 32.01 31 31.35 31.36 31.18 31.82 31.36       Depression Controlled, no change in medication   BACK PAIN WITH RADICULOPATHY S/p 3 back surgeries, still c/o with chronic pain and stiffness ,one hydrocodone affords adequate control and allows her to function. I encouraged her to add once  daily tylenol and to use topical meds also

## 2016-10-11 ENCOUNTER — Other Ambulatory Visit: Payer: Self-pay | Admitting: Family Medicine

## 2016-10-11 ENCOUNTER — Telehealth: Payer: Self-pay | Admitting: Family Medicine

## 2016-10-11 ENCOUNTER — Encounter: Payer: Self-pay | Admitting: Family Medicine

## 2016-10-11 NOTE — Telephone Encounter (Signed)
Pt with high LDL, needs statin therapy, was on lipitor 20 mg in past from what I see, I recommend lipitor 10 mg daily and low fat diet, I have entered, pls verify this is accurate if not let me know  Lab order will NOT be due before her next visit Thanks

## 2016-10-11 NOTE — Assessment & Plan Note (Signed)
S/p 3 back surgeries, still c/o with chronic pain and stiffness ,one hydrocodone affords adequate control and allows her to function. I encouraged her to add once daily tylenol and to use topical meds also

## 2016-10-11 NOTE — Assessment & Plan Note (Signed)
After obtaining informed consent, the vaccine is  administered by LPN.  

## 2016-10-11 NOTE — Assessment & Plan Note (Signed)
Controlled, no change in medication  

## 2016-10-11 NOTE — Assessment & Plan Note (Signed)
Patient's level of function and pain control is reviewed and regime reviewed and adjusted accordingly. Pain contract is reviewed and signed. New prescribing policy is instituted. All questions are answered, pt verbalizes understanding

## 2016-10-11 NOTE — Assessment & Plan Note (Signed)
Hyperlipidemia:Low fat diet discussed and encouraged.   Lipid Panel  Lab Results  Component Value Date   CHOL 187 09/21/2016   HDL 45 (L) 09/21/2016   LDLCALC 116 (H) 09/21/2016   TRIG 131 09/21/2016   CHOLHDL 4.2 09/21/2016   Uncontrolled, needs to reduce fried and fatty food intake, and increase exercise

## 2016-10-11 NOTE — Assessment & Plan Note (Signed)
Controlled, no change in medication DASH diet and commitment to daily physical activity for a minimum of 30 minutes discussed and encouraged, as a part of hypertension management. The importance of attaining a healthy weight is also discussed.  BP/Weight 09/28/2016 04/16/2016 04/09/2016 12/04/2015 10/10/2015 09/30/2015 [id number]  Systolic BP [id number] Q000111Q Q000111Q [id number] Q000111Q [id number]  Diastolic BP 80 88 82 84 87 85 64  Wt. (Lbs) 175 175 177 177 176 174 171.5  BMI 32.01 31 31.35 31.36 31.18 31.82 31.36

## 2016-10-12 ENCOUNTER — Encounter: Payer: Self-pay | Admitting: Cardiovascular Disease

## 2016-10-12 ENCOUNTER — Ambulatory Visit: Payer: Medicare HMO | Admitting: Cardiovascular Disease

## 2016-10-12 ENCOUNTER — Ambulatory Visit (INDEPENDENT_AMBULATORY_CARE_PROVIDER_SITE_OTHER): Payer: Medicare HMO | Admitting: Cardiovascular Disease

## 2016-10-12 VITALS — BP 124/76 | HR 83 | Ht 62.0 in | Wt 169.4 lb

## 2016-10-12 DIAGNOSIS — E7800 Pure hypercholesterolemia, unspecified: Secondary | ICD-10-CM | POA: Diagnosis not present

## 2016-10-12 DIAGNOSIS — I201 Angina pectoris with documented spasm: Secondary | ICD-10-CM

## 2016-10-12 DIAGNOSIS — I2510 Atherosclerotic heart disease of native coronary artery without angina pectoris: Secondary | ICD-10-CM

## 2016-10-12 DIAGNOSIS — E663 Overweight: Secondary | ICD-10-CM | POA: Insufficient documentation

## 2016-10-12 DIAGNOSIS — I10 Essential (primary) hypertension: Secondary | ICD-10-CM | POA: Diagnosis not present

## 2016-10-12 DIAGNOSIS — E66811 Obesity, class 1: Secondary | ICD-10-CM

## 2016-10-12 DIAGNOSIS — E669 Obesity, unspecified: Secondary | ICD-10-CM

## 2016-10-12 MED ORDER — ATORVASTATIN CALCIUM 20 MG PO TABS: 20.0000 mg | ORAL_TABLET | Freq: Every day | ORAL | 11 refills | Status: DC

## 2016-10-12 NOTE — Patient Instructions (Signed)
Dr Sallyanne Kuster has recommended making the following medication changes: 1. INCREASE Atorvastatin to 20 mg daily  Your physician recommends that you schedule a follow-up appointment in 1 year. You will receive a reminder letter in the mail two months in advance. If you don't receive a letter, please call our office to schedule the follow-up appointment.  If you need a refill on your cardiac medications before your next appointment, please call your pharmacy.

## 2016-10-12 NOTE — Progress Notes (Signed)
Patient ID: [name], female   DOB: 06-Oct-1933, 83 y.o.   MRN: [id number]     Cardiology Office Note    Date:  10/12/2016   ID:  [name], DOB [birth year], MRN [id number]  PCP:  Tula Nakayama, MD  Cardiologist:   Sanda Klein, MD   Chief Complaint  Patient presents with  . Follow-up    pt reports no complaints    History of Present Illness:  [name] is a 83 y.o. female with intermittent chest discomfort suggestive of angina pectoris who returns in follow-up one year after undergoing coronary angiography on September 25, 2015. The study did not show any evidence of significant coronary stenoses. She has scattered 20% lesions in all 3 coronary arteries. Also of note, left ventricular systolic function was normal and left ventricular end-diastolic pressure was low at 8 mmHg. The cardiac catheterization was initially attempted via the left radial artery, unsuccessfully, then performed via the right femoral artery.   The possibility of coronary spasm has been entertained for years and it is hard to prove or disprove. She has numerous vascular risk factors including hyperlipidemia (on atorvastatin), borderline diabetes mellitus, obesity and hypertension. Her blood pressure is treated with maximum dose of amlodipine. She quit smoking 30 years ago.Marland Kitchen  Has generally had a good year, although she does not have as much energy as she did in the past. Denies angina or dyspnea either at rest with exertion and has not experienced palpitations, syncope, edema, intermittent claudication or focal neurological complaints. She has been tried watch her diet and has lost 6 pounds since last year. She has problems with swelling and discomfort in her right knee. She has been told that she needs a knee replacement but wants to put this off as long as she possibly can. She is worried about the risks of general anesthesia.  Her atorvastatin doses lower than it was at year ago. She does not  recall having side effects or knowingly having it changed. I think this may have been an inadvertent change. Her LDL cholesterol substantially higher than 116, whereas before it had generally been in the desirable range of 70 or below.  Past Medical History:  Diagnosis Date  . Anemia   . Blood clot associated with vein wall inflammation    hx of in right leg  . CAD (coronary artery disease)   . Carpal tunnel syndrome on left    Left Hand pain with numbness, new onset   . Chronic back pain   . Chronic back pain    buldging disc  . Constipation   . Diarrhea   . Difficult intubation    small airway  . DJD (degenerative joint disease)    Severe  . Gastric ulcer    hx of   . GERD (gastroesophageal reflux disease)    takes Omeprazole daily  . Headache(784.0)    occasionally  . Hyperlipidemia    takes Lipitor nightly  . Hypertension    takes Amlodipine and Metoprolol daily  . Nocturia   . Obesity   . Pneumonia    hx of-in 2011  . Shortness of breath    with exertion  . Sleep apnea    sleep study done 18yrs ago  . Thyroid mass    right    Past Surgical History:  Procedure Laterality Date  . ABDOMINAL HYSTERECTOMY    . APPENDECTOMY  1987  . Arthoscopy left knee  1992  . Arthroscopy left shoulder  1999  .  Back  Surgery  lumbar  1989 /1999   x 2  . BACK SURGERY    . CARDIAC CATHETERIZATION  05/2010  . CARDIAC CATHETERIZATION N/A 09/30/2015   Procedure: Left Heart Cath and Coronary Angiography;  Surgeon: Troy Sine, MD;  Location: [facility];  Service: Cardiovascular;  Laterality: N/A;  . Carpal tunnel release right     x 2  . CHOLECYSTECTOMY    . COLONOSCOPY    . ESOPHAGOGASTRODUODENOSCOPY    . LUMBAR FUSION  March 19, 2014  . NM MYOCAR PERF WALL MOTION  11/12/2008   no ischemia  . PARTIAL HYSTERECTOMY  1972  . ROTATOR CUFF REPAIR     right  . SPINE SURGERY    . THYROIDECTOMY  11/17/2011   Procedure: THYROIDECTOMY;  Surgeon: Ascencion Dike, MD;  Location: [facility];  Service: ENT;  Laterality: Right;  WITH FROZEN SECTION  . TONSILLECTOMY      Outpatient Medications Prior to Visit  Medication Sig Dispense Refill  . amLODipine (NORVASC) 10 MG tablet take 1 tablet by mouth once daily 30 tablet 5  . Ascorbic Acid (VITAMIN C) 500 MG tablet Take 500 mg by mouth daily.     Marland Kitchen aspirin EC 81 MG tablet Take 81 mg by mouth daily.    . calcium carbonate (OS-CAL - DOSED IN MG OF ELEMENTAL CALCIUM) 1250 MG tablet Take 1 tablet by mouth daily.    . Cyanocobalamin (B-12) 1000 MCG CAPS Take 1 capsule by mouth daily.    . diclofenac sodium (VOLTAREN) 1 % GEL Apply 1 application topically 4 (four) times daily. (Patient taking differently: Apply 2 g topically 2 (two) times daily as needed (for back pain). ) 100 g 4  . EPINEPHrine (EPIPEN 2-PAK) 0.3 mg/0.3 mL IJ SOAJ injection INJECT AS DIRECTED FOR SEVERE ALLERGIC REACTIONS 2 Device 1  . ferrous sulfate 325 (65 FE) MG tablet Take 325 mg by mouth daily with breakfast.     . FLUoxetine (PROZAC) 20 MG tablet take 1 and 1/2 tablet by mouth once daily 45 tablet 5  . fluticasone (FLONASE) 50 MCG/ACT nasal spray Place 1 spray into both nostrils daily. 16 g 2  . hydrochlorothiazide (MICROZIDE) 12.5 MG capsule take 1 capsule by mouth once daily 30 capsule 5  . HYDROcodone-acetaminophen (NORCO) 7.5-325 MG tablet Take 1 tablet by mouth daily as needed for moderate pain. 30 tablet 0  . isosorbide mononitrate (IMDUR) 30 MG 24 hr tablet take 1 tablet by mouth once daily 30 tablet 10  . levothyroxine (SYNTHROID, LEVOTHROID) 25 MCG tablet take 1 tablet by mouth every morning 90 tablet 1  . Multiple Vitamin (MULITIVITAMIN WITH MINERALS) TABS Take 1 tablet by mouth daily.    . nitroGLYCERIN (NITROLINGUAL) 0.4 MG/SPRAY spray Place 1 spray under the tongue every 5 (five) minutes x 3 doses as needed for chest pain. 12 g 1  . omeprazole (PRILOSEC) 40 MG capsule Take 1 capsule (40 mg total) by mouth daily. 30 capsule 5  . potassium chloride  (K-DUR) 10 MEQ tablet take 1 tablet twice a day 60 tablet 5  . pyridoxine (B-6) 200 MG tablet Take 200 mg by mouth daily.     Marland Kitchen ZOVIRAX 5 % APPLY TOPICALLY EVERY 3 HOURS. 30 g 1  . atorvastatin (LIPITOR) 10 MG tablet Take 1 tablet (10 mg total) by mouth daily. 30 tablet 5   No facility-administered medications prior to visit.      Allergies:   Ace inhibitors;  Bupropion hcl; Codeine; Penicillins; Shellfish allergy; Angiotensin receptor blockers; and Bee venom   Social History   Social History  . Marital status: Widowed    Spouse name: N/A  . Number of children: 4  . Years of education: N/A   Occupational History  .  Retired   Social History Main Topics  . Smoking status: Former Smoker    Types: Cigarettes    Quit date: 09/06/1984  . Smokeless tobacco: Never Used  . Alcohol use No     Comment: Casual  . Drug use: No  . Sexual activity: No   Other Topics Concern  . None   Social History Narrative  . None     Family History:  The patient's family history includes Anuerysm in her mother; Stroke in her father.   ROS:   Please see the history of present illness.    ROS All other systems reviewed and are negative.   PHYSICAL EXAM:   VS:  BP 124/76   Pulse 83   Ht 5\' 2"  (1.575 m)   Wt 76.8 kg (169 lb 6.4 oz)   BMI 30.98 kg/m    GEN: Well nourished, well developed, in no acute distress  HEENT: normal  Neck: no JVD, carotid bruits, or masses Cardiac: RRR; no murmurs, rubs, or gallops,no edema  Respiratory:  clear to auscultation bilaterally, normal work of breathing GI: soft, nontender, nondistended, + BS MS: no deformity or atrophy  Skin: warm and dry, no rash Neuro:  Alert and Oriented x 3, Strength and sensation are intact Psych: euthymic mood, full affect  Wt Readings from Last 3 Encounters:  10/12/16 76.8 kg (169 lb 6.4 oz)  09/28/16 79.4 kg (175 lb)  04/16/16 79.4 kg (175 lb)      Studies/Labs Reviewed:   EKG:  EKG is not ordered today.    Recent  Labs: 12/04/2015: TSH 3.56 09/21/2016: ALT 12; BUN 19; Creat 1.00; Potassium 4.2; Sodium 140   Lipid Panel    Component Value Date/Time   CHOL 187 09/21/2016 1341   TRIG 131 09/21/2016 1341   HDL 45 (L) 09/21/2016 1341   CHOLHDL 4.2 09/21/2016 1341   VLDL 26 09/21/2016 1341   LDLCALC 116 (H) 09/21/2016 1341       ASSESSMENT:    1. Atherosclerosis of native coronary artery of native heart without angina pectoris   2. Coronary vasospasm (HCC)   3. Essential hypertension   4. Obesity (BMI 30.0-34.9)   5. Pure hypercholesterolemia      PLAN:  In order of problems listed above:  1. Coronary atherosclerosis without significant stenoses at cath. Will pursue risk factor modification. 2. Possible vasospastic angina:  Seems to be well controlled on a combination of long-acting nitrates and amlodipine.. 3. Obesity: Congratulated on the weight that she has lost so far, getting close to being only "overweight" not obese. Weight loss would be beneficial to prevent progression to full-blown diabetes. 4. HTN: Well controlled. Prefer use of vasodilators for her blood pressure control. 5. HLP: Need to increase her dose of atorvastatin back to 20 milligrams daily. Target LDL under 70.   medicines are reviewed at length with the patient today.  Concerns regarding medicines are outlined above.  Medication changes, Labs and Tests ordered today are listed in the Patient Instructions below. Patient Instructions  Dr Sallyanne Kuster has recommended making the following medication changes: 1. INCREASE Atorvastatin to 20 mg daily  Your physician recommends that you schedule a follow-up appointment in 1 year. You  will receive a reminder letter in the mail two months in advance. If you don't receive a letter, please call our office to schedule the follow-up appointment.  If you need a refill on your cardiac medications before your next appointment, please call your pharmacy.      Signed, Sanda Klein,  MD  10/12/2016 1:51 PM    [facility] [place], [place], [place]  [address] Phone: [phone]; Fax: [phone]

## 2016-10-16 ENCOUNTER — Encounter (HOSPITAL_COMMUNITY): Payer: Self-pay | Admitting: Emergency Medicine

## 2016-10-16 ENCOUNTER — Emergency Department (HOSPITAL_COMMUNITY)
Admission: EM | Admit: 2016-10-16 | Discharge: 2016-10-17 | Disposition: A | Discharge status: Home / Self Care | Payer: Medicare HMO | Attending: Emergency Medicine | Admitting: Emergency Medicine

## 2016-10-16 ENCOUNTER — Other Ambulatory Visit: Payer: Self-pay

## 2016-10-16 DIAGNOSIS — T7840XA Allergy, unspecified, initial encounter: Secondary | ICD-10-CM | POA: Diagnosis present

## 2016-10-16 DIAGNOSIS — Z7982 Long term (current) use of aspirin: Secondary | ICD-10-CM | POA: Diagnosis not present

## 2016-10-16 DIAGNOSIS — I10 Essential (primary) hypertension: Secondary | ICD-10-CM | POA: Insufficient documentation

## 2016-10-16 DIAGNOSIS — I2510 Atherosclerotic heart disease of native coronary artery without angina pectoris: Secondary | ICD-10-CM | POA: Diagnosis not present

## 2016-10-16 DIAGNOSIS — F419 Anxiety disorder, unspecified: Secondary | ICD-10-CM | POA: Diagnosis not present

## 2016-10-16 DIAGNOSIS — R69 Illness, unspecified: Secondary | ICD-10-CM | POA: Diagnosis not present

## 2016-10-16 DIAGNOSIS — L500 Allergic urticaria: Secondary | ICD-10-CM | POA: Insufficient documentation

## 2016-10-16 DIAGNOSIS — Z87891 Personal history of nicotine dependence: Secondary | ICD-10-CM | POA: Diagnosis not present

## 2016-10-16 DIAGNOSIS — Z79899 Other long term (current) drug therapy: Secondary | ICD-10-CM | POA: Insufficient documentation

## 2016-10-16 MED ORDER — DIPHENHYDRAMINE HCL 50 MG/ML IJ SOLN
50.0000 mg | Freq: Once | INTRAMUSCULAR | Status: AC
  Administered 2016-10-17: 50 mg via INTRAVENOUS
  Filled 2016-10-16: qty 1

## 2016-10-16 MED ORDER — FAMOTIDINE IN NACL 20-0.9 MG/50ML-% IV SOLN
20.0000 mg | Freq: Once | INTRAVENOUS | Status: AC
  Administered 2016-10-17: 20 mg via INTRAVENOUS
  Filled 2016-10-16: qty 50

## 2016-10-16 MED ORDER — RACEPINEPHRINE HCL 2.25 % IN NEBU
INHALATION_SOLUTION | RESPIRATORY_TRACT | Status: AC
  Filled 2016-10-16: qty 0.5

## 2016-10-16 MED ORDER — METHYLPREDNISOLONE SODIUM SUCC 125 MG IJ SOLR
125.0000 mg | Freq: Once | INTRAMUSCULAR | Status: AC
  Administered 2016-10-17: 125 mg via INTRAVENOUS
  Filled 2016-10-16: qty 2

## 2016-10-16 MED ORDER — EPINEPHRINE 0.3 MG/0.3ML IJ SOAJ
INTRAMUSCULAR | Status: AC
  Filled 2016-10-16: qty 0.3

## 2016-10-16 NOTE — ED Provider Notes (Addendum)
[facility] DEPT Provider Note   CSN: [id number] Arrival date & time: 10/16/16  2336    By signing my name below, I, [name], attest that this documentation has been prepared under the direction and in the presence of Kaitlyn Porter, MD. Electronically Signed: Macon Tyler, ED Scribe. 10/16/16. 1:33 AM.  Time seen 23:40 PM  History   Chief Complaint Chief Complaint  Patient presents with  . Allergic Reaction   The history is provided by the patient and a relative. No language interpreter was used.   HPI Comments: [name] is a 83 y.o. female who presents to the Emergency Department presenting complaining of sudden onset, hives s/p allergic reaction that occurred this evening. Pt reports associated hives on her bilateral legs and arms. Pt notes she had a home-made [place] sandwich and coke for dinner. She states she has had this meal before with no complications. Pt states this is the first time she's had the store bought [place] she used for her sandwich. Pt states she began to itch on both her legs an hour after having dinner. She also notes having the sensation that something was in her throat and maybe her tongue was going to swell. She states she was didn't use her epi pen. No alleviating factors noted. Pt denies swelling in lips, nausea, vomiting, diarrhea. She did feel SOB.No medications were taken prior to arrival  Pt's PCP is Dr. Tula Tyler.   Past Medical History:  Diagnosis Date  . Anemia   . Blood clot associated with vein wall inflammation    hx of in right leg  . CAD (coronary artery disease)   . Carpal tunnel syndrome on left    Left Hand pain with numbness, new onset   . Chronic back pain   . Chronic back pain    buldging disc  . Constipation   . Diarrhea   . Difficult intubation    small airway  . DJD (degenerative joint disease)    Severe  . Gastric ulcer    hx of   . GERD (gastroesophageal reflux disease)    takes Omeprazole daily    . Headache(784.0)    occasionally  . Hyperlipidemia    takes Lipitor nightly  . Hypertension    takes Amlodipine and Metoprolol daily  . Nocturia   . Obesity   . Pneumonia    hx of-in 2011  . Shortness of breath    with exertion  . Sleep apnea    sleep study done 65yrs ago  . Thyroid mass    right    Patient Active Problem List   Diagnosis Date Noted  . Obesity (BMI 30.0-34.9) 10/12/2016  . Knee pain, left 09/28/2016  . Encounter for chronic pain management 12/07/2015  . CAD in native artery   . Leg pain, right 06/25/2014  . Need for influenza vaccination 06/25/2014  . Right knee pain 04/17/2013  . Coronary vasospasm ([place]) 02/07/2013  . Depression 12/19/2012  . Hypothyroid 08/14/2012  . Prediabetes 10/10/2011  . CARPAL TUNNEL SYNDROME, BILATERAL 02/19/2010  . GERD 09/15/2009  . Coronary atherosclerosis 10/01/2008  . DYSPEPSIA 07/07/2008  . BACK PAIN WITH RADICULOPATHY 07/03/2008  . Hyperlipidemia 11/24/2007  . Essential hypertension 11/24/2007    Past Surgical History:  Procedure Laterality Date  . ABDOMINAL HYSTERECTOMY    . APPENDECTOMY  1987  . Arthoscopy left knee  1992  . Arthroscopy left shoulder  1999  . Back  Surgery  lumbar  1989 /1999   x  2  . BACK SURGERY    . CARDIAC CATHETERIZATION  05/2010  . CARDIAC CATHETERIZATION N/A 09/30/2015   Procedure: Left Heart Cath and Coronary Angiography;  Surgeon: Troy Sine, MD;  Location: [facility];  Service: Cardiovascular;  Laterality: N/A;  . Carpal tunnel release right     x 2  . CHOLECYSTECTOMY    . COLONOSCOPY    . ESOPHAGOGASTRODUODENOSCOPY    . LUMBAR FUSION  March 19, 2014  . NM MYOCAR PERF WALL MOTION  11/12/2008   no ischemia  . PARTIAL HYSTERECTOMY  1972  . ROTATOR CUFF REPAIR     right  . SPINE SURGERY    . THYROIDECTOMY  11/17/2011   Procedure: THYROIDECTOMY;  Surgeon: Ascencion Dike, MD;  Location: [facility];  Service: ENT;  Laterality: Right;  WITH FROZEN SECTION  . TONSILLECTOMY       OB History    No data available       Home Medications    Prior to Admission medications   Medication Sig Start Date End Date Taking? Authorizing Provider  amLODipine (NORVASC) 10 MG tablet take 1 tablet by mouth once daily 07/07/16   Fayrene Helper, MD  Ascorbic Acid (VITAMIN C) 500 MG tablet Take 500 mg by mouth daily.     Historical Provider, MD  aspirin EC 81 MG tablet Take 81 mg by mouth daily.    Historical Provider, MD  atorvastatin (LIPITOR) 20 MG tablet Take 1 tablet (20 mg total) by mouth daily. 10/12/16   Mihai Croitoru, MD  calcium carbonate (OS-CAL - DOSED IN MG OF ELEMENTAL CALCIUM) 1250 MG tablet Take 1 tablet by mouth daily.    Historical Provider, MD  Cyanocobalamin (B-12) 1000 MCG CAPS Take 1 capsule by mouth daily.    Historical Provider, MD  diclofenac sodium (VOLTAREN) 1 % GEL Apply 1 application topically 4 (four) times daily. Patient taking differently: Apply 2 g topically 2 (two) times daily as needed (for back pain).  02/28/12   Fayrene Helper, MD  EPINEPHrine (EPIPEN 2-PAK) 0.3 mg/0.3 mL IJ SOAJ injection INJECT AS DIRECTED FOR SEVERE ALLERGIC REACTIONS 12/23/15   Fayrene Helper, MD  famotidine (PEPCID) 20 MG tablet Take 1 tablet (20 mg total) by mouth 2 (two) times daily. 10/17/16   Kaitlyn Porter, MD  ferrous sulfate 325 (65 FE) MG tablet Take 325 mg by mouth daily with breakfast.     Historical Provider, MD  FLUoxetine (PROZAC) 20 MG tablet take 1 and 1/2 tablet by mouth once daily 04/07/16   Fayrene Helper, MD  fluticasone [facility]) 50 MCG/ACT nasal spray Place 1 spray into both nostrils daily. 05/22/15   Fayrene Helper, MD  hydrochlorothiazide (MICROZIDE) 12.5 MG capsule take 1 capsule by mouth once daily 10/21/14   Fayrene Helper, MD  HYDROcodone-acetaminophen [facility]) 7.5-325 MG tablet Take 1 tablet by mouth daily as needed for moderate pain. 09/28/16   Fayrene Helper, MD  isosorbide mononitrate (IMDUR) 30 MG 24 hr tablet take 1 tablet by  mouth once daily 01/27/16   Kaitlyn Klein, MD  levothyroxine (SYNTHROID, LEVOTHROID) 25 MCG tablet take 1 tablet by mouth every morning 07/08/16   Fayrene Helper, MD  Multiple Vitamin (MULITIVITAMIN WITH MINERALS) TABS Take 1 tablet by mouth daily.    Historical Provider, MD  nitroGLYCERIN (NITROLINGUAL) 0.4 MG/SPRAY spray Place 1 spray under the tongue every 5 (five) minutes x 3 doses as needed for chest pain. 08/14/14   Mihai  Croitoru, MD  omeprazole (PRILOSEC) 40 MG capsule Take 1 capsule (40 mg total) by mouth daily. 11/13/15   Fayrene Helper, MD  potassium chloride (K-DUR) 10 MEQ tablet take 1 tablet twice a day 07/07/16   Fayrene Helper, MD  predniSONE (DELTASONE) 20 MG tablet Take 3 po QD x 3d , then 2 po QD x 3d then 1 po QD x 3d 10/17/16   Kaitlyn Porter, MD  pyridoxine (B-6) 200 MG tablet Take 200 mg by mouth daily.     Historical Provider, MD  ZOVIRAX 5 % APPLY TOPICALLY EVERY 3 HOURS. 01/24/13   Fayrene Helper, MD    Family History Family History  Problem Relation Age of Onset  . Anuerysm Mother     died at 80  . Stroke Father     40 at death   . Anesthesia problems Neg Hx   . Hypotension Neg Hx   . Malignant hyperthermia Neg Hx   . Pseudochol deficiency Neg Hx     Social History Social History  Substance Use Topics  . Smoking status: Former Smoker    Types: Cigarettes    Quit date: 09/06/1984  . Smokeless tobacco: Never Used  . Alcohol use No     Comment: Casual  lives at home Lives alone   Allergies   Ace inhibitors; Bupropion hcl; Codeine; Penicillins; Shellfish allergy; Angiotensin receptor blockers; and Bee venom   Review of Systems Review of Systems  Gastrointestinal: Negative for nausea and vomiting.  Skin: Positive for rash (hives).  All other systems reviewed and are negative.    Physical Exam Updated Vital Signs BP (!) 148/103 (BP Location: Left Arm)   Pulse 99   Temp 98 F (36.7 C) (Oral)   Resp 21   Wt 170 lb (77.1 kg)   SpO2 100%    BMI 31.09 kg/m   Vital signs normal except diastolic hypertension   Physical Exam  Constitutional: She is oriented to person, place, and time. She appears well-developed and well-nourished.  Non-toxic appearance. She does not appear ill. No distress.  HENT:  Head: Normocephalic and atraumatic.  Right Ear: External ear normal.  Left Ear: External ear normal.  Nose: Nose normal. No mucosal edema or rhinorrhea.  Mouth/Throat: Oropharynx is clear and moist and mucous membranes are normal. No dental abscesses or uvula swelling.  No obvious swelling of her tongue or lips noted. Her voice sounds normal.   Eyes: Conjunctivae and EOM are normal. Pupils are equal, round, and reactive to light.  Neck: Normal range of motion and full passive range of motion without pain. Neck supple.  Cardiovascular: Normal rate, regular rhythm and normal heart sounds.  Exam reveals no gallop and no friction rub.   No murmur heard. Pulmonary/Chest: Effort normal and breath sounds normal. No respiratory distress. She has no wheezes. She has no rhonchi. She has no rales. She exhibits no tenderness and no crepitus.  Abdominal: Soft. Normal appearance and bowel sounds are normal. She exhibits no distension. There is no tenderness. There is no rebound and no guarding.  Musculoskeletal: Normal range of motion. She exhibits no edema or tenderness.  Moves all extremities well.   Neurological: She is alert and oriented to person, place, and time. She has normal strength. No cranial nerve deficit.  Skin: Skin is warm, dry and intact. No rash noted. No erythema. No pallor.  Grandson said there was rash on skin, but none now.   Psychiatric: Her speech is normal and behavior  is normal. Her mood appears anxious.  Nursing note and vitals reviewed.    ED Treatments / Results   DIAGNOSTIC STUDIES: Oxygen Saturation is 100% on RA, normal by my interpretation.    ED ECG REPORT   Date: 10/17/2016  Rate: 91  Rhythm: normal  sinus rhythm  QRS Axis: normal  Intervals: normal  ST/T Wave abnormalities: normal  Conduction Disutrbances:early transition R waves in anterior leads  Narrative Interpretation:   Old EKG Reviewed: none available  I have personally reviewed the EKG tracing and agree with the computerized printout as noted.    Procedures Procedures (including critical care time)  Medications Ordered in ED Medications  Racepinephrine HCl 2.25 % nebulizer solution (  Not Given 10/17/16 0006)  diphenhydrAMINE (BENADRYL) injection 50 mg (50 mg Intravenous Given 10/17/16 0000)  methylPREDNISolone sodium succinate (SOLU-MEDROL) 125 mg/2 mL injection 125 mg (125 mg Intravenous Given 10/17/16 0000)  famotidine (PEPCID) IVPB 20 mg premix (20 mg Intravenous New Bag/Given 10/17/16 0000)     Initial Impression / Assessment and Plan / ED Course  I have reviewed the triage vital signs and the nursing notes.  Pertinent labs & imaging results that were available during my care of the patient were reviewed by me and considered in my medical decision making (see chart for details).   COORDINATION OF CARE: 11:47 PM Discussed treatment plan with pt at bedside which includes antacid and antihistamine and pt agreed to plan. Patient was given IV Solu-Medrol, Benadryl, and Pepcid.  Recheck at 1 AM patient is feeling much improved. She no longer feels like her tongue is going to swell or that she has any difficulty of her throat. Her rash continues to be gone.  Final Clinical Impressions(s) / ED Diagnoses   Final diagnoses:  Allergic reaction, initial encounter    New Prescriptions New Prescriptions   FAMOTIDINE (PEPCID) 20 MG TABLET    Take 1 tablet (20 mg total) by mouth 2 (two) times daily.   PREDNISONE (DELTASONE) 20 MG TABLET    Take 3 po QD x 3d , then 2 po QD x 3d then 1 po QD x 3d    Plan discharge  Kaitlyn Porter, MD, FACEP  I personally performed the services described in this documentation, which was  scribed in my presence. The recorded information has been reviewed and considered.  Kaitlyn Porter, MD, Barbette Or, MD 10/17/16 [id number]    Kaitlyn Porter, MD 10/17/16 0730

## 2016-10-16 NOTE — ED Triage Notes (Signed)
Pt ate a [place] sandwich with a coke this evening.  One hour after eating became SOB.  Pt states she feels as though tongue is swelling.  No swelling noted

## 2016-10-17 DIAGNOSIS — I2510 Atherosclerotic heart disease of native coronary artery without angina pectoris: Secondary | ICD-10-CM | POA: Diagnosis not present

## 2016-10-17 DIAGNOSIS — Z79899 Other long term (current) drug therapy: Secondary | ICD-10-CM | POA: Diagnosis not present

## 2016-10-17 DIAGNOSIS — I10 Essential (primary) hypertension: Secondary | ICD-10-CM | POA: Diagnosis not present

## 2016-10-17 DIAGNOSIS — L500 Allergic urticaria: Secondary | ICD-10-CM | POA: Diagnosis not present

## 2016-10-17 DIAGNOSIS — R69 Illness, unspecified: Secondary | ICD-10-CM | POA: Diagnosis not present

## 2016-10-17 DIAGNOSIS — Z7982 Long term (current) use of aspirin: Secondary | ICD-10-CM | POA: Diagnosis not present

## 2016-10-17 DIAGNOSIS — Z87891 Personal history of nicotine dependence: Secondary | ICD-10-CM | POA: Diagnosis not present

## 2016-10-17 MED ORDER — PREDNISONE 20 MG PO TABS: ORAL_TABLET | ORAL | 0 refills | Status: DC

## 2016-10-17 MED ORDER — FAMOTIDINE 20 MG PO TABS: 20.0000 mg | ORAL_TABLET | Freq: Two times a day (BID) | ORAL | 0 refills | Status: DC

## 2016-10-17 NOTE — Progress Notes (Signed)
Pulled [facility] for neb allergic reaction ,patient having anxiety more than reaction. Neb not given.

## 2016-10-17 NOTE — ED Notes (Signed)
Pt states understanding of care given and follow up instructions.  Ambulated from ED with steady gait 

## 2016-10-17 NOTE — Discharge Instructions (Signed)
Stay cool, heat will make you have more itching or rash. Take the medications as prescribed. You can take benadryl OTC 50 mg every 6 hrs as needed for rash or itching. Recheck if you have difficulty swallowing, breathing or seem worse.

## 2016-10-22 NOTE — Telephone Encounter (Signed)
Patient was recently placed on lipitor 20mg  by dr Jarold Motto.

## 2016-10-27 ENCOUNTER — Ambulatory Visit (HOSPITAL_COMMUNITY)
Admission: RE | Admit: 2016-10-27 | Discharge: 2016-10-27 | Disposition: A | Discharge status: Home / Self Care | Payer: Medicare HMO | Source: Ambulatory Visit | Attending: Family Medicine | Admitting: Family Medicine

## 2016-10-27 DIAGNOSIS — Z1231 Encounter for screening mammogram for malignant neoplasm of breast: Secondary | ICD-10-CM | POA: Insufficient documentation

## 2016-11-26 ENCOUNTER — Other Ambulatory Visit: Payer: Self-pay

## 2016-12-27 ENCOUNTER — Ambulatory Visit: Payer: Medicare HMO | Admitting: Family Medicine

## 2016-12-31 ENCOUNTER — Other Ambulatory Visit: Payer: Self-pay | Admitting: Family Medicine

## 2017-01-06 ENCOUNTER — Telehealth: Payer: Self-pay | Admitting: Family Medicine

## 2017-01-06 NOTE — Telephone Encounter (Signed)
Patient calling requesting hydrocodone.  Please call if she can pick up today.  She would like to send her grandson, Percell Miller to pick up.  Call home #  [phone]

## 2017-01-06 NOTE — Telephone Encounter (Signed)
No, these cannot be picked up. Must have an office visit every 12 weeks to get refills. She cancelled her 04/24 refill visit and rescheduled it for May 24. She needs appt sooner if she is about out of med

## 2017-01-06 NOTE — Telephone Encounter (Signed)
FYI: patient has appt May 25th and wishes to keep this appt for now.  She has had some damage to her home (the roof) in [place] and this needs attention right now.  So if she can get everything taken care of there, she will make a sooner appt for her meds but will do this at a later time.

## 2017-01-07 NOTE — Telephone Encounter (Signed)
noted 

## 2017-01-21 ENCOUNTER — Other Ambulatory Visit: Payer: Self-pay | Admitting: Family Medicine

## 2017-01-21 ENCOUNTER — Other Ambulatory Visit: Payer: Self-pay

## 2017-01-21 MED ORDER — ISOSORBIDE MONONITRATE ER 30 MG PO TB24: 30.0000 mg | ORAL_TABLET | Freq: Every day | ORAL | 10 refills | Status: DC

## 2017-01-26 ENCOUNTER — Other Ambulatory Visit: Payer: Self-pay | Admitting: Family Medicine

## 2017-01-27 ENCOUNTER — Encounter: Payer: Self-pay | Admitting: Family Medicine

## 2017-01-27 ENCOUNTER — Ambulatory Visit (INDEPENDENT_AMBULATORY_CARE_PROVIDER_SITE_OTHER): Payer: Medicare HMO | Admitting: Family Medicine

## 2017-01-27 VITALS — BP 124/84 | HR 79 | Resp 16 | Ht 62.0 in | Wt 163.0 lb

## 2017-01-27 DIAGNOSIS — E7800 Pure hypercholesterolemia, unspecified: Secondary | ICD-10-CM

## 2017-01-27 DIAGNOSIS — I10 Essential (primary) hypertension: Secondary | ICD-10-CM

## 2017-01-27 DIAGNOSIS — M25561 Pain in right knee: Secondary | ICD-10-CM | POA: Diagnosis not present

## 2017-01-27 DIAGNOSIS — G8929 Other chronic pain: Secondary | ICD-10-CM

## 2017-01-27 DIAGNOSIS — Z91013 Allergy to seafood: Secondary | ICD-10-CM

## 2017-01-27 DIAGNOSIS — R69 Illness, unspecified: Secondary | ICD-10-CM | POA: Diagnosis not present

## 2017-01-27 DIAGNOSIS — F329 Major depressive disorder, single episode, unspecified: Secondary | ICD-10-CM | POA: Diagnosis not present

## 2017-01-27 DIAGNOSIS — F32A Depression, unspecified: Secondary | ICD-10-CM

## 2017-01-27 DIAGNOSIS — E890 Postprocedural hypothyroidism: Secondary | ICD-10-CM

## 2017-01-27 DIAGNOSIS — Z889 Allergy status to unspecified drugs, medicaments and biological substances status: Secondary | ICD-10-CM | POA: Diagnosis not present

## 2017-01-27 LAB — COMPREHENSIVE METABOLIC PANEL
ALT: 13 U/L (ref 6–29)
AST: 24 U/L (ref 10–35)
Albumin: 4 g/dL (ref 3.6–5.1)
Alkaline Phosphatase: 88 U/L (ref 33–130)
BUN: 15 mg/dL (ref 7–25)
CO2: 29 mmol/L (ref 20–31)
Calcium: 9.2 mg/dL (ref 8.6–10.4)
Chloride: 103 mmol/L (ref 98–110)
Creat: 0.96 mg/dL — ABNORMAL HIGH (ref 0.60–0.88)
Glucose, Bld: 89 mg/dL (ref 65–99)
Potassium: 4.4 mmol/L (ref 3.5–5.3)
Sodium: 140 mmol/L (ref 135–146)
Total Bilirubin: 0.5 mg/dL (ref 0.2–1.2)
Total Protein: 6.8 g/dL (ref 6.1–8.1)

## 2017-01-27 LAB — LIPID PANEL
Cholesterol: 188 mg/dL (ref <200)
HDL: 46 mg/dL — ABNORMAL LOW (ref >50)
LDL Cholesterol: 113 mg/dL — ABNORMAL HIGH (ref <100)
Total CHOL/HDL Ratio: 4.1 Ratio (ref <5.0)
Triglycerides: 143 mg/dL (ref <150)
VLDL: 29 mg/dL (ref <30)

## 2017-01-27 LAB — CBC
HCT: 41.5 % (ref 35.0–45.0)
Hemoglobin: 13.2 g/dL (ref 11.7–15.5)
MCH: 28.3 pg (ref 27.0–33.0)
MCHC: 31.8 g/dL — ABNORMAL LOW (ref 32.0–36.0)
MCV: 89.1 fL (ref 80.0–100.0)
MPV: 9.4 fL (ref 7.5–12.5)
Platelets: 266 10*3/uL (ref 140–400)
RBC: 4.66 MIL/uL (ref 3.80–5.10)
RDW: 13.8 % (ref 11.0–15.0)
WBC: 4.6 10*3/uL (ref 3.8–10.8)

## 2017-01-27 MED ORDER — HYDROCODONE-ACETAMINOPHEN 7.5-325 MG PO TABS: 1.0000 | ORAL_TABLET | Freq: Every day | ORAL | 0 refills | Status: DC | PRN

## 2017-01-27 NOTE — Patient Instructions (Addendum)
Annual physical exam in 3 months, call if you need me before  Fasting labs this week please  Continue medication as before and it is vital that you keep appointments as scheduled   Memory evaluation shows slight impairment, not sufficient for you to take medication, but it is very important that you do things to stimulate your brain and keep things in order   Be careful not to fall, especially as your left knee has severe arthritis  Continue to be the strong Mother and lady that you are   Fall Prevention in the Home Falls can cause injuries. They can happen to people of all ages. There are many things you can do to make your home safe and to help prevent falls. What can I do on the outside of my home?  Regularly fix the edges of walkways and driveways and fix any cracks.  Remove anything that might make you trip as you walk through a door, such as a raised step or threshold.  Trim any bushes or trees on the path to your home.  Use bright outdoor lighting.  Clear any walking paths of anything that might make someone trip, such as rocks or tools.  Regularly check to see if handrails are loose or broken. Make sure that both sides of any steps have handrails.  Any raised decks and porches should have guardrails on the edges.  Have any leaves, snow, or ice cleared regularly.  Use sand or salt on walking paths during winter.  Clean up any spills in your garage right away. This includes oil or grease spills. What can I do in the bathroom?  Use night lights.  Install grab bars by the toilet and in the tub and shower. Do not use towel bars as grab bars.  Use non-skid mats or decals in the tub or shower.  If you need to sit down in the shower, use a plastic, non-slip stool.  Keep the floor dry. Clean up any water that spills on the floor as soon as it happens.  Remove soap buildup in the tub or shower regularly.  Attach bath mats securely with double-sided non-slip rug  tape.  Do not have throw rugs and other things on the floor that can make you trip. What can I do in the bedroom?  Use night lights.  Make sure that you have a light by your bed that is easy to reach.  Do not use any sheets or blankets that are too big for your bed. They should not hang down onto the floor.  Have a firm chair that has side arms. You can use this for support while you get dressed.  Do not have throw rugs and other things on the floor that can make you trip. What can I do in the kitchen?  Clean up any spills right away.  Avoid walking on wet floors.  Keep items that you use a lot in easy-to-reach places.  If you need to reach something above you, use a strong step stool that has a grab bar.  Keep electrical cords out of the way.  Do not use floor polish or wax that makes floors slippery. If you must use wax, use non-skid floor wax.  Do not have throw rugs and other things on the floor that can make you trip. What can I do with my stairs?  Do not leave any items on the stairs.  Make sure that there are handrails on both sides of the stairs  and use them. Fix handrails that are broken or loose. Make sure that handrails are as long as the stairways.  Check any carpeting to make sure that it is firmly attached to the stairs. Fix any carpet that is loose or worn.  Avoid having throw rugs at the top or bottom of the stairs. If you do have throw rugs, attach them to the floor with carpet tape.  Make sure that you have a light switch at the top of the stairs and the bottom of the stairs. If you do not have them, ask someone to add them for you. What else can I do to help prevent falls?  Wear shoes that:  Do not have high heels.  Have rubber bottoms.  Are comfortable and fit you well.  Are closed at the toe. Do not wear sandals.  If you use a stepladder:  Make sure that it is fully opened. Do not climb a closed stepladder.  Make sure that both sides of the  stepladder are locked into place.  Ask someone to hold it for you, if possible.  Clearly mark and make sure that you can see:  Any grab bars or handrails.  First and last steps.  Where the edge of each step is.  Use tools that help you move around (mobility aids) if they are needed. These include:  Canes.  Walkers.  Scooters.  Crutches.  Turn on the lights when you go into a dark area. Replace any light bulbs as soon as they burn out.  Set up your furniture so you have a clear path. Avoid moving your furniture around.  If any of your floors are uneven, fix them.  If there are any pets around you, be aware of where they are.  Review your medicines with your doctor. Some medicines can make you feel dizzy. This can increase your chance of falling. Ask your doctor what other things that you can do to help prevent falls. This information is not intended to replace advice given to you by your health care provider. Make sure you discuss any questions you have with your health care provider. Document Released: 06/19/2009 Document Revised: 01/29/2016 Document Reviewed: 09/27/2014 Elsevier Interactive Patient Education  2017 [employer].

## 2017-01-28 ENCOUNTER — Other Ambulatory Visit: Payer: Self-pay

## 2017-01-28 ENCOUNTER — Telehealth: Payer: Self-pay

## 2017-01-28 LAB — TSH: TSH: 2.79 mIU/L

## 2017-01-28 MED ORDER — PREDNISONE 5 MG (21) PO TBPK: ORAL_TABLET | ORAL | 0 refills | Status: DC

## 2017-01-28 NOTE — Progress Notes (Signed)
pre

## 2017-01-28 NOTE — Telephone Encounter (Signed)
States she has an itchy rash on her chest face and back. No lip or tongue tingling or swelling. Does have her Epi pen for an emergency and will go to the ER should her lips or tongue start swelling. Per dr Moshe Cipro, a pred dose pak called in and patient aware to call back for any concerns

## 2017-01-31 ENCOUNTER — Telehealth: Payer: Self-pay | Admitting: Family Medicine

## 2017-01-31 DIAGNOSIS — Z91013 Allergy to seafood: Secondary | ICD-10-CM | POA: Insufficient documentation

## 2017-01-31 MED ORDER — ATORVASTATIN CALCIUM 20 MG PO TABS: 20.0000 mg | ORAL_TABLET | Freq: Every day | ORAL | 3 refills | Status: DC

## 2017-01-31 NOTE — Assessment & Plan Note (Signed)
Controlled, no change in medication  

## 2017-01-31 NOTE — Assessment & Plan Note (Signed)
Chronic right knee pain due to severe osteoarthritis , managed with daily hydrocodone at bedtime Registry reviewed, patient is 1 month overdue, states was in [place] which she does travel out of state often, I explained the need to stay on a schedule, and have provided scripts to cover 3 months of medication at same dose. Fall precautions are also reviewed

## 2017-01-31 NOTE — Assessment & Plan Note (Signed)
Reports significant allergic reaction to seafood , and requests evaluation by allergist, has had life threatening allergic reaction to ACE inhibitor which required ICU admission

## 2017-01-31 NOTE — Progress Notes (Signed)
   [name]     MRN: [id number]      DOB: [birth year]   HPI [name] is here for follow up and re-evaluation of chronic medical conditions, medication management and review of any available recent lab and radiology data.  Preventive health is updated, specifically  Cancer screening and Immunization.    The PT denies any adverse reactions to current medications since the last visit.  Here for chronic pain management, states she was in [place] taking care of her friend who has a fractured right kneee and was unable to come in  For her pain medication, I explained how important it is  To keep appointments. She states she is unable to function without the medication as she has significant pain and stiffness in the right knee for which surgery has been recommended but which she does not want. She denies buckling of the knee Has concerns about memory and forgetful ness which is echoed by her children. Still often cries a lot for her deceased parents, still has a very difficult time accepting their death, especially her father who died well over the age of 39 with severe dementia  ROS Denies recent fever or chills. Denies sinus pressure, nasal congestion, ear pain or sore throat. Denies chest congestion, productive cough or wheezing. Denies chest pains, palpitations and leg swelling Denies abdominal pain, nausea, vomiting,diarrhea or constipation.   Denies dysuria, frequency, hesitancy or incontinence. . Denies headaches, seizures, numbness, or tingling.  Denies skin break down or rash.   PE  BP 124/84   Pulse 79   Resp 16   Ht 5\' 2"  (1.575 m)   Wt 163 lb (73.9 kg)   SpO2 96%   BMI 29.81 kg/m   Patient alert and oriented and in no cardiopulmonary distress.  HEENT: No facial asymmetry, EOMI,   oropharynx pink and moist.  Neck supple no JVD, no mass.  Chest: Clear to auscultation bilaterally.  CVS: S1, S2 no murmurs, no S3.Regular rate.  ABD: Soft non tender.   Ext:  No edema  MS: Adequate though reduced  ROM spine,  hips and knees. Markedly reduced ROM right knee  Skin: Intact, no ulcerations or rash noted.  Psych: Good eye contact, normal affect. Memory intact not anxious or depressed appearing.  CNS: CN 2-12 intact, power,  normal throughout.no focal deficits noted.  Minicog test normal will do full eval next visit   Assessment & Plan  Right knee pain Chronic right knee pain due to severe osteoarthritis , managed with daily hydrocodone at bedtime Registry reviewed, patient is 1 month overdue, states was in [place] which she does travel out of state often, I explained the need to stay on a schedule, and have provided scripts to cover 3 months of medication at same dose. Fall precautions are also reviewed  Seafood allergy Reports significant allergic reaction to seafood , and requests evaluation by allergist, has had life threatening allergic reaction to ACE inhibitor which required ICU admission  Hyperlipidemia Uncontrolled, needs to reduce fried and fatty foods and continue liptor Hyperlipidemia:Low fat diet discussed and encouraged.   Lipid Panel  Lab Results  Component Value Date   CHOL 188 01/27/2017   HDL 46 (L) 01/27/2017   LDLCALC 113 (H) 01/27/2017   TRIG 143 01/27/2017   CHOLHDL 4.1 01/27/2017  Updated lab needed at/ before next visit.      Hypothyroid Controlled, no change in medication   Depression Controlled, no change in medication

## 2017-01-31 NOTE — Telephone Encounter (Signed)
I called pt to discuss/ review lipid medication and labs, she is taking lipitor 20 mg, this is refilled and I explained that her LDL is too high.  States that the rash which started on chest/ face is now on back and itching, denies shortness of breath, uses benadryl for itch, talking prednisone dose pack.  I offered depo medrol 80 mg Im in office as nurse visit tomorrow , if worse will need to go to Ed today, denies difficulty breathing, swallowing or excess lacrimation, also advised she may take up to two benadryl tabs in 24 hrs but be careful re excess sedation

## 2017-01-31 NOTE — Telephone Encounter (Signed)
Pls see pt as a nurse visit on 02/01/2017. Pls administer depo medrol 80 mg IM for allergic rash present on back and chest, despite oral prednisone. Thank you  I spoke with her today, she denies shortness of breath, difficulty swallowing or excess tearing, or tongue  Swelling, I advised if any of this occurred she needs to go to the ED, she agrees and understands

## 2017-01-31 NOTE — Assessment & Plan Note (Signed)
Uncontrolled, needs to reduce fried and fatty foods and continue liptor Hyperlipidemia:Low fat diet discussed and encouraged.   Lipid Panel  Lab Results  Component Value Date   CHOL 188 01/27/2017   HDL 46 (L) 01/27/2017   LDLCALC 113 (H) 01/27/2017   TRIG 143 01/27/2017   CHOLHDL 4.1 01/27/2017  Updated lab needed at/ before next visit.

## 2017-02-01 ENCOUNTER — Ambulatory Visit (INDEPENDENT_AMBULATORY_CARE_PROVIDER_SITE_OTHER): Payer: Medicare HMO

## 2017-02-01 DIAGNOSIS — R21 Rash and other nonspecific skin eruption: Secondary | ICD-10-CM | POA: Diagnosis not present

## 2017-02-01 MED ORDER — METHYLPREDNISOLONE ACETATE 80 MG/ML IJ SUSP
80.0000 mg | Freq: Once | INTRAMUSCULAR | Status: AC
  Administered 2017-02-01: 80 mg via INTRAMUSCULAR

## 2017-02-01 NOTE — Telephone Encounter (Signed)
Pt received injection. Rash not bad on her back and face but still itching very badly. Thinks its related to stress

## 2017-02-01 NOTE — Progress Notes (Signed)
Received depo 80mg  with no complications in left deltoid

## 2017-04-11 DIAGNOSIS — M25561 Pain in right knee: Secondary | ICD-10-CM | POA: Diagnosis not present

## 2017-04-11 DIAGNOSIS — M1711 Unilateral primary osteoarthritis, right knee: Secondary | ICD-10-CM | POA: Diagnosis not present

## 2017-04-12 ENCOUNTER — Ambulatory Visit (INDEPENDENT_AMBULATORY_CARE_PROVIDER_SITE_OTHER): Payer: Medicare HMO | Admitting: Allergy & Immunology

## 2017-04-12 ENCOUNTER — Encounter: Payer: Self-pay | Admitting: Allergy & Immunology

## 2017-04-12 VITALS — BP 110/75 | HR 75 | Temp 98.5°F | Resp 17 | Ht 59.84 in | Wt 168.2 lb

## 2017-04-12 DIAGNOSIS — J310 Chronic rhinitis: Secondary | ICD-10-CM | POA: Diagnosis not present

## 2017-04-12 DIAGNOSIS — T7800XD Anaphylactic reaction due to unspecified food, subsequent encounter: Secondary | ICD-10-CM

## 2017-04-12 DIAGNOSIS — T63441D Toxic effect of venom of bees, accidental (unintentional), subsequent encounter: Secondary | ICD-10-CM

## 2017-04-12 DIAGNOSIS — IMO0001 Reserved for inherently not codable concepts without codable children: Secondary | ICD-10-CM

## 2017-04-12 NOTE — Patient Instructions (Signed)
1. Anaphylactic shock due to food, subsequent encounter - Testing to all of our foods was negative, although cucumber was slightly reactive. - There is a the low positive predictive value of food allergy testing and hence the high possibility of false positives. - In contrast, food allergy testing has a high negative predictive value, therefore if testing is negative we can be relatively assured that they are indeed negative.  - We will send a shellfish panel to confirm, given the history of a positive reaction in the past. - We will also get a serum tryptase to rule out mast cell disorders. - EpiPen is up to date.  2. Hymenoptera reaction - We will get testing to look for stinging insect allergies. - We will call you in 1-2 weeks with the results.   3. Chronic rhinitis - We will get testing to look for environmental allergies. - Continue with an antihistamine as needed, but change to a second generation antihistamine like Zyrtec (cetirizine) or Allegra (fexofenadine).  4. No Follow-up on file.   Please inform us of any Emergency Department visits, hospitalizations, or changes in symptoms. Call us before going to the ED for breathing or allergy symptoms since we might be able to fit you in for a sick visit. Feel free to contact us anytime with any questions, problems, or concerns.  It was a pleasure to meet you today! Enjoy the rest of your summer!   Websites that have reliable patient information: 1. American Academy of Asthma, Allergy, and Immunology: www.aaaai.org 2. Food Allergy Research and Education (FARE): foodallergy.org 3. Mothers of Asthmatics: http://www.asthmacommunitynetwork.org 4. American College of Allergy, Asthma, and Immunology: www.acaai.org

## 2017-04-12 NOTE — Progress Notes (Signed)
NEW PATIENT  Date of Service/Encounter:  04/12/17  Referring provider: Fayrene Helper, MD   Assessment:   Anaphylactic shock due to food (shellfish)  Hymenoptera reaction  Chronic rhinitis  Plan/Recommendations:   1. Anaphylactic shock - possibly due to food - Testing to all of our foods was negative, although cucumber was slightly reactive. - There is a the low positive predictive value of food allergy testing and hence the high possibility of false positives. - In contrast, food allergy testing has a high negative predictive value, therefore if testing is negative we can be relatively assured that they are indeed negative.  - We will send a shellfish panel to confirm, given the history of a positive reaction in the past. - We will also get a serum tryptase to rule out mast cell disorders. - EpiPen is up to date.  2. Hymenoptera reaction - We will get testing to look for stinging insect allergies. - We will call you in 1-2 weeks with the results.   3. Chronic rhinitis - We will get testing to look for environmental allergies. - Continue with an antihistamine as needed, but change to a second generation antihistamine like Zyrtec (cetirizine) or Allegra (fexofenadine).  4. Follow up in six months or earlier if needed.    Subjective:   Jeremiah Tarpley is a 83 y.o. female presenting today for evaluation of  Chief Complaint  Patient presents with  . Allergic Reaction    pt.stated that she had swelling in he tongue, hives     Jalin Erpelding has a history of the following: Patient Active Problem List   Diagnosis Date Noted  . Seafood allergy 01/31/2017  . Obesity (BMI 30.0-34.9) 10/12/2016  . Encounter for chronic pain management 12/07/2015  . CAD in native artery   . Right knee pain 04/17/2013  . Coronary vasospasm ([place]) 02/07/2013  . Depression 12/19/2012  . Hypothyroid 08/14/2012  . Prediabetes 10/10/2011  . CARPAL TUNNEL SYNDROME, BILATERAL  02/19/2010  . GERD 09/15/2009  . Coronary atherosclerosis 10/01/2008  . DYSPEPSIA 07/07/2008  . BACK PAIN WITH RADICULOPATHY 07/03/2008  . Hyperlipidemia 11/24/2007  . Essential hypertension 11/24/2007    History obtained from: chart review and patient and her grandson, Percell Miller.  Kieanna Rollo was referred by Fayrene Helper, MD.      Tamia is a 83 y.o. female presenting for an allergy evaluation. She first an allergic reaction when she ended up in the hospital. She had tongue swelling and it was surmised that she had reacted to an ACE inhibitor. This event occurred in 2010. She stopped her ACE inhibitor and then she went 8 years without a problem. She was worked up by Dr. Smitty Pluck initially around 2010. She had testing that was positive to bee venom. She tends to have more reactions in this area compared to when she is in [place], where she owns a home. She spends some time in [place] [facility]) and some time here with her grandson.   The second event occurred around February 10th. She went to the ED and was treated with Benadryl, famotidine, and methylprednisolone. She had eaten a [place] sandwich at that time, but has avoided it since that time. She will still eat chicken without a problem. She does eat very little beef. She does not eat peanuts and rarely eats tree nuts. She does not drink milk but she does eat cheese and ice cream. She does eat eggs, but not often. She does not consume soy much at all.  Around 7 years ago, she had a reaction to a bee sting. She then shortly thereafter developed a shellfish allergy, specifically to shrimp and crab. She can eat oyster.   Ms. Schlee also reports sinus and hay fever symptoms. Her symptoms are bad throughout the year. She takes Benadryl as needed. She does have Flonase which she does not use daily. She has never been allergy tested.    She has no history of asthma and has never needed inhalers. Otherwise, there is no history of  other atopic diseases, including asthma, drug allergies, or urticaria. There is no significant infectious history. Vaccinations are up to date.    Past Medical History: Patient Active Problem List   Diagnosis Date Noted  . Seafood allergy 01/31/2017  . Obesity (BMI 30.0-34.9) 10/12/2016  . Encounter for chronic pain management 12/07/2015  . CAD in native artery   . Right knee pain 04/17/2013  . Coronary vasospasm ([place]) 02/07/2013  . Depression 12/19/2012  . Hypothyroid 08/14/2012  . Prediabetes 10/10/2011  . CARPAL TUNNEL SYNDROME, BILATERAL 02/19/2010  . GERD 09/15/2009  . Coronary atherosclerosis 10/01/2008  . DYSPEPSIA 07/07/2008  . BACK PAIN WITH RADICULOPATHY 07/03/2008  . Hyperlipidemia 11/24/2007  . Essential hypertension 11/24/2007    Medication List:  Allergies as of 04/12/2017      Reactions   Ace Inhibitors Anaphylaxis   Was on ventilator for six days due to airway swelling shut while on ACE inhibitors.   Bupropion Hcl Anaphylaxis   Codeine Anaphylaxis   Penicillins Anaphylaxis   Has patient had a PCN reaction causing immediate rash, facial/tongue/throat swelling, SOB or lightheadedness with hypotension: Yes Has patient had a PCN reaction causing severe rash involving mucus membranes or skin necrosis: No Has patient had a PCN reaction that required hospitalization No Has patient had a PCN reaction occurring within the last 10 years: No If all of the above answers are "NO", then may proceed with Cephalosporin use.   Shellfish Allergy Anaphylaxis   Tongue swells up, Oysters OK   Angiotensin Receptor Blockers Other (See Comments)   Patient unable to recall what medication this might have been and/or her reaction to it.   Bee Venom    Facial swelling       Medication List       Accurate as of 04/12/17  3:16 PM. Always use your most recent med list.          amLODipine 10 MG tablet Commonly known as:  NORVASC take 1 tablet once daily   aspirin EC 81 MG  tablet Take 81 mg by mouth daily.   atorvastatin 20 MG tablet Commonly known as:  LIPITOR Take 1 tablet (20 mg total) by mouth daily.   B-12 1000 MCG Caps Take 1 capsule by mouth daily.   calcium carbonate 1250 (500 Ca) MG tablet Commonly known as:  OS-CAL - dosed in mg of elemental calcium Take 1 tablet by mouth daily.   diclofenac sodium 1 % Gel Commonly known as:  VOLTAREN Apply 1 application topically 4 (four) times daily.   EPIPEN 2-PAK 0.3 mg/0.3 mL Soaj injection Generic drug:  EPINEPHrine inject as directed for SEVERE ALLERGIC REACTIONS   ferrous sulfate 325 (65 FE) MG tablet Take 325 mg by mouth daily with breakfast.   FLUoxetine 20 MG tablet Commonly known as:  PROZAC take 1 and 1/2 tablet by mouth once daily   fluticasone 50 MCG/ACT nasal spray Commonly known as:  FLONASE Place 1 spray into both nostrils daily.  hydrochlorothiazide 12.5 MG capsule Commonly known as:  MICROZIDE take 1 capsule by mouth once daily   HYDROcodone-acetaminophen 7.5-325 MG tablet Commonly known as:  NORCO Take 1 tablet by mouth daily as needed for moderate pain.   isosorbide mononitrate 30 MG 24 hr tablet Commonly known as:  IMDUR Take 1 tablet (30 mg total) by mouth daily.   levothyroxine 25 MCG tablet Commonly known as:  SYNTHROID, LEVOTHROID take 1 tablet every morning   multivitamin with minerals Tabs tablet Take 1 tablet by mouth daily.   nitroGLYCERIN 0.4 MG/SPRAY spray Commonly known as:  NITROLINGUAL Place 1 spray under the tongue every 5 (five) minutes x 3 doses as needed for chest pain.   potassium chloride 10 MEQ tablet Commonly known as:  K-DUR take 1 tablet twice a day   pyridoxine 200 MG tablet Commonly known as:  B-6 Take 200 mg by mouth daily.   vitamin C 500 MG tablet Commonly known as:  ASCORBIC ACID Take 500 mg by mouth daily.   ZOVIRAX 5 % Generic drug:  acyclovir ointment APPLY TOPICALLY EVERY 3 HOURS.       Birth History:  non-contributory.  Developmental History: non-contributory.   Past Surgical History: Past Surgical History:  Procedure Laterality Date  . ABDOMINAL HYSTERECTOMY    . APPENDECTOMY  1987  . Arthoscopy left knee  1992  . Arthroscopy left shoulder  1999  . Back  Surgery  lumbar  1989 /1999   x 2  . BACK SURGERY    . CARDIAC CATHETERIZATION  05/2010  . CARDIAC CATHETERIZATION N/A 09/30/2015   Procedure: Left Heart Cath and Coronary Angiography;  Surgeon: Troy Sine, MD;  Location: [facility];  Service: Cardiovascular;  Laterality: N/A;  . Carpal tunnel release right     x 2  . CHOLECYSTECTOMY    . COLONOSCOPY    . ESOPHAGOGASTRODUODENOSCOPY    . LUMBAR FUSION  March 19, 2014  . NM MYOCAR PERF WALL MOTION  11/12/2008   no ischemia  . PARTIAL HYSTERECTOMY  1972  . ROTATOR CUFF REPAIR     right  . SPINE SURGERY    . THYROIDECTOMY  11/17/2011   Procedure: THYROIDECTOMY;  Surgeon: Ascencion Dike, MD;  Location: [facility];  Service: ENT;  Laterality: Right;  WITH FROZEN SECTION  . TONSILLECTOMY       Family History: Family History  Problem Relation Age of Onset  . Anuerysm Mother        died at 39  . Stroke Father        25 at death   . Anesthesia problems Neg Hx   . Hypotension Neg Hx   . Malignant hyperthermia Neg Hx   . Pseudochol deficiency Neg Hx   . Allergic rhinitis Neg Hx   . Angioedema Neg Hx   . Asthma Neg Hx   . Atopy Neg Hx   . Eczema Neg Hx   . Immunodeficiency Neg Hx   . Urticaria Neg Hx      Social History: Airyonna lives at home by herself in a home next door to her grandson, who accompanies her today. She also has a home in the [facility] area where she does spend part of the time. Her daughter lives in there the remainder of the year. There is no water damage in her home and there are no cockroaches present. She has hardwoods in the main living areas and carpeting throughout. She has electric heating and central cooling.  There are no animals inside or  outside of the home. There is no tobacco exposure. She is retired for the past 25 years. She did smoke from [place] through 1965.     Review of Systems: a 14-point review of systems is pertinent for what is mentioned in HPI.  Otherwise, all other systems were negative. Constitutional: negative other than that listed in the HPI Eyes: negative other than that listed in the HPI Ears, nose, mouth, throat, and face: negative other than that listed in the HPI Respiratory: negative other than that listed in the HPI Cardiovascular: negative other than that listed in the HPI Gastrointestinal: negative other than that listed in the HPI Genitourinary: negative other than that listed in the HPI Integument: negative other than that listed in the HPI Hematologic: negative other than that listed in the HPI Musculoskeletal: negative other than that listed in the HPI Neurological: negative other than that listed in the HPI Allergy/Immunologic: negative other than that listed in the HPI    Objective:   Blood pressure 110/75, pulse 75, temperature 98.5 F (36.9 C), temperature source Oral, resp. rate 17, height 4' 11.84" (1.52 m), weight 168 lb 3.2 oz (76.3 kg), SpO2 97 %. Body mass index is 33.02 kg/m.   Physical Exam:  General: Alert, interactive, in no acute distress. Pleasant and rather adorable.  Eyes: No conjunctival injection present on the right, No conjunctival injection present on the left, PERRL bilaterally, No discharge on the right, No discharge on the left and No Horner-Trantas dots present Ears: Right TM pearly gray with normal light reflex, Left TM pearly gray with normal light reflex, Right TM intact without perforation and Left TM intact without perforation.  Nose/Throat: External nose within normal limits and septum midline, turbinates edematous with clear discharge, post-pharynx erythematous without cobblestoning in the posterior oropharynx. Tonsils 2+ without exudates Neck: Supple  without thyromegaly.  Adenopathy: No enlarged lymph nodes appreciated in the anterior cervical, occipital, axillary, epitrochlear, inguinal, or popliteal regions. Lungs: Clear to auscultation without wheezing, rhonchi or rales. No increased work of breathing. CV: Normal S1/S2, no murmurs. Capillary refill <2 seconds.  Abdomen: Nondistended, nontender. No guarding or rebound tenderness. Bowel sounds present in all fields and hypoactive  Skin: Warm and dry, without lesions or rashes. Extremities:  No clubbing, cyanosis or edema. Neuro:   Grossly intact. No focal deficits appreciated. Responsive to questions.  Diagnostic studies:   Allergy Studies:   Full Food Panel: equivocal reaction to cucumber with adequate controls. Negative to Peanut, Soy, Wheat, Corn, Milk, Egg, Casein, Shellfish Mix, Fish Mix, Cashew, Tomato, Orange, Rice, [employer], [place], [place], [place], Chicken, [place] Potato, Banana, Apple, Peach, Sweet Potato, Green Pea, Strawberry, Onion, Cabbage, Carrots, Celery, [place] Gum, Acacia (Arabic Gum), Cottonseed, Flounder, Trout, Shrimp, [place], [place], [place], [place], [place], [place], [place], [place], Saccharomycese Cerevisiae, [place], [place], [place], [place], [place], [place], [place] nut, [place], [place], [place], Ginger, Garlic, Black Pepper, Mustard, [place], Oat, [place], Sesame and Pineapple     Salvatore Marvel, MD [facility]

## 2017-04-13 LAB — CP584 ZONE 3
Allergen, A. alternata, m6: <0.1 kU/L
Allergen, Black Locust, Acacia9: <0.1 kU/L
Allergen, C. Herbarum, M2: <0.1 kU/L
Allergen, Cedar tree, t12: <0.1 kU/L
Allergen, Comm Silver Birch, t9: <0.1 kU/L
Allergen, D pternoyssinus,d7: <0.1 kU/L
Allergen, Mucor Racemosus, M4: <0.1 kU/L
Allergen, Mulberry, t76: <0.1 kU/L
Allergen, Oak,t7: <0.1 kU/L
Allergen, P. notatum, m1: <0.1 kU/L
Allergen, S. Botryosum, m10: <0.1 kU/L
Aspergillus fumigatus, m3: <0.1 kU/L
Bahia Grass: <0.1 kU/L
Bermuda Grass: <0.1 kU/L
Box Elder IgE: <0.1 kU/L
Cat Dander: <0.1 kU/L
Cockroach: <0.1 kU/L
Common Ragweed: <0.1 kU/L
D. farinae: <0.1 kU/L
Dog Dander: <0.1 kU/L
Elm IgE: <0.1 kU/L
Johnson Grass: <0.1 kU/L
Meadow Grass: <0.1 kU/L
Nettle: <0.1 kU/L
Pecan/Hickory Tree IgE: <0.1 kU/L
Plantain: <0.1 kU/L
Rough Pigweed  IgE: <0.1 kU/L

## 2017-04-13 LAB — ALLERGEN HYMENOPTERA PANEL
Honey Bee IgE: <0.1 kU/L
Paper Wasp IgE: 0.1 kU/L — ABNORMAL HIGH
White Hornet IgE: <0.1 kU/L
Yellow Hornet IgE: <0.1 kU/L
Yellow Jacket IgE: <0.1 kU/L

## 2017-04-13 LAB — ALLERGY-SHELLFISH PANEL
Clams: <0.1 kU/L
Crab: <0.1 kU/L
Lobster: <0.1 kU/L
Shrimp IgE: <0.1 kU/L

## 2017-04-15 LAB — TRYPTASE: Tryptase: 8.1 ug/L (ref <11)

## 2017-04-27 ENCOUNTER — Ambulatory Visit (INDEPENDENT_AMBULATORY_CARE_PROVIDER_SITE_OTHER): Payer: Medicare HMO | Admitting: Family Medicine

## 2017-04-27 ENCOUNTER — Encounter: Payer: Self-pay | Admitting: Family Medicine

## 2017-04-27 VITALS — BP 120/68 | HR 88 | Temp 98.4°F | Resp 16 | Ht 60.0 in | Wt 165.2 lb

## 2017-04-27 DIAGNOSIS — I10 Essential (primary) hypertension: Secondary | ICD-10-CM | POA: Diagnosis not present

## 2017-04-27 DIAGNOSIS — Z23 Encounter for immunization: Secondary | ICD-10-CM

## 2017-04-27 DIAGNOSIS — E7800 Pure hypercholesterolemia, unspecified: Secondary | ICD-10-CM | POA: Diagnosis not present

## 2017-04-27 DIAGNOSIS — Z0000 Encounter for general adult medical examination without abnormal findings: Secondary | ICD-10-CM | POA: Diagnosis not present

## 2017-04-27 DIAGNOSIS — R7303 Prediabetes: Secondary | ICD-10-CM | POA: Diagnosis not present

## 2017-04-27 NOTE — Patient Instructions (Addendum)
Wellness with nurse in early October , call if you need me sooner  MD f/u in 5 months  Flu vaccine today  It is normal to be afraid of insects or things, continue to work through your phobia  Fasting lipd , cmp and EGFR ,hBA1C and vit D next week please   Thank you  for choosing [facility]. We consider it a privelige to serve you.  Delivering excellent health care in a caring and  compassionate way is our goal.  Partnering with you,  so that together we can achieve this goal is our strategy.

## 2017-04-29 ENCOUNTER — Encounter: Payer: Self-pay | Admitting: Family Medicine

## 2017-04-29 DIAGNOSIS — Z0000 Encounter for general adult medical examination without abnormal findings: Secondary | ICD-10-CM | POA: Insufficient documentation

## 2017-04-29 NOTE — Assessment & Plan Note (Signed)
Annual exam as documented. . Immunization and cancer screening needs are specifically addressed at this visit.  

## 2017-04-29 NOTE — Progress Notes (Signed)
    [name]     MRN: [id number]      DOB: 09/25/1933  HPI: Patient is in for annual physical exam. Thought that a lizard got into her house one day ago, was extremely terrified and is still very afraid. Neighbors pitched in to help however, still a lot of fear ,Immunization is   updated .   PE: BP 120/68 (BP Location: Left Arm, Patient Position: Sitting, Cuff Size: Normal)   Pulse 88   Temp 98.4 F (36.9 C) (Other (Comment))   Resp 16   Ht 5' (1.524 m)   Wt 165 lb 4 oz (75 kg)   SpO2 96%   BMI 32.27 kg/m   Pleasant  female, alert and oriented x 3, in no cardio-pulmonary distress. Afebrile. HEENT No facial trauma or asymetry. Sinuses non tender.  Extra occullar muscles intact, pupils equally reactive to light. External ears normal, tympanic membranes clear. Oropharynx moist, no exudate. Neck: supple, no adenopathy,JVD or thyromegaly.No bruits.  Chest: Clear to ascultation bilaterally.No crackles or wheezes. Non tender to palpation  Breast: No asymetry,no masses or lumps. No tenderness. No nipple discharge or inversion. No axillary or supraclavicular adenopathy  Cardiovascular system; Heart sounds normal,  S1 and  S2 ,no S3.  No murmur, or thrill. Apical beat not displaced Peripheral pulses normal.  Abdomen: Soft, non tender, no organomegaly or masses. No bruits. Bowel sounds normal. No guarding, tenderness or rebound.  Rectal:  Normal sphincter tone. No rectal mass. Guaiac negative stool.  GU: Not examined, not indicated.   Musculoskeletal exam: Decreased  ROM of spine, hips , shoulders and knees. No deformity ,swelling or crepitus noted. No muscle wasting or atrophy.   Neurologic: Cranial nerves 2 to 12 intact. Power, tone ,sensation and reflexes normal throughout. Abnormal , arthritic  gait. No tremor.  Skin: Intact, no ulceration, erythema , scaling or rash noted. Pigmentation normal throughout  Psych; Normal mood and affect.  Judgement and concentration normal   Assessment & Plan:  Annual physical exam Annual exam as documented.  Immunization and cancer screening needs are specifically addressed at this visit.

## 2017-05-02 ENCOUNTER — Other Ambulatory Visit: Payer: Self-pay

## 2017-05-02 ENCOUNTER — Telehealth: Payer: Self-pay | Admitting: Family Medicine

## 2017-05-02 DIAGNOSIS — G8929 Other chronic pain: Secondary | ICD-10-CM

## 2017-05-02 MED ORDER — HYDROCODONE-ACETAMINOPHEN 7.5-325 MG PO TABS: 1.0000 | ORAL_TABLET | Freq: Every day | ORAL | 0 refills | Status: DC | PRN

## 2017-05-02 NOTE — Telephone Encounter (Signed)
Yes can collect and needs a 12 week appt when she collects also need to print her narc script for me to reviewpls sched her 12 week appt , sorry!

## 2017-05-02 NOTE — Telephone Encounter (Signed)
Patient will come to collect scripts

## 2017-05-02 NOTE — Telephone Encounter (Signed)
Patient requesting hydrocodone for R knee pain.

## 2017-05-02 NOTE — Telephone Encounter (Signed)
Patient was here on 8/22 for CPE and scheduled to come back in 5 months. She did not get her pain med nor a 12 week follow up. Can she come collect?

## 2017-05-03 DIAGNOSIS — E7800 Pure hypercholesterolemia, unspecified: Secondary | ICD-10-CM | POA: Diagnosis not present

## 2017-05-03 DIAGNOSIS — Z23 Encounter for immunization: Secondary | ICD-10-CM | POA: Diagnosis not present

## 2017-05-03 DIAGNOSIS — R7303 Prediabetes: Secondary | ICD-10-CM | POA: Diagnosis not present

## 2017-05-03 DIAGNOSIS — I10 Essential (primary) hypertension: Secondary | ICD-10-CM | POA: Diagnosis not present

## 2017-05-03 LAB — COMPLETE METABOLIC PANEL WITH GFR
ALT: 17 U/L (ref 6–29)
AST: 26 U/L (ref 10–35)
Albumin: 3.9 g/dL (ref 3.6–5.1)
Alkaline Phosphatase: 87 U/L (ref 33–130)
BUN: 18 mg/dL (ref 7–25)
CO2: 29 mmol/L (ref 20–32)
Calcium: 9.1 mg/dL (ref 8.6–10.4)
Chloride: 103 mmol/L (ref 98–110)
Creat: 0.93 mg/dL — ABNORMAL HIGH (ref 0.60–0.88)
GFR, Est African American: 67 mL/min (ref >=60)
GFR, Est Non African American: 58 mL/min — ABNORMAL LOW (ref >=60)
Glucose, Bld: 86 mg/dL (ref 65–99)
Potassium: 4.5 mmol/L (ref 3.5–5.3)
Sodium: 139 mmol/L (ref 135–146)
Total Bilirubin: 0.5 mg/dL (ref 0.2–1.2)
Total Protein: 6.5 g/dL (ref 6.1–8.1)

## 2017-05-03 LAB — LIPID PANEL
Cholesterol: 202 mg/dL — ABNORMAL HIGH (ref <200)
HDL: 52 mg/dL (ref >50)
LDL Cholesterol: 116 mg/dL — ABNORMAL HIGH (ref <100)
Total CHOL/HDL Ratio: 3.9 Ratio (ref <5.0)
Triglycerides: 170 mg/dL — ABNORMAL HIGH (ref <150)
VLDL: 34 mg/dL — ABNORMAL HIGH (ref <30)

## 2017-05-04 LAB — HEMOGLOBIN A1C
Hgb A1c MFr Bld: 5.5 % (ref <5.7)
Mean Plasma Glucose: 111 mg/dL

## 2017-05-04 LAB — VITAMIN D 25 HYDROXY (VIT D DEFICIENCY, FRACTURES): Vit D, 25-Hydroxy: 40 ng/mL (ref 30–100)

## 2017-05-05 ENCOUNTER — Telehealth: Payer: Self-pay | Admitting: Family Medicine

## 2017-05-05 NOTE — Telephone Encounter (Signed)
Reggie Pile [employer], called regarding patient. Patient has had lab drawn for Vit D, and the following dx codes are not supportive of Vit D lab: I10 E78.00 R73.03 Z23  They are in need of a supporting dx code.  Callback# [phone]

## 2017-05-06 NOTE — Telephone Encounter (Signed)
wpould osteopenia work, or post menopause, os osteoparthritis, or low vit D, what DOES work is there any option, can they supply a list??

## 2017-05-06 NOTE — Telephone Encounter (Signed)
Left message with [name]. Per our conversation yesterday, they do not know off-hand what is covered by which diagnoses. I have dx codes: M85.80 osteopenia Z78 post menopause E55.9 low vitamin d M19.9 osteoarthritis   To try when she returns my call.

## 2017-05-16 ENCOUNTER — Telehealth: Payer: Self-pay | Admitting: Family Medicine

## 2017-05-16 NOTE — Telephone Encounter (Signed)
Patient is requesting a refill for hydrocodone because she fell and had to take more than what she was suppose to because she was in so much pain. She wants to know if she cant get a refill then what else can she take. Cb#: [phone]

## 2017-05-17 ENCOUNTER — Ambulatory Visit (INDEPENDENT_AMBULATORY_CARE_PROVIDER_SITE_OTHER): Payer: Medicare HMO | Admitting: Family Medicine

## 2017-05-17 ENCOUNTER — Encounter: Payer: Self-pay | Admitting: Family Medicine

## 2017-05-17 VITALS — BP 128/66 | HR 81 | Temp 97.9°F | Resp 18 | Ht 62.0 in | Wt 168.0 lb

## 2017-05-17 DIAGNOSIS — F40298 Other specified phobia: Secondary | ICD-10-CM | POA: Diagnosis not present

## 2017-05-17 DIAGNOSIS — M5441 Lumbago with sciatica, right side: Secondary | ICD-10-CM | POA: Diagnosis not present

## 2017-05-17 DIAGNOSIS — R69 Illness, unspecified: Secondary | ICD-10-CM | POA: Diagnosis not present

## 2017-05-17 DIAGNOSIS — F409 Phobic anxiety disorder, unspecified: Secondary | ICD-10-CM | POA: Insufficient documentation

## 2017-05-17 DIAGNOSIS — I10 Essential (primary) hypertension: Secondary | ICD-10-CM | POA: Diagnosis not present

## 2017-05-17 MED ORDER — PREDNISONE 5 MG (21) PO TBPK: 5.0000 mg | ORAL_TABLET | ORAL | 0 refills | Status: DC

## 2017-05-17 MED ORDER — KETOROLAC TROMETHAMINE 60 MG/2ML IM SOLN
60.0000 mg | Freq: Once | INTRAMUSCULAR | Status: AC
  Administered 2017-05-17: 60 mg via INTRAMUSCULAR

## 2017-05-17 MED ORDER — METHYLPREDNISOLONE ACETATE 80 MG/ML IJ SUSP
80.0000 mg | Freq: Once | INTRAMUSCULAR | Status: AC
  Administered 2017-05-17: 80 mg via INTRAMUSCULAR

## 2017-05-17 NOTE — Assessment & Plan Note (Addendum)
3 weeks ago felt that lizzard entered house , long h/o phobia for lizzards, mice and snakes , constant fear, wotrking through this , needs therapy, and wants this, will refer

## 2017-05-17 NOTE — Telephone Encounter (Signed)
I spoke directly with the pt , she is aware that she is to come in this morniong to be worked in for acute back pain

## 2017-05-17 NOTE — Assessment & Plan Note (Addendum)
Uncontrolled.Toradol and depo medrol administered IM in the office , to be followed by a short course of oral prednisone and NSAIDS. Refer to PT for management of back pain

## 2017-05-17 NOTE — Patient Instructions (Addendum)
F/u as before, call if you need me sooner  You are being treated for acute sciatica, toradol and depo medrol given in the office and prednisone dose pack sent to your pharmacy. yu are referred  For physical therapy as well as to psychologist  For treatment for phobia

## 2017-05-21 NOTE — Assessment & Plan Note (Signed)
Controlled, no change in medication  

## 2017-05-21 NOTE — Progress Notes (Signed)
   [name]     MRN: [id number]      DOB: 06-10-1934   HPI [name] is here with a 3 week h/o increased and uncontrolled low back pain radiating down right lower extremity after trying to escape a lizard in her home. Pain is constant and disabling, limiting her  movement  She denies incontinence of stool or urine, has established arthritis and disc disease and has had back surgery in the past an dis interested in therapy to help with her increased pain Also continues to experience significant anxiety and fear in her own home ever since she saw a lizard there , now requesting therapy as she realizes that her fear continues to be real and is disabling ROS Denies recent fever or chills. Denies sinus pressure, nasal congestion, ear pain or sore throat. Denies chest congestion, productive cough or wheezing. Denies chest pains, palpitations and leg swelling Denies abdominal pain, nausea, vomiting,diarrhea or constipation.   Denies dysuria, frequency, hesitancy or incontinence. Denies skin break down or rash.   PE  BP 128/66 (BP Location: Left Arm, Patient Position: Sitting, Cuff Size: Normal)   Pulse 81   Temp 97.9 F (36.6 C) (Other (Comment))   Resp 18   Ht 5\' 2"  (1.575 m)   Wt 168 lb (76.2 kg)   SpO2 95%   BMI 30.73 kg/m   Patient alert and oriented and in no cardiopulmonary distress. Patient in pain HEENT: No facial asymmetry, EOMI,   oropharynx pink and moist.  Neck supple no JVD, no mass.  Chest: Clear to auscultation bilaterally.  CVS: S1, S2 no murmurs, no S3.Regular rate.  ABD: Soft non tender.   Ext: No edema  MS: Decreased  ROM spine, adequate in  shoulders, hips and knees.  Skin: Intact, no ulcerations or rash noted.  Psych: Good eye contact, normal affect. Memory intact  anxious not depressed appearing.  CNS: CN 2-12 intact, power,  normal throughout.no focal deficits noted.   Assessment & Plan Acute right-sided low back pain with right-sided  sciatica Uncontrolled.Toradol and depo medrol administered IM in the office , to be followed by a short course of oral prednisone and NSAIDS. Refer to PT for management of back pain  Phobia 3 weeks ago felt that lizzard entered house , long h/o phobia for lizzards, mice and snakes , constant fear, wotrking through this , needs therapy, and wants this, will refer  Essential hypertension Controlled, no change in medication

## 2017-05-24 ENCOUNTER — Telehealth: Payer: Self-pay | Admitting: Family Medicine

## 2017-05-24 NOTE — Telephone Encounter (Signed)
Ava from [facility] called to inform that she followed up on referral for patient, but she does not want services.

## 2017-05-24 NOTE — Telephone Encounter (Signed)
Noted, pt had stated clearly at recent visit that she waned them!

## 2017-05-31 ENCOUNTER — Telehealth: Payer: Self-pay | Admitting: Family Medicine

## 2017-06-01 ENCOUNTER — Ambulatory Visit: Payer: Medicare HMO

## 2017-06-01 DIAGNOSIS — E039 Hypothyroidism, unspecified: Secondary | ICD-10-CM | POA: Diagnosis not present

## 2017-06-01 DIAGNOSIS — E785 Hyperlipidemia, unspecified: Secondary | ICD-10-CM | POA: Diagnosis not present

## 2017-06-01 DIAGNOSIS — I10 Essential (primary) hypertension: Secondary | ICD-10-CM | POA: Diagnosis not present

## 2017-06-01 DIAGNOSIS — D509 Iron deficiency anemia, unspecified: Secondary | ICD-10-CM | POA: Diagnosis not present

## 2017-06-01 DIAGNOSIS — Z6832 Body mass index (BMI) 32.0-32.9, adult: Secondary | ICD-10-CM | POA: Diagnosis not present

## 2017-06-01 DIAGNOSIS — R69 Illness, unspecified: Secondary | ICD-10-CM | POA: Diagnosis not present

## 2017-06-01 DIAGNOSIS — M25569 Pain in unspecified knee: Secondary | ICD-10-CM | POA: Diagnosis not present

## 2017-06-01 DIAGNOSIS — E669 Obesity, unspecified: Secondary | ICD-10-CM | POA: Diagnosis not present

## 2017-06-01 DIAGNOSIS — I25119 Atherosclerotic heart disease of native coronary artery with unspecified angina pectoris: Secondary | ICD-10-CM | POA: Diagnosis not present

## 2017-06-01 DIAGNOSIS — Z0000 Encounter for general adult medical examination without abnormal findings: Secondary | ICD-10-CM | POA: Diagnosis not present

## 2017-06-07 ENCOUNTER — Telehealth: Payer: Self-pay | Admitting: Family Medicine

## 2017-06-07 NOTE — Telephone Encounter (Signed)
noted 

## 2017-06-07 NOTE — Telephone Encounter (Signed)
Patient wants you to know she did not go to her Physical therapy appt because the front desk was unprofessional and she didn't like that. And she missed her orthopedic appt because she had car trouble. She plans on rescheduling the orthopedic  Cb# [phone]

## 2017-06-13 ENCOUNTER — Ambulatory Visit: Payer: Medicare HMO

## 2017-07-19 ENCOUNTER — Ambulatory Visit (INDEPENDENT_AMBULATORY_CARE_PROVIDER_SITE_OTHER): Payer: Medicare HMO | Admitting: Family Medicine

## 2017-07-19 ENCOUNTER — Other Ambulatory Visit: Payer: Self-pay

## 2017-07-19 ENCOUNTER — Encounter: Payer: Self-pay | Admitting: Family Medicine

## 2017-07-19 VITALS — BP 130/88 | HR 103 | Resp 16 | Ht 62.0 in | Wt 172.0 lb

## 2017-07-19 DIAGNOSIS — F321 Major depressive disorder, single episode, moderate: Secondary | ICD-10-CM | POA: Diagnosis not present

## 2017-07-19 DIAGNOSIS — R69 Illness, unspecified: Secondary | ICD-10-CM | POA: Diagnosis not present

## 2017-07-19 DIAGNOSIS — I10 Essential (primary) hypertension: Secondary | ICD-10-CM

## 2017-07-19 DIAGNOSIS — G8929 Other chronic pain: Secondary | ICD-10-CM | POA: Diagnosis not present

## 2017-07-19 DIAGNOSIS — M5441 Lumbago with sciatica, right side: Secondary | ICD-10-CM

## 2017-07-19 DIAGNOSIS — W19XXXS Unspecified fall, sequela: Secondary | ICD-10-CM | POA: Diagnosis not present

## 2017-07-19 MED ORDER — HYDROCODONE-ACETAMINOPHEN 7.5-325 MG PO TABS: 1.0000 | ORAL_TABLET | Freq: Every day | ORAL | 0 refills | Status: DC | PRN

## 2017-07-19 MED ORDER — METHYLPREDNISOLONE ACETATE 80 MG/ML IJ SUSP
80.0000 mg | Freq: Once | INTRAMUSCULAR | Status: AC
  Administered 2017-07-19: 80 mg via INTRAMUSCULAR

## 2017-07-19 MED ORDER — POTASSIUM CHLORIDE ER 10 MEQ PO TBCR: 10.0000 meq | EXTENDED_RELEASE_TABLET | Freq: Two times a day (BID) | ORAL | 1 refills | Status: DC

## 2017-07-19 MED ORDER — PREDNISONE 5 MG PO TABS: 5.0000 mg | ORAL_TABLET | Freq: Two times a day (BID) | ORAL | 0 refills | Status: AC

## 2017-07-19 MED ORDER — AMLODIPINE BESYLATE 10 MG PO TABS: 10.0000 mg | ORAL_TABLET | Freq: Every day | ORAL | 1 refills | Status: DC

## 2017-07-19 MED ORDER — KETOROLAC TROMETHAMINE 60 MG/2ML IM SOLN
60.0000 mg | Freq: Once | INTRAMUSCULAR | Status: AC
  Administered 2017-07-19: 60 mg via INTRAMUSCULAR

## 2017-07-19 NOTE — Patient Instructions (Addendum)
F/u in 11 weeks, call if  You need me sooner  Toradol and depo medrol in office  Hope you  Feel better   Please call back when you return for referral to a different place for therapy

## 2017-07-20 ENCOUNTER — Other Ambulatory Visit: Payer: Self-pay

## 2017-07-20 ENCOUNTER — Encounter: Payer: Self-pay | Admitting: Family Medicine

## 2017-07-20 DIAGNOSIS — W19XXXA Unspecified fall, initial encounter: Secondary | ICD-10-CM | POA: Insufficient documentation

## 2017-07-20 MED ORDER — NITROGLYCERIN 0.4 MG/SPRAY TL SOLN: 1.0000 | 1 refills | Status: DC | PRN

## 2017-07-20 NOTE — Assessment & Plan Note (Signed)
Uncontrolled.Toradol and depo medrol administered IM in the office , to be followed by a short course of oral prednisone   

## 2017-07-20 NOTE — Assessment & Plan Note (Signed)
Pt with t fall history and unsteady gait, most recent 1 week ago, necessitating this visit for management of acute uncontrolled pain in lower back and RLE. Able to weight bear and ambulate with cane, plans f/u with neurosurgeon in the next 1 to 2 weeks Needs physical therapy for increased safety, due to gait instability for severe spine and knee arthritis, also fall history. Will refer to a different facility or for in ho,me help on her  Return to  , she is to call

## 2017-07-20 NOTE — Progress Notes (Signed)
[name]     MRN: [id number]      DOB: [birth year]   HPI [name] is here because of increased low back pain radiating down right hip and leg following a fall 1 week ago, pain is rated at a 10. She intends to travel to [place] in the next week and will set up an appointment with her neurosurgeon who has previously operated on her lumbar spine for evaluation as she feels she has suffered severe injury Is needing to use pain medication regularly as a result, denies new numbness or incontinence of stool or urine C/o increased emotional stress in the past several weeks also, she was referred for therapy for her chronic back pian and she states that when she realized that the office was the same one where her Mother had been to emotionally she broke down and was unable to go there, states she is willing to get therapy but not at that facility  ROS:   Denies recent fever or chills. Denies sinus pressure, nasal congestion, ear pain or sore throat. Denies chest congestion, productive cough or wheezing. Denies chest pains, palpitations and leg swelling Denies abdominal pain, nausea, vomiting,diarrhea or constipation.   Denies dysuria, frequency, hesitancy or incontinence.  Denies headaches, seizures, numbness, or tingling.  Denies skin break down or rash.   PE  BP 130/88   Pulse (!) 103   Resp 16   Ht 5\' 2"  (1.575 m)   Wt 172 lb (78 kg)   SpO2 95%   BMI 31.46 kg/m   Patient alert and oriented and in no cardiopulmonary distress. Tearful and in pain,  Wearing a back brace HEENT: No facial asymmetry, EOMI,   oropharynx pink and moist.  Neck supple no JVD, no mass.  Chest: Clear to auscultation bilaterally.  CVS: S1, S2 no murmurs, no S3.Regular rate.  ABD: Soft non tender.   Ext: No edema  MS: Markedly reduced  ROM lumbar  spine,  Normal in shoulders, decreased in  hips and knees.  Skin: Intact, no ulcerations or rash noted.  Psych: Good eye contact, normal  affect. Memory intact, tearful  anxious  And  depressed appearing.  CNS: CN 2-12 intact, power,  normal throughout.no focal deficits noted.   Assessment & Plan  Acute right-sided low back pain with right-sided sciatica Uncontrolled.Toradol and depo medrol administered IM in the office , to be followed by a short course of oral prednisone  '  Fall Pt with t fall history and unsteady gait, most recent 1 week ago, necessitating this visit for management of acute uncontrolled pain in lower back and RLE. Able to weight bear and ambulate with cane, plans f/u with neurosurgeon in the next 1 to 2 weeks Needs physical therapy for increased safety, due to gait instability for severe spine and knee arthritis, also fall history. Will refer to a different facility or for in ho,me help on her  Return to [place] , she is to call  Depression Increased in past 4 weeks due to flashbacks experienced when she was referred for  PT to a facility where her Mother had been treated in the past, working through this and will be treated at a different place  Essential hypertension Controlled, no change in medication DASH diet and commitment to daily physical activity for a minimum of 30 minutes discussed and encouraged, as a part of hypertension management. The importance of attaining a healthy weight is also discussed.  BP/Weight 07/19/2017 05/17/2017 04/27/2017 04/12/2017 01/27/2017 10/17/2016  6/92/2300  Systolic BP 979 499 718 209 906 893 -  Diastolic BP 88 66 68 75 84 89 -  Wt. (Lbs) 172 168 165.25 168.2 163 - 170  BMI 31.46 30.73 32.27 33.02 29.81 - 31.09

## 2017-07-20 NOTE — Assessment & Plan Note (Signed)
Increased in past 4 weeks due to flashbacks experienced when she was referred for  PT to a facility where her Mother had been treated in the past, working through this and will be treated at a different place

## 2017-07-20 NOTE — Assessment & Plan Note (Signed)
Controlled, no change in medication DASH diet and commitment to daily physical activity for a minimum of 30 minutes discussed and encouraged, as a part of hypertension management. The importance of attaining a healthy weight is also discussed.  BP/Weight 07/19/2017 05/17/2017 04/27/2017 04/12/2017 01/27/2017 10/17/2016 6/92/4932  Systolic BP 419 914 445 848 350 757 -  Diastolic BP 88 66 68 75 84 89 -  Wt. (Lbs) 172 168 165.25 168.2 163 - 170  BMI 31.46 30.73 32.27 33.02 29.81 - 31.09

## 2017-08-03 DIAGNOSIS — M5136 Other intervertebral disc degeneration, lumbar region: Secondary | ICD-10-CM | POA: Diagnosis not present

## 2017-08-03 DIAGNOSIS — M545 Low back pain: Secondary | ICD-10-CM | POA: Diagnosis not present

## 2017-08-25 DIAGNOSIS — M25569 Pain in unspecified knee: Secondary | ICD-10-CM

## 2017-08-29 ENCOUNTER — Telehealth: Payer: Self-pay | Admitting: Family Medicine

## 2017-08-29 NOTE — Telephone Encounter (Signed)
Pt Stopped by to pick up Hydrocodone, and paperwork, I gave the paperwork, but there was no RX, Advised pt Dr Moshe Cipro would be back in on the 31st

## 2017-09-01 NOTE — Telephone Encounter (Signed)
Patient had drooped the 3 rxs off at the pharmacy and has gotten it

## 2017-09-09 ENCOUNTER — Emergency Department (HOSPITAL_COMMUNITY)
Admission: EM | Admit: 2017-09-09 | Discharge: 2017-09-09 | Disposition: A | Discharge status: Home / Self Care | Payer: Medicare HMO | Attending: Emergency Medicine | Admitting: Emergency Medicine

## 2017-09-09 ENCOUNTER — Encounter (HOSPITAL_COMMUNITY): Payer: Self-pay | Admitting: Emergency Medicine

## 2017-09-09 ENCOUNTER — Other Ambulatory Visit: Payer: Self-pay

## 2017-09-09 ENCOUNTER — Emergency Department (HOSPITAL_COMMUNITY): Payer: Medicare HMO

## 2017-09-09 DIAGNOSIS — I2510 Atherosclerotic heart disease of native coronary artery without angina pectoris: Secondary | ICD-10-CM | POA: Insufficient documentation

## 2017-09-09 DIAGNOSIS — Z7982 Long term (current) use of aspirin: Secondary | ICD-10-CM | POA: Diagnosis not present

## 2017-09-09 DIAGNOSIS — M5441 Lumbago with sciatica, right side: Secondary | ICD-10-CM | POA: Diagnosis not present

## 2017-09-09 DIAGNOSIS — I10 Essential (primary) hypertension: Secondary | ICD-10-CM | POA: Insufficient documentation

## 2017-09-09 DIAGNOSIS — E039 Hypothyroidism, unspecified: Secondary | ICD-10-CM | POA: Diagnosis not present

## 2017-09-09 DIAGNOSIS — S299XXA Unspecified injury of thorax, initial encounter: Secondary | ICD-10-CM | POA: Diagnosis not present

## 2017-09-09 DIAGNOSIS — R0781 Pleurodynia: Secondary | ICD-10-CM | POA: Diagnosis not present

## 2017-09-09 DIAGNOSIS — N390 Urinary tract infection, site not specified: Secondary | ICD-10-CM | POA: Diagnosis not present

## 2017-09-09 DIAGNOSIS — M546 Pain in thoracic spine: Secondary | ICD-10-CM | POA: Diagnosis not present

## 2017-09-09 DIAGNOSIS — M545 Low back pain: Secondary | ICD-10-CM | POA: Diagnosis present

## 2017-09-09 DIAGNOSIS — G8929 Other chronic pain: Secondary | ICD-10-CM | POA: Insufficient documentation

## 2017-09-09 DIAGNOSIS — M5440 Lumbago with sciatica, unspecified side: Secondary | ICD-10-CM | POA: Diagnosis not present

## 2017-09-09 DIAGNOSIS — Z87891 Personal history of nicotine dependence: Secondary | ICD-10-CM | POA: Diagnosis not present

## 2017-09-09 DIAGNOSIS — Z79899 Other long term (current) drug therapy: Secondary | ICD-10-CM | POA: Diagnosis not present

## 2017-09-09 DIAGNOSIS — M5442 Lumbago with sciatica, left side: Secondary | ICD-10-CM | POA: Diagnosis not present

## 2017-09-09 LAB — URINALYSIS, ROUTINE W REFLEX MICROSCOPIC
Bilirubin Urine: NEGATIVE
Glucose, UA: NEGATIVE mg/dL
Ketones, ur: 5 mg/dL — AB
Nitrite: POSITIVE — AB
Protein, ur: 30 mg/dL — AB
Specific Gravity, Urine: 1.017 (ref 1.005–1.030)
pH: 5 (ref 5.0–8.0)

## 2017-09-09 MED ORDER — ONDANSETRON HCL 4 MG PO TABS
4.0000 mg | ORAL_TABLET | Freq: Once | ORAL | Status: AC
  Administered 2017-09-09: 4 mg via ORAL
  Filled 2017-09-09: qty 1

## 2017-09-09 MED ORDER — OXYCODONE-ACETAMINOPHEN 5-325 MG PO TABS
1.0000 | ORAL_TABLET | Freq: Once | ORAL | Status: AC
  Administered 2017-09-09: 1 via ORAL
  Filled 2017-09-09: qty 1

## 2017-09-09 MED ORDER — OXYCODONE-ACETAMINOPHEN 5-325 MG PO TABS: 1.0000 | ORAL_TABLET | Freq: Four times a day (QID) | ORAL | 0 refills | Status: DC | PRN

## 2017-09-09 MED ORDER — NITROFURANTOIN MONOHYD MACRO 100 MG PO CAPS: 100.0000 mg | ORAL_CAPSULE | Freq: Two times a day (BID) | ORAL | 0 refills | Status: DC

## 2017-09-09 MED ORDER — NITROFURANTOIN MONOHYD MACRO 100 MG PO CAPS
100.0000 mg | ORAL_CAPSULE | Freq: Once | ORAL | Status: AC
  Administered 2017-09-09: 100 mg via ORAL
  Filled 2017-09-09: qty 1

## 2017-09-09 NOTE — ED Triage Notes (Addendum)
Pt reports lower back pain for last several weeks. Pt reports back pain result of fall several months ago. Pt reports is currently taking prescription medication but reports is not helping pain. Pt denies gi/gu symptoms.  Pt reports was seen by doctor recently and started on prednisone with minimal relief.

## 2017-09-09 NOTE — Discharge Instructions (Signed)
The x-ray of your back shows that her surgical hardware is in place.  There are no fractures appreciated.  The x-ray of your ribs and lungs show no acute problems.  Your urine test does show a urinary tract infection.  Please use Macrobid 2 times daily with food.  Please have your urine rechecked in about 7-10 days to ensure that the infection had resolved.  Please continue your prednisone as previously ordered.  Use Tylenol extra strength for mild pain.  Use Percocet for more severe pain.  Percocet may cause drowsiness, lightheadedness, and/or constipation.  You may need to use a stool softener if you take the Percocet.  Please use caution taking this medication.

## 2017-09-09 NOTE — ED Provider Notes (Signed)
[facility] EMERGENCY DEPARTMENT Provider Note   CSN: [id number] Arrival date & time: 09/09/17  1121     History   Chief Complaint Chief Complaint  Patient presents with  . Back Pain    HPI [name] is a 84 y.o. female.  Patient is an 84 year old female who presents to the emergency department with a complaint of increasing back pain.  The patient states that her pain is been going on for several weeks.  She has a history of chronic back problems.  She has had surgery on her lower back area as well as her thoracic area.  The patient denies any dysuria or increased urine frequency.  She has not had any recent operations or procedures.  She has had some falls, but not recently.  No high fever reported.  No vomiting or diarrhea reported.  The patient has been seen by her primary physician as well as a neurology specialist and has been placed on prednisone.  She states that this is not helping.  She presents now for assistance with this issue.   The history is provided by the patient.    Past Medical History:  Diagnosis Date  . Anemia   . Blood clot associated with vein wall inflammation    hx of in right leg  . CAD (coronary artery disease)   . Carpal tunnel syndrome on left    Left Hand pain with numbness, new onset   . Chronic back pain   . Chronic back pain    buldging disc  . Constipation   . Diarrhea   . Difficult intubation    small airway  . DJD (degenerative joint disease)    Severe  . Gastric ulcer    hx of   . GERD (gastroesophageal reflux disease)    takes Omeprazole daily  . Headache(784.0)    occasionally  . Hyperlipidemia    takes Lipitor nightly  . Hypertension    takes Amlodipine and Metoprolol daily  . Nocturia   . Obesity   . Pneumonia    hx of-in 2011  . Shortness of breath    with exertion  . Sleep apnea    sleep study done 89yrs ago  . Thyroid mass    right    Patient Active Problem List   Diagnosis Date Noted  . Fall  07/20/2017  . Acute right-sided low back pain with right-sided sciatica 05/17/2017  . Phobia 05/17/2017  . Seafood allergy 01/31/2017  . Obesity (BMI 30.0-34.9) 10/12/2016  . Encounter for chronic pain management 12/07/2015  . CAD in native artery   . Right knee pain 04/17/2013  . Coronary vasospasm ([place]) 02/07/2013  . Depression 12/19/2012  . Hypothyroid 08/14/2012  . Prediabetes 10/10/2011  . CARPAL TUNNEL SYNDROME, BILATERAL 02/19/2010  . GERD 09/15/2009  . Coronary atherosclerosis 10/01/2008  . DYSPEPSIA 07/07/2008  . BACK PAIN WITH RADICULOPATHY 07/03/2008  . Hyperlipidemia 11/24/2007  . Essential hypertension 11/24/2007    Past Surgical History:  Procedure Laterality Date  . ABDOMINAL HYSTERECTOMY    . APPENDECTOMY  1987  . Arthoscopy left knee  1992  . Arthroscopy left shoulder  1999  . Back  Surgery  lumbar  1989 /1999   x 2  . BACK SURGERY    . CARDIAC CATHETERIZATION  05/2010  . CARDIAC CATHETERIZATION N/A 09/30/2015   Procedure: Left Heart Cath and Coronary Angiography;  Surgeon: Troy Sine, MD;  Location: [facility];  Service: Cardiovascular;  Laterality: N/A;  .  Carpal tunnel release right     x 2  . CHOLECYSTECTOMY    . COLONOSCOPY    . ESOPHAGOGASTRODUODENOSCOPY    . LUMBAR FUSION  March 19, 2014  . NM MYOCAR PERF WALL MOTION  11/12/2008   no ischemia  . PARTIAL HYSTERECTOMY  1972  . ROTATOR CUFF REPAIR     right  . SPINE SURGERY    . THYROIDECTOMY  11/17/2011   Procedure: THYROIDECTOMY;  Surgeon: Ascencion Dike, MD;  Location: [facility];  Service: ENT;  Laterality: Right;  WITH FROZEN SECTION  . TONSILLECTOMY      OB History    No data available       Home Medications    Prior to Admission medications   Medication Sig Start Date End Date Taking? Authorizing Provider  amLODipine (NORVASC) 10 MG tablet Take 1 tablet (10 mg total) daily by mouth. 07/19/17   Fayrene Helper, MD  Ascorbic Acid (VITAMIN C) 500 MG tablet Take 500 mg by mouth  daily.     [provider]  aspirin EC 81 MG tablet Take 81 mg by mouth daily.    [provider]  atorvastatin (LIPITOR) 20 MG tablet Take 1 tablet (20 mg total) by mouth daily. 01/31/17   Fayrene Helper, MD  calcium carbonate (OS-CAL - DOSED IN MG OF ELEMENTAL CALCIUM) 1250 MG tablet Take 1 tablet by mouth daily.    [provider]  Cyanocobalamin (B-12) 1000 MCG CAPS Take 1 capsule by mouth daily.    [provider]  diclofenac sodium (VOLTAREN) 1 % GEL Apply 1 application topically 4 (four) times daily. Patient taking differently: Apply 2 g topically 2 (two) times daily as needed (for back pain).  02/28/12   Fayrene Helper, MD  EPIPEN 2-PAK 0.3 MG/0.3ML SOAJ injection inject as directed for SEVERE ALLERGIC REACTIONS 01/26/17   Fayrene Helper, MD  ferrous sulfate 325 (65 FE) MG tablet Take 325 mg by mouth daily with breakfast.     [provider]  FLUoxetine (PROZAC) 20 MG tablet take 1 and 1/2 tablet by mouth once daily 04/07/16   Fayrene Helper, MD  fluticasone [facility]) 50 MCG/ACT nasal spray Place 1 spray into both nostrils daily. 05/22/15   Fayrene Helper, MD  hydrochlorothiazide (MICROZIDE) 12.5 MG capsule take 1 capsule by mouth once daily 10/21/14   Fayrene Helper, MD  HYDROcodone-acetaminophen [facility]) 7.5-325 MG tablet Take 1 tablet daily as needed by mouth for moderate pain. 07/19/17   Fayrene Helper, MD  isosorbide mononitrate (IMDUR) 30 MG 24 hr tablet Take 1 tablet (30 mg total) by mouth daily. 01/21/17   Croitoru, Mihai, MD  levothyroxine (SYNTHROID, LEVOTHROID) 25 MCG tablet take 1 tablet every morning 01/03/17   Fayrene Helper, MD  Multiple Vitamin (MULITIVITAMIN WITH MINERALS) TABS Take 1 tablet by mouth daily.    [provider]  nitroGLYCERIN (NITROLINGUAL) 0.4 MG/SPRAY spray Place 1 spray every 5 (five) minutes x 3 doses as needed under the tongue for chest pain. 07/20/17   Croitoru, Mihai, MD    potassium chloride (K-DUR) 10 MEQ tablet Take 1 tablet (10 mEq total) 2 (two) times daily by mouth. 07/19/17   Fayrene Helper, MD    Family History Family History  Problem Relation Age of Onset  . Anuerysm Mother        died at 69  . Stroke Father        2 at death   .  Anesthesia problems Neg Hx   . Hypotension Neg Hx   . Malignant hyperthermia Neg Hx   . Pseudochol deficiency Neg Hx   . Allergic rhinitis Neg Hx   . Angioedema Neg Hx   . Asthma Neg Hx   . Atopy Neg Hx   . Eczema Neg Hx   . Immunodeficiency Neg Hx   . Urticaria Neg Hx     Social History Social History   Tobacco Use  . Smoking status: Former Smoker    Types: Cigarettes    Last attempt to quit: 09/06/1984    Years since quitting: 33.0  . Smokeless tobacco: Never Used  Substance Use Topics  . Alcohol use: No    Comment: Casual  . Drug use: No     Allergies   Ace inhibitors; Bupropion hcl; Codeine; Penicillins; Shellfish allergy; Angiotensin receptor blockers; and Bee venom   Review of Systems Review of Systems  Constitutional: Negative for activity change.       All ROS Neg except as noted in HPI  HENT: Negative for nosebleeds.   Eyes: Negative for photophobia and discharge.  Respiratory: Negative for cough, shortness of breath and wheezing.   Cardiovascular: Negative for chest pain and palpitations.  Gastrointestinal: Negative for abdominal pain and blood in stool.  Genitourinary: Negative for dysuria, frequency and hematuria.  Musculoskeletal: Positive for arthralgias and back pain. Negative for neck pain.  Skin: Negative.   Neurological: Negative for dizziness, seizures and speech difficulty.  Psychiatric/Behavioral: Negative for confusion and hallucinations.     Physical Exam Updated Vital Signs BP (!) 154/93 (BP Location: Right Arm)   Pulse 92   Temp 98.2 F (36.8 C) (Oral)   Resp 18   Ht 5' (1.524 m)   Wt 74.8 kg (165 lb)   SpO2 97%   BMI 32.22 kg/m   Physical Exam   Constitutional: She is oriented to person, place, and time. She appears well-developed and well-nourished.  Non-toxic appearance.  HENT:  Head: Normocephalic.  Right Ear: Tympanic membrane and external ear normal.  Left Ear: Tympanic membrane and external ear normal.  Eyes: EOM and lids are normal. Pupils are equal, round, and reactive to light.  Neck: Normal range of motion. Neck supple. Carotid bruit is not present.  Cardiovascular: Normal rate, regular rhythm, normal heart sounds, intact distal pulses and normal pulses.  Pulmonary/Chest: Breath sounds normal. No respiratory distress. She exhibits tenderness.  There is symmetrical rise and fall of the chest.  Patient speaks in complete sentences without problem.    Abdominal: Soft. Bowel sounds are normal. There is no tenderness. There is no guarding.  Musculoskeletal:       Lumbar back: She exhibits decreased range of motion and pain.  There is pain of the lower back with change of position and with palpation.  No hot areas appreciated.  Need to  Lymphadenopathy:       Head (right side): No submandibular adenopathy present.       Head (left side): No submandibular adenopathy present.    She has no cervical adenopathy.  Neurological: She is alert and oriented to person, place, and time. She has normal strength. No cranial nerve deficit or sensory deficit.  Skin: Skin is warm and dry.  Psychiatric: She has a normal mood and affect. Her speech is normal.  Nursing note and vitals reviewed.    ED Treatments / Results  Labs (all labs ordered are listed, but only abnormal results are displayed) Labs Reviewed  URINALYSIS,  ROUTINE W REFLEX MICROSCOPIC    EKG  EKG Interpretation None       Radiology No results found.  Procedures Procedures (including critical care time)  Medications Ordered in ED Medications - No data to display   Initial Impression / Assessment and Plan / ED Course  I have reviewed the triage vital  signs and the nursing notes.  Pertinent labs & imaging results that were available during my care of the patient were reviewed by me and considered in my medical decision making (see chart for details).       Final Clinical Impressions(s) / ED Diagnoses MDM  blood pressure slightly elevated, otherwise vital signs are within normal limits.  Pulse oximetry is 97% on room air.  Within normal limits by my interpretation.  The patient has pain at the base of the right rib area extending to the flank and to the back.  Will obtain rib films and lung films.  The patient has pain in the lower back area will obtain x-rays of the back.  We will also obtain a urine specimen.  Urine analysis shows a hazy amber specimen.  The leukocyte esterase is moderate.  The nitrates are positive.  Too many to count white blood cells present. The rib films and chest x-ray are negative for acute problem.  The lumbar films show the surgical hardware to be in place.  There is no compression fracture.  There is no evidence of a dislocation.  The patient is asked to continue her prednisone as previously ordered.  Patient will use Tylenol for mild pain, she will use Percocet for more severe pain.  A culture of the urine has been sent to the lab.  Patient will use Macrobid twice daily.  Patient is to follow-up with Dr. Moshe Cipro.  She will return to the emergency department if any emergent changes, problems, or concerns.   Final diagnoses:  Urinary tract infection without hematuria, site unspecified  Chronic bilateral low back pain with sciatica, sciatica laterality unspecified    ED Discharge Orders        Ordered    nitrofurantoin, macrocrystal-monohydrate, (MACROBID) 100 MG capsule  2 times daily     09/09/17 1427    oxyCODONE-acetaminophen (PERCOCET/ROXICET) 5-325 MG tablet  Every 6 hours PRN     09/09/17 1427       Lily Kocher, PA-C 09/09/17 1503    Davonna Belling, MD 09/12/17 1152

## 2017-09-13 DIAGNOSIS — G8929 Other chronic pain: Secondary | ICD-10-CM | POA: Diagnosis not present

## 2017-09-13 DIAGNOSIS — I252 Old myocardial infarction: Secondary | ICD-10-CM | POA: Diagnosis not present

## 2017-09-13 DIAGNOSIS — Z885 Allergy status to narcotic agent status: Secondary | ICD-10-CM | POA: Diagnosis not present

## 2017-09-13 DIAGNOSIS — I10 Essential (primary) hypertension: Secondary | ICD-10-CM | POA: Diagnosis not present

## 2017-09-13 DIAGNOSIS — Z91013 Allergy to seafood: Secondary | ICD-10-CM | POA: Diagnosis not present

## 2017-09-13 DIAGNOSIS — R109 Unspecified abdominal pain: Secondary | ICD-10-CM | POA: Diagnosis not present

## 2017-09-13 DIAGNOSIS — Z87891 Personal history of nicotine dependence: Secondary | ICD-10-CM | POA: Diagnosis not present

## 2017-09-13 DIAGNOSIS — M5136 Other intervertebral disc degeneration, lumbar region: Secondary | ICD-10-CM | POA: Diagnosis not present

## 2017-09-13 DIAGNOSIS — M545 Low back pain: Secondary | ICD-10-CM | POA: Diagnosis not present

## 2017-09-13 DIAGNOSIS — N390 Urinary tract infection, site not specified: Secondary | ICD-10-CM | POA: Diagnosis not present

## 2017-09-13 DIAGNOSIS — Z792 Long term (current) use of antibiotics: Secondary | ICD-10-CM | POA: Diagnosis not present

## 2017-09-13 DIAGNOSIS — Z880 Allergy status to penicillin: Secondary | ICD-10-CM | POA: Diagnosis not present

## 2017-09-13 LAB — URINE CULTURE
Culture: >=100000 — AB
Special Requests: NORMAL

## 2017-09-14 ENCOUNTER — Telehealth: Payer: Self-pay | Admitting: Emergency Medicine

## 2017-09-14 NOTE — Telephone Encounter (Signed)
Post ED Visit - Positive Culture Follow-up  Culture report reviewed by antimicrobial stewardship pharmacist:  []  Elenor Quinones, Pharm.D. []  Heide Guile, Pharm.D., BCPS AQ-ID []  Parks Neptune, Pharm.D., BCPS []  Alycia Rossetti, Pharm.D., BCPS []  [place], Pharm.D., BCPS, AAHIVP []  Legrand Como, Pharm.D., BCPS, AAHIVP []  Salome Arnt, PharmD, BCPS []  Jalene Mullet, PharmD []  Vincenza Hews, PharmD, BCPS  Positive urine culture Treated with nitrofurantoin, organism sensitive to the same and no further patient follow-up is required at this time.  Hazle Nordmann 09/14/2017, 2:27 PM

## 2017-09-14 NOTE — Progress Notes (Signed)
ED Antimicrobial Stewardship Positive Culture Follow Up   [name] is an 84 y.o. female who presented to [facility] on 09/09/2017 with a chief complaint of  Chief Complaint  Patient presents with  . Back Pain    Recent Results (from the past 720 hour(s))  Urine culture     Status: Abnormal   Collection Time: 09/09/17 12:10 PM  Result Value Ref Range Status   Specimen Description URINE, CLEAN CATCH  Final   Special Requests Normal  Final   Culture >=100,000 COLONIES/mL KLEBSIELLA PNEUMONIAE (A)  Final   Report Status 09/13/2017 FINAL  Final   Organism ID, Bacteria KLEBSIELLA PNEUMONIAE (A)  Final      Susceptibility   Klebsiella pneumoniae - MIC*    AMPICILLIN >=32 RESISTANT Resistant     CEFAZOLIN <=4 SENSITIVE Sensitive     CEFTRIAXONE <=1 SENSITIVE Sensitive     CIPROFLOXACIN <=0.25 SENSITIVE Sensitive     GENTAMICIN <=1 SENSITIVE Sensitive     IMIPENEM <=0.25 SENSITIVE Sensitive     NITROFURANTOIN 64 INTERMEDIATE Intermediate     TRIMETH/SULFA <=20 SENSITIVE Sensitive     AMPICILLIN/SULBACTAM 4 SENSITIVE Sensitive     PIP/TAZO <=4 SENSITIVE Sensitive     Extended ESBL NEGATIVE Sensitive     * >=100,000 COLONIES/mL KLEBSIELLA PNEUMONIAE     New antibiotic prescription: finish current prescribed therapy, no further treatment needed.   ED Provider: Alferd Apa, PA-C   Jalene Mullet, Pharm.D. PGY1 Pharmacy Resident 09/14/2017 9:35 AM Main Pharmacy: [phone] Phone# [phone]

## 2017-09-16 ENCOUNTER — Telehealth: Payer: Self-pay

## 2017-09-16 NOTE — Telephone Encounter (Signed)
Message left recommending use of daily stool softeners,OTC colace two daily, and primarily magnesium citrate as the OTC laxative to use in this case. Mention was made of dulcolax tab;let and /or suppository also , however the magnesium citrate is the prfereed agent, tends to be more potent I also recommended urgent care or ED eval if no BM through the next 2 days, abdominal distension, pain or vomiting

## 2017-09-16 NOTE — Telephone Encounter (Signed)
Daughter [name] called requesting a call from you. She has taken her mother back to her home in [place] because she's been so sick. Now she has nothad a bowel movement since Sunday and has taken Miralax the past 2 days with no relief. Having stomach cramps as well. She wants to know what you recommend she take. Wants it left on voicemail because she is at work [phone]

## 2017-09-17 ENCOUNTER — Telehealth: Payer: Self-pay | Admitting: Family Medicine

## 2017-09-17 NOTE — Telephone Encounter (Signed)
Direct contact made with pt and her daughter. She was re evaluated up [place], had been on nitrofurantoin for UTI which was resistant to, now on ? Keflex, seems to be doing better. Has had BM with Mg citrate. Has appt on 1/21, Daughter concerned about memory, needs MMS at visit  Please document in EMR at her next visit her MMSE as a par of her vital signs , thank you

## 2017-09-19 NOTE — Telephone Encounter (Signed)
I spoke to the,m on Saturday, When pt comes for a visit, PLEASE enter in her electronic record a MMSE as a part of her vital sign, daughter concerned about memory loss

## 2017-09-19 NOTE — Telephone Encounter (Signed)
Noted  

## 2017-09-19 NOTE — Telephone Encounter (Signed)
Done

## 2017-09-26 ENCOUNTER — Encounter: Payer: Self-pay | Admitting: Family Medicine

## 2017-09-26 ENCOUNTER — Ambulatory Visit (INDEPENDENT_AMBULATORY_CARE_PROVIDER_SITE_OTHER): Payer: Medicare HMO | Admitting: Family Medicine

## 2017-09-26 VITALS — BP 118/82 | HR 89 | Resp 16 | Ht 62.0 in | Wt 165.0 lb

## 2017-09-26 DIAGNOSIS — M25561 Pain in right knee: Secondary | ICD-10-CM | POA: Diagnosis not present

## 2017-09-26 DIAGNOSIS — I10 Essential (primary) hypertension: Secondary | ICD-10-CM

## 2017-09-26 DIAGNOSIS — N3001 Acute cystitis with hematuria: Secondary | ICD-10-CM | POA: Diagnosis not present

## 2017-09-26 DIAGNOSIS — M5441 Lumbago with sciatica, right side: Secondary | ICD-10-CM

## 2017-09-26 DIAGNOSIS — G8929 Other chronic pain: Secondary | ICD-10-CM | POA: Diagnosis not present

## 2017-09-26 DIAGNOSIS — E7800 Pure hypercholesterolemia, unspecified: Secondary | ICD-10-CM | POA: Diagnosis not present

## 2017-09-26 DIAGNOSIS — Z09 Encounter for follow-up examination after completed treatment for conditions other than malignant neoplasm: Secondary | ICD-10-CM | POA: Diagnosis not present

## 2017-09-26 LAB — POCT URINALYSIS DIPSTICK
Appearance: NORMAL
Glucose, UA: NEGATIVE
Leukocytes, UA: NEGATIVE
Nitrite, UA: NEGATIVE
Odor: NORMAL
Protein, UA: 30
Spec Grav, UA: >=1.03 — AB (ref 1.010–1.025)
Urobilinogen, UA: 0.2 E.U./dL
pH, UA: 5.5 (ref 5.0–8.0)

## 2017-09-26 MED ORDER — LEVOTHYROXINE SODIUM 25 MCG PO TABS: 25.0000 ug | ORAL_TABLET | Freq: Every morning | ORAL | 1 refills | Status: DC

## 2017-09-26 MED ORDER — PREDNISONE 20 MG PO TABS: ORAL_TABLET | ORAL | 0 refills | Status: DC

## 2017-09-26 MED ORDER — FLUTICASONE PROPIONATE 50 MCG/ACT NA SUSP: 1.0000 | Freq: Every day | NASAL | 3 refills | Status: DC

## 2017-09-26 NOTE — Patient Instructions (Addendum)
F/u in February as before for chronic pain management    You are being referred for epidural injection  Preferably on  Friday pm, daughter Phylis [phone]  (cell) home message on home [phone]   Also will be  Notified , also you are being referred to pain management  In [place]   Five days of prednisone are sent to your pharmacy

## 2017-09-27 ENCOUNTER — Ambulatory Visit: Payer: Medicare HMO | Admitting: Family Medicine

## 2017-09-29 DIAGNOSIS — N3001 Acute cystitis with hematuria: Secondary | ICD-10-CM | POA: Insufficient documentation

## 2017-09-29 NOTE — Assessment & Plan Note (Signed)
Repeat cCUA in office shows dehydration with trace blood, no infection

## 2017-09-29 NOTE — Assessment & Plan Note (Signed)
Severe arthritis in knee, with instability, surgical option not being considered byu h the patient or her family at this time

## 2017-09-29 NOTE — Progress Notes (Signed)
[name]     MRN: [id number]      DOB: 1933/12/29   HPI [name] is here for follow up of ED visit for UTI initially in [place] and then another in [place] for a urinary tract infection, which was initially treated with an antibiotic which unfortunately she was not sensitive to. Her condition deteriorated and her daughter [name], who accompanies her today and who lives in [place], came to  [place] and took her mother back with her and had her re assessed and she was treated with Keflex and nursed back to health for 1 week in her home Two main concerns today are uncontrolled and increasingly debilitating back pain radiating to RLE. Pt has already had spine surgery 3 times and is mortally terrified of any and all surgery as is her daughter for her.. She is interested in an epidural injections and being seen by a pian specialist. The other concern is of memory loss, of minor things, as patient is noted to be repeating herself often, she has not been known , and does not leave stove on, and that is not a current concern, nor does she get lost. MMSE in the office today is nearly totally normal which is reassuring to patient and her daughter. I absolutely believe that because of increased age and failing health the time is approaching for [name] to live closer to one of her responsible children and stop driving on the [place] [address] [place] the road and maintaining 3 residences, she becomes tearful, daughter present states she will  Relocate to be near her mother if needed, but she herself is still working Slight frequency , needs urine re checked  ROS Denies recent fever or chills. Denies sinus pressure, nasal congestion, ear pain or sore throat. Denies chest congestion, productive cough or wheezing. Denies chest pains, palpitations and leg swelling Denies abdominal pain, nausea, vomiting,diarrhea or constipation.   Denies dysuria, frequency, hesitancy or incontinence.  Denies headaches,  seizures, c.o right lower extremity  Numbness,and  tingling. C/o mild  Depression and  anxiety or insomnia. Denies skin break down or rash.   PE  BP 118/82   Pulse 89   Resp 16   Ht 5\' 2"  (1.575 m)   Wt 165 lb (74.8 kg)   SpO2 96%   BMI 30.18 kg/m   Patient alert and oriented and in no cardiopulmonary distress.  HEENT: No facial asymmetry, EOMI,   oropharynx pink and moist.  Neck supple no JVD, no mass.  Chest: Clear to auscultation bilaterally.  CVS: S1, S2 no murmurs, no S3.Regular rate.  ABD: Soft non tender.   Ext: No edema  MS: Markedly decreased  ROM lumbar  Spine, ,right  hip and knee. Decreased in shoulder  Skin: Intact, no ulcerations or rash noted.  Psych: Good eye contact, normal affect. Memory intact not anxious easily tearful , mildly depressed appearing.  CNS: CN 2-12 intact, power,  normal throughout.no focal deficits noted.   Assessment & Plan  Acute right-sided low back pain with right-sided sciatica Uncontrolled acute on chronic pain which is worsening. 5 day course of oral prednisone prescribed and she is referred for epidural as well as to a pian clinic for evaluation as surgery is not an option at this time and patient reports being miserable in pain in her current situation  Essential hypertension Controlled, no change in medication   Right knee pain Severe arthritis in knee, with instability, surgical option not being considered byu h the patient  or her family at this time  Hyperlipidemia Controlled, no change in medication   Acute cystitis with hematuria Repeat cCUA in office shows dehydration with trace blood, no infection  Encounter for examination following treatment at hospital ED visit reviewed patient's urine culture report reviewed also Currently urinary infection is cleared. Plan to re examine urine at furture appt as there is a trace of blood Uncontrolled and debilitating back pain are the biggest issues currently

## 2017-09-29 NOTE — Assessment & Plan Note (Signed)
Uncontrolled acute on chronic pain which is worsening. 5 day course of oral prednisone prescribed and she is referred for epidural as well as to a pian clinic for evaluation as surgery is not an option at this time and patient reports being miserable in pain in her current situation

## 2017-09-29 NOTE — Assessment & Plan Note (Signed)
Controlled, no change in medication  

## 2017-09-29 NOTE — Assessment & Plan Note (Signed)
ED visit reviewed patient's urine culture report reviewed also Currently urinary infection is cleared. Plan to re examine urine at furture appt as there is a trace of blood Uncontrolled and debilitating back pain are the biggest issues currently

## 2017-09-30 ENCOUNTER — Other Ambulatory Visit: Payer: Self-pay | Admitting: Family Medicine

## 2017-09-30 DIAGNOSIS — G8929 Other chronic pain: Secondary | ICD-10-CM

## 2017-09-30 NOTE — Telephone Encounter (Signed)
Pt calling in for RX for Hydrocodone, I advised that I would send a note, However Dr Moshe Cipro was out of the office til tues.,, [name] is aware, and couldn't complete the request.

## 2017-10-04 ENCOUNTER — Telehealth: Payer: Self-pay | Admitting: Family Medicine

## 2017-10-04 NOTE — Telephone Encounter (Signed)
Pt is calling in to advise that the Dr that performed her Back Surgery is going to do her epidural on Monday 2-4.Marland Kitchen She expressed that she couldn't thank you enough.Marland KitchenMarland Kitchen

## 2017-10-04 NOTE — Telephone Encounter (Signed)
Reached out to patients daughter today to discuss referral to pain dr.'s  Patient said she would call me back yesterday but I never received a call.

## 2017-10-04 NOTE — Addendum Note (Signed)
Addended by: Merceda Elks on: 10/04/2017 11:47 AM   Modules accepted: Orders

## 2017-10-05 NOTE — Telephone Encounter (Signed)
noted 

## 2017-10-10 DIAGNOSIS — M545 Low back pain: Secondary | ICD-10-CM | POA: Diagnosis not present

## 2017-10-10 DIAGNOSIS — M5416 Radiculopathy, lumbar region: Secondary | ICD-10-CM | POA: Diagnosis not present

## 2017-10-10 DIAGNOSIS — M5136 Other intervertebral disc degeneration, lumbar region: Secondary | ICD-10-CM | POA: Diagnosis not present

## 2017-10-11 DIAGNOSIS — Z7982 Long term (current) use of aspirin: Secondary | ICD-10-CM | POA: Diagnosis not present

## 2017-10-11 DIAGNOSIS — E079 Disorder of thyroid, unspecified: Secondary | ICD-10-CM | POA: Diagnosis not present

## 2017-10-11 DIAGNOSIS — I10 Essential (primary) hypertension: Secondary | ICD-10-CM | POA: Diagnosis not present

## 2017-10-11 DIAGNOSIS — N3000 Acute cystitis without hematuria: Secondary | ICD-10-CM | POA: Diagnosis not present

## 2017-10-11 DIAGNOSIS — E7800 Pure hypercholesterolemia, unspecified: Secondary | ICD-10-CM | POA: Diagnosis not present

## 2017-10-11 DIAGNOSIS — I252 Old myocardial infarction: Secondary | ICD-10-CM | POA: Diagnosis not present

## 2017-10-11 DIAGNOSIS — G8929 Other chronic pain: Secondary | ICD-10-CM | POA: Diagnosis not present

## 2017-10-11 DIAGNOSIS — Z79899 Other long term (current) drug therapy: Secondary | ICD-10-CM | POA: Diagnosis not present

## 2017-10-11 DIAGNOSIS — Z87891 Personal history of nicotine dependence: Secondary | ICD-10-CM | POA: Diagnosis not present

## 2017-10-11 DIAGNOSIS — M545 Low back pain: Secondary | ICD-10-CM | POA: Diagnosis not present

## 2017-10-12 DIAGNOSIS — Z7982 Long term (current) use of aspirin: Secondary | ICD-10-CM | POA: Diagnosis not present

## 2017-10-12 DIAGNOSIS — E7800 Pure hypercholesterolemia, unspecified: Secondary | ICD-10-CM | POA: Diagnosis not present

## 2017-10-12 DIAGNOSIS — I10 Essential (primary) hypertension: Secondary | ICD-10-CM | POA: Diagnosis not present

## 2017-10-12 DIAGNOSIS — G8929 Other chronic pain: Secondary | ICD-10-CM | POA: Diagnosis not present

## 2017-10-12 DIAGNOSIS — I252 Old myocardial infarction: Secondary | ICD-10-CM | POA: Diagnosis not present

## 2017-10-12 DIAGNOSIS — Z79899 Other long term (current) drug therapy: Secondary | ICD-10-CM | POA: Diagnosis not present

## 2017-10-12 DIAGNOSIS — N3000 Acute cystitis without hematuria: Secondary | ICD-10-CM | POA: Diagnosis not present

## 2017-10-12 DIAGNOSIS — E079 Disorder of thyroid, unspecified: Secondary | ICD-10-CM | POA: Diagnosis not present

## 2017-10-12 DIAGNOSIS — Z87891 Personal history of nicotine dependence: Secondary | ICD-10-CM | POA: Diagnosis not present

## 2017-10-12 DIAGNOSIS — M545 Low back pain: Secondary | ICD-10-CM | POA: Diagnosis not present

## 2017-10-17 ENCOUNTER — Ambulatory Visit: Payer: Medicare HMO | Admitting: Family Medicine

## 2017-10-25 ENCOUNTER — Telehealth: Payer: Self-pay | Admitting: Family Medicine

## 2017-10-25 NOTE — Telephone Encounter (Signed)
Patient called in to ask if she had pain medication ready for pickup. I checked the basket & with brandi, patient missed her followup for pain management on 10/17/17. She said she was unaware of the appointment and that she was in [place] with her daughter because she was sick.  I also brought up that I have not heard back from anyone regarding her pain management referral since 10/04/17

## 2017-10-26 ENCOUNTER — Telehealth: Payer: Self-pay

## 2017-10-26 NOTE — Telephone Encounter (Signed)
Pt is calling requesting Hydrocodone.  I told her you would not be able to address this until Monday.

## 2017-10-27 NOTE — Telephone Encounter (Signed)
Patient missed her pain management appointment with our office.

## 2017-10-27 NOTE — Telephone Encounter (Signed)
Scheduled a work in Monday pm

## 2017-10-31 ENCOUNTER — Ambulatory Visit: Payer: Medicare HMO | Admitting: Family Medicine

## 2017-11-07 ENCOUNTER — Encounter: Payer: Self-pay | Admitting: Family Medicine

## 2017-11-07 ENCOUNTER — Ambulatory Visit (INDEPENDENT_AMBULATORY_CARE_PROVIDER_SITE_OTHER): Payer: Medicare HMO | Admitting: Family Medicine

## 2017-11-07 VITALS — BP 130/84 | HR 93 | Temp 97.6°F | Ht 62.0 in | Wt 164.8 lb

## 2017-11-07 DIAGNOSIS — R300 Dysuria: Secondary | ICD-10-CM

## 2017-11-07 LAB — POCT URINALYSIS DIPSTICK
Bilirubin, UA: NEGATIVE
Glucose, UA: NEGATIVE
Nitrite, UA: POSITIVE
Protein, UA: NEGATIVE
Spec Grav, UA: 1.025 (ref 1.010–1.025)
Urobilinogen, UA: 0.2 E.U./dL
pH, UA: 5.5 (ref 5.0–8.0)

## 2017-11-07 MED ORDER — SULFAMETHOXAZOLE-TRIMETHOPRIM 800-160 MG PO TABS: 1.0000 | ORAL_TABLET | Freq: Two times a day (BID) | ORAL | 0 refills | Status: DC

## 2017-11-07 NOTE — Progress Notes (Signed)
Chief Complaint  Patient presents with  . Urinary Tract Infection    frequently, back hurting    Patient states that she has had urinary tract infections in the past.  She had one in January.  She was treated with a few days of Macrodantin.  It felt like it got better, then the dysuria came back.  She is having frequency and dysuria.  It is worse some days than others.  No fever or chills.  No nausea or vomiting.  No flank pain.  No history of kidney stones or kidney infections.  She has chronic low back pain.  It is not in the area of her kidneys.  It is unchanged  Patient Active Problem List   Diagnosis Date Noted  . Acute cystitis with hematuria 09/29/2017  . Fall 07/20/2017  . Acute right-sided low back pain with right-sided sciatica 05/17/2017  . Phobia 05/17/2017  . Seafood allergy 01/31/2017  . Obesity (BMI 30.0-34.9) 10/12/2016  . Encounter for examination following treatment at hospital 12/07/2015  . CAD in native artery   . Right knee pain 04/17/2013  . Coronary vasospasm ([place]) 02/07/2013  . Depression 12/19/2012  . Hypothyroid 08/14/2012  . Prediabetes 10/10/2011  . CARPAL TUNNEL SYNDROME, BILATERAL 02/19/2010  . GERD 09/15/2009  . Coronary atherosclerosis 10/01/2008  . DYSPEPSIA 07/07/2008  . BACK PAIN WITH RADICULOPATHY 07/03/2008  . Hyperlipidemia 11/24/2007  . Essential hypertension 11/24/2007    Outpatient Encounter Medications as of 11/07/2017  Medication Sig  . amLODipine (NORVASC) 10 MG tablet Take 1 tablet (10 mg total) daily by mouth.  . Ascorbic Acid (VITAMIN C) 500 MG tablet Take 500 mg by mouth daily.   Marland Kitchen aspirin EC 81 MG tablet Take 81 mg by mouth daily.  Marland Kitchen atorvastatin (LIPITOR) 20 MG tablet Take 1 tablet (20 mg total) by mouth daily.  . calcium carbonate (OS-CAL - DOSED IN MG OF ELEMENTAL CALCIUM) 1250 MG tablet Take 1 tablet by mouth daily.  . Cyanocobalamin (B-12) 1000 MCG CAPS Take 1 capsule by mouth daily.  . diclofenac sodium (VOLTAREN) 1 %  GEL Apply 1 application topically 4 (four) times daily. (Patient taking differently: Apply 2 g topically 2 (two) times daily as needed (for back pain). )  . EPIPEN 2-PAK 0.3 MG/0.3ML SOAJ injection inject as directed for SEVERE ALLERGIC REACTIONS  . ferrous sulfate 325 (65 FE) MG tablet Take 325 mg by mouth daily with breakfast.   . FLUoxetine (PROZAC) 20 MG tablet take 1 and 1/2 tablet by mouth once daily  . fluticasone (FLONASE) 50 MCG/ACT nasal spray Place 1 spray into both nostrils daily.  . hydrochlorothiazide (MICROZIDE) 12.5 MG capsule take 1 capsule by mouth once daily  . HYDROcodone-acetaminophen (NORCO) 7.5-325 MG tablet Take 1 tablet daily as needed by mouth for moderate pain.  . isosorbide mononitrate (IMDUR) 30 MG 24 hr tablet Take 1 tablet (30 mg total) by mouth daily.  Marland Kitchen levothyroxine (SYNTHROID, LEVOTHROID) 25 MCG tablet Take 1 tablet (25 mcg total) by mouth every morning.  . Multiple Vitamin (MULITIVITAMIN WITH MINERALS) TABS Take 1 tablet by mouth daily.  . nitroGLYCERIN (NITROLINGUAL) 0.4 MG/SPRAY spray Place 1 spray every 5 (five) minutes x 3 doses as needed under the tongue for chest pain.  Marland Kitchen oxyCODONE-acetaminophen (PERCOCET/ROXICET) 5-325 MG tablet Take 1 tablet by mouth every 6 (six) hours as needed.  . potassium chloride (K-DUR) 10 MEQ tablet Take 1 tablet (10 mEq total) 2 (two) times daily by mouth.  . [DISCONTINUED] nitrofurantoin,  macrocrystal-monohydrate, (MACROBID) 100 MG capsule Take 1 capsule (100 mg total) by mouth 2 (two) times daily.  . predniSONE (DELTASONE) 20 MG tablet One tablet twice daily for 5 days (Patient not taking: Reported on 11/07/2017)  . sulfamethoxazole-trimethoprim (BACTRIM DS,SEPTRA DS) 800-160 MG tablet Take 1 tablet by mouth 2 (two) times daily.   No facility-administered encounter medications on file as of 11/07/2017.     Allergies  Allergen Reactions  . Ace Inhibitors Anaphylaxis    Was on ventilator for six days due to airway swelling shut  while on ACE inhibitors.  . Bupropion Hcl Anaphylaxis  . Codeine Anaphylaxis  . Penicillins Anaphylaxis    Has patient had a PCN reaction causing immediate rash, facial/tongue/throat swelling, SOB or lightheadedness with hypotension: Yes Has patient had a PCN reaction causing severe rash involving mucus membranes or skin necrosis: No Has patient had a PCN reaction that required hospitalization No Has patient had a PCN reaction occurring within the last 10 years: No If all of the above answers are "NO", then may proceed with Cephalosporin use.   . Shellfish Allergy Anaphylaxis    Tongue swells up, Oysters OK  . Angiotensin Receptor Blockers Other (See Comments)    Patient unable to recall what medication this might have been and/or her reaction to it.  . Bee Venom     Facial swelling      Review of Systems  Constitutional: Negative for activity change, appetite change and unexpected weight change.  HENT: Negative for congestion.   Eyes: Negative for redness and visual disturbance.  Respiratory: Negative for cough and shortness of breath.   Cardiovascular: Negative for chest pain and palpitations.  Gastrointestinal: Negative for abdominal pain, nausea and vomiting.  Genitourinary: Positive for dysuria and frequency. Negative for difficulty urinating and flank pain.  Musculoskeletal: Positive for back pain. Negative for arthralgias.       Chronic back pain  Neurological: Negative for dizziness and headaches.  Psychiatric/Behavioral: Negative for dysphoric mood and sleep disturbance. The patient is not nervous/anxious.     BP 130/84 (BP Location: Right Arm, Patient Position: Sitting, Cuff Size: Normal)   Pulse 93   Temp 97.6 F (36.4 C) (Temporal)   Ht 5\' 2"  (1.575 m)   Wt 164 lb 12 oz (74.7 kg)   SpO2 98%   BMI 30.13 kg/m   Physical Exam  Constitutional: She is oriented to person, place, and time. She appears well-developed and well-nourished.  Pleasant elderly woman.   Appears younger than stated age.  HENT:  Head: Normocephalic and atraumatic.  Mouth/Throat: Oropharynx is clear and moist.  Eyes: Conjunctivae are normal. Pupils are equal, round, and reactive to light.  Neck: Normal range of motion.  Cardiovascular: Normal rate, regular rhythm and normal heart sounds.  Pulmonary/Chest: Effort normal and breath sounds normal.  Abdominal:  No abdominal tenderness.  No CVAT  Lymphadenopathy:    She has no cervical adenopathy.  Neurological: She is alert and oriented to person, place, and time.  Skin: Skin is warm.  Psychiatric: She has a normal mood and affect. Her behavior is normal.    ASSESSMENT/PLAN:  1. Dysuria  Results for orders placed or performed in visit on 11/07/17  POCT Urinalysis Dipstick  Result Value Ref Range   Color, UA yellow    Clarity, UA cloudy    Glucose, UA neg    Bilirubin, UA neg    Ketones, UA trace    Spec Grav, UA 1.025 1.010 - 1.025   Blood,  UA trace-intact    pH, UA 5.5 5.0 - 8.0   Protein, UA neg    Urobilinogen, UA 0.2 0.2 or 1.0 E.U./dL   Nitrite, UA pos    Leukocytes, UA Small (1+) (A) Negative   Appearance     Odor      - POCT Urinalysis Dipstick - Urine Culture   Patient Instructions  Take the septra antibiotic as directed Twice a day for 7 days We have done a culture of the urine I will send you a letter with your test results.  If there is anything of concern, we will call right away. Drink enough water to keep your urine light yellow Report any problems    Raylene Everts, MD

## 2017-11-07 NOTE — Patient Instructions (Signed)
Take the septra antibiotic as directed Twice a day for 7 days We have done a culture of the urine I will send you a letter with your test results.  If there is anything of concern, we will call right away. Drink enough water to keep your urine light yellow Report any problems

## 2017-11-09 LAB — URINE CULTURE
MICRO NUMBER:: 90276898
SPECIMEN QUALITY:: ADEQUATE

## 2017-11-10 ENCOUNTER — Telehealth: Payer: Self-pay

## 2017-11-10 NOTE — Telephone Encounter (Signed)
Called patient and advised of lab results. Patient stated that she is still hurting some, worse at night and early in the morning, but after she takes her medication states that she does feel better. No other concerns at this time.

## 2017-11-10 NOTE — Telephone Encounter (Signed)
-----   Message from Raylene Everts, MD sent at 11/10/2017  8:33 AM EST ----- Please call [name].  Let her know that the culture results have been reviewed, and that she is on the correct antibiotic.  Inquire whether she feels well.

## 2017-11-14 ENCOUNTER — Ambulatory Visit (INDEPENDENT_AMBULATORY_CARE_PROVIDER_SITE_OTHER): Payer: Medicare HMO | Admitting: Family Medicine

## 2017-11-14 ENCOUNTER — Encounter: Payer: Self-pay | Admitting: Family Medicine

## 2017-11-14 VITALS — BP 150/94 | HR 100 | Resp 16 | Ht 62.0 in | Wt 162.0 lb

## 2017-11-14 DIAGNOSIS — E7800 Pure hypercholesterolemia, unspecified: Secondary | ICD-10-CM

## 2017-11-14 DIAGNOSIS — E890 Postprocedural hypothyroidism: Secondary | ICD-10-CM

## 2017-11-14 DIAGNOSIS — G8929 Other chronic pain: Secondary | ICD-10-CM

## 2017-11-14 DIAGNOSIS — R1032 Left lower quadrant pain: Secondary | ICD-10-CM

## 2017-11-14 DIAGNOSIS — I10 Essential (primary) hypertension: Secondary | ICD-10-CM | POA: Diagnosis not present

## 2017-11-14 DIAGNOSIS — Z1231 Encounter for screening mammogram for malignant neoplasm of breast: Secondary | ICD-10-CM

## 2017-11-14 DIAGNOSIS — R7303 Prediabetes: Secondary | ICD-10-CM

## 2017-11-14 LAB — POCT URINALYSIS DIPSTICK
Appearance: NORMAL
Bilirubin, UA: NEGATIVE
Blood, UA: NEGATIVE
Glucose, UA: NEGATIVE
Ketones, UA: NEGATIVE
Leukocytes, UA: NEGATIVE
Nitrite, UA: NEGATIVE
Odor: NORMAL
Protein, UA: NEGATIVE
Spec Grav, UA: 1.01 (ref 1.010–1.025)
Urobilinogen, UA: 0.2 E.U./dL
pH, UA: 6 (ref 5.0–8.0)

## 2017-11-14 MED ORDER — HYDROCODONE-ACETAMINOPHEN 7.5-325 MG PO TABS: 1.0000 | ORAL_TABLET | Freq: Every day | ORAL | 0 refills | Status: DC | PRN

## 2017-11-14 NOTE — Progress Notes (Signed)
Fill Date ID Written Drug Qty Days Prescriber Rx # Pharmacy Refill Daily Dose * Pymt Type PMP  10/27/2017  1  10/10/2017  Tramadol Hcl 50 Mg Tablet  30 7 Ri Mca  [id number] [facility] ([id number])  0 21.43 MME Comm Ins  [place]  08/29/2017  1  07/19/2017  Hydrocodone-Acetamin 7.5-325  30 30 Ma Sim  [id number] [facility] ([id number])  0 7.50 MME Medicare  [place]  07/19/2017  1  07/19/2017  Hydrocodone-Acetamin 7.5-325  30 30 Ma Sim  [id number] [facility] ([id number])  0 7.50 MME Medicare  [facility]  06/08/2017  1  05/02/2017  Hydrocodone-Acetamin 7.5-325  30 30 Ma Sim  [id number] [facility] ([id number])  0 7.50 MME Medicare  [place]  05/02/2017  1  05/02/2017  Hydrocodone-Acetamin 7.5-325  30 30 Ma Sim  [id number] [facility] ([id number])  0 7.50 MME Comm Ins  [place]  04/09/2017  1  01/27/2017  Hydrocodone-Acetamin 7.5-325  30 30 Ma Sim  [id number] [facility] ([id number])  0 7.50 MME Comm Ins  [place]  03/01/2017  1  01/27/2017  Hydrocodone-Acetamin 7.5-325  30 30 Ma Sim  [id number] [facility] ([id number])  0 7.50 MME Comm Ins  [place]  01/27/2017  1  01/27/2017  Hydrocodone-Acetamin 7.5-325  30 30 Ma Sim  [id number] [facility] ([id number])  0 7.50 MME Comm Ins  [place]  12/05/2016  1  09/28/2016  Hydrocodone-Acetamin 7.5-325  30 30 Ma Sim  [id number] [facility] ([id number])  0 7.50 MME Comm Ins  [place]  11/03/2016  1  09/28/2016  Hydrocodone-Acetamin 7.5-325             Reviewed at visit

## 2017-11-14 NOTE — Patient Instructions (Addendum)
Wellness with nurse oast due  Please schedule appt for mammogram at checkout , past due   MD appt in 11 weeks  Fasting lipid, cmp and eGFr , hBA1c and tSH in next week please  Your urine infection is treated , the left groin pain is from the pinched nerve in  Your low back, and the arthritis.   PLEASE TAKE your blood pressure medications every days as prescribed , your blood pressure is high

## 2017-11-18 ENCOUNTER — Telehealth: Payer: Self-pay | Admitting: Family Medicine

## 2017-11-18 NOTE — Telephone Encounter (Signed)
Advised per the last AVS that the pain was a pinched nerve and arthritis.

## 2017-11-21 ENCOUNTER — Encounter: Payer: Self-pay | Admitting: Family Medicine

## 2017-11-21 DIAGNOSIS — R1032 Left lower quadrant pain: Secondary | ICD-10-CM | POA: Insufficient documentation

## 2017-11-21 NOTE — Progress Notes (Signed)
   [name]     MRN: [id number]      DOB: 20-Oct-1933   HPI [name] is here for follow up and re-evaluation of chronic medical conditions, medication management and review of any available recent lab and radiology data.  Preventive health is updated, specifically  Cancer screening and Immunization.   Still c/o LLQ pain despite being treated recently for uTI and s concerned that she still has one Denies dysuria or frequency, no fever or chills, denies N/V , change in stool, loose stool or bloody stool LLQ pain radiates to spine, and she has established sever spine disease  ROS Denies recent fever or chills. Denies sinus pressure, nasal congestion, ear pain or sore throat. Denies chest congestion, productive cough or wheezing. Denies chest pains, palpitations and leg swelling    Denies dysuria, frequency, hesitancy or incontinence.  Denies headaches, seizures, numbness, or tingling. Denies depression, anxiety or insomnia. Denies skin break down or rash.   PE  BP (!) 150/94   Pulse 100   Resp 16   Ht 5\' 2"  (1.575 m)   Wt 162 lb (73.5 kg)   SpO2 97%   BMI 29.63 kg/m   Patient alert and oriented and in no cardiopulmonary distress.  HEENT: No facial asymmetry, EOMI,   oropharynx pink and moist.  Neck supple no JVD, no mass.  Chest: Clear to auscultation bilaterally.  CVS: S1, S2 no murmurs, no S3.Regular rate.  ABD: Soft no localized tenderness, guarding or rebound  Ext: No edema  MS: Decreased  ROM spine, adequate in shoulders, hips and knees.  Skin: Intact, no ulcerations or rash noted.  Psych: Good eye contact, normal affect. Memory intact not anxious or depressed appearing.  CNS: CN 2-12 intact, power,  normal throughout.no focal deficits noted.   Assessment & Plan  LLQ pain Chronic LLQ pain , pt concerned from residual UTI. CCUA  Normal Pain is radiating from lumbar spine , I explained this  Essential hypertension Un Controlled, no change in  medication, reports missing medication accidentally, also being under increased stress due to disagreement with her grandson who she is now taking to court   Hyperlipidemia Hyperlipidemia:Low fat diet discussed and encouraged.   Lipid Panel  Lab Results  Component Value Date   CHOL 202 (H) 05/03/2017   HDL 52 05/03/2017   LDLCALC 116 (H) 05/03/2017   TRIG 170 (H) 05/03/2017   CHOLHDL 3.9 05/03/2017  uncontrolled and elevated, needs to reduce fat intake     Encounter for chronic pain management The patient's Controlled Substance registry is reviewed and compliance confirmed. Adequacy of  Pain control and level of function is assessed. Medication dosing is adjusted as deemed appropriate. Twelve weeks of medication is prescribed , patient signs for the script and is provided with a follow up appointment between 11 to 12 weeks .

## 2017-11-21 NOTE — Assessment & Plan Note (Signed)
Hyperlipidemia:Low fat diet discussed and encouraged.   Lipid Panel  Lab Results  Component Value Date   CHOL 202 (H) 05/03/2017   HDL 52 05/03/2017   LDLCALC 116 (H) 05/03/2017   TRIG 170 (H) 05/03/2017   CHOLHDL 3.9 05/03/2017  uncontrolled and elevated, needs to reduce fat intake

## 2017-11-21 NOTE — Assessment & Plan Note (Signed)
The patient's Controlled Substance registry is reviewed and compliance confirmed. Adequacy of  Pain control and level of function is assessed. Medication dosing is adjusted as deemed appropriate. Twelve weeks of medication is prescribed , patient signs for the script and is provided with a follow up appointment between 11 to 12 weeks .  

## 2017-11-21 NOTE — Assessment & Plan Note (Addendum)
Un Controlled, no change in medication, reports missing medication accidentally, also being under increased stress due to disagreement with her grandson who she is now taking to court

## 2017-11-21 NOTE — Assessment & Plan Note (Signed)
Chronic LLQ pain , pt concerned from residual UTI. CCUA  Normal Pain is radiating from lumbar spine , I explained this

## 2017-11-24 ENCOUNTER — Encounter (HOSPITAL_COMMUNITY): Payer: Self-pay | Admitting: Emergency Medicine

## 2017-11-24 ENCOUNTER — Other Ambulatory Visit: Payer: Self-pay

## 2017-11-24 ENCOUNTER — Telehealth: Payer: Self-pay | Admitting: Family Medicine

## 2017-11-24 ENCOUNTER — Emergency Department (HOSPITAL_COMMUNITY): Payer: Medicare HMO

## 2017-11-24 ENCOUNTER — Emergency Department (HOSPITAL_COMMUNITY)
Admission: EM | Admit: 2017-11-24 | Discharge: 2017-11-24 | Disposition: A | Discharge status: Home / Self Care | Payer: Medicare HMO | Attending: Emergency Medicine | Admitting: Emergency Medicine

## 2017-11-24 DIAGNOSIS — M1612 Unilateral primary osteoarthritis, left hip: Secondary | ICD-10-CM | POA: Insufficient documentation

## 2017-11-24 DIAGNOSIS — Z79899 Other long term (current) drug therapy: Secondary | ICD-10-CM | POA: Diagnosis not present

## 2017-11-24 DIAGNOSIS — Z7982 Long term (current) use of aspirin: Secondary | ICD-10-CM | POA: Insufficient documentation

## 2017-11-24 DIAGNOSIS — R911 Solitary pulmonary nodule: Secondary | ICD-10-CM

## 2017-11-24 DIAGNOSIS — K5730 Diverticulosis of large intestine without perforation or abscess without bleeding: Secondary | ICD-10-CM | POA: Diagnosis not present

## 2017-11-24 DIAGNOSIS — Z87891 Personal history of nicotine dependence: Secondary | ICD-10-CM | POA: Diagnosis not present

## 2017-11-24 DIAGNOSIS — I10 Essential (primary) hypertension: Secondary | ICD-10-CM | POA: Insufficient documentation

## 2017-11-24 DIAGNOSIS — R200 Anesthesia of skin: Secondary | ICD-10-CM | POA: Diagnosis not present

## 2017-11-24 DIAGNOSIS — I2510 Atherosclerotic heart disease of native coronary artery without angina pectoris: Secondary | ICD-10-CM | POA: Insufficient documentation

## 2017-11-24 DIAGNOSIS — R1032 Left lower quadrant pain: Secondary | ICD-10-CM | POA: Diagnosis present

## 2017-11-24 LAB — CBC
HCT: 41 % (ref 36.0–46.0)
Hemoglobin: 12.9 g/dL (ref 12.0–15.0)
MCH: 28.5 pg (ref 26.0–34.0)
MCHC: 31.5 g/dL (ref 30.0–36.0)
MCV: 90.7 fL (ref 78.0–100.0)
Platelets: 286 K/uL (ref 150–400)
RBC: 4.52 MIL/uL (ref 3.87–5.11)
RDW: 15.2 % (ref 11.5–15.5)
WBC: 5.2 K/uL (ref 4.0–10.5)

## 2017-11-24 LAB — COMPREHENSIVE METABOLIC PANEL
ALT: 24 U/L (ref 14–54)
AST: 31 U/L (ref 15–41)
Albumin: 3.9 g/dL (ref 3.5–5.0)
Alkaline Phosphatase: 67 U/L (ref 38–126)
Anion gap: 10 (ref 5–15)
BUN: 19 mg/dL (ref 6–20)
CO2: 26 mmol/L (ref 22–32)
Calcium: 9.3 mg/dL (ref 8.9–10.3)
Chloride: 100 mmol/L — ABNORMAL LOW (ref 101–111)
Creatinine, Ser: 0.97 mg/dL (ref 0.44–1.00)
GFR calc Af Amer: >60 mL/min (ref >60)
GFR calc non Af Amer: 53 mL/min — ABNORMAL LOW (ref >60)
Glucose, Bld: 99 mg/dL (ref 65–99)
Potassium: 4.2 mmol/L (ref 3.5–5.1)
Sodium: 136 mmol/L (ref 135–145)
Total Bilirubin: 0.6 mg/dL (ref 0.3–1.2)
Total Protein: 7.1 g/dL (ref 6.5–8.1)

## 2017-11-24 LAB — URINALYSIS, ROUTINE W REFLEX MICROSCOPIC
Bacteria, UA: NONE SEEN
Bilirubin Urine: NEGATIVE
Glucose, UA: NEGATIVE mg/dL
Hgb urine dipstick: NEGATIVE
Ketones, ur: NEGATIVE mg/dL
Nitrite: NEGATIVE
Protein, ur: NEGATIVE mg/dL
Specific Gravity, Urine: 1.023 (ref 1.005–1.030)
pH: 5 (ref 5.0–8.0)

## 2017-11-24 LAB — LIPASE, BLOOD: Lipase: 39 U/L (ref 11–51)

## 2017-11-24 MED ORDER — ACETAMINOPHEN 325 MG PO TABS
650.0000 mg | ORAL_TABLET | Freq: Once | ORAL | Status: AC
  Administered 2017-11-24: 650 mg via ORAL
  Filled 2017-11-24: qty 2

## 2017-11-24 NOTE — Telephone Encounter (Signed)
I spoke with the pt , she needs to go to the eD  Woke with 10 pain in LLQ has been complaining for weeks, on and off, not sure if acute diverticulitis

## 2017-11-24 NOTE — Telephone Encounter (Signed)
Anything else you would like to do about this? Repeat urine?

## 2017-11-24 NOTE — Discharge Instructions (Addendum)
Your CT scan today showed a lung nodule.  You will need to follow-up with your primary care doctor for a repeat CT scan in 12 months.

## 2017-11-24 NOTE — Telephone Encounter (Signed)
Pt is calling as she is still hurting in her left side bottom of stomach PT advised she took a pain pill this am , and the pain went away for 3 hours And has now come back. Please call her

## 2017-11-24 NOTE — Telephone Encounter (Signed)
Pt called in again described a 10 plus pelvic pain which awakened her from her s;sleep this morning, I advised eD eval and also had a chance to speak with the Dr evaluating her after she had been in the ED with basic blood and urine testing complete, which was beneficial to all cconcerned

## 2017-11-24 NOTE — ED Provider Notes (Signed)
[facility] EMERGENCY DEPARTMENT Provider Note   CSN: [id number] Arrival date & time: 11/24/17  1417     History   Chief Complaint Chief Complaint  Patient presents with  . Flank Pain    HPI [name] is a 84 y.o. female.  HPI  84 year old female presents with left flank pain.  Has been on and off for several weeks and up to a month.  She states typically it is worse when she first wakes up and was very severe when she woke up this morning.  Her PCP treated her for a urinary tract infection during which time she had dysuria a couple weeks ago.  However despite that treatment she is still having this pain.  She states that she no longer has urinary symptoms.  There is no abdominal pain or midline back pain.  The pain does not radiate and she describes the pain is feeling like it is numb.  At this point it is not severe.  She took an oxycodone this morning but before that was unable to get up and walk due to the severity of pain.  She states his been feeling numb on and off for the last several days and this 1 spot.  There is no radiation down her legs or weakness/numbness in her lower extremities. Pain is worsened with walking.  PCP called because she wanted patient evaluated for diverticulitis given history of diverticulosis with low back pain and severe pain today.  Past Medical History:  Diagnosis Date  . Anemia   . Blood clot associated with vein wall inflammation    hx of in right leg  . CAD (coronary artery disease)   . Carpal tunnel syndrome on left    Left Hand pain with numbness, new onset   . Chronic back pain   . Chronic back pain    buldging disc  . Constipation   . Diarrhea   . Difficult intubation    small airway  . DJD (degenerative joint disease)    Severe  . Gastric ulcer    hx of   . GERD (gastroesophageal reflux disease)    takes Omeprazole daily  . Headache(784.0)    occasionally  . Hyperlipidemia    takes Lipitor nightly  . Hypertension    takes Amlodipine and Metoprolol daily  . Nocturia   . Obesity   . Pneumonia    hx of-in 2011  . Shortness of breath    with exertion  . Sleep apnea    sleep study done 56yrs ago  . Thyroid mass    right    Patient Active Problem List   Diagnosis Date Noted  . LLQ pain 11/21/2017  . Fall 07/20/2017  . Acute right-sided low back pain with right-sided sciatica 05/17/2017  . Phobia 05/17/2017  . Encounter for chronic pain management 04/29/2017  . Seafood allergy 01/31/2017  . Obesity (BMI 30.0-34.9) 10/12/2016  . CAD in native artery   . Right knee pain 04/17/2013  . Coronary vasospasm ([place]) 02/07/2013  . Depression 12/19/2012  . Hypothyroid 08/14/2012  . Prediabetes 10/10/2011  . CARPAL TUNNEL SYNDROME, BILATERAL 02/19/2010  . GERD 09/15/2009  . Coronary atherosclerosis 10/01/2008  . DYSPEPSIA 07/07/2008  . BACK PAIN WITH RADICULOPATHY 07/03/2008  . Hyperlipidemia 11/24/2007  . Essential hypertension 11/24/2007    Past Surgical History:  Procedure Laterality Date  . ABDOMINAL HYSTERECTOMY    . APPENDECTOMY  1987  . Arthoscopy left knee  1992  . Arthroscopy left shoulder  1999  . Back  Surgery  lumbar  1989 /1999   x 2  . BACK SURGERY    . CARDIAC CATHETERIZATION  05/2010  . CARDIAC CATHETERIZATION N/A 09/30/2015   Procedure: Left Heart Cath and Coronary Angiography;  Surgeon: Troy Sine, MD;  Location: [facility];  Service: Cardiovascular;  Laterality: N/A;  . Carpal tunnel release right     x 2  . CHOLECYSTECTOMY    . COLONOSCOPY    . ESOPHAGOGASTRODUODENOSCOPY    . LUMBAR FUSION  March 19, 2014  . NM MYOCAR PERF WALL MOTION  11/12/2008   no ischemia  . PARTIAL HYSTERECTOMY  1972  . ROTATOR CUFF REPAIR     right  . SPINE SURGERY    . THYROIDECTOMY  11/17/2011   Procedure: THYROIDECTOMY;  Surgeon: Ascencion Dike, MD;  Location: [facility];  Service: ENT;  Laterality: Right;  WITH FROZEN SECTION  . TONSILLECTOMY      OB History   None      Home  Medications    Prior to Admission medications   Medication Sig Start Date End Date Taking? Authorizing Provider  acetaminophen (TYLENOL) 500 MG tablet Take 500 mg by mouth every 4 (four) hours as needed for mild pain.   Yes [provider]  amLODipine (NORVASC) 10 MG tablet Take 1 tablet (10 mg total) daily by mouth. 07/19/17  Yes Fayrene Helper, MD  Ascorbic Acid (VITAMIN C) 500 MG tablet Take 500 mg by mouth daily.    Yes [provider]  aspirin EC 81 MG tablet Take 81 mg by mouth daily.   Yes [provider]  atorvastatin (LIPITOR) 20 MG tablet Take 1 tablet (20 mg total) by mouth daily. 01/31/17  Yes Fayrene Helper, MD  calcium carbonate (OS-CAL - DOSED IN MG OF ELEMENTAL CALCIUM) 1250 MG tablet Take 1 tablet by mouth daily.   Yes [provider]  Cyanocobalamin (B-12) 1000 MCG CAPS Take 1 capsule by mouth daily.   Yes [provider]  diclofenac sodium (VOLTAREN) 1 % GEL Apply 1 application topically 4 (four) times daily. Patient taking differently: Apply 2 g topically 2 (two) times daily as needed (for back pain).  02/28/12  Yes Fayrene Helper, MD  ferrous sulfate 325 (65 FE) MG tablet Take 325 mg by mouth daily with breakfast.    Yes [provider]  hydrochlorothiazide (MICROZIDE) 12.5 MG capsule take 1 capsule by mouth once daily 10/21/14  Yes Fayrene Helper, MD  HYDROcodone-acetaminophen (NORCO) 7.5-325 MG tablet Take 1 tablet by mouth daily as needed for moderate pain. 11/14/17  Yes Fayrene Helper, MD  isosorbide mononitrate (IMDUR) 30 MG 24 hr tablet Take 1 tablet (30 mg total) by mouth daily. 01/21/17  Yes Croitoru, Mihai, MD  levothyroxine (SYNTHROID, LEVOTHROID) 25 MCG tablet Take 1 tablet (25 mcg total) by mouth every morning. 09/26/17  Yes Fayrene Helper, MD  Multiple Vitamin (MULITIVITAMIN WITH MINERALS) TABS Take 1 tablet by mouth daily.   Yes [provider]  potassium chloride (K-DUR) 10  MEQ tablet Take 1 tablet (10 mEq total) 2 (two) times daily by mouth. 07/19/17  Yes Fayrene Helper, MD  EPIPEN 2-PAK 0.3 MG/0.3ML SOAJ injection inject as directed for SEVERE ALLERGIC REACTIONS 01/26/17   Fayrene Helper, MD  fluticasone (FLONASE) 50 MCG/ACT nasal spray Place 1 spray into both nostrils daily. Patient taking differently: Place 1 spray into both nostrils once as needed for allergies.  09/26/17   Fayrene Helper, MD  nitroGLYCERIN (NITROLINGUAL) 0.4 MG/SPRAY spray Place 1 spray every 5 (five) minutes x 3 doses as needed under the tongue for chest pain. 07/20/17   Croitoru, Mihai, MD  predniSONE (DELTASONE) 20 MG tablet One tablet twice daily for 5 days Patient not taking: Reported on 11/07/2017 09/26/17   Fayrene Helper, MD  sulfamethoxazole-trimethoprim (BACTRIM DS,SEPTRA DS) 800-160 MG tablet Take 1 tablet by mouth 2 (two) times daily. Patient not taking: Reported on 11/24/2017 11/07/17   Raylene Everts, MD    Family History Family History  Problem Relation Age of Onset  . Anuerysm Mother        died at 8  . Stroke Father        55 at death   . Anesthesia problems Neg Hx   . Hypotension Neg Hx   . Malignant hyperthermia Neg Hx   . Pseudochol deficiency Neg Hx   . Allergic rhinitis Neg Hx   . Angioedema Neg Hx   . Asthma Neg Hx   . Atopy Neg Hx   . Eczema Neg Hx   . Immunodeficiency Neg Hx   . Urticaria Neg Hx     Social History Social History   Tobacco Use  . Smoking status: Former Smoker    Types: Cigarettes    Last attempt to quit: 09/06/1984    Years since quitting: 33.2  . Smokeless tobacco: Never Used  Substance Use Topics  . Alcohol use: No    Comment: Casual  . Drug use: No     Allergies   Ace inhibitors; Bupropion hcl; Codeine; Penicillins; Shellfish allergy; Angiotensin receptor blockers; and Bee venom   Review of Systems Review of Systems  Constitutional: Negative for fever.  Gastrointestinal: Negative for abdominal pain and  vomiting.  Genitourinary: Positive for flank pain. Negative for dysuria and hematuria.  Musculoskeletal: Positive for back pain.  Neurological: Positive for numbness. Negative for weakness.  All other systems reviewed and are negative.    Physical Exam Updated Vital Signs BP (!) 152/87   Pulse 79   Temp 98 F (36.7 C) (Oral)   Resp 16   Ht 5\' 2"  (1.575 m)   Wt 73.5 kg (162 lb)   SpO2 100%   BMI 29.63 kg/m   Physical Exam  Constitutional: She is oriented to person, place, and time. She appears well-developed and well-nourished. No distress.  obese  HENT:  Head: Normocephalic and atraumatic.  Right Ear: External ear normal.  Left Ear: External ear normal.  Nose: Nose normal.  Eyes: Right eye exhibits no discharge. Left eye exhibits no discharge.  Cardiovascular: Normal rate, regular rhythm, normal heart sounds and intact distal pulses.  2+ DP pulses  Pulmonary/Chest: Effort normal and breath sounds normal.  Abdominal: Soft. She exhibits no distension. There is no tenderness. There is no CVA tenderness.  Musculoskeletal:       Left hip: She exhibits normal range of motion and no tenderness.       Back:  Neurological: She is alert and oriented to person, place, and time.  5/5 strength in BLE. Normal gross sensation  Skin: Skin is warm and dry. She is not diaphoretic.  Nursing note and vitals reviewed.    ED Treatments / Results  Labs (all labs ordered are listed, but only abnormal results are displayed) Labs Reviewed  COMPREHENSIVE METABOLIC PANEL - Abnormal; Notable for the following components:      Result Value   Chloride 100 (*)  GFR calc non Af Amer 53 (*)    All other components within normal limits  URINALYSIS, ROUTINE W REFLEX MICROSCOPIC - Abnormal; Notable for the following components:   Leukocytes, UA TRACE (*)    Squamous Epithelial / LPF 0-5 (*)    All other components within normal limits  LIPASE, BLOOD  CBC    EKG  EKG Interpretation None        Radiology Ct Abdomen Pelvis Wo Contrast  Result Date: 11/24/2017 CLINICAL DATA:  Left hip pain x1 week from a severe today. Patient fell last spring. History of hysterectomy, appendectomy, cholecystectomy and back surgery. EXAM: CT ABDOMEN AND PELVIS WITHOUT CONTRAST TECHNIQUE: Multidetector CT imaging of the abdomen and pelvis was performed following the standard protocol without IV contrast. COMPARISON:  06/13/2010 FINDINGS: Lower chest: Left basilar atelectasis and/or scarring with tiny 4 mm nodular density. Normal size heart without pericardial effusion. Coronary arteriosclerosis is noted along the RCA. Hepatobiliary: The unenhanced liver is unremarkable without space-occupying mass given limitations of a noncontrast study. No biliary dilatation. Status post cholecystectomy. Pancreas: Normal Spleen: Normal size without mass. Adrenals/Urinary Tract: Normal bilateral adrenal glands. No nephrolithiasis nor obstructive uropathy. No renal masses. Physiologic distention of the urinary bladder without focal mural thickening or calculus. Stomach/Bowel: Nondistended stomach. Probable duodenal diverticulum off the second portion. Normal bowel rotation without acute bowel inflammation or obstruction. Colonic diverticulosis is noted along the distal descending and sigmoid colon without acute inflammation. Status post appendectomy. Vascular/Lymphatic: Moderate aortoiliac atherosclerosis. No aneurysm or adenopathy. Reproductive: Status post hysterectomy. No adnexal masses. Other: No free air nor free fluid. Musculoskeletal: Joint space narrowing of both hips with subchondral degenerative cystic change of the posterior aspect of the left femoral head and acetabulum. No fracture or suspicious osseous lesions. Degenerative disc disease of the included lower thoracic spine from T8 through T12 with marked discogenic sclerosis at T11-12 with associated vacuum disc. Pedicle screw fixation from T12 L2 is noted with  interbody cages at L2-3 and L4-5. Spinal decompressive changes are noted L3 through L5. Grade 1 anterolisthesis of L5 on S1 with discogenic sclerosis of the endplates and marked disc flattening. No aggressive osseous abnormality. IMPRESSION: 1. Osteoarthritis of the left hip with subchondral cystic change of left femoral head and acetabulum. No acute fracture or suspicious osseous abnormalities. 2. Tiny 4 mm left lower lobe nodular density with adjacent atelectasis and/or scarring. No follow-up needed if patient is low-risk. Non-contrast chest CT can be considered in 12 months if patient is high-risk. This recommendation follows the consensus statement: Guidelines for Management of Incidental Pulmonary Nodules Detected on CT Images: From the Fleischner Society 2017; Radiology 2017; 284:228-243. 3. Minimal coronary arteriosclerosis the RCA. 4. Colonic diverticulosis without diverticulitis. 5. Thoracolumbar spondylosis with postsurgical changes above Electronically Signed   By: Ashley Royalty M.D.   On: 11/24/2017 19:24    Procedures Procedures (including critical care time)  Medications Ordered in ED Medications  acetaminophen (TYLENOL) tablet 650 mg (650 mg Oral Given 11/24/17 2004)     Initial Impression / Assessment and Plan / ED Course  I have reviewed the triage vital signs and the nursing notes.  Pertinent labs & imaging results that were available during my care of the patient were reviewed by me and considered in my medical decision making (see chart for details).     Patient's pain appears to be coming from arthritis in her left hip and acetabulum.  CT is otherwise reassuring besides these findings.  No fracture.  She is  able to ambulate.  She has been starting on some Aleve which I think is a good idea with the arthritis.  Otherwise her presentation does sound like arthritis with the worst pain in the morning when she first wakes up.  Given the degree of pain, she will be referred to  orthopedics for outpatient management.  She is asking for some Tylenol here and will be given some.  Otherwise we discussed her lung nodule seen on CT and she will need outpatient monitoring with repeat CT by her PCP given prior smoking history.  Discussed return precautions.  Final Clinical Impressions(s) / ED Diagnoses   Final diagnoses:  Arthritis of left hip  Lung nodule    ED Discharge Orders    None       Sherwood Gambler, MD 11/24/17 2022

## 2017-11-24 NOTE — ED Triage Notes (Signed)
Pt states having left sided intermittent flank pain x1 month.  Pt was just treated for a UTI with no relief.

## 2017-11-25 NOTE — Telephone Encounter (Signed)
Daughter requesting a call back with an update on her ER visit bc she will be coming into town soon and she would like to take her mother back with her and she needs to know what is going on with her

## 2017-11-26 NOTE — Telephone Encounter (Signed)
Having spoken to her daughter Jordyne, She is aware of the report from the ED including the fact that she has  a lung nodule which fu is recommended for in 1 year.I have decided to commit to dedicated lung scan which I intend to discuss at May visit and daughter is aware Also that the ED visit shows only arthritis in left hip and  Back as cause of pain She wants to be contacted with appt info re pain clinic and also for my appt Contact info cell [phone] can leave a message  Her work # which they will call her to the phone  And then she can speak on her cell is [phone]

## 2017-11-26 NOTE — Telephone Encounter (Signed)
I spoke direct;ly with [name] this morning, she says she is still hurting. I reviewed her ED visit with her and explained that the studies showed that her pain is from arthritis in hip and spine, and I recommended additional  Tylenol and 1 ibuprofen daily for her pain.  I made no mention of the concern re her chest during the call and will discuss this with her daughter Truitt Merle , who has called several times, and lives in [place] and wants her mother to move [place] with her , but pt says she is unable to do so now [employer] number is [phone]

## 2017-12-02 NOTE — Telephone Encounter (Signed)
I spoke with referral coordinator today and asked that she follow up o thi referral

## 2017-12-07 ENCOUNTER — Ambulatory Visit (INDEPENDENT_AMBULATORY_CARE_PROVIDER_SITE_OTHER): Payer: Medicare HMO | Admitting: Orthopedic Surgery

## 2017-12-07 ENCOUNTER — Ambulatory Visit (INDEPENDENT_AMBULATORY_CARE_PROVIDER_SITE_OTHER): Payer: Medicare HMO

## 2017-12-07 ENCOUNTER — Encounter (INDEPENDENT_AMBULATORY_CARE_PROVIDER_SITE_OTHER): Payer: Self-pay | Admitting: Orthopedic Surgery

## 2017-12-07 DIAGNOSIS — M25552 Pain in left hip: Secondary | ICD-10-CM

## 2017-12-09 NOTE — Progress Notes (Signed)
Office Visit Note   Patient: [name]           Date of Birth: 1933-12-26           MRN: [id number] Visit Date: 12/07/2017 Requested by: Fayrene Helper, MD [address], [place], [place] [address] PCP: Fayrene Helper, MD  Subjective: Chief Complaint  Patient presents with  . Right Hip - Pain, Numbness    HPI: [name] is a patient with right sided groin pain.  She has had multiple back surgeries done in [place].  She reports some left-sided groin pain but also right posterior buttock pain.  Takes hydrocodone about 1/day.  She did see her surgeon in December of last year.  She had epidural steroid injection in August.  Patient does stay active.  Patient lives alone.  Has not had physical therapy.              ROS: All systems reviewed are negative as they relate to the chief complaint within the history of present illness.  Patient denies  fevers or chills.   Assessment & Plan: Visit Diagnoses:  1. Pain in left hip     Plan: Impression is groin pain with normal radiographs.  This is likely radiating pain from the back.  Do not see an indication for any intervention in the hip at this time based on radiographs examination and history.  I will see her back as needed.  Follow-Up Instructions: Return if symptoms worsen or fail to improve.   Orders:  Orders Placed This Encounter  Procedures  . XR HIP UNILAT W OR W/O PELVIS 2-3 VIEWS LEFT   No orders of the defined types were placed in this encounter.     Procedures: No procedures performed   Clinical Data: No additional findings.  Objective: Vital Signs: There were no vitals taken for this visit.  Physical Exam:   Constitutional: Patient appears well-developed HEENT:  Head: Normocephalic Eyes:EOM are normal Neck: Normal range of motion Cardiovascular: Normal rate Pulmonary/chest: Effort normal Neurologic: Patient is alert Skin: Skin is warm Psychiatric: Patient has normal mood and  affect    Ortho Exam: Orthopedic exam demonstrates no nerve root tension signs.  Patient has palpable pedal pulses.  No groin pain with internal/external rotation of either side.  No other masses lymph adenopathy or skin changes noted in the hip region.  No muscle atrophy in the legs.  Reflexes symmetric.  Not much in the way of pain with forward and lateral bending and no trochanteric tenderness is noted.  Specialty Comments:  No specialty comments available.  Imaging: No results found.   PMFS History: Patient Active Problem List   Diagnosis Date Noted  . LLQ pain 11/21/2017  . Fall 07/20/2017  . Acute right-sided low back pain with right-sided sciatica 05/17/2017  . Phobia 05/17/2017  . Encounter for chronic pain management 04/29/2017  . Seafood allergy 01/31/2017  . Obesity (BMI 30.0-34.9) 10/12/2016  . CAD in native artery   . Right knee pain 04/17/2013  . Coronary vasospasm ([place]) 02/07/2013  . Depression 12/19/2012  . Hypothyroid 08/14/2012  . Prediabetes 10/10/2011  . CARPAL TUNNEL SYNDROME, BILATERAL 02/19/2010  . GERD 09/15/2009  . Coronary atherosclerosis 10/01/2008  . DYSPEPSIA 07/07/2008  . BACK PAIN WITH RADICULOPATHY 07/03/2008  . Hyperlipidemia 11/24/2007  . Essential hypertension 11/24/2007   Past Medical History:  Diagnosis Date  . Anemia   . Blood clot associated with vein wall inflammation    hx of  in right leg  . CAD (coronary artery disease)   . Carpal tunnel syndrome on left    Left Hand pain with numbness, new onset   . Chronic back pain   . Chronic back pain    buldging disc  . Constipation   . Diarrhea   . Difficult intubation    small airway  . DJD (degenerative joint disease)    Severe  . Gastric ulcer    hx of   . GERD (gastroesophageal reflux disease)    takes Omeprazole daily  . Headache(784.0)    occasionally  . Hyperlipidemia    takes Lipitor nightly  . Hypertension    takes Amlodipine and Metoprolol daily  . Nocturia   .  Obesity   . Pneumonia    hx of-in 2011  . Shortness of breath    with exertion  . Sleep apnea    sleep study done 20yrs ago  . Thyroid mass    right    Family History  Problem Relation Age of Onset  . Anuerysm Mother        died at 64  . Stroke Father        67 at death   . Anesthesia problems Neg Hx   . Hypotension Neg Hx   . Malignant hyperthermia Neg Hx   . Pseudochol deficiency Neg Hx   . Allergic rhinitis Neg Hx   . Angioedema Neg Hx   . Asthma Neg Hx   . Atopy Neg Hx   . Eczema Neg Hx   . Immunodeficiency Neg Hx   . Urticaria Neg Hx     Past Surgical History:  Procedure Laterality Date  . ABDOMINAL HYSTERECTOMY    . APPENDECTOMY  1987  . Arthoscopy left knee  1992  . Arthroscopy left shoulder  1999  . Back  Surgery  lumbar  1989 /1999   x 2  . BACK SURGERY    . CARDIAC CATHETERIZATION  05/2010  . CARDIAC CATHETERIZATION N/A 09/30/2015   Procedure: Left Heart Cath and Coronary Angiography;  Surgeon: Troy Sine, MD;  Location: [facility];  Service: Cardiovascular;  Laterality: N/A;  . Carpal tunnel release right     x 2  . CHOLECYSTECTOMY    . COLONOSCOPY    . ESOPHAGOGASTRODUODENOSCOPY    . LUMBAR FUSION  March 19, 2014  . NM MYOCAR PERF WALL MOTION  11/12/2008   no ischemia  . PARTIAL HYSTERECTOMY  1972  . ROTATOR CUFF REPAIR     right  . SPINE SURGERY    . THYROIDECTOMY  11/17/2011   Procedure: THYROIDECTOMY;  Surgeon: Ascencion Dike, MD;  Location: [facility];  Service: ENT;  Laterality: Right;  WITH FROZEN SECTION  . TONSILLECTOMY     Social History   Occupational History    Employer: RETIRED  Tobacco Use  . Smoking status: Former Smoker    Types: Cigarettes    Last attempt to quit: 09/06/1984    Years since quitting: 33.2  . Smokeless tobacco: Never Used  Substance and Sexual Activity  . Alcohol use: No    Comment: Casual  . Drug use: No  . Sexual activity: Never    Birth control/protection: Surgical

## 2017-12-12 ENCOUNTER — Encounter (HOSPITAL_COMMUNITY): Payer: Self-pay

## 2017-12-12 ENCOUNTER — Ambulatory Visit (HOSPITAL_COMMUNITY)
Admission: RE | Admit: 2017-12-12 | Discharge: 2017-12-12 | Disposition: A | Discharge status: Home / Self Care | Payer: Medicare HMO | Source: Ambulatory Visit | Attending: Family Medicine | Admitting: Family Medicine

## 2017-12-12 DIAGNOSIS — Z1231 Encounter for screening mammogram for malignant neoplasm of breast: Secondary | ICD-10-CM

## 2017-12-19 ENCOUNTER — Ambulatory Visit (INDEPENDENT_AMBULATORY_CARE_PROVIDER_SITE_OTHER): Payer: Medicare HMO

## 2017-12-19 VITALS — BP 118/78 | HR 88 | Temp 98.2°F | Resp 16 | Ht 62.0 in | Wt 167.0 lb

## 2017-12-19 DIAGNOSIS — Z0000 Encounter for general adult medical examination without abnormal findings: Secondary | ICD-10-CM

## 2017-12-19 NOTE — Patient Instructions (Signed)
[name] , Thank you for taking time to come for your Medicare Wellness Visit. I appreciate your ongoing commitment to your health goals. Please review the following plan we discussed and let me know if I can assist you in the future.   Screening recommendations/referrals: Colonoscopy: Discuss with Dr. Moshe Cipro Mammogram: Due 12/2018 Bone Density: Up to date Recommended yearly ophthalmology/optometry visit for glaucoma screening and checkup Recommended yearly dental visit for hygiene and checkup  Vaccinations: Influenza vaccine: Due fall 2019 Pneumococcal vaccine: Due 2021 Tdap vaccine: Due 2024 Shingles vaccine: Due. Please call insurance for coverage    Advanced directives: Discussed at visit  Conditions/risks identified: None  Next appointment: 01/31/18   Preventive Care 65 Years and Older, Female Preventive care refers to lifestyle choices and visits with your health care provider that can promote health and wellness. What does preventive care include?  A yearly physical exam. This is also called an annual well check.  Dental exams once or twice a year.  Routine eye exams. Ask your health care provider how often you should have your eyes checked.  Personal lifestyle choices, including:  Daily care of your teeth and gums.  Regular physical activity.  Eating a healthy diet.  Avoiding tobacco and drug use.  Limiting alcohol use.  Practicing safe sex.  Taking low-dose aspirin every day.  Taking vitamin and mineral supplements as recommended by your health care provider. What happens during an annual well check? The services and screenings done by your health care provider during your annual well check will depend on your age, overall health, lifestyle risk factors, and family history of disease. Counseling  Your health care provider may ask you questions about your:  Alcohol use.  Tobacco use.  Drug use.  Emotional well-being.  Home and relationship  well-being.  Sexual activity.  Eating habits.  History of falls.  Memory and ability to understand (cognition).  Work and work Statistician.  Reproductive health. Screening  You may have the following tests or measurements:  Height, weight, and BMI.  Blood pressure.  Lipid and cholesterol levels. These may be checked every 5 years, or more frequently if you are over 44 years old.  Skin check.  Lung cancer screening. You may have this screening every year starting at age 61 if you have a 30-pack-year history of smoking and currently smoke or have quit within the past 15 years.  Fecal occult blood test (FOBT) of the stool. You may have this test every year starting at age 52.  Flexible sigmoidoscopy or colonoscopy. You may have a sigmoidoscopy every 5 years or a colonoscopy every 10 years starting at age 63.  Hepatitis C blood test.  Hepatitis B blood test.  Sexually transmitted disease (STD) testing.  Diabetes screening. This is done by checking your blood sugar (glucose) after you have not eaten for a while (fasting). You may have this done every 1-3 years.  Bone density scan. This is done to screen for osteoporosis. You may have this done starting at age 27.  Mammogram. This may be done every 1-2 years. Talk to your health care provider about how often you should have regular mammograms. Talk with your health care provider about your test results, treatment options, and if necessary, the need for more tests. Vaccines  Your health care provider may recommend certain vaccines, such as:  Influenza vaccine. This is recommended every year.  Tetanus, diphtheria, and acellular pertussis (Tdap, Td) vaccine. You may need a Td booster every 10 years.  Zoster vaccine. You may need this after age 56.  Pneumococcal 13-valent conjugate (PCV13) vaccine. One dose is recommended after age 76.  Pneumococcal polysaccharide (PPSV23) vaccine. One dose is recommended after age  43. Talk to your health care provider about which screenings and vaccines you need and how often you need them. This information is not intended to replace advice given to you by your health care provider. Make sure you discuss any questions you have with your health care provider. Document Released: 09/19/2015 Document Revised: 05/12/2016 Document Reviewed: 06/24/2015 Elsevier Interactive Patient Education  2017 [place] Prevention in the Home Falls can cause injuries. They can happen to people of all ages. There are many things you can do to make your home safe and to help prevent falls. What can I do on the outside of my home?  Regularly fix the edges of walkways and driveways and fix any cracks.  Remove anything that might make you trip as you walk through a door, such as a raised step or threshold.  Trim any bushes or trees on the path to your home.  Use bright outdoor lighting.  Clear any walking paths of anything that might make someone trip, such as rocks or tools.  Regularly check to see if handrails are loose or broken. Make sure that both sides of any steps have handrails.  Any raised decks and porches should have guardrails on the edges.  Have any leaves, snow, or ice cleared regularly.  Use sand or salt on walking paths during winter.  Clean up any spills in your garage right away. This includes oil or grease spills. What can I do in the bathroom?  Use night lights.  Install grab bars by the toilet and in the tub and shower. Do not use towel bars as grab bars.  Use non-skid mats or decals in the tub or shower.  If you need to sit down in the shower, use a plastic, non-slip stool.  Keep the floor dry. Clean up any water that spills on the floor as soon as it happens.  Remove soap buildup in the tub or shower regularly.  Attach bath mats securely with double-sided non-slip rug tape.  Do not have throw rugs and other things on the floor that can make  you trip. What can I do in the bedroom?  Use night lights.  Make sure that you have a light by your bed that is easy to reach.  Do not use any sheets or blankets that are too big for your bed. They should not hang down onto the floor.  Have a firm chair that has side arms. You can use this for support while you get dressed.  Do not have throw rugs and other things on the floor that can make you trip. What can I do in the kitchen?  Clean up any spills right away.  Avoid walking on wet floors.  Keep items that you use a lot in easy-to-reach places.  If you need to reach something above you, use a strong step stool that has a grab bar.  Keep electrical cords out of the way.  Do not use floor polish or wax that makes floors slippery. If you must use wax, use non-skid floor wax.  Do not have throw rugs and other things on the floor that can make you trip. What can I do with my stairs?  Do not leave any items on the stairs.  Make sure that there are  handrails on both sides of the stairs and use them. Fix handrails that are broken or loose. Make sure that handrails are as long as the stairways.  Check any carpeting to make sure that it is firmly attached to the stairs. Fix any carpet that is loose or worn.  Avoid having throw rugs at the top or bottom of the stairs. If you do have throw rugs, attach them to the floor with carpet tape.  Make sure that you have a light switch at the top of the stairs and the bottom of the stairs. If you do not have them, ask someone to add them for you. What else can I do to help prevent falls?  Wear shoes that:  Do not have high heels.  Have rubber bottoms.  Are comfortable and fit you well.  Are closed at the toe. Do not wear sandals.  If you use a stepladder:  Make sure that it is fully opened. Do not climb a closed stepladder.  Make sure that both sides of the stepladder are locked into place.  Ask someone to hold it for you, if  possible.  Clearly mark and make sure that you can see:  Any grab bars or handrails.  First and last steps.  Where the edge of each step is.  Use tools that help you move around (mobility aids) if they are needed. These include:  Canes.  Walkers.  Scooters.  Crutches.  Turn on the lights when you go into a dark area. Replace any light bulbs as soon as they burn out.  Set up your furniture so you have a clear path. Avoid moving your furniture around.  If any of your floors are uneven, fix them.  If there are any pets around you, be aware of where they are.  Review your medicines with your doctor. Some medicines can make you feel dizzy. This can increase your chance of falling. Ask your doctor what other things that you can do to help prevent falls. This information is not intended to replace advice given to you by your health care provider. Make sure you discuss any questions you have with your health care provider. Document Released: 06/19/2009 Document Revised: 01/29/2016 Document Reviewed: 09/27/2014 Elsevier Interactive Patient Education  2017 [place] for Adults  A healthy lifestyle and preventive care can promote health and wellness. Preventive health guidelines for adults include the following key practices.  . A routine yearly physical is a good way to check with your health care provider about your health and preventive screening. It is a chance to share any concerns and updates on your health and to receive a thorough exam.  . Visit your dentist for a routine exam and preventive care every 6 months. Brush your teeth twice a day and floss once a day. Good oral hygiene prevents tooth decay and gum disease.  . The frequency of eye exams is based on your age, health, family medical history, use  of contact lenses, and other factors. Follow your health care provider's ecommendations for frequency of eye exams.  . Eat a healthy diet. Foods like  vegetables, fruits, whole grains, low-fat dairy products, and lean protein foods contain the nutrients you need without too many calories. Decrease your intake of foods high in solid fats, added sugars, and salt. Eat the right amount of calories for you. Get information about a proper diet from your health care provider, if necessary.  . Regular physical exercise is one of  the most important things you can do for your health. Most adults should get at least 150 minutes of moderate-intensity exercise (any activity that increases your heart rate and causes you to sweat) each week. In addition, most adults need muscle-strengthening exercises on 2 or more days a week.  Silver Sneakers may be a benefit available to you. To determine eligibility, you may visit the website: www.silversneakers.com or contact program at 236-101-4895 Mon-Fri between 8AM-8PM.   . Maintain a healthy weight. The body mass index (BMI) is a screening tool to identify possible weight problems. It provides an estimate of body fat based on height and weight. Your health care provider can find your BMI and can help you achieve or maintain a healthy weight.   For adults 20 years and older: ? A BMI below 18.5 is considered underweight. ? A BMI of 18.5 to 24.9 is normal. ? A BMI of 25 to 29.9 is considered overweight. ? A BMI of 30 and above is considered obese.   . Maintain normal blood lipids and cholesterol levels by exercising and minimizing your intake of saturated fat. Eat a balanced diet with plenty of fruit and vegetables. Blood tests for lipids and cholesterol should begin at age 18 and be repeated every 5 years. If your lipid or cholesterol levels are high, you are over 50, or you are at high risk for heart disease, you may need your cholesterol levels checked more frequently. Ongoing high lipid and cholesterol levels should be treated with medicines if diet and exercise are not working.  . If you smoke, find out from your  health care provider how to quit. If you do not use tobacco, please do not start.  . If you choose to drink alcohol, please do not consume more than 2 drinks per day. One drink is considered to be 12 ounces (355 mL) of beer, 5 ounces (148 mL) of wine, or 1.5 ounces (44 mL) of liquor.  . If you are 22-82 years old, ask your health care provider if you should take aspirin to prevent strokes.  . Use sunscreen. Apply sunscreen liberally and repeatedly throughout the day. You should seek shade when your shadow is shorter than you. Protect yourself by wearing long sleeves, pants, a wide-brimmed hat, and sunglasses year round, whenever you are outdoors.  . Once a month, do a whole body skin exam, using a mirror to look at the skin on your back. Tell your health care provider of new moles, moles that have irregular borders, moles that are larger than a pencil eraser, or moles that have changed in shape or color.

## 2017-12-19 NOTE — Progress Notes (Signed)
Subjective:   [name] is a 84 y.o. female who presents for Medicare Annual (Subsequent) preventive examination.  Review of Systems:   Cardiac Risk Factors include: advanced age (>24men, >73 women);obesity (BMI >30kg/m2);sedentary lifestyle;hypertension     Objective:     Vitals: BP 118/78 (BP Location: Left Arm, Patient Position: Sitting, Cuff Size: Normal)   Pulse 88   Temp 98.2 F (36.8 C) (Temporal)   Resp 16   Ht 5\' 2"  (1.575 m)   Wt 167 lb (75.8 kg)   SpO2 98%   BMI 30.54 kg/m   Body mass index is 30.54 kg/m.  Advanced Directives 12/19/2017 09/09/2017 09/09/2017 10/16/2016 04/16/2016 09/30/2015 06/11/2015  Does Patient Have a Medical Advance Directive? No No No No No No No  Would patient like information on creating a medical advance directive? Yes (MAU/Ambulatory/Procedural Areas - Information given) - - - No - patient declined information No - patient declined information No - patient declined information  Pre-existing out of facility DNR order (yellow form or pink MOST form) - - - - - - -    Tobacco Social History   Tobacco Use  Smoking Status Former Smoker  . Types: Cigarettes  . Last attempt to quit: 09/06/1984  . Years since quitting: 33.3  Smokeless Tobacco Never Used     Counseling given: Yes   Clinical Intake:  Pre-visit preparation completed: Yes  Pain : 0-10 Pain Score: 6      Nutritional Status: BMI > 30  Obese Diabetes: No  How often do you need to have someone help you when you read instructions, pamphlets, or other written materials from your doctor or pharmacy?: 1 - Never  Interpreter Needed?: No     Past Medical History:  Diagnosis Date  . Anemia   . Blood clot associated with vein wall inflammation    hx of in right leg  . CAD (coronary artery disease)   . Carpal tunnel syndrome on left    Left Hand pain with numbness, new onset   . Chronic back pain   . Chronic back pain    buldging disc  . Constipation   . Diarrhea     . Difficult intubation    small airway  . DJD (degenerative joint disease)    Severe  . Gastric ulcer    hx of   . GERD (gastroesophageal reflux disease)    takes Omeprazole daily  . Headache(784.0)    occasionally  . Hyperlipidemia    takes Lipitor nightly  . Hypertension    takes Amlodipine and Metoprolol daily  . Nocturia   . Obesity   . Pneumonia    hx of-in 2011  . Shortness of breath    with exertion  . Sleep apnea    sleep study done 86yrs ago  . Thyroid mass    right   Past Surgical History:  Procedure Laterality Date  . ABDOMINAL HYSTERECTOMY    . APPENDECTOMY  1987  . Arthoscopy left knee  1992  . Arthroscopy left shoulder  1999  . Back  Surgery  lumbar  1989 /1999   x 2  . BACK SURGERY    . CARDIAC CATHETERIZATION  05/2010  . CARDIAC CATHETERIZATION N/A 09/30/2015   Procedure: Left Heart Cath and Coronary Angiography;  Surgeon: Troy Sine, MD;  Location: [facility];  Service: Cardiovascular;  Laterality: N/A;  . Carpal tunnel release right     x 2  . CHOLECYSTECTOMY    .  COLONOSCOPY    . ESOPHAGOGASTRODUODENOSCOPY    . LUMBAR FUSION  March 19, 2014  . NM MYOCAR PERF WALL MOTION  11/12/2008   no ischemia  . PARTIAL HYSTERECTOMY  1972  . ROTATOR CUFF REPAIR     right  . SPINE SURGERY    . THYROIDECTOMY  11/17/2011   Procedure: THYROIDECTOMY;  Surgeon: Ascencion Dike, MD;  Location: [facility];  Service: ENT;  Laterality: Right;  WITH FROZEN SECTION  . TONSILLECTOMY     Family History  Problem Relation Age of Onset  . Anuerysm Mother        died at 49  . Stroke Father        21 at death   . Anesthesia problems Neg Hx   . Hypotension Neg Hx   . Malignant hyperthermia Neg Hx   . Pseudochol deficiency Neg Hx   . Allergic rhinitis Neg Hx   . Angioedema Neg Hx   . Asthma Neg Hx   . Atopy Neg Hx   . Eczema Neg Hx   . Immunodeficiency Neg Hx   . Urticaria Neg Hx    Social History   Socioeconomic History  . Marital status: Widowed    Spouse  name: Not on file  . Number of children: 4  . Years of education: Not on file  . Highest education level: Not on file  Occupational History    Employer: RETIRED  Social Needs  . Financial resource strain: Not hard at all  . Food insecurity:    Worry: Never true    Inability: Never true  . Transportation needs:    Medical: No    Non-medical: No  Tobacco Use  . Smoking status: Former Smoker    Types: Cigarettes    Last attempt to quit: 09/06/1984    Years since quitting: 33.3  . Smokeless tobacco: Never Used  Substance and Sexual Activity  . Alcohol use: No    Comment: Casual  . Drug use: No  . Sexual activity: Never    Birth control/protection: Surgical  Lifestyle  . Physical activity:    Days per week: 0 days    Minutes per session: 0 min  . Stress: Not at all  Relationships  . Social connections:    Talks on phone: More than three times a week    Gets together: Twice a week    Attends religious service: More than 4 times per year    Active member of club or organization: No    Attends meetings of clubs or organizations: Never    Relationship status: Divorced  Other Topics Concern  . Not on file  Social History Narrative  . Not on file    Outpatient Encounter Medications as of 12/19/2017  Medication Sig  . acetaminophen (TYLENOL) 500 MG tablet Take 500 mg by mouth every 4 (four) hours as needed for mild pain.  Marland Kitchen amLODipine (NORVASC) 10 MG tablet Take 1 tablet (10 mg total) daily by mouth.  . Ascorbic Acid (VITAMIN C) 500 MG tablet Take 500 mg by mouth daily.   Marland Kitchen aspirin EC 81 MG tablet Take 81 mg by mouth daily.  Marland Kitchen atorvastatin (LIPITOR) 20 MG tablet Take 1 tablet (20 mg total) by mouth daily.  . calcium carbonate (OS-CAL - DOSED IN MG OF ELEMENTAL CALCIUM) 1250 MG tablet Take 1 tablet by mouth daily.  . Cyanocobalamin (B-12) 1000 MCG CAPS Take 1 capsule by mouth daily.  . diclofenac sodium (VOLTAREN) 1 % GEL  Apply 1 application topically 4 (four) times daily.  (Patient taking differently: Apply 2 g topically 2 (two) times daily as needed (for back pain). )  . EPIPEN 2-PAK 0.3 MG/0.3ML SOAJ injection inject as directed for SEVERE ALLERGIC REACTIONS  . ferrous sulfate 325 (65 FE) MG tablet Take 325 mg by mouth daily with breakfast.   . fluticasone (FLONASE) 50 MCG/ACT nasal spray Place 1 spray into both nostrils daily. (Patient taking differently: Place 1 spray into both nostrils once as needed for allergies. )  . hydrochlorothiazide (MICROZIDE) 12.5 MG capsule take 1 capsule by mouth once daily  . HYDROcodone-acetaminophen (NORCO) 7.5-325 MG tablet Take 1 tablet by mouth daily as needed for moderate pain.  . isosorbide mononitrate (IMDUR) 30 MG 24 hr tablet Take 1 tablet (30 mg total) by mouth daily.  Marland Kitchen levothyroxine (SYNTHROID, LEVOTHROID) 25 MCG tablet Take 1 tablet (25 mcg total) by mouth every morning.  . Multiple Vitamin (MULITIVITAMIN WITH MINERALS) TABS Take 1 tablet by mouth daily.  . nitroGLYCERIN (NITROLINGUAL) 0.4 MG/SPRAY spray Place 1 spray every 5 (five) minutes x 3 doses as needed under the tongue for chest pain.  . potassium chloride (K-DUR) 10 MEQ tablet Take 1 tablet (10 mEq total) 2 (two) times daily by mouth.  . [DISCONTINUED] predniSONE (DELTASONE) 20 MG tablet One tablet twice daily for 5 days (Patient not taking: Reported on 12/19/2017)  . [DISCONTINUED] sulfamethoxazole-trimethoprim (BACTRIM DS,SEPTRA DS) 800-160 MG tablet Take 1 tablet by mouth 2 (two) times daily. (Patient not taking: Reported on 12/19/2017)   No facility-administered encounter medications on file as of 12/19/2017.     Activities of Daily Living In your present state of health, do you have any difficulty performing the following activities: 12/19/2017  Hearing? N  Vision? N  Difficulty concentrating or making decisions? N  Walking or climbing stairs? Y  Dressing or bathing? N  Doing errands, shopping? N  Preparing Food and eating ? N  Using the Toilet? N  In  the past six months, have you accidently leaked urine? Y  Do you have problems with loss of bowel control? Y  Managing your Medications? N  Managing your Finances? N  Housekeeping or managing your Housekeeping? N  Some recent data might be hidden    Patient Care Team: Fayrene Helper, MD as PCP - General Leta Baptist, MD as Attending Physician (Otolaryngology) Gala Romney Cristopher Estimable, MD as Attending Physician (Gastroenterology) Latanya Maudlin, MD as Consulting Physician (Orthopedic Surgery) Sanda Klein, MD as Consulting Physician (Cardiology)    Assessment:   This is a routine wellness examination for [name].  Exercise Activities and Dietary recommendations Current Exercise Habits: The patient does not participate in regular exercise at present  Goals    None      Fall Risk Fall Risk  12/19/2017 04/27/2017 04/09/2016 12/04/2015 03/17/2015  Falls in the past year? Yes No Yes Yes No  Number falls in past yr: 1 - 1 2 or more -  Injury with Fall? Yes - - No -  Risk for fall due to : - - - - -   Is the patient's home free of loose throw rugs in walkways, pet beds, electrical cords, etc?   yes      Grab bars in the bathroom? yes      Handrails on the stairs?   yes      Adequate lighting?   yes  Depression Screen PHQ 2/9 Scores 12/19/2017 04/27/2017 09/28/2016 03/17/2015  PHQ - 2 Score 0  0 0 1  PHQ- 9 Score - - - 3     Cognitive Function MMSE - Mini Mental State Exam 09/26/2017 01/27/2017  Orientation to time 5 5  Orientation to Place 5 5  Registration 3 3  Attention/ Calculation 5 5  Recall 0 3  Language- name 2 objects 2 -  Language- repeat 1 -  Language- follow 3 step command 3 -  Language- read & follow direction 1 -  Write a sentence 0 -  Copy design 1 -  Total score 26 -        Immunization History  Administered Date(s) Administered  . H1N1 10/01/2008  . Influenza Split 05/18/2012  . Influenza Whole 05/26/2007, 06/12/2009, 05/26/2011  . Influenza,inj,Quad PF,6+ Mos  06/22/2013, 06/25/2014, 08/06/2015, 09/28/2016, 04/27/2017  . Pneumococcal Conjugate-13 03/17/2015  . Pneumococcal Polysaccharide-23 02/11/2004  . Td 02/11/2004  . Tdap 07/14/2013  . Zoster 12/26/2006    Qualifies for Shingles Vaccine? Yes  Screening Tests Health Maintenance  Topic Date Due  . INFLUENZA VACCINE  04/06/2018  . TETANUS/TDAP  07/15/2023  . DEXA SCAN  Completed  . PNA vac Low Risk Adult  Completed    Cancer Screenings: Lung: Low Dose CT Chest recommended if Age 22-80 years, 30 pack-year currently smoking OR have quit w/in 15years. Patient does not qualify. Breast:  Up to date on Mammogram? Yes   Up to date of Bone Density/Dexa? Yes Colorectal: Discuss with Dr. Moshe Cipro      Plan:      I have personally reviewed and noted the following in the patient's chart:   . Medical and social history . Use of alcohol, tobacco or illicit drugs  . Current medications and supplements . Functional ability and status . Nutritional status . Physical activity . Advanced directives . List of other physicians . Hospitalizations, surgeries, and ER visits in previous 12 months . Vitals . Screenings to include cognitive, depression, and falls . Referrals and appointments  In addition, I have reviewed and discussed with patient certain preventive protocols, quality metrics, and best practice recommendations. A written personalized care plan for preventive services as well as general preventive health recommendations were provided to patient.     Merceda Elks, LPN  [phone]

## 2018-01-13 DIAGNOSIS — M47816 Spondylosis without myelopathy or radiculopathy, lumbar region: Secondary | ICD-10-CM | POA: Diagnosis not present

## 2018-01-13 DIAGNOSIS — M25569 Pain in unspecified knee: Secondary | ICD-10-CM | POA: Diagnosis not present

## 2018-01-13 DIAGNOSIS — G894 Chronic pain syndrome: Secondary | ICD-10-CM | POA: Diagnosis not present

## 2018-01-13 DIAGNOSIS — Z79891 Long term (current) use of opiate analgesic: Secondary | ICD-10-CM | POA: Diagnosis not present

## 2018-01-13 DIAGNOSIS — Z79899 Other long term (current) drug therapy: Secondary | ICD-10-CM | POA: Diagnosis not present

## 2018-01-18 ENCOUNTER — Telehealth: Payer: Self-pay | Admitting: Family Medicine

## 2018-01-18 NOTE — Telephone Encounter (Signed)
Patient lvm to call her back [phone]   I called her back, no answer, no voicemail.

## 2018-01-19 NOTE — Telephone Encounter (Signed)
Patient called back and I heard a different number on the voicemail  [phone]. Said to please call her back on message

## 2018-01-31 ENCOUNTER — Encounter: Payer: Self-pay | Admitting: Family Medicine

## 2018-01-31 ENCOUNTER — Ambulatory Visit (INDEPENDENT_AMBULATORY_CARE_PROVIDER_SITE_OTHER): Payer: Medicare HMO | Admitting: Family Medicine

## 2018-01-31 VITALS — BP 178/100 | HR 73 | Resp 16 | Ht 62.0 in | Wt 165.0 lb

## 2018-01-31 DIAGNOSIS — E669 Obesity, unspecified: Secondary | ICD-10-CM | POA: Diagnosis not present

## 2018-01-31 DIAGNOSIS — E890 Postprocedural hypothyroidism: Secondary | ICD-10-CM | POA: Diagnosis not present

## 2018-01-31 DIAGNOSIS — I10 Essential (primary) hypertension: Secondary | ICD-10-CM | POA: Diagnosis not present

## 2018-01-31 DIAGNOSIS — E7800 Pure hypercholesterolemia, unspecified: Secondary | ICD-10-CM

## 2018-01-31 MED ORDER — POTASSIUM CHLORIDE ER 10 MEQ PO TBCR: 10.0000 meq | EXTENDED_RELEASE_TABLET | Freq: Every day | ORAL | 5 refills | Status: DC

## 2018-01-31 MED ORDER — HYDROCHLOROTHIAZIDE 12.5 MG PO CAPS
12.5000 mg | ORAL_CAPSULE | Freq: Every day | ORAL | 5 refills | Status: DC
Start: 2018-01-31 — End: 2018-07-08

## 2018-01-31 MED ORDER — AMLODIPINE BESYLATE 10 MG PO TABS: 10.0000 mg | ORAL_TABLET | Freq: Every day | ORAL | 3 refills | Status: DC

## 2018-01-31 NOTE — Patient Instructions (Addendum)
F/U in 3 weeks , call if you need me sooner  Blood pressure is high today  You need Amlodipine one daily Microzide one daily Potassium one daily  labs needed fasting 5 days before visit, please drink water the day of the lab, lipid, cmp and eGFR, tSH and vit D  Thankful pain management is better

## 2018-01-31 NOTE — Progress Notes (Signed)
Fill Date ID Written Drug Qty Days Prescriber Rx # Pharmacy Refill Daily Dose * Pymt Type PMP  01/13/2018  1  01/13/2018  Hydrocodone-Acetamin 7.5-325  30 30 Pa Cam  [id number]  Wal ([id number])  0 7.50 MME Comm Ins  [place]  12/15/2017  1  11/14/2017  Hydrocodone-Acetamin 7.5-325  30 30 Ma Sim  [id number]  Wal ([id number])  0 7.50 MME Comm Ins  [place]  11/14/2017  1  11/14/2017  Hydrocodone-Acetamin 7.5-325  30 30 Ma Sim  [id number]  Wal ([id number])  0 7.50 MME Comm Ins  [place]  10/27/2017  1  10/10/2017  Tramadol Hcl 50 Mg Tablet  30 7 Ri Mca  [id number]  Wal (0019)  0 21.43 MME Comm Ins  [place]  08/29/2017  1  07/19/2017  Hydrocodone-Acetamin 7.5-325  30 30 Ma Sim  [id number]  Wal ([id number])  0 7.50 MME Medicare  [place]  07/19/2017  1  07/19/2017  Hydrocodone-Acetamin 7.5-325  30 30 Ma Sim  [id number]  Wal ([id number])  0 7.50 MME Medicare  [place]  06/08/2017  1  05/02/2017  Hydrocodone-Acetamin 7.5-325  30 30 Ma Sim  [id number]  Wal ([id number])  0 7.50 MME Medicare  [place]  05/02/2017  1  05/02/2017  Hydrocodone-Acetamin 7.5-325  30 30 Ma Sim  [id number]  Wal ([id number])  0 7.50 MME Comm Ins  [place]  04/09/2017  1  01/27/2017  Hydrocodone-Acetamin 7.5-325  30 30 Ma Sim  [id number]  Wal ([id number])  0 7.50 MME Comm Ins  [place]  03/01/2017  1  01/27/2017  Hydrocodone-Acetamin 7.5-325  30 30 Ma Sim

## 2018-02-10 DIAGNOSIS — Z79891 Long term (current) use of opiate analgesic: Secondary | ICD-10-CM | POA: Diagnosis not present

## 2018-02-10 DIAGNOSIS — G894 Chronic pain syndrome: Secondary | ICD-10-CM | POA: Diagnosis not present

## 2018-02-10 DIAGNOSIS — M25569 Pain in unspecified knee: Secondary | ICD-10-CM | POA: Diagnosis not present

## 2018-02-10 DIAGNOSIS — M47816 Spondylosis without myelopathy or radiculopathy, lumbar region: Secondary | ICD-10-CM | POA: Diagnosis not present

## 2018-02-10 DIAGNOSIS — Z79899 Other long term (current) drug therapy: Secondary | ICD-10-CM | POA: Diagnosis not present

## 2018-02-20 ENCOUNTER — Encounter: Payer: Self-pay | Admitting: Family Medicine

## 2018-02-20 ENCOUNTER — Ambulatory Visit (INDEPENDENT_AMBULATORY_CARE_PROVIDER_SITE_OTHER): Payer: Medicare HMO | Admitting: Family Medicine

## 2018-02-20 VITALS — BP 118/82 | HR 100 | Resp 16 | Ht 62.0 in | Wt 163.1 lb

## 2018-02-20 DIAGNOSIS — M1991 Primary osteoarthritis, unspecified site: Secondary | ICD-10-CM

## 2018-02-20 DIAGNOSIS — E7800 Pure hypercholesterolemia, unspecified: Secondary | ICD-10-CM

## 2018-02-20 DIAGNOSIS — R7303 Prediabetes: Secondary | ICD-10-CM

## 2018-02-20 DIAGNOSIS — E039 Hypothyroidism, unspecified: Secondary | ICD-10-CM | POA: Diagnosis not present

## 2018-02-20 DIAGNOSIS — I10 Essential (primary) hypertension: Secondary | ICD-10-CM | POA: Diagnosis not present

## 2018-02-20 DIAGNOSIS — E559 Vitamin D deficiency, unspecified: Secondary | ICD-10-CM

## 2018-02-20 MED ORDER — DICLOFENAC SODIUM 75 MG PO TBEC: DELAYED_RELEASE_TABLET | ORAL | 0 refills | Status: DC

## 2018-02-20 NOTE — Assessment & Plan Note (Signed)
Updated lab needed at/ before next visit.Past due

## 2018-02-20 NOTE — Patient Instructions (Addendum)
Physical exam August 30 or after, call if yopu need me sooner  Blood pressure great  Today  Please stay on the 2 blood presure meds you are taking and thr potasssium  New for uncontrolled knee pain ONLY if you need it is the voltaren no more than two tabs in any week, sometinmes none, rely on topical rums, and tur regular pain med and tylenol  Please go today for labs today, please wait for order in the front  HBa1C, lipid, cmp and EGFR, TSH and vit D  Bwe careful not to fall  Thank you  for choosing [facility]. We consider it a privelige to serve you.  Delivering excellent health care in a caring and  compassionate way is our goal.  Partnering with you,  so that together we can achieve this goal is our strategy.

## 2018-02-20 NOTE — Assessment & Plan Note (Signed)
Unchanged Patient re-educated about  the importance of commitment to a  minimum of 150 minutes of exercise per week.  The importance of healthy food choices with portion control discussed.  Weight /BMI 01/31/2018 12/19/2017 11/24/2017  WEIGHT 165 lb 167 lb 162 lb  HEIGHT 5\' 2"  5\' 2"  5\' 2"   BMI 30.18 kg/m2 30.54 kg/m2 29.63 kg/m2

## 2018-02-20 NOTE — Assessment & Plan Note (Signed)
Improved management through pain clinic, continue same

## 2018-02-20 NOTE — Assessment & Plan Note (Signed)
Hyperlipidemia:Low fat diet discussed and encouraged.   Lipid Panel  Lab Results  Component Value Date   CHOL 202 (H) 05/03/2017   HDL 52 05/03/2017   LDLCALC 116 (H) 05/03/2017   TRIG 170 (H) 05/03/2017   CHOLHDL 3.9 05/03/2017   Updated lab needed at/  next visit.Past due

## 2018-02-20 NOTE — Progress Notes (Signed)
Arna Luis     MRN: [id number]      DOB: 1934/02/06   HPI Ms. Alessio is here for follow up and re-evaluation of chronic medical conditions, medication management and review of any available recent lab and radiology data.  Preventive health is updated, specifically  Cancer screening and Immunization.   Now having her pain mmanged by a pain clinic in [place] , hence no need to be followed here anymore for this and she is very satisfied with current level of control and feels better about this management. Her daughter was present at the visit The PT denies any adverse reactions to current medications since the last visit.  There are no new concerns.  There are no specific complaints   ROS Denies recent fever or chills. Denies sinus pressure, nasal congestion, ear pain or sore throat. Denies chest congestion, productive cough or wheezing. Denies chest pains, palpitations and leg swelling Denies abdominal pain, nausea, vomiting,diarrhea or constipation.   Denies dysuria, frequency, hesitancy or incontinence. Denies uncontrolled  joint pain, swelling and limitation in mobility. Denies headaches, seizures, numbness, or tingling. Denies uncontrolled  depression, anxiety or insomnia. Denies skin break down or rash.   PE  BP (!) 178/100   Pulse 73   Resp 16   Ht 5\' 2"  (1.575 m)   Wt 165 lb (74.8 kg)   SpO2 95%   BMI 30.18 kg/m   Patient alert and oriented and in no cardiopulmonary distress.  HEENT: No facial asymmetry, EOMI,   oropharynx pink and moist.  Neck decreased ROM no JVD, no mass.  Chest: Clear to auscultation bilaterally.  CVS: S1, S2 no murmurs, no S3.Regular rate.  ABD: Soft non tender.   Ext: No edema  MS: Decreased  ROM spine, shoulders, hips and knees.  Skin: Intact, no ulcerations or rash noted.  Psych: Good eye contact, normal affect. Memory slightly impaired  not anxious or depressed appearing.  CNS: CN 2-12 intact, power,  normal  throughout.no focal deficits noted.   Assessment & Plan  Essential hypertension Uncontrolled , non compliantwith medications prescribed , unintentionally F/u in 3 weeks DASH diet and commitment to daily physical activity for a minimum of 30 minutes discussed and encouraged, as a part of hypertension management. The importance of attaining a healthy weight is also discussed.  BP/Weight 01/31/2018 12/19/2017 11/24/2017 11/14/2017 11/07/2017 9/37/1696 03/13/9380  Systolic BP 017 510 258 527 782 423 536  Diastolic BP 144 78 87 94 84 82 93  Wt. (Lbs) 165 167 162 162 164.75 165 165  BMI 30.18 30.54 29.63 29.63 30.13 30.18 32.22       Hyperlipidemia Hyperlipidemia:Low fat diet discussed and encouraged.   Lipid Panel  Lab Results  Component Value Date   CHOL 202 (H) 05/03/2017   HDL 52 05/03/2017   LDLCALC 116 (H) 05/03/2017   TRIG 170 (H) 05/03/2017   CHOLHDL 3.9 05/03/2017   Updated lab needed at/  next visit.Past due    Hypothyroid Updated lab needed at/ before next visit.Past due   BACK PAIN WITH RADICULOPATHY Improved management through pain clinic, continue same  Obesity (BMI 30.0-34.9) Unchanged Patient re-educated about  the importance of commitment to a  minimum of 150 minutes of exercise per week.  The importance of healthy food choices with portion control discussed.  Weight /BMI 01/31/2018 12/19/2017 11/24/2017  WEIGHT 165 lb 167 lb 162 lb  HEIGHT 5\' 2"  5\' 2"  5\' 2"   BMI 30.18 kg/m2 30.54 kg/m2 29.63 kg/m2

## 2018-02-20 NOTE — Progress Notes (Signed)
Fill Date ID Written Drug Qty Days Prescriber Rx # Pharmacy Refill Daily Dose * Pymt Type PMP  02/10/2018  1  02/10/2018  Hydrocodone-Acetamin 7.5-325  30 30 Pa Cam  [id number]  Wal ([id number])  0 7.50 MME Comm Ins  [place]  01/13/2018  1  01/13/2018  Hydrocodone-Acetamin 7.5-325  30 30 Pa Cam  [id number]  Wal ([id number])  0 7.50 MME Comm Ins  [place]  12/15/2017  1  11/14/2017  Hydrocodone-Acetamin 7.5-325  30 30 Ma Sim  [id number]  Wal ([id number])  0 7.50 MME Comm Ins  [place]  11/14/2017  1  11/14/2017  Hydrocodone-Acetamin 7.5-325  30 30 Ma Sim  [id number]  Wal ([id number])  0 7.50 MME Comm Ins  [place]  10/27/2017  1  10/10/2017  Tramadol Hcl 50 Mg Tablet  30 7 Ri Mca  [id number]  Wal (0019)  0 21.43 MME Comm Ins  [place]  08/29/2017  1  07/19/2017  Hydrocodone-Acetamin 7.5-325  30 30 Ma Sim  [id number]  Wal ([id number])  0 7.50 MME Medicare  [place]  07/19/2017  1  07/19/2017  Hydrocodone-Acetamin 7.5-325  30 30 Ma Sim  [id number]  Wal ([id number])  0 7.50 MME Medicare  [place]  06/08/2017  1  05/02/2017  Hydrocodone-Acetamin 7.5-325  30 30 Ma Sim  [id number]  Wal ([id number])  0 7.50 MME Medicare  [place]  05/02/2017  1  05/02/2017  Hydrocodone-Acetamin 7.5-325  30 30 Ma Sim  [id number]  Wal ([id number])  0 7.50 MME Comm Ins    04/09/2017  1  01/27/2017  Hydrocodone-Acetamin 7.5-325  30 30 Ma Sim  [id number]  Wal ([id number])  0 7.50 MME Comm Ins    03/01/2017  1  01/27/2017  Hydrocodone-Acetamin 7.5-325

## 2018-02-20 NOTE — Assessment & Plan Note (Signed)
Uncontrolled , non compliantwith medications prescribed , unintentionally F/u in 3 weeks DASH diet and commitment to daily physical activity for a minimum of 30 minutes discussed and encouraged, as a part of hypertension management. The importance of attaining a healthy weight is also discussed.  BP/Weight 01/31/2018 12/19/2017 11/24/2017 11/14/2017 11/07/2017 4/49/2010 0/03/1218  Systolic BP 758 832 549 826 415 830 940  Diastolic BP 768 78 87 94 84 82 93  Wt. (Lbs) 165 167 162 162 164.75 165 165  BMI 30.18 30.54 29.63 29.63 30.13 30.18 32.22

## 2018-02-21 LAB — COMPLETE METABOLIC PANEL WITH GFR
AG Ratio: 1.4 (calc) (ref 1.0–2.5)
ALT: 11 U/L (ref 6–29)
AST: 22 U/L (ref 10–35)
Albumin: 4.2 g/dL (ref 3.6–5.1)
Alkaline phosphatase (APISO): 84 U/L (ref 33–130)
BUN/Creatinine Ratio: 15 (calc) (ref 6–22)
BUN: 15 mg/dL (ref 7–25)
CO2: 31 mmol/L (ref 20–32)
Calcium: 10.2 mg/dL (ref 8.6–10.4)
Chloride: 98 mmol/L (ref 98–110)
Creat: 1.02 mg/dL — ABNORMAL HIGH (ref 0.60–0.88)
GFR, Est African American: 60 mL/min/{1.73_m2} (ref >=60)
GFR, Est Non African American: 52 mL/min/{1.73_m2} — ABNORMAL LOW (ref >=60)
Globulin: 2.9 g/dL (calc) (ref 1.9–3.7)
Glucose, Bld: 108 mg/dL — ABNORMAL HIGH (ref 65–99)
Potassium: 4.1 mmol/L (ref 3.5–5.3)
Sodium: 139 mmol/L (ref 135–146)
Total Bilirubin: 0.4 mg/dL (ref 0.2–1.2)
Total Protein: 7.1 g/dL (ref 6.1–8.1)

## 2018-02-21 LAB — HEMOGLOBIN A1C
Hgb A1c MFr Bld: 5.4 % of total Hgb (ref <5.7)
Mean Plasma Glucose: 108 (calc)
eAG (mmol/L): 6 (calc)

## 2018-02-21 LAB — LIPID PANEL
Cholesterol: 220 mg/dL — ABNORMAL HIGH (ref <200)
HDL: 55 mg/dL (ref >50)
LDL Cholesterol (Calc): 140 mg/dL (calc) — ABNORMAL HIGH
Non-HDL Cholesterol (Calc): 165 mg/dL (calc) — ABNORMAL HIGH (ref <130)
Total CHOL/HDL Ratio: 4 (calc) (ref <5.0)
Triglycerides: 128 mg/dL (ref <150)

## 2018-02-21 LAB — TSH: TSH: 4.61 mIU/L — ABNORMAL HIGH (ref 0.40–4.50)

## 2018-02-21 LAB — VITAMIN D 25 HYDROXY (VIT D DEFICIENCY, FRACTURES): Vit D, 25-Hydroxy: 44 ng/mL (ref 30–100)

## 2018-02-26 ENCOUNTER — Other Ambulatory Visit: Payer: Self-pay | Admitting: Family Medicine

## 2018-02-26 NOTE — Assessment & Plan Note (Signed)
TSH slightly elevated, no change in med dose at this time however

## 2018-02-26 NOTE — Assessment & Plan Note (Signed)
Controlled, no change in medication DASH diet and commitment to daily physical activity for a minimum of 30 minutes discussed and encouraged, as a part of hypertension management. The importance of attaining a healthy weight is also discussed.  BP/Weight 02/20/2018 01/31/2018 12/19/2017 11/24/2017 11/14/2017 11/07/2017 3/94/3200  Systolic BP 379 444 619 012 224 114 643  Diastolic BP 82 142 78 87 94 84 82  Wt. (Lbs) 163.12 165 167 162 162 164.75 165  BMI 29.84 30.18 30.54 29.63 29.63 30.13 30.18

## 2018-02-26 NOTE — Assessment & Plan Note (Signed)
Hyperlipidemia:Low fat diet discussed and encouraged.   Lipid Panel  Lab Results  Component Value Date   CHOL 220 (H) 02/20/2018   HDL 55 02/20/2018   LDLCALC 140 (H) 02/20/2018   TRIG 128 02/20/2018   CHOLHDL 4.0 02/20/2018   Uncontrolled , needs to lower fat  Intake and will be advised to inc dose of medication

## 2018-02-26 NOTE — Assessment & Plan Note (Signed)
C/o increased and uncontrolled neck and left knee pain, requests anti inflammatory meds, recommending tylenol ES

## 2018-02-26 NOTE — Progress Notes (Signed)
   [name]     MRN: [id number]      DOB: [birth year]   HPI [name] is here for follow up and re-evaluation of chronic medical conditions, esp uncontrolled hypertension, medication management and review of any available recent lab and radiology data.  Preventive health is updated, specifically  Cancer screening and Immunization.   Questions or concerns regarding consultations or procedures which the PT has had in the interim are  Addressed.She is extremely happy with her chronic pain management, but is c/o neck and knee pain, requests anti inflammatories ,  But I am deferring all pain management to pain clinic The PT denies any adverse reactions to current medications since the last visit.  Has not brought her meds, but compliance with the BP meds is evident today   ROS Denies recent fever or chills. Denies sinus pressure, nasal congestion, ear pain or sore throat. Denies chest congestion, productive cough or wheezing. Denies chest pains, palpitations and leg swelling Denies abdominal pain, nausea, vomiting,diarrhea or constipation.   Denies dysuria, frequency, hesitancy or incontinence.  Denies headaches, seizures, numbness, or tingling. Denies depression, anxiety or insomnia.c/o memory loss Denies skin break down or rash.   PE  BP 118/82   Pulse 100   Resp 16   Ht 5\' 2"  (1.575 m)   Wt 163 lb 1.9 oz (74 kg)   SpO2 97%   BMI 29.84 kg/m   Patient alert and oriented and in no cardiopulmonary distress.  HEENT: No facial asymmetry, EOMI,   oropharynx pink and moist.  Neck decreased though adequate ROM,no JVD, no mass.  Chest: Clear to auscultation bilaterally.  CVS: S1, S2 no murmurs, no S3.Regular rate.  ABD: Soft non tender.   Ext: No edema  MS: decreased ROM spine, shoulders, hips and knees.  Skin: Intact, no ulcerations or rash noted.  Psych: Good eye contact, normal affect. Memory grossly intact not anxious or depressed appearing.  CNS: CN 2-12  intact, power,  normal throughout.no focal deficits noted.   Assessment & Plan  Essential hypertension Controlled, no change in medication DASH diet and commitment to daily physical activity for a minimum of 30 minutes discussed and encouraged, as a part of hypertension management. The importance of attaining a healthy weight is also discussed.  BP/Weight 02/20/2018 01/31/2018 12/19/2017 11/24/2017 11/14/2017 11/07/2017 0/73/7106  Systolic BP 269 485 462 703 500 938 182  Diastolic BP 82 993 78 87 94 84 82  Wt. (Lbs) 163.12 165 167 162 162 164.75 165  BMI 29.84 30.18 30.54 29.63 29.63 30.13 30.18       Hyperlipidemia Hyperlipidemia:Low fat diet discussed and encouraged.   Lipid Panel  Lab Results  Component Value Date   CHOL 220 (H) 02/20/2018   HDL 55 02/20/2018   LDLCALC 140 (H) 02/20/2018   TRIG 128 02/20/2018   CHOLHDL 4.0 02/20/2018   Uncontrolled , needs to lower fat  Intake and will be advised to inc dose of medication   Hypothyroid TSH slightly elevated, no change in med dose at this time however  Osteoarthritis C/o increased and uncontrolled neck and left knee pain, requests anti inflammatory meds, recommending tylenol ES

## 2018-02-26 NOTE — Progress Notes (Signed)
ipitor

## 2018-02-28 MED ORDER — ATORVASTATIN CALCIUM 40 MG PO TABS: 40.0000 mg | ORAL_TABLET | Freq: Every day | ORAL | 3 refills | Status: DC

## 2018-03-08 ENCOUNTER — Other Ambulatory Visit: Payer: Self-pay

## 2018-03-08 MED ORDER — ISOSORBIDE MONONITRATE ER 30 MG PO TB24: 30.0000 mg | ORAL_TABLET | Freq: Every day | ORAL | 10 refills | Status: DC

## 2018-03-15 ENCOUNTER — Telehealth: Payer: Self-pay | Admitting: Family Medicine

## 2018-03-15 NOTE — Telephone Encounter (Signed)
Amy from [facility] called to ask if patient was still supposed to be taking the atorvastatin 40mg . She states she has not refilled it and doesn't remember if she is still supposed to be taking it. Contact patient

## 2018-03-17 DIAGNOSIS — M47816 Spondylosis without myelopathy or radiculopathy, lumbar region: Secondary | ICD-10-CM | POA: Diagnosis not present

## 2018-03-17 DIAGNOSIS — G894 Chronic pain syndrome: Secondary | ICD-10-CM | POA: Diagnosis not present

## 2018-03-17 DIAGNOSIS — M1710 Unilateral primary osteoarthritis, unspecified knee: Secondary | ICD-10-CM | POA: Diagnosis not present

## 2018-03-18 NOTE — Telephone Encounter (Signed)
YES she is , her cholesterol was high when checked in June and the prescription was sent in in June

## 2018-04-03 DIAGNOSIS — E039 Hypothyroidism, unspecified: Secondary | ICD-10-CM | POA: Diagnosis not present

## 2018-04-03 DIAGNOSIS — J309 Allergic rhinitis, unspecified: Secondary | ICD-10-CM | POA: Diagnosis not present

## 2018-04-03 DIAGNOSIS — Z791 Long term (current) use of non-steroidal anti-inflammatories (NSAID): Secondary | ICD-10-CM | POA: Diagnosis not present

## 2018-04-03 DIAGNOSIS — G8929 Other chronic pain: Secondary | ICD-10-CM | POA: Diagnosis not present

## 2018-04-03 DIAGNOSIS — Z79891 Long term (current) use of opiate analgesic: Secondary | ICD-10-CM | POA: Diagnosis not present

## 2018-04-03 DIAGNOSIS — I2510 Atherosclerotic heart disease of native coronary artery without angina pectoris: Secondary | ICD-10-CM | POA: Diagnosis not present

## 2018-04-03 DIAGNOSIS — Z7982 Long term (current) use of aspirin: Secondary | ICD-10-CM | POA: Diagnosis not present

## 2018-04-03 DIAGNOSIS — I10 Essential (primary) hypertension: Secondary | ICD-10-CM | POA: Diagnosis not present

## 2018-04-03 DIAGNOSIS — E785 Hyperlipidemia, unspecified: Secondary | ICD-10-CM | POA: Diagnosis not present

## 2018-04-03 DIAGNOSIS — Z803 Family history of malignant neoplasm of breast: Secondary | ICD-10-CM | POA: Diagnosis not present

## 2018-04-06 DIAGNOSIS — M1710 Unilateral primary osteoarthritis, unspecified knee: Secondary | ICD-10-CM | POA: Diagnosis not present

## 2018-04-09 ENCOUNTER — Other Ambulatory Visit: Payer: Self-pay | Admitting: Family Medicine

## 2018-04-14 DIAGNOSIS — Z79899 Other long term (current) drug therapy: Secondary | ICD-10-CM | POA: Diagnosis not present

## 2018-04-14 DIAGNOSIS — M47816 Spondylosis without myelopathy or radiculopathy, lumbar region: Secondary | ICD-10-CM | POA: Diagnosis not present

## 2018-04-14 DIAGNOSIS — M1710 Unilateral primary osteoarthritis, unspecified knee: Secondary | ICD-10-CM | POA: Diagnosis not present

## 2018-04-14 DIAGNOSIS — Z79891 Long term (current) use of opiate analgesic: Secondary | ICD-10-CM | POA: Diagnosis not present

## 2018-04-14 DIAGNOSIS — G894 Chronic pain syndrome: Secondary | ICD-10-CM | POA: Diagnosis not present

## 2018-04-14 DIAGNOSIS — M25569 Pain in unspecified knee: Secondary | ICD-10-CM | POA: Diagnosis not present

## 2018-04-19 ENCOUNTER — Telehealth: Payer: Self-pay | Admitting: Family Medicine

## 2018-04-19 NOTE — Telephone Encounter (Signed)
Pt is needing her Handicapped Placard\License renewed for the state of [place],,, Would it be the same as [place] --I told her I was not sure-- but I would ask and someone would get back in-touch with her.

## 2018-04-20 NOTE — Telephone Encounter (Signed)
She needs to get one from the [facility] and bring it in

## 2018-05-02 ENCOUNTER — Ambulatory Visit (INDEPENDENT_AMBULATORY_CARE_PROVIDER_SITE_OTHER): Payer: Medicare HMO | Admitting: Family Medicine

## 2018-05-02 ENCOUNTER — Encounter: Payer: Self-pay | Admitting: Family Medicine

## 2018-05-02 VITALS — BP 120/82 | HR 86 | Resp 16 | Ht 63.0 in | Wt 163.0 lb

## 2018-05-02 DIAGNOSIS — Z0000 Encounter for general adult medical examination without abnormal findings: Secondary | ICD-10-CM | POA: Diagnosis not present

## 2018-05-02 DIAGNOSIS — E7800 Pure hypercholesterolemia, unspecified: Secondary | ICD-10-CM

## 2018-05-02 DIAGNOSIS — Z1211 Encounter for screening for malignant neoplasm of colon: Secondary | ICD-10-CM

## 2018-05-02 DIAGNOSIS — Z23 Encounter for immunization: Secondary | ICD-10-CM | POA: Diagnosis not present

## 2018-05-02 DIAGNOSIS — I10 Essential (primary) hypertension: Secondary | ICD-10-CM

## 2018-05-02 NOTE — Patient Instructions (Addendum)
F/U end January, call if you need me sooner please  Cologuard for colon  Screening  Flu vaccine in office today  Please look for your pain medication, you should have it at home   Fasting lipid, cmp and eGFR, and TSH first week in December, please collect lab sheet before you leave today  Careful not to fall  Keep the faith!   Thank you  for choosing [facility]. We consider it a privelige to serve you.  Delivering excellent health care in a caring and  compassionate way is our goal.  Partnering with you,  so that together we can achieve this goal is our strategy.

## 2018-05-02 NOTE — Progress Notes (Signed)
    [name]     MRN: [id number]      DOB: 04/02/1934  HPI: Patient is in for annual physical exam. No other health concerns are expressed or addressed at the visit. Recent labs are reviewed. Immunization is reviewed , and  updated .   PE: BP 120/82   Pulse 86   Resp 16   Ht 5\' 3"  (1.6 m)   Wt 163 lb (73.9 kg)   SpO2 97%   BMI 28.87 kg/m   Pleasant  female, alert and oriented x 3, in no cardio-pulmonary distress. Afebrile. HEENT No facial trauma or asymetry. Sinuses non tender.  Extra occullar muscles intact,  External ears normal, tympanic membranes clear. Oropharynx moist, no exudate. Neck: supple, no adenopathy,JVD or thyromegaly.No bruits.  Chest: Clear to ascultation bilaterally.No crackles or wheezes. Non tender to palpation  Breast: No asymetry,no masses or lumps. No tenderness. No nipple discharge or inversion. No axillary or supraclavicular adenopathy  Cardiovascular system; Heart sounds normal,  S1 and  S2 ,no S3.  No murmur, or thrill. Apical beat not displaced Peripheral pulses normal.  Abdomen: Soft, non tender, no organomegaly or masses. No bruits. Bowel sounds normal. No guarding, tenderness or rebound.    GU: No exam indicated, asymptomatic  Musculoskeletal exam: Markedly decreased ROM of spine, hips , shoulders and knees. Deformity ,swelling and  crepitus noted. No muscle wasting or atrophy.   Neurologic: Cranial nerves 2 to 12 intact. Power and  tone  normal throughout. Abnormal,  gait. No tremor.  Skin: Intact, no ulceration, erythema , scaling or rash noted. Pigmentation normal throughout  Psych; Normal mood and affect. Judgement and concentration normal   Assessment & Plan:  Annual physical exam Annual exam as documented. Counseling done  re healthy lifestyle involving commitment to 150 minutes exercise per week, heart healthy diet, and attaining healthy weight.The importance of adequate sleep also  discussed. Regular seat belt use and home safety, is also discussed. Changes in health habits are decided on by the patient with goals and time frames  set for achieving them. Immunization and cancer screening needs are specifically addressed at this visit.   Need for immunization against influenza After obtaining informed consent, the vaccine is  administered by LPN.

## 2018-05-05 ENCOUNTER — Encounter: Payer: Self-pay | Admitting: Family Medicine

## 2018-05-05 NOTE — Assessment & Plan Note (Signed)

## 2018-05-05 NOTE — Assessment & Plan Note (Signed)
After obtaining informed consent, the vaccine is  administered by LPN.  

## 2018-05-08 NOTE — Telephone Encounter (Signed)
No note to be entered

## 2018-05-10 DIAGNOSIS — Z1211 Encounter for screening for malignant neoplasm of colon: Secondary | ICD-10-CM | POA: Diagnosis not present

## 2018-05-15 DIAGNOSIS — M47816 Spondylosis without myelopathy or radiculopathy, lumbar region: Secondary | ICD-10-CM | POA: Diagnosis not present

## 2018-05-15 DIAGNOSIS — G894 Chronic pain syndrome: Secondary | ICD-10-CM | POA: Diagnosis not present

## 2018-05-15 DIAGNOSIS — Z79899 Other long term (current) drug therapy: Secondary | ICD-10-CM | POA: Diagnosis not present

## 2018-05-15 DIAGNOSIS — Z79891 Long term (current) use of opiate analgesic: Secondary | ICD-10-CM | POA: Diagnosis not present

## 2018-05-15 DIAGNOSIS — M1711 Unilateral primary osteoarthritis, right knee: Secondary | ICD-10-CM | POA: Diagnosis not present

## 2018-05-18 ENCOUNTER — Other Ambulatory Visit: Payer: Self-pay | Admitting: Family Medicine

## 2018-05-18 LAB — COLOGUARD: Cologuard: NEGATIVE

## 2018-06-12 DIAGNOSIS — Z79891 Long term (current) use of opiate analgesic: Secondary | ICD-10-CM | POA: Diagnosis not present

## 2018-06-12 DIAGNOSIS — M47816 Spondylosis without myelopathy or radiculopathy, lumbar region: Secondary | ICD-10-CM | POA: Diagnosis not present

## 2018-06-12 DIAGNOSIS — G894 Chronic pain syndrome: Secondary | ICD-10-CM | POA: Diagnosis not present

## 2018-06-12 DIAGNOSIS — Z79899 Other long term (current) drug therapy: Secondary | ICD-10-CM | POA: Diagnosis not present

## 2018-06-12 DIAGNOSIS — M1711 Unilateral primary osteoarthritis, right knee: Secondary | ICD-10-CM | POA: Diagnosis not present

## 2018-07-08 ENCOUNTER — Other Ambulatory Visit: Payer: Self-pay | Admitting: Family Medicine

## 2018-08-09 ENCOUNTER — Telehealth: Payer: Self-pay | Admitting: Family Medicine

## 2018-08-09 NOTE — Telephone Encounter (Signed)
FMLA for Daughter   Copied Sleeved Noted   $29 collected

## 2018-08-10 ENCOUNTER — Telehealth: Payer: Self-pay

## 2018-08-10 ENCOUNTER — Telehealth: Payer: Self-pay | Admitting: *Deleted

## 2018-08-10 DIAGNOSIS — R39198 Other difficulties with micturition: Secondary | ICD-10-CM

## 2018-08-10 NOTE — Telephone Encounter (Signed)
Pt wanted to know if something could be called in for UTI. She has the urge to go but just cant. Did not start until yesterday.

## 2018-08-10 NOTE — Telephone Encounter (Signed)
Advised patient she needs to leave urine sample at lab with verbal understanding.

## 2018-08-10 NOTE — Telephone Encounter (Signed)
Send urine for UA and reflex c/s today or tomorrow pls , once I see the UA will treat

## 2018-08-10 NOTE — Telephone Encounter (Signed)
Urine with reflex c/s ordered per MD

## 2018-08-11 DIAGNOSIS — R39198 Other difficulties with micturition: Secondary | ICD-10-CM | POA: Diagnosis not present

## 2018-08-13 ENCOUNTER — Other Ambulatory Visit: Payer: Self-pay | Admitting: Family Medicine

## 2018-08-13 MED ORDER — CIPROFLOXACIN HCL 500 MG PO TABS: ORAL_TABLET | ORAL | 0 refills | Status: AC

## 2018-08-14 LAB — URINALYSIS W MICROSCOPIC + REFLEX CULTURE
Bacteria, UA: NONE SEEN /HPF
Bilirubin Urine: NEGATIVE
Glucose, UA: NEGATIVE
Hyaline Cast: NONE SEEN /LPF
Ketones, ur: NEGATIVE
Nitrites, Initial: NEGATIVE
Specific Gravity, Urine: 1.02 (ref 1.001–1.03)
Squamous Epithelial / LPF: NONE SEEN /HPF (ref <=5)
WBC, UA: >=60 /HPF — AB (ref 0–5)
pH: >=8.5 — AB (ref 5.0–8.0)

## 2018-08-14 LAB — URINE CULTURE
MICRO NUMBER:: 91468903
SPECIMEN QUALITY:: ADEQUATE

## 2018-08-14 LAB — CULTURE INDICATED

## 2018-08-17 DIAGNOSIS — Z1211 Encounter for screening for malignant neoplasm of colon: Secondary | ICD-10-CM

## 2018-08-17 DIAGNOSIS — E7800 Pure hypercholesterolemia, unspecified: Secondary | ICD-10-CM

## 2018-08-17 DIAGNOSIS — I10 Essential (primary) hypertension: Secondary | ICD-10-CM

## 2018-08-23 DIAGNOSIS — M47816 Spondylosis without myelopathy or radiculopathy, lumbar region: Secondary | ICD-10-CM | POA: Diagnosis not present

## 2018-08-23 DIAGNOSIS — G894 Chronic pain syndrome: Secondary | ICD-10-CM | POA: Diagnosis not present

## 2018-08-23 DIAGNOSIS — M1710 Unilateral primary osteoarthritis, unspecified knee: Secondary | ICD-10-CM | POA: Diagnosis not present

## 2018-08-23 DIAGNOSIS — M1711 Unilateral primary osteoarthritis, right knee: Secondary | ICD-10-CM | POA: Diagnosis not present

## 2018-08-23 DIAGNOSIS — Z79899 Other long term (current) drug therapy: Secondary | ICD-10-CM | POA: Diagnosis not present

## 2018-08-23 DIAGNOSIS — Z79891 Long term (current) use of opiate analgesic: Secondary | ICD-10-CM | POA: Diagnosis not present

## 2018-09-27 DIAGNOSIS — M47816 Spondylosis without myelopathy or radiculopathy, lumbar region: Secondary | ICD-10-CM | POA: Diagnosis not present

## 2018-09-27 DIAGNOSIS — G894 Chronic pain syndrome: Secondary | ICD-10-CM | POA: Diagnosis not present

## 2018-09-27 DIAGNOSIS — M1711 Unilateral primary osteoarthritis, right knee: Secondary | ICD-10-CM | POA: Diagnosis not present

## 2018-10-02 ENCOUNTER — Encounter: Payer: Self-pay | Admitting: Family Medicine

## 2018-10-02 ENCOUNTER — Ambulatory Visit (INDEPENDENT_AMBULATORY_CARE_PROVIDER_SITE_OTHER): Payer: Medicare HMO | Admitting: Family Medicine

## 2018-10-02 VITALS — BP 116/64 | HR 105 | Resp 12 | Ht 63.0 in | Wt 160.0 lb

## 2018-10-02 DIAGNOSIS — E7800 Pure hypercholesterolemia, unspecified: Secondary | ICD-10-CM

## 2018-10-02 DIAGNOSIS — E663 Overweight: Secondary | ICD-10-CM | POA: Diagnosis not present

## 2018-10-02 DIAGNOSIS — E039 Hypothyroidism, unspecified: Secondary | ICD-10-CM

## 2018-10-02 DIAGNOSIS — I2510 Atherosclerotic heart disease of native coronary artery without angina pectoris: Secondary | ICD-10-CM

## 2018-10-02 DIAGNOSIS — R7301 Impaired fasting glucose: Secondary | ICD-10-CM

## 2018-10-02 DIAGNOSIS — I10 Essential (primary) hypertension: Secondary | ICD-10-CM | POA: Diagnosis not present

## 2018-10-02 DIAGNOSIS — R7303 Prediabetes: Secondary | ICD-10-CM | POA: Diagnosis not present

## 2018-10-02 NOTE — Patient Instructions (Addendum)
Wellness with nurse due in April , please schedule  Please get fasting lipid, cmp and EgFR, and HBA1Cand TSH as soon as possible   Physical exam with MD early September  Be careful not to fall.  Thank you  for choosing  Primary Care. We consider it a privelige to serve you.  Delivering excellent health care in a caring and  compassionate way is our goal.  Partnering with you,  so that together we can achieve this goal is our strategy.

## 2018-10-03 DIAGNOSIS — R7301 Impaired fasting glucose: Secondary | ICD-10-CM | POA: Diagnosis not present

## 2018-10-03 DIAGNOSIS — E7800 Pure hypercholesterolemia, unspecified: Secondary | ICD-10-CM | POA: Diagnosis not present

## 2018-10-03 DIAGNOSIS — I10 Essential (primary) hypertension: Secondary | ICD-10-CM | POA: Diagnosis not present

## 2018-10-03 LAB — COMPLETE METABOLIC PANEL WITH GFR
AG Ratio: 1.4 (calc) (ref 1.0–2.5)
ALT: 11 U/L (ref 6–29)
AST: 21 U/L (ref 10–35)
Albumin: 4 g/dL (ref 3.6–5.1)
Alkaline phosphatase (APISO): 102 U/L (ref 33–130)
BUN/Creatinine Ratio: 17 (calc) (ref 6–22)
BUN: 17 mg/dL (ref 7–25)
CO2: 31 mmol/L (ref 20–32)
Calcium: 9.7 mg/dL (ref 8.6–10.4)
Chloride: 100 mmol/L (ref 98–110)
Creat: 1.01 mg/dL — ABNORMAL HIGH (ref 0.60–0.88)
GFR, Est African American: 60 mL/min/{1.73_m2} (ref >=60)
GFR, Est Non African American: 52 mL/min/{1.73_m2} — ABNORMAL LOW (ref >=60)
Globulin: 2.8 g/dL (calc) (ref 1.9–3.7)
Glucose, Bld: 96 mg/dL (ref 65–99)
Potassium: 4 mmol/L (ref 3.5–5.3)
Sodium: 139 mmol/L (ref 135–146)
Total Bilirubin: 0.7 mg/dL (ref 0.2–1.2)
Total Protein: 6.8 g/dL (ref 6.1–8.1)

## 2018-10-03 LAB — LIPID PANEL
Cholesterol: 140 mg/dL (ref <200)
HDL: 48 mg/dL — ABNORMAL LOW (ref >50)
LDL Cholesterol (Calc): 72 mg/dL (calc)
Non-HDL Cholesterol (Calc): 92 mg/dL (calc) (ref <130)
Total CHOL/HDL Ratio: 2.9 (calc) (ref <5.0)
Triglycerides: 118 mg/dL (ref <150)

## 2018-10-03 LAB — HEMOGLOBIN A1C
Hgb A1c MFr Bld: 5.4 % of total Hgb (ref <5.7)
Mean Plasma Glucose: 108 (calc)
eAG (mmol/L): 6 (calc)

## 2018-10-03 LAB — TSH: TSH: 3.59 mIU/L (ref 0.40–4.50)

## 2018-10-20 NOTE — Assessment & Plan Note (Signed)
Hyperlipidemia:Low fat diet discussed and encouraged.   Lipid Panel  Lab Results  Component Value Date   CHOL 140 10/03/2018   HDL 48 (L) 10/03/2018   LDLCALC 72 10/03/2018   TRIG 118 10/03/2018   CHOLHDL 2.9 10/03/2018     Controlled, no change in medication

## 2018-10-20 NOTE — Assessment & Plan Note (Signed)
Improved, she is congratulated on this.  Patient re-educated about  the importance of commitment to a  minimum of 150 minutes of exercise per week as able.  The importance of healthy food choices with portion control discussed, as well as eating regularly and within a 12 hour window most days. The need to choose "clean , green" food 50 to 75% of the time is discussed, as well as to make water the primary drink and set a goal of 64 ounces water daily.  Encouraged to start a food diary,  and to consider  joining a support group. Sample diet sheets offered. Goals set by the patient for the next several months.   Weight /BMI 10/02/2018 05/02/2018 02/20/2018  WEIGHT 160 lb 163 lb 163 lb 1.9 oz  HEIGHT 5\' 3"  5\' 3"  5\' 2"   BMI 28.34 kg/m2 28.87 kg/m2 29.84 kg/m2

## 2018-10-20 NOTE — Assessment & Plan Note (Signed)
Controlled, no change in medication DASH diet and commitment to daily physical activity for a minimum of 30 minutes discussed and encouraged, as a part of hypertension management. The importance of attaining a healthy weight is also discussed.  BP/Weight 10/02/2018 05/02/2018 02/20/2018 01/31/2018 12/19/2017 11/24/2017 4/38/3779  Systolic BP 396 886 484 720 721 828 833  Diastolic BP 64 82 82 744 78 87 94  Wt. (Lbs) 160 163 163.12 165 167 162 162  BMI 28.34 28.87 29.84 30.18 30.54 29.63 29.63

## 2018-10-20 NOTE — Assessment & Plan Note (Signed)
Asymptomatic and CV exam is normal

## 2018-10-20 NOTE — Assessment & Plan Note (Signed)
Managed by pain management and she is extremely pleased with her care registry reviewed and she is compliant

## 2018-10-20 NOTE — Progress Notes (Signed)
[name]     MRN: [id number]      DOB: [birth year]   HPI [name] is here for follow up and re-evaluation of chronic medical conditions, medication management and review of any available recent lab and radiology data.  Preventive health is updated, specifically  Cancer screening and Immunization.   Questions or concerns regarding consultations or procedures which the PT has had in the interim are  Addressed.Very pleased with pain management team The PT denies any adverse reactions to current medications since the last visit.  There are no new concerns.  There are no specific complaints   ROS Denies recent fever or chills. Denies sinus pressure, nasal congestion, ear pain or sore throat. Denies chest congestion, productive cough or wheezing. Denies chest pains, palpitations and leg swelling Denies abdominal pain, nausea, vomiting,diarrhea or constipation.   Denies dysuria, frequency, hesitancy or incontinence. Denies uncontrolled joint pain, swelling and  Does c/o limitation in mobility. Denies headaches, seizures, numbness, or tingling. Denies depression, anxiety or insomnia. Denies skin break down or rash.   PE  BP 116/64   Pulse (!) 105   Resp 12   Ht 5\' 3"  (1.6 m)   Wt 160 lb (72.6 kg)   SpO2 95% Comment: room air  BMI 28.34 kg/m   Patient alert and oriented and in no cardiopulmonary distress.  HEENT: No facial asymmetry, EOMI,   oropharynx pink and moist.  Neck supple no JVD, no mass.  Chest: Clear to auscultation bilaterally.  CVS: S1, S2 no murmurs, no S3.Regular rate.  ABD: Soft non tender.   Ext: No edema  MS: Adequate ROM spine, shoulders, hips and knees.  Skin: Intact, no ulcerations or rash noted.  Psych: Good eye contact, normal affect. Memory intact not anxious or depressed appearing.  CNS: CN 2-12 intact, power,  normal throughout.no focal deficits noted.   Assessment & Plan  Essential hypertension Controlled, no change in  medication DASH diet and commitment to daily physical activity for a minimum of 30 minutes discussed and encouraged, as a part of hypertension management. The importance of attaining a healthy weight is also discussed.  BP/Weight 10/02/2018 05/02/2018 02/20/2018 01/31/2018 12/19/2017 11/24/2017 01/06/7740  Systolic BP 287 867 672 094 709 628 366  Diastolic BP 64 82 82 294 78 87 94  Wt. (Lbs) 160 163 163.12 165 167 162 162  BMI 28.34 28.87 29.84 30.18 30.54 29.63 29.63       Hypothyroid Controlled, no change in medication Updated lab needed at/ before next visit.   Hyperlipidemia Hyperlipidemia:Low fat diet discussed and encouraged.   Lipid Panel  Lab Results  Component Value Date   CHOL 140 10/03/2018   HDL 48 (L) 10/03/2018   LDLCALC 72 10/03/2018   TRIG 118 10/03/2018   CHOLHDL 2.9 10/03/2018     Controlled, no change in medication   Overweight (BMI 25.0-29.9) Improved, she is congratulated on this.  Patient re-educated about  the importance of commitment to a  minimum of 150 minutes of exercise per week as able.  The importance of healthy food choices with portion control discussed, as well as eating regularly and within a 12 hour window most days. The need to choose "clean , green" food 50 to 75% of the time is discussed, as well as to make water the primary drink and set a goal of 64 ounces water daily.  Encouraged to start a food diary,  and to consider  joining a support group. Sample diet sheets offered. Goals  set by the patient for the next several months.   Weight /BMI 10/02/2018 05/02/2018 02/20/2018  WEIGHT 160 lb 163 lb 163 lb 1.9 oz  HEIGHT 5\' 3"  5\' 3"  5\' 2"   BMI 28.34 kg/m2 28.87 kg/m2 29.84 kg/m2      BACK PAIN WITH RADICULOPATHY Managed by pain management and she is extremely pleased with her care registry reviewed and she is compliant  CAD in native artery Asymptomatic and CV exam is normal

## 2018-10-20 NOTE — Assessment & Plan Note (Signed)
Controlled, no change in medication Updated lab needed at/ before next visit.  

## 2018-10-25 DIAGNOSIS — M1711 Unilateral primary osteoarthritis, right knee: Secondary | ICD-10-CM | POA: Diagnosis not present

## 2018-10-25 DIAGNOSIS — M47816 Spondylosis without myelopathy or radiculopathy, lumbar region: Secondary | ICD-10-CM | POA: Diagnosis not present

## 2018-10-25 DIAGNOSIS — G894 Chronic pain syndrome: Secondary | ICD-10-CM | POA: Diagnosis not present

## 2018-11-05 ENCOUNTER — Other Ambulatory Visit: Payer: Self-pay | Admitting: Family Medicine

## 2018-11-06 ENCOUNTER — Other Ambulatory Visit (HOSPITAL_COMMUNITY): Payer: Self-pay | Admitting: Family Medicine

## 2018-11-06 DIAGNOSIS — Z1231 Encounter for screening mammogram for malignant neoplasm of breast: Secondary | ICD-10-CM

## 2018-11-07 ENCOUNTER — Other Ambulatory Visit: Payer: Self-pay | Admitting: Family Medicine

## 2018-12-19 DIAGNOSIS — M47816 Spondylosis without myelopathy or radiculopathy, lumbar region: Secondary | ICD-10-CM | POA: Diagnosis not present

## 2018-12-19 DIAGNOSIS — G894 Chronic pain syndrome: Secondary | ICD-10-CM | POA: Diagnosis not present

## 2018-12-19 DIAGNOSIS — M1710 Unilateral primary osteoarthritis, unspecified knee: Secondary | ICD-10-CM | POA: Diagnosis not present

## 2018-12-19 DIAGNOSIS — M1711 Unilateral primary osteoarthritis, right knee: Secondary | ICD-10-CM | POA: Diagnosis not present

## 2018-12-20 ENCOUNTER — Ambulatory Visit (HOSPITAL_COMMUNITY): Payer: Medicare HMO

## 2018-12-22 ENCOUNTER — Other Ambulatory Visit: Payer: Self-pay

## 2018-12-22 ENCOUNTER — Ambulatory Visit (INDEPENDENT_AMBULATORY_CARE_PROVIDER_SITE_OTHER): Payer: Medicare HMO | Admitting: Family Medicine

## 2018-12-22 ENCOUNTER — Encounter: Payer: Self-pay | Admitting: Family Medicine

## 2018-12-22 DIAGNOSIS — Z0000 Encounter for general adult medical examination without abnormal findings: Secondary | ICD-10-CM

## 2018-12-22 NOTE — Patient Instructions (Addendum)
Thank you for completing your annual wellness visit via telephone today.. I appreciate the opportunity to provide you with the care for your health and wellness. Today we discussed: [facility] health and wellness with prevention and screening.  Attached some preventative information for your review.  Please let us know if you have any questions or concerns we are always here for you.  [place] YOUR HANDS WELL AND FREQUENTLY. AVOID TOUCHING YOUR FACE, UNLESS YOUR HANDS ARE FRESHLY WASHED.  GET FRESH AIR DAILY. STAY HYDRATED WITH WATER.   It was a pleasure to see you and I look forward to continuing to work together on your health and well-being.Please do not hesitate to call the office if you need care or have questions about your care.  Have a wonderful day and week. With Gratitude, Cherly Beach, DNP, AGNP-BC  Ms. Dejonge , Thank you for taking time to come for your Medicare Wellness Visit. I appreciate your ongoing commitment to your health goals. Please review the following plan we discussed and let me know if I can assist you in the future.   Screening recommendations/referrals: Recommended yearly ophthalmology/optometry visit for glaucoma screening and checkup Recommended yearly dental visit for hygiene and checkup  Preventive Care 83 Years and Older, Female Preventive care refers to lifestyle choices and visits with your health care provider that can promote health and wellness. What does preventive care include?  A yearly physical exam. This is also called an annual well check.  Dental exams once or twice a year.  Routine eye exams. Ask your health care provider how often you should have your eyes checked.  Personal lifestyle choices, including:  Daily care of your teeth and gums.  Regular physical activity.  Eating a healthy diet.  Avoiding tobacco and drug use.  Limiting alcohol use.  Practicing safe sex.  Taking low-dose aspirin every day.  Taking vitamin and  mineral supplements as recommended by your health care provider. What happens during an annual well check? The services and screenings done by your health care provider during your annual well check will depend on your age, overall health, lifestyle risk factors, and family history of disease. Counseling  Your health care provider may ask you questions about your:  Alcohol use.  Tobacco use.  Drug use.  Emotional well-being.  Home and relationship well-being.  Sexual activity.  Eating habits.  History of falls.  Memory and ability to understand (cognition).  Work and work Statistician.  Reproductive health. Screening  You may have the following tests or measurements:  Height, weight, and BMI.  Blood pressure.  Lipid and cholesterol levels. These may be checked every 5 years, or more frequently if you are over 70 years old.  Skin check.  Lung cancer screening. You may have this screening every year starting at age 83 if you have a 30-pack-year history of smoking and currently smoke or have quit within the past 15 years.  Fecal occult blood test (FOBT) of the stool. You may have this test every year starting at age 83.  Flexible sigmoidoscopy or colonoscopy. You may have a sigmoidoscopy every 5 years or a colonoscopy every 10 years starting at age 83.  Hepatitis C blood test.  Hepatitis B blood test.  Sexually transmitted disease (STD) testing.  Diabetes screening. This is done by checking your blood sugar (glucose) after you have not eaten for a while (fasting). You may have this done every 1-3 years.  Bone density scan. This is done to screen  for osteoporosis. You may have this done starting at age 83.  Mammogram. This may be done every 1-2 years. Talk to your health care provider about how often you should have regular mammograms. Talk with your health care provider about your test results, treatment options, and if necessary, the need for more tests. Vaccines   Your health care provider may recommend certain vaccines, such as:  Influenza vaccine. This is recommended every year.  Tetanus, diphtheria, and acellular pertussis (Tdap, Td) vaccine. You may need a Td booster every 10 years.  Zoster vaccine. You may need this after age 83.  Pneumococcal 13-valent conjugate (PCV13) vaccine. One dose is recommended after age 83.  Pneumococcal polysaccharide (PPSV23) vaccine. One dose is recommended after age 83. Talk to your health care provider about which screenings and vaccines you need and how often you need them. This information is not intended to replace advice given to you by your health care provider. Make sure you discuss any questions you have with your health care provider. Document Released: 09/19/2015 Document Revised: 05/12/2016 Document Reviewed: 06/24/2015 Elsevier Interactive Patient Education  2017 [employer] Prevention in the Home Falls can cause injuries. They can happen to people of all ages. There are many things you can do to make your home safe and to help prevent falls. What can I do on the outside of my home?  Regularly fix the edges of walkways and driveways and fix any cracks.  Remove anything that might make you trip as you walk through a door, such as a raised step or threshold.  Trim any bushes or trees on the path to your home.  Use bright outdoor lighting.  Clear any walking paths of anything that might make someone trip, such as rocks or tools.  Regularly check to see if handrails are loose or broken. Make sure that both sides of any steps have handrails.  Any raised decks and porches should have guardrails on the edges.  Have any leaves, snow, or ice cleared regularly.  Use sand or salt on walking paths during winter.  Clean up any spills in your garage right away. This includes oil or grease spills. What can I do in the bathroom?  Use night lights.  Install grab bars by the toilet and in the  tub and shower. Do not use towel bars as grab bars.  Use non-skid mats or decals in the tub or shower.  If you need to sit down in the shower, use a plastic, non-slip stool.  Keep the floor dry. Clean up any water that spills on the floor as soon as it happens.  Remove soap buildup in the tub or shower regularly.  Attach bath mats securely with double-sided non-slip rug tape.  Do not have throw rugs and other things on the floor that can make you trip. What can I do in the bedroom?  Use night lights.  Make sure that you have a light by your bed that is easy to reach.  Do not use any sheets or blankets that are too big for your bed. They should not hang down onto the floor.  Have a firm chair that has side arms. You can use this for support while you get dressed.  Do not have throw rugs and other things on the floor that can make you trip. What can I do in the kitchen?  Clean up any spills right away.  Avoid walking on wet floors.  Keep items that you  use a lot in easy-to-reach places.  If you need to reach something above you, use a strong step stool that has a grab bar.  Keep electrical cords out of the way.  Do not use floor polish or wax that makes floors slippery. If you must use wax, use non-skid floor wax.  Do not have throw rugs and other things on the floor that can make you trip. What can I do with my stairs?  Do not leave any items on the stairs.  Make sure that there are handrails on both sides of the stairs and use them. Fix handrails that are broken or loose. Make sure that handrails are as long as the stairways.  Check any carpeting to make sure that it is firmly attached to the stairs. Fix any carpet that is loose or worn.  Avoid having throw rugs at the top or bottom of the stairs. If you do have throw rugs, attach them to the floor with carpet tape.  Make sure that you have a light switch at the top of the stairs and the bottom of the stairs. If you  do not have them, ask someone to add them for you. What else can I do to help prevent falls?  Wear shoes that:  Do not have high heels.  Have rubber bottoms.  Are comfortable and fit you well.  Are closed at the toe. Do not wear sandals.  If you use a stepladder:  Make sure that it is fully opened. Do not climb a closed stepladder.  Make sure that both sides of the stepladder are locked into place.  Ask someone to hold it for you, if possible.  Clearly mark and make sure that you can see:  Any grab bars or handrails.  First and last steps.  Where the edge of each step is.  Use tools that help you move around (mobility aids) if they are needed. These include:  Canes.  Walkers.  Scooters.  Crutches.  Turn on the lights when you go into a dark area. Replace any light bulbs as soon as they burn out.  Set up your furniture so you have a clear path. Avoid moving your furniture around.  If any of your floors are uneven, fix them.  If there are any pets around you, be aware of where they are.  Review your medicines with your doctor. Some medicines can make you feel dizzy. This can increase your chance of falling. Ask your doctor what other things that you can do to help prevent falls. This information is not intended to replace advice given to you by your health care provider. Make sure you discuss any questions you have with your health care provider. Document Released: 06/19/2009 Document Revised: 01/29/2016 Document Reviewed: 09/27/2014 Elsevier Interactive Patient Education  2017 [employer].    ;

## 2018-12-22 NOTE — Progress Notes (Signed)
Subjective:   [name] is a 85 y.o. female who presents for Medicare Annual (Subsequent) preventive examination.  Location of Patient: Home Location of Provider: Telehealth Consent was obtain for visit to be over via telehealth.  Review of Systems:   Cardiac Risk Factors include: advanced age (>36men, >41 women);dyslipidemia;hypertension     Objective:     Vitals: There were no vitals taken for this visit.  There is no height or weight on file to calculate BMI.  Advanced Directives 12/22/2018 12/22/2018 12/19/2017 09/09/2017 09/09/2017 10/16/2016 04/16/2016  Does Patient Have a Medical Advance Directive? Yes Yes No No No No No  Does patient want to make changes to medical advance directive? - No - Patient declined - - - - -  Would patient like information on creating a medical advance directive? - - Yes (MAU/Ambulatory/Procedural Areas - Information given) - - - No - patient declined information  Pre-existing out of facility DNR order (yellow form or pink MOST form) - - - - - - -    Tobacco Social History   Tobacco Use  Smoking Status Former Smoker  . Types: Cigarettes  . Last attempt to quit: 09/06/1984  . Years since quitting: 34.3  Smokeless Tobacco Never Used     Counseling given: Not Answered   Clinical Intake:  Pre-visit preparation completed: No  Pain : 0-10 Pain Score: 7  Pain Type: Chronic pain Pain Location: Back Pain Orientation: Lower Pain Descriptors / Indicators: Throbbing Pain Onset: More than a month ago Pain Frequency: Constant     Diabetes: No  How often do you need to have someone help you when you read instructions, pamphlets, or other written materials from your doctor or pharmacy?: 1 - Never What is the last grade level you completed in school?: 12th     Information entered by :: brandi hudy   Past Medical History:  Diagnosis Date  . Anemia   . Blood clot associated with vein wall inflammation    hx of in right leg  . CAD  (coronary artery disease)   . Carpal tunnel syndrome on left    Left Hand pain with numbness, new onset   . Chronic back pain   . Chronic back pain    buldging disc  . Constipation   . Diarrhea   . Difficult intubation    small airway  . DJD (degenerative joint disease)    Severe  . Gastric ulcer    hx of   . GERD (gastroesophageal reflux disease)    takes Omeprazole daily  . Headache(784.0)    occasionally  . Hyperlipidemia    takes Lipitor nightly  . Hypertension    takes Amlodipine and Metoprolol daily  . Nocturia   . Obesity   . Pneumonia    hx of-in 2011  . Shortness of breath    with exertion  . Sleep apnea    sleep study done 51yrs ago  . Thyroid mass    right   Past Surgical History:  Procedure Laterality Date  . ABDOMINAL HYSTERECTOMY    . APPENDECTOMY  1987  . Arthoscopy left knee  1992  . Arthroscopy left shoulder  1999  . Back  Surgery  lumbar  1989 /1999   x 2  . BACK SURGERY    . CARDIAC CATHETERIZATION  05/2010  . CARDIAC CATHETERIZATION N/A 09/30/2015   Procedure: Left Heart Cath and Coronary Angiography;  Surgeon: Troy Sine, MD;  Location: [facility];  Service: Cardiovascular;  Laterality: N/A;  . Carpal tunnel release right     x 2  . CHOLECYSTECTOMY    . COLONOSCOPY    . ESOPHAGOGASTRODUODENOSCOPY    . LUMBAR FUSION  March 19, 2014  . NM MYOCAR PERF WALL MOTION  11/12/2008   no ischemia  . PARTIAL HYSTERECTOMY  1972  . ROTATOR CUFF REPAIR     right  . SPINE SURGERY    . THYROIDECTOMY  11/17/2011   Procedure: THYROIDECTOMY;  Surgeon: Ascencion Dike, MD;  Location: [facility];  Service: ENT;  Laterality: Right;  WITH FROZEN SECTION  . TONSILLECTOMY     Family History  Problem Relation Age of Onset  . Anuerysm Mother        died at 95  . Stroke Father        67 at death   . Anesthesia problems Neg Hx   . Hypotension Neg Hx   . Malignant hyperthermia Neg Hx   . Pseudochol deficiency Neg Hx   . Allergic rhinitis Neg Hx   . Angioedema  Neg Hx   . Asthma Neg Hx   . Atopy Neg Hx   . Eczema Neg Hx   . Immunodeficiency Neg Hx   . Urticaria Neg Hx    Social History   Socioeconomic History  . Marital status: Widowed    Spouse name: Not on file  . Number of children: 4  . Years of education: Not on file  . Highest education level: Not on file  Occupational History    Employer: RETIRED  Social Needs  . Financial resource strain: Not hard at all  . Food insecurity:    Worry: Never true    Inability: Never true  . Transportation needs:    Medical: No    Non-medical: No  Tobacco Use  . Smoking status: Former Smoker    Types: Cigarettes    Last attempt to quit: 09/06/1984    Years since quitting: 34.3  . Smokeless tobacco: Never Used  Substance and Sexual Activity  . Alcohol use: No    Comment: Casual  . Drug use: No  . Sexual activity: Never    Birth control/protection: Surgical  Lifestyle  . Physical activity:    Days per week: 0 days    Minutes per session: 0 min  . Stress: Not at all  Relationships  . Social connections:    Talks on phone: More than three times a week    Gets together: Twice a week    Attends religious service: More than 4 times per year    Active member of club or organization: No    Attends meetings of clubs or organizations: Never    Relationship status: Divorced  Other Topics Concern  . Not on file  Social History Narrative  . Not on file    Outpatient Encounter Medications as of 12/22/2018  Medication Sig  . amLODipine (NORVASC) 10 MG tablet Take 1 tablet (10 mg total) by mouth daily.  . Ascorbic Acid (VITAMIN C) 500 MG tablet Take 500 mg by mouth daily.   Marland Kitchen aspirin EC 81 MG tablet Take 81 mg by mouth daily.  Marland Kitchen atorvastatin (LIPITOR) 40 MG tablet Take 1 tablet (40 mg total) by mouth daily at 6 PM.  . calcium carbonate (OS-CAL - DOSED IN MG OF ELEMENTAL CALCIUM) 1250 MG tablet Take 1 tablet by mouth daily.  . Cyanocobalamin (B-12) 1000 MCG CAPS Take 1 capsule by mouth daily.    Marland Kitchen  diclofenac sodium (VOLTAREN) 1 % GEL Apply 1 application topically 4 (four) times daily. (Patient taking differently: Apply 2 g topically 2 (two) times daily as needed (for back pain). )  . EPIPEN 2-PAK 0.3 MG/0.3ML SOAJ injection inject as directed for SEVERE ALLERGIC REACTIONS  . ferrous sulfate 325 (65 FE) MG tablet Take 325 mg by mouth daily with breakfast.   . fluticasone (FLONASE) 50 MCG/ACT nasal spray Place 1 spray into both nostrils daily. (Patient taking differently: Place 1 spray into both nostrils once as needed for allergies. )  . hydrochlorothiazide (MICROZIDE) 12.5 MG capsule TAKE 1 CAPSULE(12.5 MG) BY MOUTH DAILY  . HYDROcodone-acetaminophen (NORCO) 7.5-325 MG tablet Take 1 tablet by mouth daily as needed for moderate pain.  . isosorbide mononitrate (IMDUR) 30 MG 24 hr tablet Take 1 tablet (30 mg total) by mouth daily.  Marland Kitchen levothyroxine (SYNTHROID, LEVOTHROID) 25 MCG tablet TAKE 1 TABLET BY MOUTH EVERY MORNING  . Multiple Vitamin (MULITIVITAMIN WITH MINERALS) TABS Take 1 tablet by mouth daily.  . nitroGLYCERIN (NITROLINGUAL) 0.4 MG/SPRAY spray Place 1 spray every 5 (five) minutes x 3 doses as needed under the tongue for chest pain.  . potassium chloride (K-DUR) 10 MEQ tablet TAKE 1 TABLET(10 MEQ) BY MOUTH DAILY  . [DISCONTINUED] diclofenac (VOLTAREN) 75 MG EC tablet TAKE 1 TABLET BY MOUTH EVERY DAY FOR RIGHT KNEE PAIN(MAXIMUM OF 2 TABLETS IN ONE WEEK)   No facility-administered encounter medications on file as of 12/22/2018.     Activities of Daily Living In your present state of health, do you have any difficulty performing the following activities: 12/22/2018  Hearing? N  Vision? N  Difficulty concentrating or making decisions? N  Walking or climbing stairs? Y  Dressing or bathing? N  Doing errands, shopping? N  Preparing Food and eating ? N  Using the Toilet? N  Managing your Medications? N  Managing your Finances? N  Housekeeping or managing your Housekeeping? N   Some recent data might be hidden    Patient Care Team: Fayrene Helper, MD as PCP - General Leta Baptist, MD as Attending Physician (Otolaryngology) Gala Romney Cristopher Estimable, MD as Attending Physician (Gastroenterology) Latanya Maudlin, MD as Consulting Physician (Orthopedic Surgery) Sanda Klein, MD as Consulting Physician (Cardiology)    Assessment:   This is a routine wellness examination for [name].  Exercise Activities and Dietary recommendations Current Exercise Habits: The patient does not participate in regular exercise at present, Exercise limited by: orthopedic condition(s)  Goals    . LIFESTYLE - DECREASE FALLS RISK    . Prevent falls     Uses cane when ambulating since she is alone a lot of the time at home        Fall Risk Fall Risk  12/22/2018 10/20/2018 10/02/2018 05/02/2018 02/20/2018  Falls in the past year? 0 - 0 No Yes  Number falls in past yr: 0 - 0 - 1  Injury with Fall? 0 - 0 - -  Risk for fall due to : - History of fall(s);Impaired balance/gait;Impaired mobility - - -   Is the patient's home free of loose throw rugs in walkways, pet beds, electrical cords, etc?   yes      Grab bars in the bathroom? yes      Handrails on the stairs?   yes      Adequate lighting?   yes  Timed Get Up and Go performed: unable to assess   Depression Screen PHQ 2/9 Scores 12/22/2018 10/02/2018 05/02/2018 02/20/2018  PHQ -  2 Score 1 0 2 3  PHQ- 9 Score - - 4 4     Cognitive Function MMSE - Mini Mental State Exam 09/26/2017 01/27/2017  Orientation to time 5 5  Orientation to Place 5 5  Registration 3 3  Attention/ Calculation 5 5  Recall 0 3  Language- name 2 objects 2 -  Language- repeat 1 -  Language- follow 3 step command 3 -  Language- read & follow direction 1 -  Write a sentence 0 -  Copy design 1 -  Total score 26 -     6CIT Screen 12/22/2018 12/22/2018 12/19/2017  What Year? 0 points - 0 points  What month? 0 points - 0 points  What time? 0 points 0 points 3 points   Count back from 20 0 points - 0 points  Months in reverse 0 points - 0 points  Repeat phrase 0 points - 8 points  Total Score 0 - 11    Immunization History  Administered Date(s) Administered  . H1N1 10/01/2008  . Influenza Split 05/18/2012  . Influenza Whole 05/26/2007, 06/12/2009, 05/26/2011  . Influenza,inj,Quad PF,6+ Mos 06/22/2013, 06/25/2014, 08/06/2015, 09/28/2016, 04/27/2017, 05/02/2018  . Pneumococcal Conjugate-13 03/17/2015  . Pneumococcal Polysaccharide-23 02/11/2004  . Td 02/11/2004  . Tdap 07/14/2013  . Zoster 12/26/2006    Qualifies for Shingles Vaccine? No, zoster was given  Screening Tests Health Maintenance  Topic Date Due  . INFLUENZA VACCINE  04/07/2019  . TETANUS/TDAP  07/15/2023  . DEXA SCAN  Completed  . PNA vac Low Risk Adult  Completed    Cancer Screenings: Lung: Low Dose CT Chest recommended if Age 22-80 years, 30 pack-year currently smoking OR have quit w/in 15years. Patient does not qualify. Breast:  Up to date on Mammogram? Yes   Up to date of Bone Density/Dexa? Yes Colorectal: Yes  Additional Screenings: Hepatitis C Screening: Previously tested, unsure of result.     Plan:    1. Encounter for Medicare annual wellness exam  I have personally reviewed and noted the following in the patient's chart:   . Medical and social history . Use of alcohol, tobacco or illicit drugs  . Current medications and supplements . Functional ability and status . Nutritional status . Physical activity . Advanced directives . List of other physicians . Hospitalizations, surgeries, and ER visits in previous 12 months . Vitals . Screenings to include cognitive, depression, and falls . Referrals and appointments  In addition, I have reviewed and discussed with patient certain preventive protocols, quality metrics, and best practice recommendations. A written personalized care plan for preventive services as well as general preventive health  recommendations were provided to patient.    I provided 20 minutes of non-face-to-face time during this encounter.  Perlie Mayo, NP  12/22/2018

## 2018-12-25 ENCOUNTER — Ambulatory Visit: Payer: Medicare HMO

## 2019-01-02 ENCOUNTER — Other Ambulatory Visit: Payer: Self-pay

## 2019-01-02 ENCOUNTER — Encounter: Payer: Self-pay | Admitting: Family Medicine

## 2019-01-02 ENCOUNTER — Ambulatory Visit (INDEPENDENT_AMBULATORY_CARE_PROVIDER_SITE_OTHER): Payer: Medicare HMO | Admitting: Family Medicine

## 2019-01-02 ENCOUNTER — Ambulatory Visit: Payer: Medicare HMO | Admitting: Family Medicine

## 2019-01-02 VITALS — BP 116/84 | Ht 63.0 in | Wt 160.0 lb

## 2019-01-02 DIAGNOSIS — W57XXXA Bitten or stung by nonvenomous insect and other nonvenomous arthropods, initial encounter: Secondary | ICD-10-CM | POA: Diagnosis not present

## 2019-01-02 DIAGNOSIS — I10 Essential (primary) hypertension: Secondary | ICD-10-CM | POA: Diagnosis not present

## 2019-01-02 DIAGNOSIS — R490 Dysphonia: Secondary | ICD-10-CM | POA: Diagnosis not present

## 2019-01-02 MED ORDER — DOXYCYCLINE HYCLATE 100 MG PO TABS: 100.0000 mg | ORAL_TABLET | Freq: Two times a day (BID) | ORAL | 0 refills | Status: DC

## 2019-01-02 MED ORDER — FLUTICASONE PROPIONATE 50 MCG/ACT NA SUSP: 1.0000 | Freq: Every day | NASAL | 3 refills | Status: DC

## 2019-01-02 NOTE — Assessment & Plan Note (Signed)
REports new symptom flare, no longer on GERD treatment and denies symptoms. ENT to eval, states daughter recently noted, no pain present

## 2019-01-02 NOTE — Progress Notes (Signed)
Virtual Visit via Telephone Note  I connected with [name] on 01/02/19 at  8:40 AM EDT by telephone and verified that I am speaking with the correct person using two identifiers.   I discussed the limitations, risks, security and privacy concerns of performing an evaluation and management service by telephone and the availability of in person appointments. I also discussed with the patient that there may be a patient responsible charge related to this service. The patient expressed understanding and agreed to proceed. I am in the office and patient is In her home   History of Present Illness:1  Removed a tick last week and states that she feels as though she removed the entire tick , this was 4 days ago, no fever , chill or body aches  Concerned about stooping , back is not as straight , will work on back exercise  Notes painless hoarseness over the past several months, denies reflux symptoms currently, has been to ENT in the past about this Review of Systems  Constitutional: Negative for chills and fever.  HENT: Negative for sinus pain.   Respiratory: Negative for cough.   Cardiovascular: Negative for chest pain.  Musculoskeletal: Positive for back pain and joint pain.       S/p surgery with chronic pain.,reports stooping  Skin: Positive for itching and rash.       Tick bite x 4 days       Observations/Objective: BP 116/84   Ht 5\' 3"  (1.6 m)   Wt 160 lb (72.6 kg)   BMI 28.34 kg/m   Good communication with no confusion and intact memory. Alert and oriented x 3 No signs of respiratory distress during speech     Assessment and Plan: Tick bite Advised and  Topical benadryl or hydrocortisone cream for itch , and doxycyclinne prescribed x 1 week  Hoarseness of voice REports new symptom flare, no longer on GERD treatment and denies symptoms. ENT to eval, states daughter recently noted, no pain present  Back pain of lumbar region with sciatica mabnaged by  Pain  management, reports increased loss of posture with stooping, will send back exercises  Essential hypertension Controlled, no change in medication      Follow Up Instructions:    I discussed the assessment and treatment plan with the patient. The patient was provided an opportunity to ask questions and all were answered. The patient agreed with the plan and demonstrated an understanding of the instructions.   The patient was advised to call back or seek an in-person evaluation if the symptoms worsen or if the condition fails to improve as anticipated.  I provided 21 minutes of non-face-to-face time during this encounter.   Tula Nakayama, MD

## 2019-01-02 NOTE — Assessment & Plan Note (Signed)
Controlled, no change in medication  

## 2019-01-02 NOTE — Patient Instructions (Addendum)
F/U as before, call if you need me sooner 1 week antibiotic prescribed for tick bite  You are referred to ENT for evaluation of hoarseness, the office will call you  For the loss of posture and increased stooping, I am sending back exercses for strengthening   Back Exercises If you have pain in your back, do these exercises 2-3 times each day or as told by your doctor. When the pain goes away, do the exercises once each day, but repeat the steps more times for each exercise (do more repetitions). If you do not have pain in your back, do these exercises once each day or as told by your doctor. Exercises Single Knee to Chest Do these steps 3-5 times in a row for each leg: 1. Lie on your back on a firm bed or the floor with your legs stretched out. 2. Bring one knee to your chest. 3. Hold your knee to your chest by grabbing your knee or thigh. 4. Pull on your knee until you feel a gentle stretch in your lower back. 5. Keep doing the stretch for 10-30 seconds. 6. Slowly let go of your leg and straighten it. Pelvic Tilt Do these steps 5-10 times in a row: 1. Lie on your back on a firm bed or the floor with your legs stretched out. 2. Bend your knees so they point up to the ceiling. Your feet should be flat on the floor. 3. Tighten your lower belly (abdomen) muscles to press your lower back against the floor. This will make your tailbone point up to the ceiling instead of pointing down to your feet or the floor. 4. Stay in this position for 5-10 seconds while you gently tighten your muscles and breathe evenly. Cat-Cow Do these steps until your lower back bends more easily: 1. Get on your hands and knees on a firm surface. Keep your hands under your shoulders, and keep your knees under your hips. You may put padding under your knees. 2. Let your head hang down, and make your tailbone point down to the floor so your lower back is round like the back of a cat. 3. Stay in this position for 5  seconds. 4. Slowly lift your head and make your tailbone point up to the ceiling so your back hangs low (sags) like the back of a cow. 5. Stay in this position for 5 seconds.  Press-Ups Do these steps 5-10 times in a row: 1. Lie on your belly (face-down) on the floor. 2. Place your hands near your head, about shoulder-width apart. 3. While you keep your back relaxed and keep your hips on the floor, slowly straighten your arms to raise the top half of your body and lift your shoulders. Do not use your back muscles. To make yourself more comfortable, you may change where you place your hands. 4. Stay in this position for 5 seconds. 5. Slowly return to lying flat on the floor.  Bridges Do these steps 10 times in a row: 1. Lie on your back on a firm surface. 2. Bend your knees so they point up to the ceiling. Your feet should be flat on the floor. 3. Tighten your butt muscles and lift your butt off of the floor until your waist is almost as high as your knees. If you do not feel the muscles working in your butt and the back of your thighs, slide your feet 1-2 inches farther away from your butt. 4. Stay in this position for  3-5 seconds. 5. Slowly lower your butt to the floor, and let your butt muscles relax. If this exercise is too easy, try doing it with your arms crossed over your chest. Belly Crunches Do these steps 5-10 times in a row: 1. Lie on your back on a firm bed or the floor with your legs stretched out. 2. Bend your knees so they point up to the ceiling. Your feet should be flat on the floor. 3. Cross your arms over your chest. 4. Tip your chin a little bit toward your chest but do not bend your neck. 5. Tighten your belly muscles and slowly raise your chest just enough to lift your shoulder blades a tiny bit off of the floor. 6. Slowly lower your chest and your head to the floor. Back Lifts Do these steps 5-10 times in a row: 1. Lie on your belly (face-down) with your arms at  your sides, and rest your forehead on the floor. 2. Tighten the muscles in your legs and your butt. 3. Slowly lift your chest off of the floor while you keep your hips on the floor. Keep the back of your head in line with the curve in your back. Look at the floor while you do this. 4. Stay in this position for 3-5 seconds. 5. Slowly lower your chest and your face to the floor. Contact a doctor if:  Your back pain gets a lot worse when you do an exercise.  Your back pain does not lessen 2 hours after you exercise. If you have any of these problems, stop doing the exercises. Do not do them again unless your doctor says it is okay. Get help right away if:  You have sudden, very bad back pain. If this happens, stop doing the exercises. Do not do them again unless your doctor says it is okay. This information is not intended to replace advice given to you by your health care provider. Make sure you discuss any questions you have with your health care provider. Document Released: 09/25/2010 Document Revised: 05/17/2018 Document Reviewed: 10/17/2014 Elsevier Interactive Patient Education  [employer].

## 2019-01-02 NOTE — Assessment & Plan Note (Signed)
mabnaged by  Pain management, reports increased loss of posture with stooping, will send back exercises

## 2019-01-02 NOTE — Assessment & Plan Note (Signed)
Advised and  Topical benadryl or hydrocortisone cream for itch , and doxycyclinne prescribed x 1 week

## 2019-01-10 ENCOUNTER — Ambulatory Visit (HOSPITAL_COMMUNITY): Payer: Medicare HMO

## 2019-01-17 ENCOUNTER — Encounter (HOSPITAL_COMMUNITY): Payer: Self-pay | Admitting: Emergency Medicine

## 2019-01-17 ENCOUNTER — Other Ambulatory Visit: Payer: Self-pay

## 2019-01-17 ENCOUNTER — Emergency Department (HOSPITAL_COMMUNITY)
Admission: EM | Admit: 2019-01-17 | Discharge: 2019-01-17 | Disposition: A | Discharge status: Home / Self Care | Payer: Medicare HMO | Attending: Emergency Medicine | Admitting: Emergency Medicine

## 2019-01-17 DIAGNOSIS — Z79899 Other long term (current) drug therapy: Secondary | ICD-10-CM | POA: Insufficient documentation

## 2019-01-17 DIAGNOSIS — S20162D Insect bite (nonvenomous) of breast, left breast, subsequent encounter: Secondary | ICD-10-CM | POA: Insufficient documentation

## 2019-01-17 DIAGNOSIS — L298 Other pruritus: Secondary | ICD-10-CM | POA: Diagnosis present

## 2019-01-17 DIAGNOSIS — Z87891 Personal history of nicotine dependence: Secondary | ICD-10-CM | POA: Insufficient documentation

## 2019-01-17 DIAGNOSIS — W57XXXD Bitten or stung by nonvenomous insect and other nonvenomous arthropods, subsequent encounter: Secondary | ICD-10-CM | POA: Diagnosis not present

## 2019-01-17 DIAGNOSIS — I259 Chronic ischemic heart disease, unspecified: Secondary | ICD-10-CM | POA: Insufficient documentation

## 2019-01-17 DIAGNOSIS — Z7982 Long term (current) use of aspirin: Secondary | ICD-10-CM | POA: Insufficient documentation

## 2019-01-17 DIAGNOSIS — I10 Essential (primary) hypertension: Secondary | ICD-10-CM | POA: Diagnosis not present

## 2019-01-17 DIAGNOSIS — L299 Pruritus, unspecified: Secondary | ICD-10-CM | POA: Diagnosis not present

## 2019-01-17 DIAGNOSIS — S20161D Insect bite (nonvenomous) of breast, right breast, subsequent encounter: Secondary | ICD-10-CM | POA: Diagnosis not present

## 2019-01-17 DIAGNOSIS — E039 Hypothyroidism, unspecified: Secondary | ICD-10-CM | POA: Insufficient documentation

## 2019-01-17 NOTE — ED Triage Notes (Signed)
Pt c/o continued redness/itching and swelling from tick bite to left rib area. Pt also reports some nausea. denies fever.

## 2019-01-17 NOTE — Discharge Instructions (Addendum)
As discussed,  since you have completed the doxycycline and there is no sign of infection at the site of this tick bite, you do not require any further treatment today.  It is not unusual to experience itching at the site of a tick bite.  I recommend an anti itch cream as needed - I especially like Gold Bond anti itch cream (or the generic version of this!).  Cold compresses can also help with itching.

## 2019-01-17 NOTE — ED Provider Notes (Signed)
[facility] EMERGENCY DEPARTMENT Provider Note   CSN: [id number] Arrival date & time: 01/17/19  1006    History   Chief Complaint Chief Complaint  Patient presents with  . Tick Removal    HPI [name] is a 85 y.o. female presenting for evaluation of a tick bite which she found on April 28.  She describes having in an embedded tick at her left lateral breast which she was able to remove using a forceps at home.  She called her PCP the same day who prescribed doxycycline 100 mg twice daily for 10 days.  The patient has completed this medication, states she had some nausea while she was on it but was able to tolerate the full 10-day course.  She has concerns about persistent itching around the site of the bite prompting her visit here.  She denies fevers or chills, rash, continued nausea and has no other complaints.  She has had no treatments for this symptom, has found no alleviators.  There has been no drainage from the site, no increasing redness, swelling or pain.     The history is provided by the patient.    Past Medical History:  Diagnosis Date  . Anemia   . Blood clot associated with vein wall inflammation    hx of in right leg  . CAD (coronary artery disease)   . Carpal tunnel syndrome on left    Left Hand pain with numbness, new onset   . Chronic back pain   . Chronic back pain    buldging disc  . Constipation   . Diarrhea   . Difficult intubation    small airway  . DJD (degenerative joint disease)    Severe  . Gastric ulcer    hx of   . GERD (gastroesophageal reflux disease)    takes Omeprazole daily  . Headache(784.0)    occasionally  . Hyperlipidemia    takes Lipitor nightly  . Hypertension    takes Amlodipine and Metoprolol daily  . Nocturia   . Obesity   . Pneumonia    hx of-in 2011  . Shortness of breath    with exertion  . Sleep apnea    sleep study done 10yrs ago  . Thyroid mass    right    Patient Active Problem List   Diagnosis  Date Noted  . Tick bite 01/02/2019  . Fall 07/20/2017  . Phobia 05/17/2017  . Seafood allergy 01/31/2017  . Overweight (BMI 25.0-29.9) 10/12/2016  . CAD in native artery   . Hoarseness of voice 05/22/2015  . Osteoarthritis 04/17/2013  . Coronary vasospasm ([place]) 02/07/2013  . Hypothyroid 08/14/2012  . Prediabetes 10/10/2011  . CARPAL TUNNEL SYNDROME, BILATERAL 02/19/2010  . GERD 09/15/2009  . Coronary atherosclerosis 10/01/2008  . DYSPEPSIA 07/07/2008  . Back pain of lumbar region with sciatica 07/03/2008  . Hyperlipidemia 11/24/2007  . Essential hypertension 11/24/2007    Past Surgical History:  Procedure Laterality Date  . ABDOMINAL HYSTERECTOMY    . APPENDECTOMY  1987  . Arthoscopy left knee  1992  . Arthroscopy left shoulder  1999  . Back  Surgery  lumbar  1989 /1999   x 2  . BACK SURGERY    . CARDIAC CATHETERIZATION  05/2010  . CARDIAC CATHETERIZATION N/A 09/30/2015   Procedure: Left Heart Cath and Coronary Angiography;  Surgeon: Troy Sine, MD;  Location: [facility];  Service: Cardiovascular;  Laterality: N/A;  . Carpal tunnel release right  x 2  . CHOLECYSTECTOMY    . COLONOSCOPY    . ESOPHAGOGASTRODUODENOSCOPY    . LUMBAR FUSION  March 19, 2014  . NM MYOCAR PERF WALL MOTION  11/12/2008   no ischemia  . PARTIAL HYSTERECTOMY  1972  . ROTATOR CUFF REPAIR     right  . SPINE SURGERY    . THYROIDECTOMY  11/17/2011   Procedure: THYROIDECTOMY;  Surgeon: Ascencion Dike, MD;  Location: [facility];  Service: ENT;  Laterality: Right;  WITH FROZEN SECTION  . TONSILLECTOMY       OB History   No obstetric history on file.      Home Medications    Prior to Admission medications   Medication Sig Start Date End Date Taking? Authorizing Provider  amLODipine (NORVASC) 10 MG tablet Take 1 tablet (10 mg total) by mouth daily. 01/31/18   Fayrene Helper, MD  Ascorbic Acid (VITAMIN C) 500 MG tablet Take 500 mg by mouth daily.     [provider]  aspirin EC 81  MG tablet Take 81 mg by mouth daily.    [provider]  atorvastatin (LIPITOR) 40 MG tablet Take 1 tablet (40 mg total) by mouth daily at 6 PM. 02/28/18 12/22/19  Fayrene Helper, MD  calcium carbonate (OS-CAL - DOSED IN MG OF ELEMENTAL CALCIUM) 1250 MG tablet Take 1 tablet by mouth daily.    [provider]  Cyanocobalamin (B-12) 1000 MCG CAPS Take 1 capsule by mouth daily.    [provider]  diclofenac sodium (VOLTAREN) 1 % GEL Apply 1 application topically 4 (four) times daily. Patient taking differently: Apply 2 g topically 2 (two) times daily as needed (for back pain).  02/28/12   Fayrene Helper, MD  doxycycline (VIBRA-TABS) 100 MG tablet Take 1 tablet (100 mg total) by mouth 2 (two) times daily. 01/02/19   Fayrene Helper, MD  EPIPEN 2-PAK 0.3 MG/0.3ML SOAJ injection inject as directed for SEVERE ALLERGIC REACTIONS 01/26/17   Fayrene Helper, MD  ferrous sulfate 325 (65 FE) MG tablet Take 325 mg by mouth daily with breakfast.     [provider]  fluticasone (FLONASE) 50 MCG/ACT nasal spray Place 1 spray into both nostrils daily. 01/02/19   Fayrene Helper, MD  hydrochlorothiazide (MICROZIDE) 12.5 MG capsule TAKE 1 CAPSULE(12.5 MG) BY MOUTH DAILY 07/10/18   Fayrene Helper, MD  HYDROcodone-acetaminophen (NORCO) 7.5-325 MG tablet Take 1 tablet by mouth daily as needed for moderate pain. 11/14/17   Fayrene Helper, MD  isosorbide mononitrate (IMDUR) 30 MG 24 hr tablet Take 1 tablet (30 mg total) by mouth daily. 03/08/18   Croitoru, Mihai, MD  levothyroxine (SYNTHROID, LEVOTHROID) 25 MCG tablet TAKE 1 TABLET BY MOUTH EVERY MORNING 11/06/18   Fayrene Helper, MD  Multiple Vitamin (MULITIVITAMIN WITH MINERALS) TABS Take 1 tablet by mouth daily.    [provider]  nitroGLYCERIN (NITROLINGUAL) 0.4 MG/SPRAY spray Place 1 spray every 5 (five) minutes x 3 doses as needed under the tongue for chest pain. 07/20/17   Croitoru, Mihai, MD   potassium chloride (K-DUR) 10 MEQ tablet TAKE 1 TABLET(10 MEQ) BY MOUTH DAILY 07/10/18   Fayrene Helper, MD    Family History Family History  Problem Relation Age of Onset  . Anuerysm Mother        died at 47  . Stroke Father        68 at death   . Anesthesia problems Neg Hx   .  Hypotension Neg Hx   . Malignant hyperthermia Neg Hx   . Pseudochol deficiency Neg Hx   . Allergic rhinitis Neg Hx   . Angioedema Neg Hx   . Asthma Neg Hx   . Atopy Neg Hx   . Eczema Neg Hx   . Immunodeficiency Neg Hx   . Urticaria Neg Hx     Social History Social History   Tobacco Use  . Smoking status: Former Smoker    Types: Cigarettes    Last attempt to quit: 09/06/1984    Years since quitting: 34.3  . Smokeless tobacco: Never Used  Substance Use Topics  . Alcohol use: No    Comment: Casual  . Drug use: No     Allergies   Ace inhibitors; Bupropion hcl; Codeine; Penicillins; Shellfish allergy; Angiotensin receptor blockers; and Bee venom   Review of Systems Review of Systems  Constitutional: Negative for chills and fever.  HENT: Negative for congestion.   Eyes: Negative.   Respiratory: Negative for shortness of breath.   Cardiovascular: Negative for chest pain.  Gastrointestinal: Negative for abdominal pain, nausea and vomiting.  Genitourinary: Negative.   Musculoskeletal: Negative for arthralgias, joint swelling and neck pain.  Skin: Positive for wound. Negative for color change and rash.  Neurological: Negative for dizziness, weakness, light-headedness, numbness and headaches.  Psychiatric/Behavioral: Negative.      Physical Exam Updated Vital Signs BP 133/86   Pulse 79   Temp 98.2 F (36.8 C) (Oral)   Resp 18   Ht 5\' 4"  (1.626 m)   Wt 70.3 kg   SpO2 100%   BMI 26.61 kg/m   Physical Exam Constitutional:      General: She is not in acute distress.    Appearance: She is well-developed.  HENT:     Head: Normocephalic.  Neck:     Musculoskeletal: Neck supple.   Cardiovascular:     Rate and Rhythm: Normal rate.  Pulmonary:     Effort: Pulmonary effort is normal.     Breath sounds: No wheezing.  Musculoskeletal: Normal range of motion.  Skin:    Findings: No rash.     Comments: Small scabbed lesion left lateral breast. No induration, fluctuance or surrounding erythema. No drainage.  No retained fb appreciated.  Excoriations and subtle bruising at site.      ED Treatments / Results  Labs (all labs ordered are listed, but only abnormal results are displayed) Labs Reviewed - No data to display  EKG None  Radiology No results found.  Procedures Procedures (including critical care time)  Medications Ordered in ED Medications - No data to display   Initial Impression / Assessment and Plan / ED Course  I have reviewed the triage vital signs and the nursing notes.  Pertinent labs & imaging results that were available during my care of the patient were reviewed by me and considered in my medical decision making (see chart for details).        Pt with sequelae of tick bite, no sign of infection, completed course of doxycycline.  Suspect localized histamine reaction from bite. Advised that this should settle down without need of additional abx or prescriptions.  She would benefit from an anti itch cream such as Gold Bond anti itch or benadryl topica.  Also discussed cool compresses. Prn f/u pcp prn.   Final Clinical Impressions(s) / ED Diagnoses   Final diagnoses:  Itching  Tick bite of left female breast, subsequent encounter    ED Discharge  Orders    None       Evalee Jefferson, PA-C 01/17/19 [place], [place], DO 01/22/19 [phone]

## 2019-01-22 ENCOUNTER — Other Ambulatory Visit: Payer: Self-pay

## 2019-01-22 ENCOUNTER — Ambulatory Visit (HOSPITAL_COMMUNITY)
Admission: RE | Admit: 2019-01-22 | Discharge: 2019-01-22 | Disposition: A | Discharge status: Home / Self Care | Payer: Medicare HMO | Source: Ambulatory Visit | Attending: Family Medicine | Admitting: Family Medicine

## 2019-01-22 DIAGNOSIS — Z1231 Encounter for screening mammogram for malignant neoplasm of breast: Secondary | ICD-10-CM

## 2019-02-02 ENCOUNTER — Observation Stay (HOSPITAL_COMMUNITY)
Admission: EM | Admit: 2019-02-02 | Discharge: 2019-02-03 | Disposition: A | Discharge status: Home / Self Care | Payer: Medicare HMO | Attending: Family Medicine | Admitting: Family Medicine

## 2019-02-02 ENCOUNTER — Other Ambulatory Visit: Payer: Self-pay

## 2019-02-02 ENCOUNTER — Encounter (HOSPITAL_COMMUNITY): Payer: Self-pay | Admitting: Emergency Medicine

## 2019-02-02 DIAGNOSIS — X58XXXA Exposure to other specified factors, initial encounter: Secondary | ICD-10-CM | POA: Diagnosis not present

## 2019-02-02 DIAGNOSIS — Z79899 Other long term (current) drug therapy: Secondary | ICD-10-CM | POA: Diagnosis not present

## 2019-02-02 DIAGNOSIS — I2510 Atherosclerotic heart disease of native coronary artery without angina pectoris: Secondary | ICD-10-CM | POA: Diagnosis present

## 2019-02-02 DIAGNOSIS — T783XXA Angioneurotic edema, initial encounter: Secondary | ICD-10-CM | POA: Diagnosis present

## 2019-02-02 DIAGNOSIS — Z7982 Long term (current) use of aspirin: Secondary | ICD-10-CM | POA: Insufficient documentation

## 2019-02-02 DIAGNOSIS — Z87891 Personal history of nicotine dependence: Secondary | ICD-10-CM | POA: Insufficient documentation

## 2019-02-02 DIAGNOSIS — Z885 Allergy status to narcotic agent status: Secondary | ICD-10-CM | POA: Insufficient documentation

## 2019-02-02 DIAGNOSIS — K219 Gastro-esophageal reflux disease without esophagitis: Secondary | ICD-10-CM | POA: Diagnosis not present

## 2019-02-02 DIAGNOSIS — Z880 Allergy status to penicillin: Secondary | ICD-10-CM | POA: Insufficient documentation

## 2019-02-02 DIAGNOSIS — T782XXA Anaphylactic shock, unspecified, initial encounter: Secondary | ICD-10-CM | POA: Diagnosis not present

## 2019-02-02 DIAGNOSIS — Z79890 Hormone replacement therapy (postmenopausal): Secondary | ICD-10-CM | POA: Insufficient documentation

## 2019-02-02 DIAGNOSIS — I10 Essential (primary) hypertension: Secondary | ICD-10-CM | POA: Diagnosis not present

## 2019-02-02 DIAGNOSIS — E785 Hyperlipidemia, unspecified: Secondary | ICD-10-CM | POA: Diagnosis present

## 2019-02-02 DIAGNOSIS — E7800 Pure hypercholesterolemia, unspecified: Secondary | ICD-10-CM | POA: Diagnosis not present

## 2019-02-02 DIAGNOSIS — Z03818 Encounter for observation for suspected exposure to other biological agents ruled out: Secondary | ICD-10-CM | POA: Diagnosis not present

## 2019-02-02 DIAGNOSIS — E039 Hypothyroidism, unspecified: Secondary | ICD-10-CM | POA: Diagnosis present

## 2019-02-02 DIAGNOSIS — Z791 Long term (current) use of non-steroidal anti-inflammatories (NSAID): Secondary | ICD-10-CM | POA: Insufficient documentation

## 2019-02-02 DIAGNOSIS — E876 Hypokalemia: Secondary | ICD-10-CM

## 2019-02-02 DIAGNOSIS — Z888 Allergy status to other drugs, medicaments and biological substances status: Secondary | ICD-10-CM | POA: Insufficient documentation

## 2019-02-02 DIAGNOSIS — Z1159 Encounter for screening for other viral diseases: Secondary | ICD-10-CM | POA: Insufficient documentation

## 2019-02-02 LAB — COMPREHENSIVE METABOLIC PANEL
ALT: 15 U/L (ref 0–44)
AST: 24 U/L (ref 15–41)
Albumin: 3.8 g/dL (ref 3.5–5.0)
Alkaline Phosphatase: 90 U/L (ref 38–126)
Anion gap: 7 (ref 5–15)
BUN: 22 mg/dL (ref 8–23)
CO2: 28 mmol/L (ref 22–32)
Calcium: 9 mg/dL (ref 8.9–10.3)
Chloride: 104 mmol/L (ref 98–111)
Creatinine, Ser: 0.87 mg/dL (ref 0.44–1.00)
GFR calc Af Amer: >60 mL/min (ref >60)
GFR calc non Af Amer: >60 mL/min (ref >60)
Glucose, Bld: 103 mg/dL — ABNORMAL HIGH (ref 70–99)
Potassium: 2.6 mmol/L — CL (ref 3.5–5.1)
Sodium: 139 mmol/L (ref 135–145)
Total Bilirubin: 0.5 mg/dL (ref 0.3–1.2)
Total Protein: 6.9 g/dL (ref 6.5–8.1)

## 2019-02-02 LAB — CBC
HCT: 36.7 % (ref 36.0–46.0)
Hemoglobin: 11.6 g/dL — ABNORMAL LOW (ref 12.0–15.0)
MCH: 28.6 pg (ref 26.0–34.0)
MCHC: 31.6 g/dL (ref 30.0–36.0)
MCV: 90.4 fL (ref 80.0–100.0)
Platelets: 229 10*3/uL (ref 150–400)
RBC: 4.06 MIL/uL (ref 3.87–5.11)
RDW: 13.7 % (ref 11.5–15.5)
WBC: 6.3 10*3/uL (ref 4.0–10.5)
nRBC: 0 % (ref 0.0–0.2)

## 2019-02-02 LAB — SARS CORONAVIRUS 2 BY RT PCR (HOSPITAL ORDER, PERFORMED IN ~~LOC~~ HOSPITAL LAB): SARS Coronavirus 2: NEGATIVE

## 2019-02-02 MED ORDER — AMLODIPINE BESYLATE 5 MG PO TABS
10.0000 mg | ORAL_TABLET | Freq: Every day | ORAL | Status: DC
  Administered 2019-02-03: 10 mg via ORAL
  Filled 2019-02-02: qty 2

## 2019-02-02 MED ORDER — POTASSIUM CHLORIDE 10 MEQ/100ML IV SOLN
10.0000 meq | INTRAVENOUS | Status: AC
  Administered 2019-02-02 – 2019-02-03 (×4): 10 meq via INTRAVENOUS
  Filled 2019-02-02 (×4): qty 100

## 2019-02-02 MED ORDER — METHYLPREDNISOLONE SODIUM SUCC 125 MG IJ SOLR
80.0000 mg | Freq: Three times a day (TID) | INTRAMUSCULAR | Status: DC
  Administered 2019-02-03: 80 mg via INTRAVENOUS
  Filled 2019-02-02: qty 2

## 2019-02-02 MED ORDER — EPINEPHRINE 0.3 MG/0.3ML IJ SOAJ
0.3000 mg | Freq: Once | INTRAMUSCULAR | Status: AC
  Administered 2019-02-02: 0.3 mg via INTRAMUSCULAR
  Filled 2019-02-02: qty 0.3

## 2019-02-02 MED ORDER — FAMOTIDINE IN NACL 20-0.9 MG/50ML-% IV SOLN
20.0000 mg | Freq: Two times a day (BID) | INTRAVENOUS | Status: DC
  Administered 2019-02-03: 20 mg via INTRAVENOUS
  Filled 2019-02-02: qty 50

## 2019-02-02 MED ORDER — HEPARIN SODIUM (PORCINE) 5000 UNIT/ML IJ SOLN
5000.0000 [IU] | Freq: Three times a day (TID) | INTRAMUSCULAR | Status: DC
  Administered 2019-02-02 – 2019-02-03 (×2): 5000 [IU] via SUBCUTANEOUS
  Filled 2019-02-02 (×2): qty 1

## 2019-02-02 MED ORDER — ATORVASTATIN CALCIUM 40 MG PO TABS: 40.0000 mg | ORAL_TABLET | Freq: Every day | ORAL | Status: DC

## 2019-02-02 MED ORDER — FAMOTIDINE IN NACL 20-0.9 MG/50ML-% IV SOLN
20.0000 mg | Freq: Once | INTRAVENOUS | Status: AC
  Administered 2019-02-02: 20 mg via INTRAVENOUS
  Filled 2019-02-02: qty 50

## 2019-02-02 MED ORDER — POTASSIUM CHLORIDE IN NACL 40-0.9 MEQ/L-% IV SOLN
INTRAVENOUS | Status: DC
  Administered 2019-02-02: 50 mL/h via INTRAVENOUS

## 2019-02-02 MED ORDER — DIPHENHYDRAMINE HCL 50 MG/ML IJ SOLN
INTRAMUSCULAR | Status: AC
  Administered 2019-02-02: 50 mg
  Filled 2019-02-02: qty 1

## 2019-02-02 MED ORDER — CHLORHEXIDINE GLUCONATE CLOTH 2 % EX PADS
6.0000 | MEDICATED_PAD | Freq: Every day | CUTANEOUS | Status: DC
  Administered 2019-02-03: 6 via TOPICAL

## 2019-02-02 MED ORDER — METHYLPREDNISOLONE SODIUM SUCC 125 MG IJ SOLR
INTRAMUSCULAR | Status: AC
  Administered 2019-02-02: 125 mg
  Filled 2019-02-02: qty 2

## 2019-02-02 MED ORDER — SODIUM CHLORIDE 0.9 % IV BOLUS
1000.0000 mL | Freq: Once | INTRAVENOUS | Status: AC
  Administered 2019-02-02: 1000 mL via INTRAVENOUS

## 2019-02-02 MED ORDER — DIPHENHYDRAMINE HCL 50 MG/ML IJ SOLN
25.0000 mg | Freq: Four times a day (QID) | INTRAMUSCULAR | Status: DC
  Administered 2019-02-03 (×2): 25 mg via INTRAVENOUS
  Filled 2019-02-02 (×2): qty 1

## 2019-02-02 MED ORDER — POTASSIUM CHLORIDE 10 MEQ/100ML IV SOLN: 10.0000 meq | INTRAVENOUS | Status: DC

## 2019-02-02 MED ORDER — MAGNESIUM SULFATE 2 GM/50ML IV SOLN
2.0000 g | Freq: Once | INTRAVENOUS | Status: AC
  Administered 2019-02-02: 2 g via INTRAVENOUS
  Filled 2019-02-02: qty 50

## 2019-02-02 MED ORDER — LEVOTHYROXINE SODIUM 25 MCG PO TABS
25.0000 ug | ORAL_TABLET | Freq: Every morning | ORAL | Status: DC
  Administered 2019-02-03: 25 ug via ORAL
  Filled 2019-02-02: qty 1

## 2019-02-02 NOTE — ED Triage Notes (Signed)
Patient states she is having an allergic reaction with trouble swallowing. Patient is unsure what the reaction is from. Patient states shortness of breath.

## 2019-02-02 NOTE — ED Notes (Signed)
Pt ambulated to restroom with personal cane without assistance.

## 2019-02-02 NOTE — H&P (Signed)
TRH H&P   Patient Demographics:    [name], is a 85 y.o. female  MRN: [id number]   DOB - 03-24-1934  Admit Date - 02/02/2019  Outpatient Primary MD for the patient is Fayrene Helper, MD  Referring MD/NP/PA: Dr Sedonia Small  Patient coming from: Home  Chief Complaint  Patient presents with  . Allergic Reaction      HPI:    [name]  is a 85 y.o. female, with past medical history of anemia, CAD, chronic back pain, disease, angioedema, GERD, hyperlipidemia, patient presents due to complaints of allergic reaction, reports she started to experiencing trouble swallowing, and possible sensation of facial swelling, some tongue tingling and numbness, some changes in her voice, reports this is similar to her prior allergic reactions, which prompted her to come to ED, she denies fever, chills, chest pain, shortness of breath, focal deficits, tingling or numbness in her arms or legs, no abdominal pain. -In ED patient was noted to have swollen tongue, started to have some changes in her speech and low oxygen saturation, she did receive epinephrine, steroids and antihistamines with improvement of her symptoms, her labs were significant for hypokalemia at 2.6, I was consulted to admit for further management.   Review of systems:    In addition to the HPI above,  No Fever-chills, No Headache, No changes with Vision or hearing, Reports some problems swallowing food, tongue tingling and numbness No Chest pain, Cough, no shortness of breath no Abdominal pain, No Nausea or Vommitting, Bowel movements are regular, No Blood in stool or Urine, No dysuria, No new skin rashes or bruises, No new joints pains-aches,  No new weakness, tingling, numbness in any extremity, No recent weight gain or loss, No polyuria, polydypsia or polyphagia, No significant Mental Stressors.  A full 10  point Review of Systems was done, except as stated above, all other Review of Systems were negative.   With Past History of the following :    Past Medical History:  Diagnosis Date  . Anemia   . Blood clot associated with vein wall inflammation    hx of in right leg  . CAD (coronary artery disease)   . Carpal tunnel syndrome on left    Left Hand pain with numbness, new onset   . Chronic back pain   . Chronic back pain    buldging disc  . Constipation   . Diarrhea   . Difficult intubation    small airway  . DJD (degenerative joint disease)    Severe  . Gastric ulcer    hx of   . GERD (gastroesophageal reflux disease)    takes Omeprazole daily  . Headache(784.0)    occasionally  . Hyperlipidemia    takes Lipitor nightly  . Hypertension    takes Amlodipine and Metoprolol daily  . Nocturia   . Obesity   . Pneumonia  hx of-in 2011  . Shortness of breath    with exertion  . Sleep apnea    sleep study done 44yrs ago  . Thyroid mass    right      Past Surgical History:  Procedure Laterality Date  . ABDOMINAL HYSTERECTOMY    . APPENDECTOMY  1987  . Arthoscopy left knee  1992  . Arthroscopy left shoulder  1999  . Back  Surgery  lumbar  1989 /1999   x 2  . BACK SURGERY    . CARDIAC CATHETERIZATION  05/2010  . CARDIAC CATHETERIZATION N/A 09/30/2015   Procedure: Left Heart Cath and Coronary Angiography;  Surgeon: Troy Sine, MD;  Location: [place] [facility];  Service: Cardiovascular;  Laterality: N/A;  . Carpal tunnel release right     x 2  . CHOLECYSTECTOMY    . COLONOSCOPY    . ESOPHAGOGASTRODUODENOSCOPY    . LUMBAR FUSION  March 19, 2014  . NM MYOCAR PERF WALL MOTION  11/12/2008   no ischemia  . PARTIAL HYSTERECTOMY  1972  . ROTATOR CUFF REPAIR     right  . SPINE SURGERY    . THYROIDECTOMY  11/17/2011   Procedure: THYROIDECTOMY;  Surgeon: Ascencion Dike, MD;  Location: [facility];  Service: ENT;  Laterality: Right;  WITH FROZEN SECTION  . TONSILLECTOMY         Social History:     Social History   Tobacco Use  . Smoking status: Former Smoker    Types: Cigarettes    Last attempt to quit: 09/06/1984    Years since quitting: 34.4  . Smokeless tobacco: Never Used  Substance Use Topics  . Alcohol use: No    Comment: Casual     Lives -home by herself  Mobility -dependent     Family History :     Family History  Problem Relation Age of Onset  . Anuerysm Mother        died at 58  . Stroke Father        64 at death   . Anesthesia problems Neg Hx   . Hypotension Neg Hx   . Malignant hyperthermia Neg Hx   . Pseudochol deficiency Neg Hx   . Allergic rhinitis Neg Hx   . Angioedema Neg Hx   . Asthma Neg Hx   . Atopy Neg Hx   . Eczema Neg Hx   . Immunodeficiency Neg Hx   . Urticaria Neg Hx      Home Medications:   Prior to Admission medications   Medication Sig Start Date End Date Taking? Authorizing Provider  amLODipine (NORVASC) 10 MG tablet Take 1 tablet (10 mg total) by mouth daily. 01/31/18   Fayrene Helper, MD  Ascorbic Acid (VITAMIN C) 500 MG tablet Take 500 mg by mouth daily.     [provider]  aspirin EC 81 MG tablet Take 81 mg by mouth daily.    [provider]  atorvastatin (LIPITOR) 40 MG tablet Take 1 tablet (40 mg total) by mouth daily at 6 PM. 02/28/18 12/22/19  Fayrene Helper, MD  calcium carbonate (OS-CAL - DOSED IN MG OF ELEMENTAL CALCIUM) 1250 MG tablet Take 1 tablet by mouth daily.    [provider]  Cyanocobalamin (B-12) 1000 MCG CAPS Take 1 capsule by mouth daily.    [provider]  diclofenac sodium (VOLTAREN) 1 % GEL Apply 1 application topically 4 (four) times daily. Patient taking differently: Apply 2 g  topically 2 (two) times daily as needed (for back pain).  02/28/12   Fayrene Helper, MD  doxycycline (VIBRA-TABS) 100 MG tablet Take 1 tablet (100 mg total) by mouth 2 (two) times daily. 01/02/19   Fayrene Helper, MD  EPIPEN 2-PAK 0.3 MG/0.3ML SOAJ  injection inject as directed for SEVERE ALLERGIC REACTIONS 01/26/17   Fayrene Helper, MD  ferrous sulfate 325 (65 FE) MG tablet Take 325 mg by mouth daily with breakfast.     [provider]  fluticasone (FLONASE) 50 MCG/ACT nasal spray Place 1 spray into both nostrils daily. 01/02/19   Fayrene Helper, MD  hydrochlorothiazide (MICROZIDE) 12.5 MG capsule TAKE 1 CAPSULE(12.5 MG) BY MOUTH DAILY 07/10/18   Fayrene Helper, MD  HYDROcodone-acetaminophen (NORCO) 7.5-325 MG tablet Take 1 tablet by mouth daily as needed for moderate pain. 11/14/17   Fayrene Helper, MD  isosorbide mononitrate (IMDUR) 30 MG 24 hr tablet Take 1 tablet (30 mg total) by mouth daily. 03/08/18   Croitoru, Mihai, MD  levothyroxine (SYNTHROID, LEVOTHROID) 25 MCG tablet TAKE 1 TABLET BY MOUTH EVERY MORNING 11/06/18   Fayrene Helper, MD  Multiple Vitamin (MULITIVITAMIN WITH MINERALS) TABS Take 1 tablet by mouth daily.    [provider]  nitroGLYCERIN (NITROLINGUAL) 0.4 MG/SPRAY spray Place 1 spray every 5 (five) minutes x 3 doses as needed under the tongue for chest pain. 07/20/17   Croitoru, Mihai, MD  potassium chloride (K-DUR) 10 MEQ tablet TAKE 1 TABLET(10 MEQ) BY MOUTH DAILY 07/10/18   Fayrene Helper, MD     Allergies:     Allergies  Allergen Reactions  . Ace Inhibitors Anaphylaxis    Was on ventilator for six days due to airway swelling shut while on ACE inhibitors.  . Bupropion Hcl Anaphylaxis  . Codeine Anaphylaxis  . Penicillins Anaphylaxis    Has patient had a PCN reaction causing immediate rash, facial/tongue/throat swelling, SOB or lightheadedness with hypotension: Yes Has patient had a PCN reaction causing severe rash involving mucus membranes or skin necrosis: No Has patient had a PCN reaction that required hospitalization No Has patient had a PCN reaction occurring within the last 10 years: No If all of the above answers are "NO", then may proceed with Cephalosporin use.    . Shellfish Allergy Anaphylaxis    Tongue swells up, Oysters OK  . Angiotensin Receptor Blockers Other (See Comments)    Patient unable to recall what medication this might have been and/or her reaction to it.  Raelyn Ensign Venom     Facial swelling       Physical Exam:   Vitals  Blood pressure (!) 169/91, pulse (!) 105, temperature 98.8 F (37.1 C), temperature source Oral, resp. rate 15, height 5' 2.5" (1.588 m), weight 69.9 kg, SpO2 100 %.   1. General developed female, laying in bed in no apparent distress  2. Normal affect and insight, Not Suicidal or Homicidal, Awake Alert, Oriented X 3.  3. No F.N deficits, ALL C.Nerves Intact, Strength 5/5 all 4 extremities, Sensation intact all 4 extremities, Plantars down going.  4. Ears and Eyes appear Normal, Conjunctivae clear, PERRLA. Moist Oral Mucosa.  Patient with mildly enlarged  tongue, could not see the back of her throat despite tongue depressors, but no stridors in the neck area  5. Supple Neck, No JVD, No cervical lymphadenopathy appriciated, No Carotid Bruits.  6. Symmetrical Chest wall movement, Good air movement bilaterally, CTAB.  7. RRR, No Gallops, Rubs or  Murmurs, No Parasternal Heave.  8. Positive Bowel Sounds, Abdomen Soft, No tenderness, No organomegaly appriciated,No rebound -guarding or rigidity.  9.  No Cyanosis, Normal Skin Turgor, No Skin Rash or Bruise.  10. Good muscle tone,  joints appear normal , no effusions, Normal ROM.  11. No Palpable Lymph Nodes in Neck or Axillae     Data Review:    CBC Recent Labs  Lab 02/02/19 2000  WBC 6.3  HGB 11.6*  HCT 36.7  PLT 229  MCV 90.4  MCH 28.6  MCHC 31.6  RDW 13.7   ------------------------------------------------------------------------------------------------------------------  Chemistries  Recent Labs  Lab 02/02/19 2000  NA 139  K 2.6*  CL 104  CO2 28  GLUCOSE 103*  BUN 22  CREATININE 0.87  CALCIUM 9.0  AST 24  ALT 15  ALKPHOS 90    BILITOT 0.5   ------------------------------------------------------------------------------------------------------------------ estimated creatinine clearance is 46.2 mL/min (by C-G formula based on SCr of 0.87 mg/dL). ------------------------------------------------------------------------------------------------------------------ No results for input(s): TSH, T4TOTAL, T3FREE, THYROIDAB in the last 72 hours.  Invalid input(s): FREET3  Coagulation profile No results for input(s): INR, PROTIME in the last 168 hours. ------------------------------------------------------------------------------------------------------------------- No results for input(s): DDIMER in the last 72 hours. -------------------------------------------------------------------------------------------------------------------  Cardiac Enzymes No results for input(s): CKMB, TROPONINI, MYOGLOBIN in the last 168 hours.  Invalid input(s): CK ------------------------------------------------------------------------------------------------------------------ No results found for: BNP   ---------------------------------------------------------------------------------------------------------------  Urinalysis    Component Value Date/Time   COLORURINE YELLOW 08/11/2018 1134   APPEARANCEUR CLOUDY (A) 08/11/2018 1134   LABSPEC 1.020 08/11/2018 1134   PHURINE > OR = 8.5 (A) 08/11/2018 1134   GLUCOSEU NEGATIVE 08/11/2018 1134   GLUCOSEU NEG mg/dL 07/04/2008 0105   HGBUR 1+ (A) 08/11/2018 1134   HGBUR trace-intact 07/03/2008 1303   BILIRUBINUR NEGATIVE 11/24/2017 1426   BILIRUBINUR neg 11/14/2017 1533   KETONESUR NEGATIVE 08/11/2018 1134   PROTEINUR 2+ (A) 08/11/2018 1134   UROBILINOGEN 0.2 11/14/2017 1533   UROBILINOGEN 0.2 06/07/2010 1020   NITRITE NEGATIVE 11/24/2017 1426   LEUKOCYTESUR TRACE (A) 11/24/2017 1426     ----------------------------------------------------------------------------------------------------------------   Imaging Results:    No results found.    Assessment & Plan:    Active Problems:   Hyperlipidemia   Essential hypertension   GERD   Hypothyroid   CAD in native artery   Angioedema   Angioedema -Presentation patient had tongue swelling, requiring Epinephrine injection, antihistamine and steroids per ED physician, had improvement of her tongue swelling, as well patient reports she significantly improved. -Patient does not recall any new medications, new food, or any product started recently. -He will be admitted to stepdown, will be kept n.p.o. overnight for airway protection, will start on IV Solu-Medrol 80 mg every 8 hours, IV famotidine, and IV Benadryl.  Hypertension -Pressure mildly elevated, start on amlodipine, hold hydrochlorothiazide and Imdur, resume tomorrow once stable  Hypothyroidism -Continue with Synthroid  GERD -Continue with home meds  History of CAD -She denies any chest pain, resume aspirin in a.m.  Hyperlipidemia -Continue with home meds  DVT Prophylaxis Heparin - SCD  AM Labs Ordered, also please review Full Orders  Family Communication: Admission, patients condition and plan of care including tests being ordered have been discussed with the patient  who indicate understanding and agree with the plan and Code Status.  Code Status Full  Likely DC to  Home  Condition GUARDED    Consults called: None  Admission status: Observation   Time spent in minutes : 50 minutes   [facility]  M.D on 02/02/2019 at 9:18 PM  Between 7am to 7pm - Pager - [phone]. After 7pm go to [email or web address] - password [facility]  [facility] - Office  [phone]

## 2019-02-02 NOTE — ED Provider Notes (Signed)
[facility] Emergency Department Provider Note MRN:  [id number]  Arrival date & time: 02/02/19     Chief Complaint   Allergic Reaction   History of Present Illness   [name] is a 85 y.o. year-old female with a history of CAD presenting to the ED with chief complaint of allergic reaction.  Patient explains that she is experiencing trouble swallowing and possible sensation of facial swelling, similar to prior allergic reactions.  Patient does not think she was exposed to any of her known allergies.  She denies headache or vision change, denies chest pain or shortness of breath, no numbness or weakness to the arms or legs, no abdominal pain.  She is endorsing change to voice.  Review of Systems  A complete 10 system review of systems was obtained and all systems are negative except as noted in the HPI and PMH.   Patient's Health History    Past Medical History:  Diagnosis Date  . Anemia   . Blood clot associated with vein wall inflammation    hx of in right leg  . CAD (coronary artery disease)   . Carpal tunnel syndrome on left    Left Hand pain with numbness, new onset   . Chronic back pain   . Chronic back pain    buldging disc  . Constipation   . Diarrhea   . Difficult intubation    small airway  . DJD (degenerative joint disease)    Severe  . Gastric ulcer    hx of   . GERD (gastroesophageal reflux disease)    takes Omeprazole daily  . Headache(784.0)    occasionally  . Hyperlipidemia    takes Lipitor nightly  . Hypertension    takes Amlodipine and Metoprolol daily  . Nocturia   . Obesity   . Pneumonia    hx of-in 2011  . Shortness of breath    with exertion  . Sleep apnea    sleep study done 71yrs ago  . Thyroid mass    right    Past Surgical History:  Procedure Laterality Date  . ABDOMINAL HYSTERECTOMY    . APPENDECTOMY  1987  . Arthoscopy left knee  1992  . Arthroscopy left shoulder  1999  . Back  Surgery  lumbar  1989  /1999   x 2  . BACK SURGERY    . CARDIAC CATHETERIZATION  05/2010  . CARDIAC CATHETERIZATION N/A 09/30/2015   Procedure: Left Heart Cath and Coronary Angiography;  Surgeon: Troy Sine, MD;  Location: [place] [facility];  Service: Cardiovascular;  Laterality: N/A;  . Carpal tunnel release right     x 2  . CHOLECYSTECTOMY    . COLONOSCOPY    . ESOPHAGOGASTRODUODENOSCOPY    . LUMBAR FUSION  March 19, 2014  . NM MYOCAR PERF WALL MOTION  11/12/2008   no ischemia  . PARTIAL HYSTERECTOMY  1972  . ROTATOR CUFF REPAIR     right  . SPINE SURGERY    . THYROIDECTOMY  11/17/2011   Procedure: THYROIDECTOMY;  Surgeon: Ascencion Dike, MD;  Location: [place];  Service: ENT;  Laterality: Right;  WITH FROZEN SECTION  . TONSILLECTOMY      Family History  Problem Relation Age of Onset  . Anuerysm Mother        died at 24  . Stroke Father        39 at death   . Anesthesia problems Neg Hx   . Hypotension  Neg Hx   . Malignant hyperthermia Neg Hx   . Pseudochol deficiency Neg Hx   . Allergic rhinitis Neg Hx   . Angioedema Neg Hx   . Asthma Neg Hx   . Atopy Neg Hx   . Eczema Neg Hx   . Immunodeficiency Neg Hx   . Urticaria Neg Hx     Social History   Socioeconomic History  . Marital status: Widowed    Spouse name: Not on file  . Number of children: 4  . Years of education: Not on file  . Highest education level: Not on file  Occupational History    Employer: RETIRED  Social Needs  . Financial resource strain: Not hard at all  . Food insecurity:    Worry: Never true    Inability: Never true  . Transportation needs:    Medical: No    Non-medical: No  Tobacco Use  . Smoking status: Former Smoker    Types: Cigarettes    Last attempt to quit: 09/06/1984    Years since quitting: 34.4  . Smokeless tobacco: Never Used  Substance and Sexual Activity  . Alcohol use: No    Comment: Casual  . Drug use: No  . Sexual activity: Never    Birth control/protection: Surgical  Lifestyle  . Physical  activity:    Days per week: 0 days    Minutes per session: 0 min  . Stress: Not at all  Relationships  . Social connections:    Talks on phone: More than three times a week    Gets together: Twice a week    Attends religious service: More than 4 times per year    Active member of club or organization: No    Attends meetings of clubs or organizations: Never    Relationship status: Divorced  . Intimate partner violence:    Fear of current or ex partner: No    Emotionally abused: No    Physically abused: No    Forced sexual activity: No  Other Topics Concern  . Not on file  Social History Narrative  . Not on file     Physical Exam  Vital Signs and Nursing Notes reviewed Vitals:   02/02/19 2316 02/02/19 2330  BP:  (!) 147/80  Pulse:  93  Resp:  16  Temp: 97.7 F (36.5 C)   SpO2:  95%    CONSTITUTIONAL: Well-appearing, NAD NEURO:  Alert and oriented x 3, no focal deficits EYES:  eyes equal and reactive ENT/NECK:  no LAD, no JVD; enlarged tongue impeding normal speech CARDIO: Regular rate, well-perfused, normal S1 and S2 PULM:  CTAB no wheezing or rhonchi GI/GU:  normal bowel sounds, non-distended, non-tender MSK/SPINE:  No gross deformities, no edema SKIN:  no rash, atraumatic PSYCH:  Appropriate speech and behavior  Diagnostic and Interventional Summary    Labs Reviewed  CBC - Abnormal; Notable for the following components:      Result Value   Hemoglobin 11.6 (*)    All other components within normal limits  COMPREHENSIVE METABOLIC PANEL - Abnormal; Notable for the following components:   Potassium 2.6 (*)    Glucose, Bld 103 (*)    All other components within normal limits  SARS CORONAVIRUS 2 (HOSPITAL ORDER, [facility])  MRSA PCR SCREENING  BASIC METABOLIC PANEL  CBC    No orders to display    Medications  potassium chloride 10 mEq in 100 mL IVPB (10 mEq Intravenous New  Bag/Given 02/02/19 2304)  levothyroxine (SYNTHROID) tablet  25 mcg (has no administration in time range)  atorvastatin (LIPITOR) tablet 40 mg (has no administration in time range)  amLODipine (NORVASC) tablet 10 mg (has no administration in time range)  heparin injection 5,000 Units (5,000 Units Subcutaneous Given 02/02/19 2334)  0.9 % NaCl with KCl 40 mEq / L  infusion (50 mL/hr Intravenous New Bag/Given 02/02/19 2306)  diphenhydrAMINE (BENADRYL) injection 25 mg (has no administration in time range)  famotidine (PEPCID) IVPB 20 mg premix (has no administration in time range)  methylPREDNISolone sodium succinate (SOLU-MEDROL) 125 mg/2 mL injection 80 mg (has no administration in time range)  Chlorhexidine Gluconate Cloth 2 % PADS 6 each (has no administration in time range)  diphenhydrAMINE (BENADRYL) 50 MG/ML injection (50 mg  Given 02/02/19 2004)  methylPREDNISolone sodium succinate (SOLU-MEDROL) 125 mg/2 mL injection (125 mg  Given 02/02/19 2005)  famotidine (PEPCID) IVPB 20 mg premix (0 mg Intravenous Stopped 02/02/19 2133)  sodium chloride 0.9 % bolus 1,000 mL (0 mLs Intravenous Stopping Infusion hung by another clincian 02/02/19 2246)  EPINEPHrine (EPI-PEN) injection 0.3 mg (0.3 mg Intramuscular Given 02/02/19 2018)  magnesium sulfate IVPB 2 g 50 mL (0 g Intravenous Stopped 02/02/19 2235)     Procedures Critical Care Critical Care Documentation Critical care time provided by me (excluding procedures): 32 minutes  Condition necessitating critical care: Concern for anaphylaxis or angioedema  Components of critical care management: reviewing of prior records, laboratory and imaging interpretation, frequent re-examination and reassessment of vital signs, administration of IV Benadryl, famotidine, Solu-Medrol, intramuscular epinephrine, close airway monitoring    ED Course and Medical Decision Making  I have reviewed the triage vital signs and the nursing notes.  Pertinent labs & imaging results that were available during my care of the patient were  reviewed by me and considered in my medical decision making (see below for details).    Clinical Course as of Feb 02 2336  Fri Feb 02, 2019  2018 Patient has a history of multiple anaphylactic allergic reactions, she is unsure of her trigger this evening, she is complaining of inability to swallow, no other neurological deficits, she has a swollen tongue, despite Benadryl and Solu-Medrol she feels it is worsening.  Risks and benefits considered given age and cardiovascular risk factors, intramuscular epinephrine still indicated.  Given that a 815 p.m.   [MB]    Clinical Course User Index [MB] Maudie Flakes, MD    Patient's exam seems to be improving, no resting, no longer complaining of issues with swallowing, tongue seems to be a bit improved with regard to size.  Patient's potassium is 2.6, will replete and treat venously and admit to hospitalist service.  Barth Kirks. Sedonia Small, [place] [email or web address]  Final Clinical Impressions(s) / ED Diagnoses     ICD-10-CM   1. Angioedema, initial encounter T78.3XXA   2. Anaphylaxis, initial encounter T78.2XXA   3. Hypokalemia E87.6     ED Discharge Orders    None         Maudie Flakes, MD 02/02/19 2337

## 2019-02-02 NOTE — ED Notes (Signed)
Date and time results received: 02/02/19 2041 (use smartphrase ".now" to insert current time)  Test: potassium Critical Value: 2.6  Name of Provider Notified: Dr. Sedonia Small at 2041  Orders Received? Or Actions Taken?: no/na

## 2019-02-02 NOTE — ED Notes (Signed)
Pt in from home after feeling that she could not swallow well  She denies knowing allergy exposure to Dr Stark Jock  IV by Verline Lema, meds by this RN Benadryl and solumedrol while Dr Stark Jock at bedside speaking w pt

## 2019-02-03 ENCOUNTER — Other Ambulatory Visit: Payer: Self-pay | Admitting: Family Medicine

## 2019-02-03 DIAGNOSIS — K219 Gastro-esophageal reflux disease without esophagitis: Secondary | ICD-10-CM | POA: Diagnosis not present

## 2019-02-03 DIAGNOSIS — T783XXD Angioneurotic edema, subsequent encounter: Secondary | ICD-10-CM

## 2019-02-03 DIAGNOSIS — I10 Essential (primary) hypertension: Secondary | ICD-10-CM | POA: Diagnosis not present

## 2019-02-03 DIAGNOSIS — E039 Hypothyroidism, unspecified: Secondary | ICD-10-CM | POA: Diagnosis not present

## 2019-02-03 DIAGNOSIS — E7800 Pure hypercholesterolemia, unspecified: Secondary | ICD-10-CM | POA: Diagnosis not present

## 2019-02-03 LAB — BASIC METABOLIC PANEL
Anion gap: 8 (ref 5–15)
BUN: 17 mg/dL (ref 8–23)
CO2: 26 mmol/L (ref 22–32)
Calcium: 9.3 mg/dL (ref 8.9–10.3)
Chloride: 105 mmol/L (ref 98–111)
Creatinine, Ser: 0.85 mg/dL (ref 0.44–1.00)
GFR calc Af Amer: >60 mL/min (ref >60)
GFR calc non Af Amer: >60 mL/min (ref >60)
Glucose, Bld: 167 mg/dL — ABNORMAL HIGH (ref 70–99)
Potassium: 3.1 mmol/L — ABNORMAL LOW (ref 3.5–5.1)
Sodium: 139 mmol/L (ref 135–145)

## 2019-02-03 LAB — CBC
HCT: 39.5 % (ref 36.0–46.0)
Hemoglobin: 12.3 g/dL (ref 12.0–15.0)
MCH: 27.9 pg (ref 26.0–34.0)
MCHC: 31.1 g/dL (ref 30.0–36.0)
MCV: 89.6 fL (ref 80.0–100.0)
Platelets: 255 10*3/uL (ref 150–400)
RBC: 4.41 MIL/uL (ref 3.87–5.11)
RDW: 13.5 % (ref 11.5–15.5)
WBC: 4.2 10*3/uL (ref 4.0–10.5)
nRBC: 0 % (ref 0.0–0.2)

## 2019-02-03 LAB — MRSA PCR SCREENING: MRSA by PCR: NEGATIVE

## 2019-02-03 MED ORDER — EPINEPHRINE 0.3 MG/0.3ML IJ SOAJ: INTRAMUSCULAR | 1 refills | Status: DC

## 2019-02-03 MED ORDER — POTASSIUM CHLORIDE CRYS ER 20 MEQ PO TBCR
40.0000 meq | EXTENDED_RELEASE_TABLET | Freq: Once | ORAL | Status: AC
  Administered 2019-02-03: 40 meq via ORAL
  Filled 2019-02-03: qty 2

## 2019-02-03 MED ORDER — PREDNISONE 20 MG PO TABS: ORAL_TABLET | ORAL | 0 refills | Status: DC

## 2019-02-03 MED ORDER — DICLOFENAC SODIUM 1 % TD GEL: 2.0000 g | Freq: Two times a day (BID) | TRANSDERMAL | Status: AC | PRN

## 2019-02-03 NOTE — Discharge Summary (Signed)
Physician Discharge Summary  [name] MGQ:[id number] DOB: 05-23-1934 DOA: 02/02/2019  PCP: Fayrene Helper, MD  Admit date: 02/02/2019 Discharge date: 02/03/2019  Admitted From:  Home  Disposition: Home   Recommendations for Outpatient Follow-up:  1. Follow up with PCP in 5-7 days  Discharge Condition: STABLE   CODE STATUS: FULL    Brief Hospitalization Summary: Please see all hospital notes, images, labs for full details of the hospitalization. Dr. Graciela Husbands HPI:  [name]  is a 85 y.o. female, with past medical history of anemia, CAD, chronic back pain, disease, angioedema, GERD, hyperlipidemia, patient presents due to complaints of allergic reaction, reports she started to experiencing trouble swallowing, and possible sensation of facial swelling, some tongue tingling and numbness, some changes in her voice, reports this is similar to her prior allergic reactions, which prompted her to come to ED, she denies fever, chills, chest pain, shortness of breath, focal deficits, tingling or numbness in her arms or legs, no abdominal pain. -In ED patient was noted to have swollen tongue, started to have some changes in her speech and low oxygen saturation, she did receive epinephrine, steroids and antihistamines with improvement of her symptoms, her labs were significant for hypokalemia at 2.6, I was consulted to admit for further management.  Pt was admitted for observation for signs of angioedema with tongue swelling.  The patient received epinephrine, antihistamine, steroids and IV fluids.  She received potassium replacement.  She responded well to this therapy.  Her tongue swelling has resolved.  She is eating and drinking and speaking normally.  We were not able to determined what caused her angioedema. She had been taking doxycycline.  I asked her to stop taking it.  She was given a refill of her epi pens.  She was given prescription for prednisone 40 mg every morning x 5  days.  She feels well to go home. She was advised to follow up with PCP next week for recheck.    Discharge Diagnoses:  Active Problems:   Hyperlipidemia   Essential hypertension   GERD   Hypothyroid   CAD in native artery   Angioedema   Discharge Instructions: Discharge Instructions    Call MD for:  difficulty breathing, headache or visual disturbances   Complete by:  As directed    Call MD for:  difficulty breathing, headache or visual disturbances   Complete by:  As directed    Call MD for:  hives   Complete by:  As directed    Call MD for:  hives   Complete by:  As directed    Call MD for:  persistant dizziness or light-headedness   Complete by:  As directed    Call MD for:  persistant dizziness or light-headedness   Complete by:  As directed    Increase activity slowly   Complete by:  As directed    Increase activity slowly   Complete by:  As directed    Place in observation (patient's expected length of stay will be less than 2 midnights)   Complete by:  As directed      Allergies as of 02/03/2019      Reactions   Ace Inhibitors Anaphylaxis   Was on ventilator for six days due to airway swelling shut while on ACE inhibitors.   Bupropion Hcl Anaphylaxis   Codeine Anaphylaxis   Penicillins Anaphylaxis   Has patient had a PCN reaction causing immediate rash, facial/tongue/throat swelling, SOB or lightheadedness with hypotension: Yes Has patient  had a PCN reaction causing severe rash involving mucus membranes or skin necrosis: No Has patient had a PCN reaction that required hospitalization No Has patient had a PCN reaction occurring within the last 10 years: No If all of the above answers are "NO", then may proceed with Cephalosporin use.   Shellfish Allergy Anaphylaxis   Tongue swells up, Oysters OK   Angiotensin Receptor Blockers Other (See Comments)   Patient unable to recall what medication this might have been and/or her reaction to it.   Bee Venom    Facial  swelling       Medication List    STOP taking these medications   doxycycline 100 MG tablet Commonly known as:  VIBRA-TABS   ferrous sulfate 325 (65 FE) MG tablet     TAKE these medications   amLODipine 10 MG tablet Commonly known as:  NORVASC Take 1 tablet (10 mg total) by mouth daily.   aspirin EC 81 MG tablet Take 81 mg by mouth daily.   atorvastatin 40 MG tablet Commonly known as:  LIPITOR Take 1 tablet (40 mg total) by mouth daily at 6 PM.   B-12 1000 MCG Caps Take 1 capsule by mouth daily.   calcium carbonate 1250 (500 Ca) MG tablet Commonly known as:  OS-CAL - dosed in mg of elemental calcium Take 1 tablet by mouth daily.   diclofenac sodium 1 % Gel Commonly known as:  VOLTAREN Apply 2 g topically 2 (two) times daily as needed (for back pain).   EPINEPHrine 0.3 mg/0.3 mL Soaj injection Commonly known as:  EpiPen 2-Pak inject as directed for SEVERE ALLERGIC REACTIONS   fluticasone 50 MCG/ACT nasal spray Commonly known as:  FLONASE Place 1 spray into both nostrils daily.   hydrochlorothiazide 12.5 MG capsule Commonly known as:  MICROZIDE TAKE 1 CAPSULE(12.5 MG) BY MOUTH DAILY   HYDROcodone-acetaminophen 7.5-325 MG tablet Commonly known as:  NORCO Take 1 tablet by mouth daily as needed for moderate pain.   isosorbide mononitrate 30 MG 24 hr tablet Commonly known as:  IMDUR Take 1 tablet (30 mg total) by mouth daily.   levothyroxine 25 MCG tablet Commonly known as:  SYNTHROID TAKE 1 TABLET BY MOUTH EVERY MORNING   multivitamin with minerals Tabs tablet Take 1 tablet by mouth daily.   nitroGLYCERIN 0.4 MG/SPRAY spray Commonly known as:  NITROLINGUAL Place 1 spray every 5 (five) minutes x 3 doses as needed under the tongue for chest pain.   potassium chloride 10 MEQ tablet Commonly known as:  K-DUR TAKE 1 TABLET(10 MEQ) BY MOUTH DAILY   predniSONE 20 MG tablet Commonly known as:  DELTASONE Take 2 PO QAM x5 days Start taking on:  Feb 04, 2019    vitamin C 500 MG tablet Commonly known as:  ASCORBIC ACID Take 500 mg by mouth daily.      Follow-up Information    Fayrene Helper, MD. Schedule an appointment as soon as possible for a visit in 5 day(s).   Specialty:  Family Medicine Why:  Hospital Follow Up Contact information: [address], [address] [place] [address] [phone]          Allergies  Allergen Reactions  . Ace Inhibitors Anaphylaxis    Was on ventilator for six days due to airway swelling shut while on ACE inhibitors.  . Bupropion Hcl Anaphylaxis  . Codeine Anaphylaxis  . Penicillins Anaphylaxis    Has patient had a PCN reaction causing immediate rash, facial/tongue/throat swelling, SOB  or lightheadedness with hypotension: Yes Has patient had a PCN reaction causing severe rash involving mucus membranes or skin necrosis: No Has patient had a PCN reaction that required hospitalization No Has patient had a PCN reaction occurring within the last 10 years: No If all of the above answers are "NO", then may proceed with Cephalosporin use.   . Shellfish Allergy Anaphylaxis    Tongue swells up, Oysters OK  . Angiotensin Receptor Blockers Other (See Comments)    Patient unable to recall what medication this might have been and/or her reaction to it.  . Bee Venom     Facial swelling     Allergies as of 02/03/2019      Reactions   Ace Inhibitors Anaphylaxis   Was on ventilator for six days due to airway swelling shut while on ACE inhibitors.   Bupropion Hcl Anaphylaxis   Codeine Anaphylaxis   Penicillins Anaphylaxis   Has patient had a PCN reaction causing immediate rash, facial/tongue/throat swelling, SOB or lightheadedness with hypotension: Yes Has patient had a PCN reaction causing severe rash involving mucus membranes or skin necrosis: No Has patient had a PCN reaction that required hospitalization No Has patient had a PCN reaction occurring within the last 10 years: No If all of the above  answers are "NO", then may proceed with Cephalosporin use.   Shellfish Allergy Anaphylaxis   Tongue swells up, Oysters OK   Angiotensin Receptor Blockers Other (See Comments)   Patient unable to recall what medication this might have been and/or her reaction to it.   Bee Venom    Facial swelling       Medication List    STOP taking these medications   doxycycline 100 MG tablet Commonly known as:  VIBRA-TABS   ferrous sulfate 325 (65 FE) MG tablet     TAKE these medications   amLODipine 10 MG tablet Commonly known as:  NORVASC Take 1 tablet (10 mg total) by mouth daily.   aspirin EC 81 MG tablet Take 81 mg by mouth daily.   atorvastatin 40 MG tablet Commonly known as:  LIPITOR Take 1 tablet (40 mg total) by mouth daily at 6 PM.   B-12 1000 MCG Caps Take 1 capsule by mouth daily.   calcium carbonate 1250 (500 Ca) MG tablet Commonly known as:  OS-CAL - dosed in mg of elemental calcium Take 1 tablet by mouth daily.   diclofenac sodium 1 % Gel Commonly known as:  VOLTAREN Apply 2 g topically 2 (two) times daily as needed (for back pain).   EPINEPHrine 0.3 mg/0.3 mL Soaj injection Commonly known as:  EpiPen 2-Pak inject as directed for SEVERE ALLERGIC REACTIONS   fluticasone 50 MCG/ACT nasal spray Commonly known as:  FLONASE Place 1 spray into both nostrils daily.   hydrochlorothiazide 12.5 MG capsule Commonly known as:  MICROZIDE TAKE 1 CAPSULE(12.5 MG) BY MOUTH DAILY   HYDROcodone-acetaminophen 7.5-325 MG tablet Commonly known as:  NORCO Take 1 tablet by mouth daily as needed for moderate pain.   isosorbide mononitrate 30 MG 24 hr tablet Commonly known as:  IMDUR Take 1 tablet (30 mg total) by mouth daily.   levothyroxine 25 MCG tablet Commonly known as:  SYNTHROID TAKE 1 TABLET BY MOUTH EVERY MORNING   multivitamin with minerals Tabs tablet Take 1 tablet by mouth daily.   nitroGLYCERIN 0.4 MG/SPRAY spray Commonly known as:  NITROLINGUAL Place 1 spray  every 5 (five) minutes x 3 doses as needed under the tongue for  chest pain.   potassium chloride 10 MEQ tablet Commonly known as:  K-DUR TAKE 1 TABLET(10 MEQ) BY MOUTH DAILY   predniSONE 20 MG tablet Commonly known as:  DELTASONE Take 2 PO QAM x5 days Start taking on:  Feb 04, 2019   vitamin C 500 MG tablet Commonly known as:  ASCORBIC ACID Take 500 mg by mouth daily.       Procedures/Studies: Mm 3d Screen Breast Bilateral  Result Date: 01/22/2019 CLINICAL DATA:  Screening. EXAM: DIGITAL SCREENING BILATERAL MAMMOGRAM WITH TOMO AND CAD COMPARISON:  Previous exam(s). ACR Breast Density Category a: The breast tissue is almost entirely fatty. FINDINGS: There are no findings suspicious for malignancy. Images were processed with CAD. IMPRESSION: No mammographic evidence of malignancy. A result letter of this screening mammogram will be mailed directly to the patient. RECOMMENDATION: Screening mammogram in one year. (Code:SM-B-01Y) BI-RADS CATEGORY  1: Negative. Electronically Signed   By: Franki Cabot M.D.   On: 01/22/2019 16:52      Subjective: Pt without complaints.  She is eating and drinking and taking her medications with no difficulty.   Discharge Exam: Vitals:   02/03/19 0800 02/03/19 0900  BP: (!) 149/93   Pulse: 78   Resp: 17   Temp:  98 F (36.7 C)  SpO2: 98%    Vitals:   02/03/19 0600 02/03/19 0700 02/03/19 0800 02/03/19 0900  BP: (!) 148/92 (!) 149/105 (!) 149/93   Pulse: 80 81 78   Resp: 19 15 17    Temp:    98 F (36.7 C)  TempSrc:    Axillary  SpO2: 95% 93% 98%   Weight:      Height:        General: Pt is alert, awake, not in acute distress, no lip or tongue swelling.  No facial edema.  Cardiovascular: RRR, S1/S2 +, no rubs, no gallops Respiratory: CTA bilaterally, no wheezing, no rhonchi Abdominal: Soft, NT, ND, bowel sounds + Extremities: no edema, no cyanosis   The results of significant diagnostics from this hospitalization (including imaging,  microbiology, ancillary and laboratory) are listed below for reference.     Microbiology: Recent Results (from the past 240 hour(s))  SARS Coronavirus 2 (CEPHEID - Performed in [place] hospital lab), Hosp Order     Status: None   Collection Time: 02/02/19  8:46 PM  Result Value Ref Range Status   SARS Coronavirus 2 NEGATIVE NEGATIVE Final    Comment: (NOTE) If result is NEGATIVE SARS-CoV-2 target nucleic acids are NOT DETECTED. The SARS-CoV-2 RNA is generally detectable in upper and lower  respiratory specimens during the acute phase of infection. The lowest  concentration of SARS-CoV-2 viral copies this assay can detect is 250  copies / mL. A negative result does not preclude SARS-CoV-2 infection  and should not be used as the sole basis for treatment or other  patient management decisions.  A negative result may occur with  improper specimen collection / handling, submission of specimen other  than nasopharyngeal swab, presence of viral mutation(s) within the  areas targeted by this assay, and inadequate number of viral copies  (<250 copies / mL). A negative result must be combined with clinical  observations, patient history, and epidemiological information. If result is POSITIVE SARS-CoV-2 target nucleic acids are DETECTED. The SARS-CoV-2 RNA is generally detectable in upper and lower  respiratory specimens dur ing the acute phase of infection.  Positive  results are indicative of active infection with SARS-CoV-2.  Clinical  correlation  with patient history and other diagnostic information is  necessary to determine patient infection status.  Positive results do  not rule out bacterial infection or co-infection with other viruses. If result is PRESUMPTIVE POSTIVE SARS-CoV-2 nucleic acids MAY BE PRESENT.   A presumptive positive result was obtained on the submitted specimen  and confirmed on repeat testing.  While 2019 novel coronavirus  (SARS-CoV-2) nucleic acids may be  present in the submitted sample  additional confirmatory testing may be necessary for epidemiological  and / or clinical management purposes  to differentiate between  SARS-CoV-2 and other Sarbecovirus currently known to infect humans.  If clinically indicated additional testing with an alternate test  methodology [phone]) is advised. The SARS-CoV-2 RNA is generally  detectable in upper and lower respiratory sp ecimens during the acute  phase of infection. The expected result is Negative. Fact Sheet for Patients:  StrictlyIdeas.no Fact Sheet for Healthcare Providers: BankingDealers.co.za This test is not yet approved or cleared by the [place] FDA and has been authorized for detection and/or diagnosis of SARS-CoV-2 by FDA under an Emergency Use Authorization (EUA).  This EUA will remain in effect (meaning this test can be used) for the duration of the COVID-19 declaration under Section 564(b)(1) of the Act, 21 U.S.C. section 360bbb-3(b)(1), unless the authorization is terminated or revoked sooner. Performed at [facility], [address], [place], [place] [address]   MRSA PCR Screening     Status: None   Collection Time: 02/02/19 10:30 PM  Result Value Ref Range Status   MRSA by PCR NEGATIVE NEGATIVE Final    Comment:        The GeneXpert MRSA Assay (FDA approved for NASAL specimens only), is one component of a comprehensive MRSA colonization surveillance program. It is not intended to diagnose MRSA infection nor to guide or monitor treatment for MRSA infections. Performed at [facility], [address], [place], [place] [address]      Labs: BNP (last 3 results) No results for input(s): BNP in the last 8760 hours. Basic Metabolic Panel: Recent Labs  Lab 02/02/19 2000 02/03/19 0427  NA 139 139  K 2.6* 3.1*  CL 104 105  CO2 28 26  GLUCOSE 103* 167*  BUN 22 17  CREATININE 0.87 0.85  CALCIUM 9.0 9.3   Liver  Function Tests: Recent Labs  Lab 02/02/19 2000  AST 24  ALT 15  ALKPHOS 90  BILITOT 0.5  PROT 6.9  ALBUMIN 3.8   No results for input(s): LIPASE, AMYLASE in the last 168 hours. No results for input(s): AMMONIA in the last 168 hours. CBC: Recent Labs  Lab 02/02/19 2000 02/03/19 0427  WBC 6.3 4.2  HGB 11.6* 12.3  HCT 36.7 39.5  MCV 90.4 89.6  PLT 229 255   Cardiac Enzymes: No results for input(s): CKTOTAL, CKMB, CKMBINDEX, TROPONINI in the last 168 hours. BNP: Invalid input(s): POCBNP CBG: No results for input(s): GLUCAP in the last 168 hours. D-Dimer No results for input(s): DDIMER in the last 72 hours. Hgb A1c No results for input(s): HGBA1C in the last 72 hours. Lipid Profile No results for input(s): CHOL, HDL, LDLCALC, TRIG, CHOLHDL, LDLDIRECT in the last 72 hours. Thyroid function studies No results for input(s): TSH, T4TOTAL, T3FREE, THYROIDAB in the last 72 hours.  Invalid input(s): FREET3 Anemia work up No results for input(s): VITAMINB12, FOLATE, FERRITIN, TIBC, IRON, RETICCTPCT in the last 72 hours. Urinalysis    Component Value Date/Time   COLORURINE YELLOW 08/11/2018 1134  APPEARANCEUR CLOUDY (A) 08/11/2018 1134   LABSPEC 1.020 08/11/2018 1134   PHURINE > OR = 8.5 (A) 08/11/2018 1134   GLUCOSEU NEGATIVE 08/11/2018 1134   GLUCOSEU NEG mg/dL 07/04/2008 0105   HGBUR 1+ (A) 08/11/2018 1134   HGBUR trace-intact 07/03/2008 1303   BILIRUBINUR NEGATIVE 11/24/2017 1426   BILIRUBINUR neg 11/14/2017 1533   KETONESUR NEGATIVE 08/11/2018 1134   PROTEINUR 2+ (A) 08/11/2018 1134   UROBILINOGEN 0.2 11/14/2017 1533   UROBILINOGEN 0.2 06/07/2010 1020   NITRITE NEGATIVE 11/24/2017 1426   LEUKOCYTESUR TRACE (A) 11/24/2017 1426   Sepsis Labs Invalid input(s): PROCALCITONIN,  WBC,  LACTICIDVEN Microbiology Recent Results (from the past 240 hour(s))  SARS Coronavirus 2 (CEPHEID - Performed in [place] hospital lab), Hosp Order     Status: None   Collection  Time: 02/02/19  8:46 PM  Result Value Ref Range Status   SARS Coronavirus 2 NEGATIVE NEGATIVE Final    Comment: (NOTE) If result is NEGATIVE SARS-CoV-2 target nucleic acids are NOT DETECTED. The SARS-CoV-2 RNA is generally detectable in upper and lower  respiratory specimens during the acute phase of infection. The lowest  concentration of SARS-CoV-2 viral copies this assay can detect is 250  copies / mL. A negative result does not preclude SARS-CoV-2 infection  and should not be used as the sole basis for treatment or other  patient management decisions.  A negative result may occur with  improper specimen collection / handling, submission of specimen other  than nasopharyngeal swab, presence of viral mutation(s) within the  areas targeted by this assay, and inadequate number of viral copies  (<250 copies / mL). A negative result must be combined with clinical  observations, patient history, and epidemiological information. If result is POSITIVE SARS-CoV-2 target nucleic acids are DETECTED. The SARS-CoV-2 RNA is generally detectable in upper and lower  respiratory specimens dur ing the acute phase of infection.  Positive  results are indicative of active infection with SARS-CoV-2.  Clinical  correlation with patient history and other diagnostic information is  necessary to determine patient infection status.  Positive results do  not rule out bacterial infection or co-infection with other viruses. If result is PRESUMPTIVE POSTIVE SARS-CoV-2 nucleic acids MAY BE PRESENT.   A presumptive positive result was obtained on the submitted specimen  and confirmed on repeat testing.  While 2019 novel coronavirus  (SARS-CoV-2) nucleic acids may be present in the submitted sample  additional confirmatory testing may be necessary for epidemiological  and / or clinical management purposes  to differentiate between  SARS-CoV-2 and other Sarbecovirus currently known to infect humans.  If  clinically indicated additional testing with an alternate test  methodology [phone]) is advised. The SARS-CoV-2 RNA is generally  detectable in upper and lower respiratory sp ecimens during the acute  phase of infection. The expected result is Negative. Fact Sheet for Patients:  StrictlyIdeas.no Fact Sheet for Healthcare Providers: BankingDealers.co.za This test is not yet approved or cleared by the [place] FDA and has been authorized for detection and/or diagnosis of SARS-CoV-2 by FDA under an Emergency Use Authorization (EUA).  This EUA will remain in effect (meaning this test can be used) for the duration of the COVID-19 declaration under Section 564(b)(1) of the Act, 21 U.S.C. section 360bbb-3(b)(1), unless the authorization is terminated or revoked sooner. Performed at [facility], [address] [place], [place] [address]   MRSA PCR Screening     Status: None   Collection Time: 02/02/19 10:30 PM  Result Value Ref Range Status   MRSA by PCR NEGATIVE NEGATIVE Final    Comment:        The GeneXpert MRSA Assay (FDA approved for NASAL specimens only), is one component of a comprehensive MRSA colonization surveillance program. It is not intended to diagnose MRSA infection nor to guide or monitor treatment for MRSA infections. Performed at [facility], [address] [place], [place] [address]     Time coordinating discharge:   SIGNED:  Irwin Brakeman, MD  [facility] 02/03/2019, 10:50 AM How to contact the [facility] Attending or Consulting provider [place] or covering provider during after hours [place], for this patient?  1. Check the care team in [facility] and look for a) attending/consulting [facility] provider listed and b) the [facility] team listed 2. Log into [email or web address] and use [place] universal password to access. If you do not have the password, please contact the hospital operator. 3. Locate the [facility] provider you are  looking for under [facility] and page to a number that you can be directly reached. 4. If you still have difficulty reaching the provider, please page the [facility] (Director on Call) for the Hospitalists listed on amion for assistance.

## 2019-02-03 NOTE — Discharge Instructions (Signed)
Angioedema Angioedema is the sudden swelling of tissue in the body. Angioedema can affect any part of the body, but it most often affects the deeper parts of the skin, causing red, itchy patches (hives) to appear over the affected area. It often begins during the night and is found in the morning. Depending on the cause, angioedema may happen:  Only once.  Several times. It may come back in unpredictable patterns.  Repeatedly for several years. Over time, it may gradually stop coming back. Angioedema can be life-threatening if it affects the air passages that you breathe through. What are the causes? This condition may be caused by:  Foods, such as milk, eggs, shellfish, wheat, or nuts.  Certain medicines, such as ACE inhibitors, antibiotics, nonsteroidal anti-inflammatory drugs, birth control pills, or dyes used in X-rays.  Insect stings.  Infections. Angioedema can be inherited, and episodes can be triggered by:  Mild injury.  Dental work.  Surgery.  Stress.  Sudden changes in temperature.  Exercise. In some cases, the cause of this condition is not known. What are the signs or symptoms? Symptoms of this condition depend on where the swelling happens. Symptoms may include:  Swollen skin.  Red, itchy patches of skin (hives).  Redness in the affected area.  Pain in the affected area.  Swollen lips or tongue.  Wheezing.  Breathing problems.  If your internal organs are involved, symptoms may also include:  Nausea.  Abdominal pain.  Vomiting.  Difficulty swallowing.  Difficulty passing urine. How is this diagnosed? This condition may be diagnosed based on:  An exam of the affected area.  Your medical history.  Whether anyone in your family has had this condition before.  A review of any medicines you have been taking.  Tests, including: ? Allergy skin tests to see if the condition was caused by an allergic reaction. ? Blood tests to see if the  condition was caused by a gene. ? Tests to check for underlying diseases that could cause the condition. How is this treated? Treatment for this condition depends on the cause. It may involve any of the following:  If something triggered the condition, making changes to keep it from triggering the condition again.  If the condition affects your breathing, having tubes placed in your airway to keep it open.  Taking medicines to treat symptoms or prevent future episodes. These may include: ? Antihistamines. ? Epinephrine injections. ? Steroids. If your condition is severe, you may need to be treated at the hospital. Angioedema usually gets better in 24-48 hours. Follow these instructions at home:  Take over-the-counter and prescription medicines only as told by your health care provider.  If you were given medicines for emergency allergy treatment, always carry them with you.  Wear a medical bracelet as told by your health care provider.  If something triggers your condition, avoid the trigger, if possible.  If your condition is inherited and you are thinking about having children, talk to your health care provider. It is important to discuss the risks of passing on the condition to your children. Contact a health care provider if:  You have repeated episodes of angioedema.  Episodes of angioedema start to happen more often than they used to, even after you take steps to prevent them.  You have episodes of angioedema that are more severe than they have been before, even after you take steps to prevent them.  You are thinking about having children. Get help right away if:  You  have severe swelling of your mouth, tongue, or lips.  You have trouble breathing.  You have trouble swallowing.  You faint. This information is not intended to replace advice given to you by your health care provider. Make sure you discuss any questions you have with your health care provider. Document  Released: 11/01/2001 Document Revised: 03/20/2016 Document Reviewed: 03/02/2016 Elsevier Interactive Patient Education  2019 Elsevier Inc.     Epinephrine Injection What is epinephrine?  Epinephrine is a medicine that is given as a shot (injection) to temporarily treat a life-threatening allergic reaction. It may also be used to treat severe asthma attacks, other lung problems, and other emergency conditions. Epinephrine works by relaxing the muscles in the airways and tightening the blood vessels. Epinephrine comes in many forms, including what is commonly called an auto-injector "pen" (pre-filled automatic epinephrine injection device). You may hear other names that mean the same thing, including epinephrine injection, epinephrine auto-injector pen, epinephrine pen, and automatic injection device. Why do I need epinephrine? You need epinephrine if you experience a severe asthma attack or a life-threatening allergic reaction (anaphylaxis). This injection can be lifesaving. You should always carry an auto-injector pen with you if you are at risk for anaphylaxis. When should I use my auto-injector pen? Use your auto-injector pen as soon as you think you are experiencing anaphylaxis or a severe asthmatic attack, as told by your health care provider. Anaphylaxis is very dangerous if it is not treated right away. Signs and symptoms of anaphylaxis may include:  Nasal congestion.  Tingling in the mouth.  An itchy, red rash.  Swelling of the eyes, lips, face, or tongue.  Swelling of the back of the mouth and the throat.  Wheezing.  A hoarse voice.  Itchy, red, swollen areas of skin (hives).  Dizziness or light-headedness.  Fainting.  Anxiety or confusion.  Abdominal pain.  Difficulty breathing, speaking, or swallowing.  Chest tightness.  Fast or irregular heartbeats (palpitations).  Vomiting.  Diarrhea. These symptoms may represent a serious problem that is an emergency. Do  not wait to see if the symptoms will go away. Use your auto-injector pen as you have been instructed, and get medical help right away. Call your local emergency services (911 in the [place] Do not drive yourself to the hospital. How do I use an auto-injector pen?  Use epinephrine exactly as told by your health care provider. Do not inject it more often or in greater or smaller doses than your health care provider prescribed. Most auto-injector pens contain one dose of epinephrine. Some contain two doses.  You may use your auto-injector pen to give an injection under your skin or into your muscle on the outer side of your thigh. Do not give yourself an injection into your buttocks or any other part of your body. ? In an emergency, you can use your auto-injector pen through your clothing. ? After you inject a dose of epinephrine, some liquid may remain in your auto-injector pen. This is normal.  If you need to give yourself a second dose of epinephrine, give the second injection in another location on your outer thigh. Do not give two injections in exactly the same place on your body. This can lead to tissue damage.  Talk with your pharmacist or health care provider if you have questions about how to inject epinephrine correctly. When should I seek immediate medical care? Seek emergency medical treatment immediately after you inject epinephrine. You may need additional medical care, and you may  be monitored for side effects of epinephrine, such as:  Difficulty breathing.  Fast or irregular heartbeat.  Nausea or vomiting.  Sweating.  Dizziness.  Nervousness or anxiety.  Weakness.  Pale skin.  Headache.  Uncontrollable shaking. This information is not intended to replace advice given to you by your health care provider. Make sure you discuss any questions you have with your health care provider. Document Released: 08/20/2000 Document Revised: 04/17/2018 Document Reviewed:  03/12/2015 Elsevier Interactive Patient Education  2019 Elsevier Inc.   IMPORTANT INFORMATION: PAY CLOSE ATTENTION   PHYSICIAN DISCHARGE INSTRUCTIONS  Follow with Primary care provider  Fayrene Helper, MD  and other consultants as instructed your Hospitalist Physician  SEEK MEDICAL CARE OR RETURN TO EMERGENCY ROOM IF SYMPTOMS COME BACK, WORSEN OR NEW PROBLEM DEVELOPS.   Please note: You were cared for by a hospitalist during your hospital stay. Every effort will be made to forward records to your primary care provider.  You can request that your primary care provider send for your hospital records if they have not received them.  Once you are discharged, your primary care physician will handle any further medical issues. Please note that NO REFILLS for any discharge medications will be authorized once you are discharged, as it is imperative that you return to your primary care physician (or establish a relationship with a primary care physician if you do not have one) for your post hospital discharge needs so that they can reassess your need for medications and monitor your lab values.  Please get a complete blood count and chemistry panel checked by your Primary MD at your next visit, and again as instructed by your Primary MD.  Get Medicines reviewed and adjusted: Please take all your medications with you for your next visit with your Primary MD  Laboratory/radiological data: Please request your Primary MD to go over all hospital tests and procedure/radiological results at the follow up, please ask your primary care provider to get all Hospital records sent to his/her office.  In some cases, they will be blood work, cultures and biopsy results pending at the time of your discharge. Please request that your primary care provider follow up on these results.  If you are diabetic, please bring your blood sugar readings with you to your follow up appointment with primary care.    Please  call and make your follow up appointments as soon as possible.    Also Note the following: If you experience worsening of your admission symptoms, develop shortness of breath, life threatening emergency, suicidal or homicidal thoughts you must seek medical attention immediately by calling 911 or calling your MD immediately  if symptoms less severe.  You must read complete instructions/literature along with all the possible adverse reactions/side effects for all the Medicines you take and that have been prescribed to you. Take any new Medicines after you have completely understood and accpet all the possible adverse reactions/side effects.   Do not drive when taking Pain medications or sleeping medications (Benzodiazepines)  Do not take more than prescribed Pain, Sleep and Anxiety Medications. It is not advisable to combine anxiety,sleep and pain medications without talking with your primary care practitioner  Special Instructions: If you have smoked or chewed Tobacco  in the last 2 yrs please stop smoking, stop any regular Alcohol  and or any Recreational drug use.  Wear Seat belts while driving.

## 2019-02-05 ENCOUNTER — Telehealth: Payer: Self-pay

## 2019-02-05 ENCOUNTER — Telehealth: Payer: Self-pay | Admitting: Family Medicine

## 2019-02-05 NOTE — Telephone Encounter (Signed)
-----   Message from Fayrene Helper, MD sent at 02/05/2019  8:16 AM EDT ----- Regarding: neeeds transitional call and appt please, recent hospitalization

## 2019-02-05 NOTE — Telephone Encounter (Signed)
Transition Care Management Follow-up Telephone Call   Date discharged?   02/03/2019            How have you been since you were released from the hospital? feels a lot better   Do you understand why you were in the hospital? Tongue was swelling and unable to speak    Do you understand the discharge instructions? yes   Where were you discharged to? home   Items Reviewed:  Medications reviewed: yes  Allergies reviewed: yes  Dietary changes reviewed: yes  Referrals reviewed: yes   Functional Questionnaire:   Activities of Daily Living (ADLs):  yes but has daughter to help if she needs    Any transportation issues/concerns?: no   Any patient concerns? no   Confirmed importance and date/time of follow-up visits scheduled 02/14/19 at 10:40am     Confirmed with patient if condition begins to worsen call PCP or go to the ER.  Patient was given the office number and encouraged to call back with question or concerns.  yes with verbal understanding

## 2019-02-05 NOTE — Telephone Encounter (Signed)
The answering machine is full, no answer at both numbers.

## 2019-02-06 NOTE — Telephone Encounter (Signed)
Spoke with patient and completed toc call

## 2019-02-14 ENCOUNTER — Ambulatory Visit (INDEPENDENT_AMBULATORY_CARE_PROVIDER_SITE_OTHER): Payer: Medicare HMO | Admitting: Family Medicine

## 2019-02-14 ENCOUNTER — Other Ambulatory Visit: Payer: Self-pay

## 2019-02-14 ENCOUNTER — Encounter: Payer: Self-pay | Admitting: Family Medicine

## 2019-02-14 VITALS — BP 124/80 | HR 97 | Resp 15 | Ht 63.0 in | Wt 156.0 lb

## 2019-02-14 DIAGNOSIS — E876 Hypokalemia: Secondary | ICD-10-CM | POA: Diagnosis not present

## 2019-02-14 DIAGNOSIS — T783XXD Angioneurotic edema, subsequent encounter: Secondary | ICD-10-CM | POA: Diagnosis not present

## 2019-02-14 DIAGNOSIS — Z09 Encounter for follow-up examination after completed treatment for conditions other than malignant neoplasm: Secondary | ICD-10-CM

## 2019-02-14 NOTE — Patient Instructions (Addendum)
F/u in September,as before, call if you need me sooner  You  Will be referred back to Allergist for re eval and I will send in refills of your epi pen  Chem 7 and EGFR today due to low potassium   PLEASE at the sign of any tongue , lip or facial swelling, or difficulty breathing or swa;llowing  Go to the ED, as you have SEVERE allergic and potentially life threatening reactions  Social distancing. Frequent hand washing with soap and water Keeping your hands off of your face. These 3 practices will help to keep both you and your community healthy during this time. Please practice them faithfully!    Anaphylactic Reaction, Adult An anaphylactic reaction (anaphylaxis) is a sudden, serious allergic reaction. This affects more than one part of your body. It can be life-threatening. If you have an anaphylactic reaction, you need to get medical help right away. What are the causes? This condition is caused by exposure to things that give you an allergic reaction (allergens). Common allergens include:  Foods, such as peanuts, wheat, shellfish, milk, and eggs.  Medicines.  Insect bites or stings.  Blood or parts of blood received for treatment (transfusions).  Chemicals, such as latex and dyes that are used in food and in medical tests. What are the signs or symptoms? Signs of an anaphylactic reaction may include:  Feeling warm in the face (flushed). Your face may turn red.  Itchy, red, swollen areas of skin (hives).  Swelling of the: ? Eyes. ? Lips. ? Face. ? Mouth. ? Tongue. ? Throat.  Trouble with any of these: ? Breathing. ? Talking. ? Swallowing.  Loud breathing (wheezing).  Feeling dizzy or light-headed.  Passing out (fainting).  Pain or cramps in your belly.  Throwing up (vomiting).  Watery poop (diarrhea). How is this diagnosed? This condition is diagnosed based on:  Your symptoms.  A physical exam.  Blood tests.  Recent exposure to things that  give you an allergic reaction. How is this treated? If you think you are having an anaphylactic reaction, you should do this right away:  Give yourself a shot of medicine (epinephrine) using an auto-injector "pen." Your doctor will teach you how to use this pen.  Call for emergency help. If you use a pen, you must still get treated in the hospital. There, you may be given: ? Medicines. ? Oxygen. ? Fluids in an IV tube. Follow these instructions at home: Safety  Always keep an auto-injector pen with you. This could save your life. Use it as told by your doctor.  Do not drive after a reaction. Wait until your doctor says it is safe to drive.  Make sure that you, the people who live with you, and your employer know: ? What you are allergic to, so you can stay away from it. ? How to use your auto-injector pen.  Wear a bracelet or necklace that says you have an allergy, if your doctor tells you to do this.  Learn the signs of a very bad allergic reaction. This way, you can treat it right away.  Work with your doctors to make a plan for what to do if you have a very bad reaction. It is important to be ready. If you use your auto-injector pen:   Get more medicine (epinephrine) for your pen right away. This is important in case you have another reaction.  Get help right away. To avoid a serious allergic reaction:  Avoid things that gave  you a very bad allergic reaction before.  Tell your server about your allergy when you go out to eat. If you are not sure if your meal has food that you are allergic to, ask your server before you eat it. General instructions  Take over-the-counter and prescription medicines only as told by your doctor.  If you have itchy, red, swollen areas of skin or a rash: ? Use an over-the-counter medicine (antihistamine) as told by your doctor. ? Put cold, wet cloths on your skin. ? Take a cool bath or shower. Avoid hot water.  Tell all doctors who care for  you that you have an allergy.  Keep all follow-up visits as told by your doctor. This is important. Get help right away if:  You have signs of an allergic reaction. You may notice them soon after being exposed to things that give you an allergic reaction. Signs may include: ? Warmth in your face. Your face may turn red. ? Itchy, red, swollen areas of skin. ? Swelling of your:  Eyes.  Lips.  Face.  Mouth.  Tongue.  Throat. ? Trouble with any of these:  Breathing.  Talking.  Swallowing. ? Loud breathing (wheezing). ? Feeling dizzy or light-headed. ? Passing out. ? Pain or cramps in your belly. ? Throwing up. ? Watery poop.  You had to use your auto-injector pen. You must go to the emergency room even if the medicine seems to be working. This is because another allergic reaction may happen within 3 days (rebound anaphylaxis). These symptoms may be an emergency. Do not wait to see if the symptoms will go away. Do this right away:  Use your auto-injector pen as you have been told.  Get medical help. Call your local emergency services (911 in the [place] Do not drive yourself to the hospital. Summary  An anaphylactic reaction (anaphylaxis) is a sudden, serious allergic reaction.  This condition can be life-threatening. If you have a reaction, get medical help right away.  Your doctor will show you how to give yourself a shot (epinephrine injection) with an auto-injector "pen."  Always keep an auto-injector pen with you. It could save your life. Use it as told by your doctor.  If you had to use your auto-injector pen, you must go to the emergency room. Go there even if the medicine seems to be working. This information is not intended to replace advice given to you by your health care provider. Make sure you discuss any questions you have with your health care provider. Document Released: 02/09/2008 Document Revised: 12/15/2017 Document Reviewed: 12/15/2017 Elsevier  Interactive Patient Education  [employer].

## 2019-02-15 LAB — BASIC METABOLIC PANEL WITH GFR
BUN/Creatinine Ratio: 18 (calc) (ref 6–22)
BUN: 19 mg/dL (ref 7–25)
CO2: 32 mmol/L (ref 20–32)
Calcium: 8.9 mg/dL (ref 8.6–10.4)
Chloride: 102 mmol/L (ref 98–110)
Creat: 1.06 mg/dL — ABNORMAL HIGH (ref 0.60–0.88)
GFR, Est African American: 57 mL/min/{1.73_m2} — ABNORMAL LOW (ref >=60)
GFR, Est Non African American: 49 mL/min/{1.73_m2} — ABNORMAL LOW (ref >=60)
Glucose, Bld: 102 mg/dL (ref 65–139)
Potassium: 3.5 mmol/L (ref 3.5–5.3)
Sodium: 140 mmol/L (ref 135–146)

## 2019-02-16 ENCOUNTER — Encounter: Payer: Self-pay | Admitting: Family Medicine

## 2019-02-16 DIAGNOSIS — M1711 Unilateral primary osteoarthritis, right knee: Secondary | ICD-10-CM | POA: Diagnosis not present

## 2019-02-16 DIAGNOSIS — Z09 Encounter for follow-up examination after completed treatment for conditions other than malignant neoplasm: Secondary | ICD-10-CM | POA: Insufficient documentation

## 2019-02-16 DIAGNOSIS — M545 Low back pain: Secondary | ICD-10-CM | POA: Diagnosis not present

## 2019-02-16 DIAGNOSIS — M47816 Spondylosis without myelopathy or radiculopathy, lumbar region: Secondary | ICD-10-CM | POA: Diagnosis not present

## 2019-02-16 DIAGNOSIS — G894 Chronic pain syndrome: Secondary | ICD-10-CM | POA: Diagnosis not present

## 2019-02-16 NOTE — Progress Notes (Signed)
   [name]     MRN: [id number]      DOB: [birth year]   HPI [name] is here for follow up of hospitalization from 05/29 to 02/03/2019 when she was admitted for angioedema due to unknown trigger. She has  Been previously admitted with this dx, cause at that time was ACE inhibitor. States she herself was unaware of how significantly deconditioned she was , just felt her tongue heavy, and it was actually a relative living nearby, who based on a phone conversation advised ED eval Also reports that all her epi pens had expired , and needs refill on file Has had allergy eval in the past , but re eval indicated due to recent acute and severe event Has completed prednisone taper and denies any current airway compromise , difficulty with speech or swallowing since that time. ROS See HPI  Denies recent fever or chills. Denies sinus pressure, nasal congestion, ear pain or sore throat. Denies chest congestion, productive cough or wheezing. Denies chest pains, palpitations and leg swelling Denies abdominal pain, nausea, vomiting,diarrhea or constipation.   Denies dysuria, frequency, hesitancy or incontinence. Denies uncontrolled joint pain, swelling and does have  limitation in mobility. Denies headaches, seizures, numbness, or tingling. Denies depression, anxiety or insomnia. Denies skin break down or rash.   PE  BP 124/80   Pulse 97   Resp 15   Ht 5\' 3"  (1.6 m)   Wt 156 lb (70.8 kg)   SpO2 95%   BMI 27.63 kg/m   Patient alert and oriented and in no cardiopulmonary distress.  HEENT: No facial asymmetry, EOMI,   oropharynx pink and moist. No airway compromise on exam. Neck supple no JVD, no mass.  Chest: Clear to auscultation bilaterally.  CVS: S1, S2 no murmurs, no S3.Regular rate.  ABD: Soft non tender.   Ext: No edema  RD:[id number]  ROM spine, shoulders, hips and knees.  Skin: Intact, no ulcerations or rash noted.  Psych: Good eye contact, normal affect. Memory  intact not anxious or depressed appearing.  CNS: CN 2-12 intact, power,  normal throughout.no focal deficits noted.   [facility] discharge follow-up Patient in for follow up of recent hospitalization. Discharge summary, and laboratory and radiology data are reviewed, and any questions or concerns about recent hospitalization are discussed. Specific issues requiring follow up are specifically addressed.

## 2019-02-16 NOTE — Assessment & Plan Note (Signed)
Patient in for follow up of recent hospitalization. Discharge summary, and laboratory and radiology data are reviewed, and any questions or concerns about recent hospitalization are discussed. Specific issues requiring follow up are specifically addressed.  

## 2019-02-21 ENCOUNTER — Ambulatory Visit (INDEPENDENT_AMBULATORY_CARE_PROVIDER_SITE_OTHER): Payer: Medicare HMO | Admitting: Allergy & Immunology

## 2019-02-21 ENCOUNTER — Other Ambulatory Visit: Payer: Self-pay

## 2019-02-21 ENCOUNTER — Encounter: Payer: Self-pay | Admitting: Allergy & Immunology

## 2019-02-21 VITALS — BP 124/72 | HR 98 | Temp 97.8°F | Resp 18

## 2019-02-21 DIAGNOSIS — T782XXD Anaphylactic shock, unspecified, subsequent encounter: Secondary | ICD-10-CM | POA: Diagnosis not present

## 2019-02-21 DIAGNOSIS — J4520 Mild intermittent asthma, uncomplicated: Secondary | ICD-10-CM

## 2019-02-21 NOTE — Patient Instructions (Addendum)
1. 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 - I am going to go ahead and get an alpha gal panel, which is a sensitivity to red meat. - This is caused by tick bites, so I just want to make sure that this is not what we are working.  - We are going to get another serum tryptase again.  - I am going to get another stinging insect panel to see if something else has developed.  - This might have been related to the doxycycline, but we will not be able to know unless we do a challenge in the office.   2. Return in about 6 months (around 08/23/2019). This can be an in-person, a virtual Webex or a telephone follow up visit.   Please inform us of any Emergency Department visits, hospitalizations, or changes in symptoms. Call us before going to the ED for breathing or allergy symptoms since we might be able to fit you in for a sick visit. Feel free to contact us anytime with any questions, problems, or concerns.  It was a pleasure to see you again today!  Websites that have reliable patient information: 1. American Academy of Asthma, Allergy, and Immunology: www.aaaai.org 2. Food Allergy Research and Education (FARE): foodallergy.org 3. Mothers of Asthmatics: http://www.asthmacommunitynetwork.org 4. American College of Allergy, Asthma, and Immunology: www.acaai.org  "Like" [place] on Facebook and Instagram for our latest updates!      Make sure you are registered to vote! If you have moved or changed any of your contact information, you will need to get this updated before voting!    Voter ID laws are NOT going into effect for the General Election in November 2020! DO NOT let this stop you from exercising your right to vote!   Absentee voting is the SAFEST way to vote during the coronavirus pandemic! Download and print an absentee ballot request form at [email or web address]://[email or web address]  More  information on absentee ballots can be found here: [email or web address]-[email or web address]

## 2019-02-21 NOTE — Progress Notes (Signed)
FOLLOW UP  Date of Service/Encounter:  02/21/19   Assessment:   Anaphylactic shock - unknown trigger  Possible hymenoptera reaction  History of multiple food allergies (including seafood), but with negative testing  Chronic rhinitis   [name] presents for an evaluation of another episode of facial swelling and tongue swelling.  It is rather unclear from the history was going on.  We are going to get another serum tryptase to make sure that is still within normal range.  We are also going to get a stinging insect panel since she was outdoors on the day of the event.  I think an alpha gal panel might also help in lightness given the history of the tick bite.  The tick bite preceded the episode by 2 to 3 days, and seroconversion to an alpha gal clinical picture takes him much longer than this.  Regardless, it will at least make [place] feel better about her diet.  I do not think there is any reason to go on another fishing expedition regarding food allergies.  She has had an extensive work-up which was negative to the most common foods.  Without a distinct exposure to a food reaction, type of  "fishing expeditions" are not usually helpful.  We are going to reiterate when to use the epinephrine autoinjector.  I did ask her to take a good diary of future reactions and exposures to help guide diagnostic process.   Plan/Recommendations:   1. 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 - I am going to go ahead and get an alpha gal panel, which is a sensitivity to red meat. - This is caused by tick bites, so I just want to make sure that this is not what we are working.  - We are going to get another serum tryptase again.  - I am going to get another stinging insect panel to see if something else has developed.  - This might have been related to the doxycycline, but we will not be able to know unless we do a challenge in the office.   2.  Return in about 6 months (around 08/23/2019). This can be an in-person, a virtual Webex or a telephone follow up visit.   Subjective:   [name] is a 85 y.o. female presenting today for follow up of  Chief Complaint  Patient presents with  . Angioedema    doing better now. no further issues with swelling since the last incident.     [name] has a history of the following: Patient Active Problem List   Diagnosis Date Noted  . Hospital discharge follow-up 02/16/2019  . Angioedema 02/02/2019  . Tick bite 01/02/2019  . Fall 07/20/2017  . Phobia 05/17/2017  . Seafood allergy 01/31/2017  . Overweight (BMI 25.0-29.9) 10/12/2016  . CAD in native artery   . Osteoarthritis 04/17/2013  . Coronary vasospasm ([place]) 02/07/2013  . Hypothyroid 08/14/2012  . Prediabetes 10/10/2011  . CARPAL TUNNEL SYNDROME, BILATERAL 02/19/2010  . GERD 09/15/2009  . Coronary atherosclerosis 10/01/2008  . DYSPEPSIA 07/07/2008  . Back pain of lumbar region with sciatica 07/03/2008  . Hyperlipidemia 11/24/2007  . Essential hypertension 11/24/2007    History obtained from: chart review and patient.  [name] is a 85 y.o. female presenting for a follow up visit. She was last seen in August 2018.  A G panel.  T that visit, we did testing to all of the foods and cucumber was  slightly reactive.  I was not sure that it was relevant.  We did get a shellfish panel to confirm this given the history of a positive reaction in the past.  We obtained labs to look for stinging insect allergies.  We also obtained a serum tryptase and environmental allergy panel.  The work-up was essentially normal.  We recommended continuing to take notes of reactions to look for possible triggers.  In the interim, she was admitted to the hospital overnight at the end of May 2020.  In the ED, she had a swollen tongue and started to have changes in her respiratory speech and a low oxygen saturation.  She received epinephrine,  steroids, and antihistamines with improvement.  She was admitted for observation.  She did receive some IV fluids as well as potassium replacement.  Tongue swelling resolved over the course of the stay.  Evidently, she had been taken doxycycline which was thought to be a possible trigger.  She was discharged on a prednisone taper.  She tells me that the swelling started in the afternoon and into the evening. It was not associated with the ingestion of any food at all. She felt that she started drooling and then her speech was having problems. She noticed a swollen tongue and she called her grandson who came in ten minutes to take her to the ED. She does have an EpiPen at home, but she let it expire. She had been on doxycyline prior to the episode, but she had already completed it before the tongue swelling episode.   She does report a tick bite one month ago. It was not embedded long at all. The tick bite occurred about 2-3 days before the ED visit and this is why she was placed on the doxycycline. She had never had doxycycline prior to this episode. This was the only tick bite that she could remember having. She does not need antibiotic routinely.   She continues to avoid the shellfish. She has not tried introducing this at home.   Otherwise, there have been no changes to her past medical history, surgical history, family history, or social history.    Review of Systems  Constitutional: Negative.  Negative for chills, fever, malaise/fatigue and weight loss.  HENT: Negative.  Negative for congestion, ear discharge, ear pain and sinus pain.   Eyes: Negative for pain, discharge and redness.  Respiratory: Negative for cough, sputum production, shortness of breath and wheezing.   Cardiovascular: Negative.  Negative for chest pain and palpitations.  Gastrointestinal: Negative for abdominal pain, heartburn, nausea and vomiting.  Skin: Negative.  Negative for itching and rash.  Neurological: Negative  for dizziness and headaches.  Endo/Heme/Allergies: Negative for environmental allergies. Does not bruise/bleed easily.       Objective:   Blood pressure 124/72, pulse 98, temperature 97.8 F (36.6 C), temperature source Temporal, resp. rate 18, SpO2 97 %. There is no height or weight on file to calculate BMI.   Physical Exam:  Physical Exam  Constitutional: She appears well-developed.  Very personable female.  HENT:  Head: Normocephalic and atraumatic.  Right Ear: Tympanic membrane, external ear and ear canal normal.  Left Ear: Tympanic membrane and ear canal normal.  Nose: No mucosal edema, rhinorrhea, nasal deformity or septal deviation. No epistaxis. Right sinus exhibits no maxillary sinus tenderness and no frontal sinus tenderness. Left sinus exhibits no maxillary sinus tenderness and no frontal sinus tenderness.  Mouth/Throat: Uvula is midline and oropharynx is clear and moist. Mucous membranes  are not pale and not dry.  Eyes: Pupils are equal, round, and reactive to light. Conjunctivae and EOM are normal. Right eye exhibits no chemosis and no discharge. Left eye exhibits no chemosis and no discharge. Right conjunctiva is not injected. Left conjunctiva is not injected.  No allergic shiners present.  Cardiovascular: Normal rate, regular rhythm and normal heart sounds.  Respiratory: Effort normal and breath sounds normal. No accessory muscle usage. No tachypnea. No respiratory distress. She has no wheezes. She has no rhonchi. She has no rales. She exhibits no tenderness.  Moving air well in all lung fields.  Lymphadenopathy:    She has no cervical adenopathy.  Neurological: She is alert.  Skin: No abrasion, no petechiae and no rash noted. Rash is not papular, not vesicular and not urticarial. No erythema. No pallor.  No urticarial or eczematous lesions noted.  Psychiatric: She has a normal mood and affect.     Diagnostic studies: none     Salvatore Marvel, MD  Allergy and  [facility]

## 2019-02-27 LAB — TRYPTASE: Tryptase: 10.6 ug/L (ref 2.2–13.2)

## 2019-02-27 LAB — ALLERGEN STINGING INSECT PANEL
Honeybee IgE: <0.1 kU/L
Hornet, White Face, IgE: <0.1 kU/L
Hornet, Yellow, IgE: <0.1 kU/L
Paper Wasp IgE: <0.1 kU/L
Yellow Jacket, IgE: <0.1 kU/L

## 2019-02-27 LAB — ALPHA-GAL PANEL
Alpha Gal IgE*: 4.64 kU/L — ABNORMAL HIGH (ref <0.10)
Beef (Bos spp) IgE: 0.45 kU/L — ABNORMAL HIGH (ref <0.35)
Class Interpretation: 0
Class Interpretation: 1
Lamb/Mutton (Ovis spp) IgE: 0.21 kU/L (ref <0.35)
Pork (Sus spp) IgE: <0.1 kU/L (ref <0.35)

## 2019-02-28 ENCOUNTER — Telehealth: Payer: Self-pay | Admitting: *Deleted

## 2019-02-28 NOTE — Telephone Encounter (Signed)
Dorene Sorrow called and wanted lab results for patient. Patient did give me verbal consent to discuss results with him since there is not a DPR on file. Spoke to [place] reviewed lab results with him and what patient needs to avoid as written in Dr Gillermina Hu note he verbalized understanding

## 2019-03-25 ENCOUNTER — Other Ambulatory Visit: Payer: Self-pay | Admitting: Family Medicine

## 2019-04-10 ENCOUNTER — Encounter (HOSPITAL_COMMUNITY): Payer: Self-pay | Admitting: *Deleted

## 2019-04-10 ENCOUNTER — Emergency Department (HOSPITAL_COMMUNITY)
Admission: EM | Admit: 2019-04-10 | Discharge: 2019-04-10 | Disposition: A | Discharge status: Home / Self Care | Payer: Medicare HMO | Attending: Emergency Medicine | Admitting: Emergency Medicine

## 2019-04-10 ENCOUNTER — Other Ambulatory Visit: Payer: Self-pay

## 2019-04-10 DIAGNOSIS — Z79899 Other long term (current) drug therapy: Secondary | ICD-10-CM | POA: Insufficient documentation

## 2019-04-10 DIAGNOSIS — Z7982 Long term (current) use of aspirin: Secondary | ICD-10-CM | POA: Diagnosis not present

## 2019-04-10 DIAGNOSIS — E039 Hypothyroidism, unspecified: Secondary | ICD-10-CM | POA: Diagnosis not present

## 2019-04-10 DIAGNOSIS — T7840XA Allergy, unspecified, initial encounter: Secondary | ICD-10-CM

## 2019-04-10 DIAGNOSIS — Z87891 Personal history of nicotine dependence: Secondary | ICD-10-CM | POA: Diagnosis not present

## 2019-04-10 DIAGNOSIS — I2510 Atherosclerotic heart disease of native coronary artery without angina pectoris: Secondary | ICD-10-CM | POA: Diagnosis not present

## 2019-04-10 DIAGNOSIS — I10 Essential (primary) hypertension: Secondary | ICD-10-CM | POA: Diagnosis not present

## 2019-04-10 MED ORDER — FAMOTIDINE IN NACL 20-0.9 MG/50ML-% IV SOLN
20.0000 mg | Freq: Once | INTRAVENOUS | Status: AC
  Administered 2019-04-10: 20 mg via INTRAVENOUS
  Filled 2019-04-10: qty 50

## 2019-04-10 MED ORDER — METHYLPREDNISOLONE SODIUM SUCC 125 MG IJ SOLR
125.0000 mg | Freq: Once | INTRAMUSCULAR | Status: AC
  Administered 2019-04-10: 125 mg via INTRAVENOUS
  Filled 2019-04-10: qty 2

## 2019-04-10 MED ORDER — DIPHENHYDRAMINE HCL 50 MG/ML IJ SOLN
25.0000 mg | Freq: Once | INTRAMUSCULAR | Status: AC
  Administered 2019-04-10: 25 mg via INTRAVENOUS
  Filled 2019-04-10: qty 1

## 2019-04-10 MED ORDER — PREDNISONE 20 MG PO TABS: ORAL_TABLET | ORAL | 0 refills | Status: DC

## 2019-04-10 MED ORDER — FAMOTIDINE 20 MG PO TABS: 20.0000 mg | ORAL_TABLET | Freq: Two times a day (BID) | ORAL | 0 refills | Status: DC

## 2019-04-10 NOTE — ED Triage Notes (Signed)
Pt with allergy to beef and ate a roast beef sandwich at 1400 today, pt did not take any benadryl.  Pt with diarrhea and throat feels scratchy now. Pt took diarrhea medicine.  Pt states it's hard to swallow.

## 2019-04-10 NOTE — Discharge Instructions (Addendum)
Take benadryl every 4-6 hours if needed for allergy reaction.   Follow up with your md

## 2019-04-10 NOTE — ED Provider Notes (Signed)
[facility] EMERGENCY DEPARTMENT Provider Note   CSN: [id number] Arrival date & time: 04/10/19  1729     History   Chief Complaint Chief Complaint  Patient presents with  . Allergic Reaction    HPI [name] is a 85 y.o. female.     Pt had meat today and she is allergic to it.  She feels like her throat is closing up  The history is provided by the patient. No language interpreter was used.  Allergic Reaction Presenting symptoms: no difficulty breathing and no rash   Severity:  Mild Prior allergic episodes:  Unable to specify Context: not animal exposure   Relieved by:  Nothing Worsened by:  Nothing Ineffective treatments:  None tried   Past Medical History:  Diagnosis Date  . Anemia   . Blood clot associated with vein wall inflammation    hx of in right leg  . CAD (coronary artery disease)   . Carpal tunnel syndrome on left    Left Hand pain with numbness, new onset   . Chronic back pain   . Chronic back pain    buldging disc  . Constipation   . Diarrhea   . Difficult intubation    small airway  . DJD (degenerative joint disease)    Severe  . Gastric ulcer    hx of   . GERD (gastroesophageal reflux disease)    takes Omeprazole daily  . Headache(784.0)    occasionally  . Hyperlipidemia    takes Lipitor nightly  . Hypertension    takes Amlodipine and Metoprolol daily  . Nocturia   . Obesity   . Pneumonia    hx of-in 2011  . Shortness of breath    with exertion  . Sleep apnea    sleep study done 33yrs ago  . Thyroid mass    right    Patient Active Problem List   Diagnosis Date Noted  . Hospital discharge follow-up 02/16/2019  . Angioedema 02/02/2019  . Tick bite 01/02/2019  . Fall 07/20/2017  . Phobia 05/17/2017  . Seafood allergy 01/31/2017  . Overweight (BMI 25.0-29.9) 10/12/2016  . CAD in native artery   . Osteoarthritis 04/17/2013  . Coronary vasospasm ([place]) 02/07/2013  . Hypothyroid 08/14/2012  . Prediabetes 10/10/2011  .  CARPAL TUNNEL SYNDROME, BILATERAL 02/19/2010  . GERD 09/15/2009  . Coronary atherosclerosis 10/01/2008  . DYSPEPSIA 07/07/2008  . Back pain of lumbar region with sciatica 07/03/2008  . Hyperlipidemia 11/24/2007  . Essential hypertension 11/24/2007    Past Surgical History:  Procedure Laterality Date  . ABDOMINAL HYSTERECTOMY    . APPENDECTOMY  1987  . Arthoscopy left knee  1992  . Arthroscopy left shoulder  1999  . Back  Surgery  lumbar  1989 /1999   x 2  . BACK SURGERY    . CARDIAC CATHETERIZATION  05/2010  . CARDIAC CATHETERIZATION N/A 09/30/2015   Procedure: Left Heart Cath and Coronary Angiography;  Surgeon: Troy Sine, MD;  Location: [place] [facility];  Service: Cardiovascular;  Laterality: N/A;  . Carpal tunnel release right     x 2  . CHOLECYSTECTOMY    . COLONOSCOPY    . ESOPHAGOGASTRODUODENOSCOPY    . LUMBAR FUSION  March 19, 2014  . NM MYOCAR PERF WALL MOTION  11/12/2008   no ischemia  . PARTIAL HYSTERECTOMY  1972  . ROTATOR CUFF REPAIR     right  . SPINE SURGERY    . THYROIDECTOMY  11/17/2011  Procedure: THYROIDECTOMY;  Surgeon: Ascencion Dike, MD;  Location: [place];  Service: ENT;  Laterality: Right;  WITH FROZEN SECTION  . TONSILLECTOMY       OB History   No obstetric history on file.      Home Medications    Prior to Admission medications   Medication Sig Start Date End Date Taking? Authorizing Provider  amLODipine (NORVASC) 10 MG tablet TAKE 1 TABLET(10 MG) BY MOUTH DAILY Patient taking differently: Take 10 mg by mouth every evening.  02/05/19  Yes Fayrene Helper, MD  Ascorbic Acid (VITAMIN C) 500 MG tablet Take 500 mg by mouth daily.    Yes [provider]  aspirin EC 81 MG tablet Take 81 mg by mouth daily.   Yes [provider]  atorvastatin (LIPITOR) 40 MG tablet TAKE 1 TABLET(40 MG) BY MOUTH DAILY AT 6 PM Patient taking differently: Take 40 mg by mouth every evening.  03/25/19  Yes Perlie Mayo, NP  calcium carbonate (OS-CAL -  DOSED IN MG OF ELEMENTAL CALCIUM) 1250 MG tablet Take 1 tablet by mouth daily.   Yes [provider]  Cyanocobalamin (B-12) 1000 MCG CAPS Take 1 capsule by mouth daily.   Yes [provider]  EPINEPHrine (EPIPEN 2-PAK) 0.3 mg/0.3 mL IJ SOAJ injection inject as directed for SEVERE ALLERGIC REACTIONS Patient taking differently: Inject 0.3 mg into the muscle See admin instructions. inject as directed for SEVERE ALLERGIC REACTIONS 02/03/19  Yes Johnson, Clanford L, MD  fluticasone (FLONASE) 50 MCG/ACT nasal spray SHAKE LIQUID AND USE 1 SPRAY IN EACH NOSTRIL DAILY Patient taking differently: Place 1 spray into both nostrils daily as needed for allergies.  03/25/19  Yes Perlie Mayo, NP  hydrochlorothiazide (MICROZIDE) 12.5 MG capsule TAKE 1 CAPSULE(12.5 MG) BY MOUTH DAILY Patient taking differently: Take 12.5 mg by mouth daily.  02/05/19  Yes Fayrene Helper, MD  HYDROcodone-acetaminophen (NORCO) 7.5-325 MG tablet Take 1 tablet by mouth daily as needed for moderate pain. Patient taking differently: Take 1 tablet by mouth 2 (two) times daily.  11/14/17  Yes Fayrene Helper, MD  isosorbide mononitrate (IMDUR) 30 MG 24 hr tablet Take 1 tablet (30 mg total) by mouth daily. 03/08/18  Yes Croitoru, Mihai, MD  levothyroxine (SYNTHROID) 25 MCG tablet TAKE 1 TABLET BY MOUTH EVERY MORNING Patient taking differently: Take 25 mcg by mouth every morning.  02/05/19  Yes Fayrene Helper, MD  Multiple Vitamin (MULITIVITAMIN WITH MINERALS) TABS Take 1 tablet by mouth daily.   Yes [provider]  nitroGLYCERIN (NITROLINGUAL) 0.4 MG/SPRAY spray Place 1 spray every 5 (five) minutes x 3 doses as needed under the tongue for chest pain. 07/20/17  Yes Croitoru, Mihai, MD  potassium gluconate 595 (99 K) MG TABS tablet Take 595 mg by mouth every evening.   Yes [provider]  diclofenac sodium (VOLTAREN) 1 % GEL Apply 2 g topically 2 (two) times daily as needed (for back pain). Patient  not taking: Reported on 04/10/2019 02/03/19   Murlean Iba, MD  famotidine (PEPCID) 20 MG tablet Take 1 tablet (20 mg total) by mouth 2 (two) times daily. 04/10/19   Milton Ferguson, MD  predniSONE (DELTASONE) 20 MG tablet 2 tabs po daily x 3 days 04/10/19   Milton Ferguson, MD    Family History Family History  Problem Relation Age of Onset  . Anuerysm Mother        died at 72  . Stroke Father  98 at death   . Anesthesia problems Neg Hx   . Hypotension Neg Hx   . Malignant hyperthermia Neg Hx   . Pseudochol deficiency Neg Hx   . Allergic rhinitis Neg Hx   . Angioedema Neg Hx   . Asthma Neg Hx   . Atopy Neg Hx   . Eczema Neg Hx   . Immunodeficiency Neg Hx   . Urticaria Neg Hx     Social History Social History   Tobacco Use  . Smoking status: Former Smoker    Types: Cigarettes    Quit date: 09/06/1984    Years since quitting: 34.6  . Smokeless tobacco: Never Used  Substance Use Topics  . Alcohol use: No    Comment: Casual  . Drug use: No     Allergies   Ace inhibitors, Beef-derived products, Bupropion hcl, Codeine, Penicillins, Shellfish allergy, Angiotensin receptor blockers, Bee venom, Lambs quarters, and Pork-derived products   Review of Systems Review of Systems  Constitutional: Negative for appetite change and fatigue.  HENT: Negative for congestion, ear discharge and sinus pressure.        Difficulty swallowing  Eyes: Negative for discharge.  Respiratory: Negative for cough.   Cardiovascular: Negative for chest pain.  Gastrointestinal: Negative for abdominal pain and diarrhea.  Genitourinary: Negative for frequency and hematuria.  Musculoskeletal: Negative for back pain.  Skin: Negative for rash.  Neurological: Negative for seizures and headaches.  Psychiatric/Behavioral: Negative for hallucinations.     Physical Exam Updated Vital Signs BP (!) 167/96   Pulse 75   Temp 98.5 F (36.9 C) (Oral)   Resp (!) 21   Ht 5\' 3"  (1.6 m)   Wt 69.4 kg    SpO2 98%   BMI 27.10 kg/m   Physical Exam Vitals signs and nursing note reviewed.  Constitutional:      Appearance: She is well-developed.  HENT:     Head: Normocephalic.     Nose: Nose normal.     Mouth/Throat:     Mouth: Mucous membranes are moist.     Comments: Normal pharynx Eyes:     General: No scleral icterus.    Conjunctiva/sclera: Conjunctivae normal.  Neck:     Musculoskeletal: Neck supple.     Thyroid: No thyromegaly.  Cardiovascular:     Rate and Rhythm: Normal rate and regular rhythm.     Heart sounds: No murmur. No friction rub. No gallop.   Pulmonary:     Breath sounds: No stridor. No wheezing or rales.  Chest:     Chest wall: No tenderness.  Abdominal:     General: There is no distension.     Tenderness: There is no abdominal tenderness. There is no rebound.  Musculoskeletal: Normal range of motion.  Lymphadenopathy:     Cervical: No cervical adenopathy.  Skin:    Findings: No erythema or rash.  Neurological:     Mental Status: She is oriented to person, place, and time.     Motor: No abnormal muscle tone.     Coordination: Coordination normal.  Psychiatric:        Behavior: Behavior normal.      ED Treatments / Results  Labs (all labs ordered are listed, but only abnormal results are displayed) Labs Reviewed - No data to display  EKG None  Radiology No results found.  Procedures Procedures (including critical care time)  Medications Ordered in ED Medications  diphenhydrAMINE (BENADRYL) injection 25 mg (25 mg Intravenous Given 04/10/19 1832)  famotidine (PEPCID) IVPB 20 mg premix (0 mg Intravenous Stopped 04/10/19 1937)  methylPREDNISolone sodium succinate (SOLU-MEDROL) 125 mg/2 mL injection 125 mg (125 mg Intravenous Given 04/10/19 1832)     Initial Impression / Assessment and Plan / ED Course  I have reviewed the triage vital signs and the nursing notes.  Pertinent labs & imaging results that were available during my care of the  patient were reviewed by me and considered in my medical decision making (see chart for details).        Pt with allergy symptoms.  Pt improved with tx.  rx prednisone and pepcid with benadryl prn Final Clinical Impressions(s) / ED Diagnoses   Final diagnoses:  Allergic reaction, initial encounter    ED Discharge Orders         Ordered    predniSONE (DELTASONE) 20 MG tablet     04/10/19 2217    famotidine (PEPCID) 20 MG tablet  2 times daily     04/10/19 2217           Milton Ferguson, MD 04/10/19 2222

## 2019-04-23 ENCOUNTER — Encounter: Payer: Self-pay | Admitting: Family Medicine

## 2019-04-23 ENCOUNTER — Other Ambulatory Visit: Payer: Self-pay

## 2019-04-23 ENCOUNTER — Ambulatory Visit (INDEPENDENT_AMBULATORY_CARE_PROVIDER_SITE_OTHER): Payer: Medicare HMO | Admitting: Family Medicine

## 2019-04-23 VITALS — BP 112/70 | HR 76 | Temp 97.5°F | Resp 15 | Ht 63.0 in | Wt 167.0 lb

## 2019-04-23 DIAGNOSIS — H5789 Other specified disorders of eye and adnexa: Secondary | ICD-10-CM

## 2019-04-23 DIAGNOSIS — M5440 Lumbago with sciatica, unspecified side: Secondary | ICD-10-CM | POA: Diagnosis not present

## 2019-04-23 DIAGNOSIS — I10 Essential (primary) hypertension: Secondary | ICD-10-CM | POA: Diagnosis not present

## 2019-04-23 NOTE — Progress Notes (Signed)
   [name]     MRN: [id number]      DOB: [birth year]   HPI [name] is here  With a 3 day h/o painless red eye, no vision change no drainage from eye, washed haitrr with shimmer light the day before and wonders if this s what aggravated the condition ROS Denies recent fever or chills. Denies sinus pressure, nasal congestion, ear pain or sore throat. Denies chest congestion, productive cough or wheezing. Denies chest pains, palpitations and leg swelling  Denies skin break down or rash.   PE  BP 112/70   Pulse 76   Temp (!) 97.5 F (36.4 C) (Temporal)   Resp 15   Ht 5\' 3"  (1.6 m)   Wt 167 lb (75.8 kg)   SpO2 98%   BMI 29.58 kg/m   Patient alert and oriented and in no cardiopulmonary distress.  HEENT: No facial asymmetry, EOMI,   oropharynx pink and moist.  Neck supple no JVD, no mass.red left eye, right eye normal. No loss of vision  Chest: Clear to auscultation bilaterally.  CVS: S1, S2 no murmurs, no S3.Regular rate.     Ext: No edema  MS: decreased  ROM spine, shoulders, hips and knees.  Skin: Intact, no ulcerations or rash noted.  Psych: Good eye contact, normal affect. Memory intact not anxious or depressed appearing.  CNS: CN 2-12 intact, power,  normal throughout.no focal deficits noted.   Assessment & Plan  Red eye 3 day h/o painless red eye, needs eye eval, clinical assessment is subconjunctival hemmorhage  Essential hypertension Controlled, no change in medication   Back pain of lumbar region with sciatica Adequate pin control, and fall precautions discussed briefly

## 2019-04-23 NOTE — Patient Instructions (Signed)
Fr/u as before, call if you need me sooner  Please try to sched eye appt as urgent before she leaves today  Blood pressure is excellent   Please if you develop pain or blurry vision you need to go to the ED  I expect that with time the redness will go away

## 2019-04-23 NOTE — Assessment & Plan Note (Addendum)
3 day h/o painless red eye, needs eye eval, clinical assessment is subconjunctival hemmorhage

## 2019-04-24 ENCOUNTER — Other Ambulatory Visit (HOSPITAL_COMMUNITY): Payer: Self-pay | Admitting: Physician Assistant

## 2019-04-24 ENCOUNTER — Telehealth: Payer: Self-pay

## 2019-04-24 ENCOUNTER — Ambulatory Visit (HOSPITAL_COMMUNITY)
Admission: RE | Admit: 2019-04-24 | Discharge: 2019-04-24 | Disposition: A | Discharge status: Home / Self Care | Payer: Medicare HMO | Source: Ambulatory Visit | Attending: Physician Assistant | Admitting: Physician Assistant

## 2019-04-24 ENCOUNTER — Other Ambulatory Visit: Payer: Self-pay | Admitting: Cardiovascular Disease

## 2019-04-24 DIAGNOSIS — M545 Low back pain, unspecified: Secondary | ICD-10-CM

## 2019-04-24 DIAGNOSIS — M47816 Spondylosis without myelopathy or radiculopathy, lumbar region: Secondary | ICD-10-CM

## 2019-04-24 NOTE — Telephone Encounter (Signed)
Please contact patient for a past due follow up.  Patient notified that she needs to make a follow up appointment with Dr. Sallyanne Kuster before anymore refills given.The patient understands and will contact [facility] office to schedule a follow up.

## 2019-04-29 NOTE — Assessment & Plan Note (Signed)
Controlled, no change in medication  

## 2019-04-29 NOTE — Assessment & Plan Note (Signed)
Adequate pin control, and fall precautions discussed briefly

## 2019-05-10 ENCOUNTER — Other Ambulatory Visit (HOSPITAL_COMMUNITY): Payer: Self-pay | Admitting: Pain Medicine

## 2019-05-10 ENCOUNTER — Other Ambulatory Visit: Payer: Self-pay | Admitting: Pain Medicine

## 2019-05-10 DIAGNOSIS — M47816 Spondylosis without myelopathy or radiculopathy, lumbar region: Secondary | ICD-10-CM

## 2019-05-16 ENCOUNTER — Ambulatory Visit (HOSPITAL_COMMUNITY): Payer: Medicare HMO

## 2019-05-21 ENCOUNTER — Other Ambulatory Visit: Payer: Self-pay

## 2019-05-21 ENCOUNTER — Encounter: Payer: Self-pay | Admitting: Family Medicine

## 2019-05-21 ENCOUNTER — Encounter (INDEPENDENT_AMBULATORY_CARE_PROVIDER_SITE_OTHER): Payer: Self-pay

## 2019-05-21 ENCOUNTER — Ambulatory Visit (INDEPENDENT_AMBULATORY_CARE_PROVIDER_SITE_OTHER): Payer: Medicare HMO | Admitting: Family Medicine

## 2019-05-21 VITALS — BP 130/80 | HR 81 | Temp 97.6°F | Ht 63.0 in | Wt 158.0 lb

## 2019-05-21 DIAGNOSIS — I10 Essential (primary) hypertension: Secondary | ICD-10-CM

## 2019-05-21 DIAGNOSIS — E7800 Pure hypercholesterolemia, unspecified: Secondary | ICD-10-CM

## 2019-05-21 DIAGNOSIS — Z0000 Encounter for general adult medical examination without abnormal findings: Secondary | ICD-10-CM

## 2019-05-21 NOTE — Assessment & Plan Note (Deleted)
After obtaining informed consent, the vaccine is  administered , with no adverse effect noted at the time of administration.  

## 2019-05-21 NOTE — Progress Notes (Signed)
    [name]     MRN: [id number]      DOB: 11-05-1933  HPI: Patient is in for annual physical exam. No other health concerns are expressed or addressed at the visit.c/o increased stress in past 2 weeks due to increased dependence of her niece on her for emotional support, which is wearing her down C/o increased back pain , has upocoming imaging   Immunization is reviewed , flu vaccine should have been administered at visit, however pt will return   PE: BP 130/80   Pulse 81   Temp 97.6 F (36.4 C) (Temporal)   Ht 5\' 3"  (1.6 m)   Wt 158 lb (71.7 kg)   SpO2 98%   BMI 27.99 kg/m   Pleasant  female, alert and oriented x 3, in no cardio-pulmonary distress. Afebrile. HEENT No facial trauma or asymetry. Sinuses non tender.  Extra occullar muscles intact, Neck: supple, no adenopathy,JVD or thyromegaly.No bruits.  Chest: Clear to ascultation bilaterally.No crackles or wheezes. Non tender to palpation    Cardiovascular system; Heart sounds normal,  S1 and  S2 ,no S3.  No murmur, or thrill. Apical beat not displaced Peripheral pulses normal.  Abdomen: Soft, non tender, no organomegaly or masses. No bruits. Bowel sounds normal. No guarding, tenderness or rebound.    Musculoskeletal exam: Decreased  ROM of spine, hips , shoulders and knees.  deformity ,and  crepitus noted.  muscle wasting and  atrophy.   Neurologic: Cranial nerves 2 to 12 intact. Power, normal throughout. Disturbance in gait. No tremor.  Skin: Intact, no ulceration, erythema , scaling or rash noted. Pigmentation normal throughout  Psych; Normal mood and affect. Judgement and concentration normal   Assessment & Plan:  Annual physical exam Annual exam as documented. Counseling done  re healthy lifestyle involving commitment to 150 minutes exercise per week, heart healthy diet, and attaining healthy weight.The importance of adequate sleep also discussed. Regular seat belt use and home  safety, is also discussed. Changes in health habits are decided on by the patient with goals and time frames  set for achieving them. Immunization and cancer screening needs are specifically addressed at this visit.

## 2019-05-21 NOTE — Patient Instructions (Addendum)
F/U in 4 months, call if you need me before   Please get fasting lipid, cmp and EGFR  First week  In October  Flu vaccine to be done in the future  You need to have an honest discussion regarding your need for your own space and how anxious and stressed you currently feel with this recent change at home.  Need to stop current stress NOW  Thanks for choosing [facility], we consider it a privelige to serve you.

## 2019-05-21 NOTE — Assessment & Plan Note (Signed)

## 2019-05-22 ENCOUNTER — Ambulatory Visit (HOSPITAL_COMMUNITY)
Admission: RE | Admit: 2019-05-22 | Discharge: 2019-05-22 | Disposition: A | Discharge status: Home / Self Care | Payer: Medicare HMO | Source: Ambulatory Visit | Attending: Pain Medicine | Admitting: Pain Medicine

## 2019-05-22 DIAGNOSIS — M47816 Spondylosis without myelopathy or radiculopathy, lumbar region: Secondary | ICD-10-CM | POA: Diagnosis present

## 2019-05-24 ENCOUNTER — Other Ambulatory Visit: Payer: Self-pay

## 2019-05-24 ENCOUNTER — Ambulatory Visit (HOSPITAL_COMMUNITY): Payer: Medicare HMO

## 2019-05-24 MED ORDER — ISOSORBIDE MONONITRATE ER 30 MG PO TB24: ORAL_TABLET | ORAL | 0 refills | Status: DC

## 2019-05-28 ENCOUNTER — Other Ambulatory Visit: Payer: Self-pay

## 2019-05-28 ENCOUNTER — Ambulatory Visit (INDEPENDENT_AMBULATORY_CARE_PROVIDER_SITE_OTHER): Payer: Medicare HMO

## 2019-05-28 DIAGNOSIS — Z23 Encounter for immunization: Secondary | ICD-10-CM | POA: Diagnosis not present

## 2019-06-23 LAB — COMPLETE METABOLIC PANEL WITH GFR
AG Ratio: 1.6 (calc) (ref 1.0–2.5)
ALT: 14 U/L (ref 6–29)
AST: 21 U/L (ref 10–35)
Albumin: 4.2 g/dL (ref 3.6–5.1)
Alkaline phosphatase (APISO): 100 U/L (ref 37–153)
BUN/Creatinine Ratio: 19 (calc) (ref 6–22)
BUN: 19 mg/dL (ref 7–25)
CO2: 32 mmol/L (ref 20–32)
Calcium: 9.5 mg/dL (ref 8.6–10.4)
Chloride: 97 mmol/L — ABNORMAL LOW (ref 98–110)
Creat: 1.02 mg/dL — ABNORMAL HIGH (ref 0.60–0.88)
GFR, Est African American: 59 mL/min/{1.73_m2} — ABNORMAL LOW (ref >=60)
GFR, Est Non African American: 51 mL/min/{1.73_m2} — ABNORMAL LOW (ref >=60)
Globulin: 2.6 g/dL (calc) (ref 1.9–3.7)
Glucose, Bld: 98 mg/dL (ref 65–99)
Potassium: 3.5 mmol/L (ref 3.5–5.3)
Sodium: 138 mmol/L (ref 135–146)
Total Bilirubin: 0.6 mg/dL (ref 0.2–1.2)
Total Protein: 6.8 g/dL (ref 6.1–8.1)

## 2019-06-23 LAB — LIPID PANEL
Cholesterol: 174 mg/dL (ref <200)
HDL: 44 mg/dL — ABNORMAL LOW (ref >=50)
LDL Cholesterol (Calc): 101 mg/dL (calc) — ABNORMAL HIGH
Non-HDL Cholesterol (Calc): 130 mg/dL (calc) — ABNORMAL HIGH (ref <130)
Total CHOL/HDL Ratio: 4 (calc) (ref <5.0)
Triglycerides: 173 mg/dL — ABNORMAL HIGH (ref <150)

## 2019-06-24 ENCOUNTER — Encounter

## 2019-06-28 ENCOUNTER — Ambulatory Visit (INDEPENDENT_AMBULATORY_CARE_PROVIDER_SITE_OTHER): Payer: Medicare HMO | Admitting: Cardiovascular Disease

## 2019-06-28 ENCOUNTER — Encounter: Payer: Self-pay | Admitting: Cardiovascular Disease

## 2019-06-28 ENCOUNTER — Other Ambulatory Visit: Payer: Self-pay

## 2019-06-28 VITALS — BP 126/82 | HR 81 | Ht 60.0 in | Wt 161.0 lb

## 2019-06-28 DIAGNOSIS — I10 Essential (primary) hypertension: Secondary | ICD-10-CM

## 2019-06-28 DIAGNOSIS — E669 Obesity, unspecified: Secondary | ICD-10-CM | POA: Diagnosis not present

## 2019-06-28 DIAGNOSIS — I201 Angina pectoris with documented spasm: Secondary | ICD-10-CM

## 2019-06-28 DIAGNOSIS — Z91018 Allergy to other foods: Secondary | ICD-10-CM

## 2019-06-28 DIAGNOSIS — I25118 Atherosclerotic heart disease of native coronary artery with other forms of angina pectoris: Secondary | ICD-10-CM

## 2019-06-28 DIAGNOSIS — E7800 Pure hypercholesterolemia, unspecified: Secondary | ICD-10-CM

## 2019-06-28 NOTE — Patient Instructions (Signed)

## 2019-06-28 NOTE — Progress Notes (Signed)
Patient ID: [name], female   DOB: 27-Jan-1934, 85 y.o.   MRN: [id number]     Cardiology Office Note    Date:  06/28/2019   ID:  [name], DOB [birth year], MRN [id number]  PCP:  Fayrene Helper, MD  Cardiologist:   Sanda Klein, MD   No chief complaint on file.   History of Present Illness:  [name] is a 85 y.o. female with intermittent chest discomfort suggestive of angina pectoris, mild scattered CAD by coronary angiography in 2017, possible coronary spasm, hyperlipidemia (on atorvastatin), borderline diabetes mellitus, obesity and hypertension, quit smoking 30 years ago.  After a tick bite in March of this year, she developed alpha-gal sensitization and has changed her diet.  She has very infrequent episodes of angina pectoris that occurs only with greater than usual physical exertion and resolves promptly with rest.  The patient specifically denies any chest pain at rest, dyspnea at rest or with exertion, orthopnea, paroxysmal nocturnal dyspnea, syncope, palpitations, focal neurological deficits, intermittent claudication, lower extremity edema, unexplained weight gain, cough, hemoptysis or wheezing.    Past Medical History:  Diagnosis Date  . Anemia   . Blood clot associated with vein wall inflammation    hx of in right leg  . CAD (coronary artery disease)   . Carpal tunnel syndrome on left    Left Hand pain with numbness, new onset   . Chronic back pain   . Chronic back pain    buldging disc  . Constipation   . Diarrhea   . Difficult intubation    small airway  . DJD (degenerative joint disease)    Severe  . Gastric ulcer    hx of   . GERD (gastroesophageal reflux disease)    takes Omeprazole daily  . Headache(784.0)    occasionally  . Hyperlipidemia    takes Lipitor nightly  . Hypertension    takes Amlodipine and Metoprolol daily  . Nocturia   . Obesity   . Pneumonia    hx of-in 2011  . Shortness of breath    with exertion   . Sleep apnea    sleep study done 75yrs ago  . Thyroid mass    right    Past Surgical History:  Procedure Laterality Date  . ABDOMINAL HYSTERECTOMY    . APPENDECTOMY  1987  . Arthoscopy left knee  1992  . Arthroscopy left shoulder  1999  . Back  Surgery  lumbar  1989 /1999   x 2  . BACK SURGERY    . CARDIAC CATHETERIZATION  05/2010  . CARDIAC CATHETERIZATION N/A 09/30/2015   Procedure: Left Heart Cath and Coronary Angiography;  Surgeon: Troy Sine, MD;  Location: [facility];  Service: Cardiovascular;  Laterality: N/A;  . Carpal tunnel release right     x 2  . CHOLECYSTECTOMY    . COLONOSCOPY    . ESOPHAGOGASTRODUODENOSCOPY    . LUMBAR FUSION  March 19, 2014  . NM MYOCAR PERF WALL MOTION  11/12/2008   no ischemia  . PARTIAL HYSTERECTOMY  1972  . ROTATOR CUFF REPAIR     right  . SPINE SURGERY    . THYROIDECTOMY  11/17/2011   Procedure: THYROIDECTOMY;  Surgeon: Ascencion Dike, MD;  Location: [facility];  Service: ENT;  Laterality: Right;  WITH FROZEN SECTION  . TONSILLECTOMY      Outpatient Medications Prior to Visit  Medication Sig Dispense Refill  . amLODipine (NORVASC) 10 MG tablet TAKE 1  TABLET(10 MG) BY MOUTH DAILY (Patient taking differently: Take 10 mg by mouth every evening. ) 90 tablet 3  . Ascorbic Acid (VITAMIN C) 500 MG tablet Take 500 mg by mouth daily.     Marland Kitchen aspirin EC 81 MG tablet Take 81 mg by mouth daily.    Marland Kitchen atorvastatin (LIPITOR) 40 MG tablet TAKE 1 TABLET(40 MG) BY MOUTH DAILY AT 6 PM (Patient taking differently: Take 40 mg by mouth every evening. ) 90 tablet 3  . calcium carbonate (OS-CAL - DOSED IN MG OF ELEMENTAL CALCIUM) 1250 MG tablet Take 1 tablet by mouth daily.    . Cyanocobalamin (B-12) 1000 MCG CAPS Take 1 capsule by mouth daily.    . diclofenac sodium (VOLTAREN) 1 % GEL Apply 2 g topically 2 (two) times daily as needed (for back pain).    Marland Kitchen EPINEPHrine (EPIPEN 2-PAK) 0.3 mg/0.3 mL IJ SOAJ injection inject as directed for SEVERE ALLERGIC REACTIONS  (Patient taking differently: Inject 0.3 mg into the muscle See admin instructions. inject as directed for SEVERE ALLERGIC REACTIONS) 2 Device 1  . famotidine (PEPCID) 20 MG tablet Take 1 tablet (20 mg total) by mouth 2 (two) times daily. 10 tablet 0  . fluticasone (FLONASE) 50 MCG/ACT nasal spray SHAKE LIQUID AND USE 1 SPRAY IN EACH NOSTRIL DAILY (Patient taking differently: Place 1 spray into both nostrils daily as needed for allergies. ) 16 g 3  . hydrochlorothiazide (MICROZIDE) 12.5 MG capsule TAKE 1 CAPSULE(12.5 MG) BY MOUTH DAILY (Patient taking differently: Take 12.5 mg by mouth daily. ) 30 capsule 6  . HYDROcodone-acetaminophen (NORCO) 7.5-325 MG tablet Take 1 tablet by mouth daily as needed for moderate pain. (Patient taking differently: Take 1 tablet by mouth 2 (two) times daily. ) 30 tablet 0  . isosorbide mononitrate (IMDUR) 30 MG 24 hr tablet TAKE 1 TABLET(30 MG) BY MOUTH DAILY 30 tablet 0  . levothyroxine (SYNTHROID) 25 MCG tablet TAKE 1 TABLET BY MOUTH EVERY MORNING (Patient taking differently: Take 25 mcg by mouth every morning. ) 90 tablet 1  . Multiple Vitamin (MULITIVITAMIN WITH MINERALS) TABS Take 1 tablet by mouth daily.    . nitroGLYCERIN (NITROLINGUAL) 0.4 MG/SPRAY spray Place 1 spray every 5 (five) minutes x 3 doses as needed under the tongue for chest pain. 12 g 1  . potassium gluconate 595 (99 K) MG TABS tablet Take 595 mg by mouth every evening.     No facility-administered medications prior to visit.      Allergies:   Ace inhibitors, Beef-derived products, Bupropion hcl, Codeine, Penicillins, Shellfish allergy, Angiotensin receptor blockers, Bee venom, Lambs quarters, and Pork-derived products   Social History   Socioeconomic History  . Marital status: Widowed    Spouse name: Not on file  . Number of children: 4  . Years of education: Not on file  . Highest education level: Not on file  Occupational History    Employer: RETIRED  Social Needs  . Financial resource  strain: Not hard at all  . Food insecurity    Worry: Never true    Inability: Never true  . Transportation needs    Medical: No    Non-medical: No  Tobacco Use  . Smoking status: Former Smoker    Types: Cigarettes    Quit date: 09/06/1984    Years since quitting: 34.8  . Smokeless tobacco: Never Used  Substance and Sexual Activity  . Alcohol use: No    Comment: Casual  . Drug use: No  .  Sexual activity: Never    Birth control/protection: Surgical  Lifestyle  . Physical activity    Days per week: 0 days    Minutes per session: 0 min  . Stress: Not at all  Relationships  . Social connections    Talks on phone: More than three times a week    Gets together: Twice a week    Attends religious service: More than 4 times per year    Active member of club or organization: No    Attends meetings of clubs or organizations: Never    Relationship status: Divorced  Other Topics Concern  . Not on file  Social History Narrative  . Not on file     Family History:  The patient's family history includes Anuerysm in her mother; Stroke in her father.   ROS:   Please see the history of present illness.    ROS All other systems reviewed are negative  PHYSICAL EXAM:   VS:  BP 126/82   Pulse 81   Ht 5' (1.524 m)   Wt 161 lb (73 kg)   SpO2 98%   BMI 31.44 kg/m     General: Alert, oriented x3, no distress, mildly obese Head: no evidence of trauma, PERRL, EOMI, no exophtalmos or lid lag, no myxedema, no xanthelasma; normal ears, nose and oropharynx Neck: normal jugular venous pulsations and no hepatojugular reflux; brisk carotid pulses without delay and no carotid bruits Chest: clear to auscultation, no signs of consolidation by percussion or palpation, normal fremitus, symmetrical and full respiratory excursions Cardiovascular: normal position and quality of the apical impulse, regular rhythm, normal first and second heart sounds, no murmurs, rubs or gallops Abdomen: no tenderness or  distention, no masses by palpation, no abnormal pulsatility or arterial bruits, normal bowel sounds, no hepatosplenomegaly Extremities: no clubbing, cyanosis or edema; 2+ radial, ulnar and brachial pulses bilaterally; 2+ right femoral, posterior tibial and dorsalis pedis pulses; 2+ left femoral, posterior tibial and dorsalis pedis pulses; no subclavian or femoral bruits Neurological: grossly nonfocal Psych: Normal mood and affect   Wt Readings from Last 3 Encounters:  06/28/19 161 lb (73 kg)  05/21/19 158 lb (71.7 kg)  04/23/19 167 lb (75.8 kg)      Studies/Labs Reviewed:   EKG:  EKG is ordered today.  It shows normal sinus rhythm, normal tracing, QTC 448 ms  Recent Labs: 10/03/2018: TSH 3.59 02/03/2019: Hemoglobin 12.3; Platelets 255 06/22/2019: ALT 14; BUN 19; Creat 1.02; Potassium 3.5; Sodium 138   Lipid Panel    Component Value Date/Time   CHOL 174 06/22/2019 1452   TRIG 173 (H) 06/22/2019 1452   HDL 44 (L) 06/22/2019 1452   CHOLHDL 4.0 06/22/2019 1452   VLDL 34 (H) 05/03/2017 1047   LDLCALC 101 (H) 06/22/2019 1452    ASSESSMENT:    1. Atherosclerosis of native coronary artery of native heart with other form of angina pectoris ([place])   2. Vasospastic angina ([place])   3. Mild obesity   4. Pure hypercholesterolemia   5. Essential hypertension   6. Allergy to alpha-gal      PLAN:  In order of problems listed above:  1. Coronary atherosclerosis: There were no severe stenoses at cardiac catheterization.  The focus is on risk factor modification.  I do we will get her LDL cholesterol less than 70, but will defer making any changes to her medications until the situation with mammalian meat allergy is over, to avoid confusion regarding her symptoms. 2. Possible vasospastic angina:  Seems to be well controlled on a combination of long-acting nitrates and amlodipine.. 3. Obesity: Remains mildly obese, no evidence of diabetes by labs earlier this year. 4. HTN: Excellent control.   Use calcium channel blockers preferentially due to the suspicion for coronary vasospasm 5. HLP: Atorvastatin dose was increased to 40 mg after her last lipid profile. Target LDL under 70.   medicines are reviewed at length with the patient today.  Concerns regarding medicines are outlined above.  Medication changes, Labs and Tests ordered today are listed in the Patient Instructions below. Patient Instructions  Medication Instructions:  No changes *If you need a refill on your cardiac medications before your next appointment, please call your pharmacy*  Lab Work: None ordered If you have labs (blood work) drawn today and your tests are completely normal, you will receive your results only by: Marland Kitchen MyChart Message (if you have MyChart) OR . A paper copy in the mail If you have any lab test that is abnormal or we need to change your treatment, we will call you to review the results.  Testing/Procedures: None ordered  Follow-Up: At [facility], you and your health needs are our priority.  As part of our continuing mission to provide you with exceptional heart care, we have created designated Provider Care Teams.  These Care Teams include your primary Cardiologist (physician) and Advanced Practice Providers (APPs -  Physician Assistants and Nurse Practitioners) who all work together to provide you with the care you need, when you need it.  Your next appointment:   12 months  The format for your next appointment:   In Person  Provider:   Sanda Klein, MD        Signed, Sanda Klein, MD  06/28/2019 1:42 PM    [facility] [place], [place], [place]  [address] Phone: [phone]; Fax: [phone]

## 2019-07-30 ENCOUNTER — Telehealth: Payer: Self-pay | Admitting: *Deleted

## 2019-07-30 NOTE — Telephone Encounter (Signed)
FMLA received via fax for daughter Silva Bandy to take care of her mother Copied noted faxed

## 2019-08-07 DIAGNOSIS — I10 Essential (primary) hypertension: Secondary | ICD-10-CM

## 2019-09-04 ENCOUNTER — Other Ambulatory Visit: Payer: Self-pay

## 2019-09-04 ENCOUNTER — Other Ambulatory Visit: Payer: Self-pay | Admitting: *Deleted

## 2019-09-04 MED ORDER — ISOSORBIDE MONONITRATE ER 30 MG PO TB24: 30.0000 mg | ORAL_TABLET | Freq: Every day | ORAL | 3 refills | Status: DC

## 2019-09-04 MED ORDER — ISOSORBIDE MONONITRATE ER 30 MG PO TB24: ORAL_TABLET | ORAL | 0 refills | Status: DC

## 2019-09-05 ENCOUNTER — Ambulatory Visit (INDEPENDENT_AMBULATORY_CARE_PROVIDER_SITE_OTHER): Payer: Medicare HMO | Admitting: Allergy & Immunology

## 2019-09-05 ENCOUNTER — Encounter: Payer: Self-pay | Admitting: Allergy & Immunology

## 2019-09-05 ENCOUNTER — Other Ambulatory Visit: Payer: Self-pay

## 2019-09-05 VITALS — BP 138/86 | HR 88 | Temp 98.1°F | Resp 18 | Ht 60.0 in | Wt 159.8 lb

## 2019-09-05 DIAGNOSIS — J4520 Mild intermittent asthma, uncomplicated: Secondary | ICD-10-CM

## 2019-09-05 DIAGNOSIS — T7800XD Anaphylactic reaction due to unspecified food, subsequent encounter: Secondary | ICD-10-CM | POA: Diagnosis not present

## 2019-09-05 NOTE — Patient Instructions (Addendum)
1. Anaphylactic shock - alpha gal (mammalian meats/cow's milk) - Schedule an appointment for a shrimp challenge so we can get that back into your diet.  - Continue to avoid red meat and cow's milk to prevent any of these allergic reactions. - EpiPen is up to date.  2. Return in about 4 weeks (around 10/03/2019) for [place]. This can be an in-person, a virtual Webex or a telephone follow up visit.   Please inform us of any Emergency Department visits, hospitalizations, or changes in symptoms. Call us before going to the ED for breathing or allergy symptoms since we might be able to fit you in for a sick visit. Feel free to contact us anytime with any questions, problems, or concerns.  It was a pleasure to see you and your family again today!  Websites that have reliable patient information: 1. American Academy of Asthma, Allergy, and Immunology: www.aaaai.org 2. Food Allergy Research and Education (FARE): foodallergy.org 3. Mothers of Asthmatics: http://www.asthmacommunitynetwork.org 4. American College of Allergy, Asthma, and Immunology: www.acaai.org  "Like" [place] on Facebook and Instagram for our latest updates!        Make sure you are registered to vote! If you have moved or changed any of your contact information, you will need to get this updated before voting!  In some cases, you MAY be able to register to vote online: [email or web address]

## 2019-09-05 NOTE — Progress Notes (Signed)
FOLLOW UP  Date of Service/Encounter:  09/05/19   Assessment:   Alpha gal syndrome  History of multiple food allergies (including seafood), but with negative testing  Chronic rhinitis   [name] presents for a follow-up visit.  She did test positive for alpha gal during her work-up in June.  Unfortunately since that time, while she has continued to avoid red meat, she has been ingesting cows milk-containing products including butter as well as cheese.  I think this is likely the reason for her continued reactions.  Her reactions have certainly been less severe than the initial 1 for which I evaluated her.  However, her son reports that she is not as careful as she could be with avoidance of cows milk products.  While I rarely recommend avoidance of cows milk with alpha gal patients, I think it would be a good idea given her continued reactions despite avoidance of red meat.  We are going to get frequent updates on her condition from her son, whose 2 sons come routinely for allergen immunotherapy injections.  She would like to introduce shrimp again.  She has had skin testing as well as blood testing which has been negative.  She does not feel comfortable introducing this at home, so we are going to schedule her for a shrimp challenge so that she can safely eat shrimp at home.  An iodine allergy is more of an urban legend.  Iodine is an element and therefore not large enough to trigger an immune response.  The connection between shellfish and iodine has been debunked since it became very popular eyes decades ago.  I did spend some time explaining this to her today.   Plan/Recommendations:   1. Anaphylactic shock - alpha gal (mammalian meats/cow's milk) - Schedule an appointment for a shrimp challenge so we can get that back into your diet.  - Continue to avoid red meat and cow's milk to prevent any of these allergic reactions. - EpiPen is up to date.  2. Return in about 4 weeks  (around 10/03/2019) for [place]. This can be an in-person, a virtual Webex or a telephone follow up visit.    Subjective:   [name] is a 85 y.o. female presenting today for follow up of  Chief Complaint  Patient presents with  . Food Intolerance    Stomach gets upset with some foods    [name] has a history of the following: Patient Active Problem List   Diagnosis Date Noted  . Red eye 04/23/2019  . Angioedema 02/02/2019  . Fall 07/20/2017  . Phobia 05/17/2017  . Annual physical exam 04/29/2017  . Seafood allergy 01/31/2017  . Overweight (BMI 25.0-29.9) 10/12/2016  . CAD in native artery   . Osteoarthritis 04/17/2013  . Coronary vasospasm ([place]) 02/07/2013  . Hypothyroid 08/14/2012  . Prediabetes 10/10/2011  . CARPAL TUNNEL SYNDROME, BILATERAL 02/19/2010  . GERD 09/15/2009  . Coronary atherosclerosis 10/01/2008  . DYSPEPSIA 07/07/2008  . Back pain of lumbar region with sciatica 07/03/2008  . Hyperlipidemia 11/24/2007  . Essential hypertension 11/24/2007    History obtained from: chart review and patient.  [name] is a 85 y.o. female presenting for a follow up visit. She was last seen in June 2020 for an evaluation of another episode of anaphylaxis. At that time, we did additional lab work that demonstrated an elevated IgE to alpha gal of 4.64. We recommended avoidance of red meat. Tryptase and stinging insect panel were all negative.  She was in the emergency department in August due to a reaction caused by something she knowingly ate.  In the ER, she received diphenhydramine, methylprednisolone, and famotidine prior to discharge.  She was reporting her throat closing up. Her son reports that she had eaten some cheese or another cows milk-containing product.  Her son has tried to convince her to get cows milk out of her diet.  She has now drinking almond milk without any problems.  She does occasionally sneak some butter or cheese.   She is avoiding  all mammalian meat.  She eats a lot of chicken and [place].  She has not introduced shellfish because she is worried about a "iodine allergy".  Apparently she was tested several years ago someplace in [place] (not our clinic) where she was diagnosed with an iodine allergy.  She has avoided shellfish since that time.  Otherwise, there have been no changes to her past medical history, surgical history, family history, or social history.    Review of Systems  Constitutional: Negative.  Negative for fever, malaise/fatigue and weight loss.  HENT: Negative.  Negative for congestion, ear discharge, ear pain, sinus pain and sore throat.   Eyes: Negative for pain, discharge and redness.  Respiratory: Negative for cough, sputum production, shortness of breath and wheezing.   Cardiovascular: Negative.  Negative for chest pain and palpitations.  Gastrointestinal: Negative for abdominal pain, constipation, diarrhea, heartburn, nausea and vomiting.  Skin: Negative.  Negative for itching and rash.  Neurological: Negative for dizziness and headaches.  Endo/Heme/Allergies: Negative for environmental allergies. Does not bruise/bleed easily.       Objective:   Blood pressure 138/86, pulse 88, temperature 98.1 F (36.7 C), temperature source Temporal, resp. rate 18, height 5' (1.524 m), weight 159 lb 12.8 oz (72.5 kg), SpO2 96 %. Body mass index is 31.21 kg/m.   Physical Exam:  Physical Exam  Constitutional: She appears well-developed.  Pleasant female.  Rolling her eyes at her son, who accompanies her today.   HENT:  Head: Normocephalic and atraumatic.  Right Ear: Tympanic membrane, external ear and ear canal normal.  Left Ear: Tympanic membrane, external ear and ear canal normal.  Nose: Mucosal edema and rhinorrhea present. No nasal deformity or septal deviation. No epistaxis. Right sinus exhibits no maxillary sinus tenderness and no frontal sinus tenderness. Left sinus exhibits no maxillary  sinus tenderness and no frontal sinus tenderness.  Mouth/Throat: Uvula is midline and oropharynx is clear and moist. Mucous membranes are not pale and not dry.  No cobblestoning appreciated.  Eyes: Pupils are equal, round, and reactive to light. Conjunctivae and EOM are normal. Right eye exhibits no chemosis and no discharge. Left eye exhibits no chemosis and no discharge. Right conjunctiva is not injected. Left conjunctiva is not injected.  Cardiovascular: Normal rate, regular rhythm and normal heart sounds.  Respiratory: Effort normal and breath sounds normal. No accessory muscle usage. No tachypnea. No respiratory distress. She has no wheezes. She has no rhonchi. She has no rales. She exhibits no tenderness.  Moving air well in all lung fields.   Lymphadenopathy:    She has no cervical adenopathy.  Neurological: She is alert.  Skin: No abrasion, no petechiae and no rash noted. Rash is not papular, not vesicular and not urticarial. No erythema. No pallor.  No periorbital edema noted.  Psychiatric: She has a normal mood and affect.     Diagnostic studies: none      Salvatore Marvel, MD  Allergy and Asthma  [facility]

## 2019-09-18 ENCOUNTER — Telehealth: Payer: Self-pay | Admitting: *Deleted

## 2019-09-18 NOTE — Telephone Encounter (Signed)
[name] called about pt was wanting to know if it would be safe for pt to take covid vaccine due to the fact that she has stent in. They would like a call back as soon as possible.

## 2019-09-18 NOTE — Telephone Encounter (Signed)
Spoke with [name] and advised him the vaccine is recommended with verbal understanding.

## 2019-09-20 ENCOUNTER — Telehealth: Payer: Self-pay | Admitting: *Deleted

## 2019-09-20 NOTE — Telephone Encounter (Signed)
Pt got a call saying that she should not take the covid vaccine because she uses the epipen so she wanted to check with Dr Moshe Cipro before she took it

## 2019-09-21 ENCOUNTER — Other Ambulatory Visit: Payer: Self-pay | Admitting: Family Medicine

## 2019-09-24 NOTE — Telephone Encounter (Signed)
Patient aware.

## 2019-09-24 NOTE — Telephone Encounter (Signed)
SHE HAS HAD MULTIPLE Severe ALLERGIC REACTIONS REQUIRING HOSPITALIZATION SO I AM WARY OF RECOMMENDING IT FOR HER AT THIS TIME, DOES SHE HAVE AN ALLERGIST, I THINK SHE HAS SEEN ONE, I RECOMMEND SHE REACH OUT TO HER ALLERGIST FOR SPECIFIC RECOMMENDATION FOR HER

## 2019-09-25 ENCOUNTER — Ambulatory Visit: Payer: Medicare HMO | Admitting: Family Medicine

## 2019-09-25 ENCOUNTER — Telehealth: Payer: Self-pay

## 2019-09-25 DIAGNOSIS — T7800XD Anaphylactic reaction due to unspecified food, subsequent encounter: Secondary | ICD-10-CM

## 2019-09-25 NOTE — Telephone Encounter (Signed)
Patients grandson called stating Dr Moshe Cipro (Patients PCP) would like her to be tested for alpha gal before doing the COVID Vaccine. I see where we did labs last year for Alpha Gal. I am not to sure if that is what he is referring to.  Please Advise

## 2019-09-26 ENCOUNTER — Other Ambulatory Visit: Payer: Self-pay

## 2019-09-26 ENCOUNTER — Encounter: Payer: Self-pay | Admitting: Family Medicine

## 2019-09-26 ENCOUNTER — Ambulatory Visit (INDEPENDENT_AMBULATORY_CARE_PROVIDER_SITE_OTHER): Payer: Medicare HMO | Admitting: Family Medicine

## 2019-09-26 VITALS — BP 138/86 | Ht 60.0 in | Wt 159.0 lb

## 2019-09-26 DIAGNOSIS — E039 Hypothyroidism, unspecified: Secondary | ICD-10-CM

## 2019-09-26 DIAGNOSIS — E7800 Pure hypercholesterolemia, unspecified: Secondary | ICD-10-CM

## 2019-09-26 DIAGNOSIS — I10 Essential (primary) hypertension: Secondary | ICD-10-CM

## 2019-09-26 DIAGNOSIS — M5440 Lumbago with sciatica, unspecified side: Secondary | ICD-10-CM

## 2019-09-26 DIAGNOSIS — Z1239 Encounter for other screening for malignant neoplasm of breast: Secondary | ICD-10-CM

## 2019-09-26 NOTE — Telephone Encounter (Signed)
Patient informed. She has had no issues with miralax or previous reactions. The only problem she has had was with the reaction she had due to mammal meat.

## 2019-09-26 NOTE — Telephone Encounter (Signed)
OK I do not think that she needs testing at this point in time.  Salvatore Marvel, MD Allergy and [place] [facility]

## 2019-09-26 NOTE — Telephone Encounter (Signed)
Patient was informed.

## 2019-09-26 NOTE — Telephone Encounter (Signed)
Looking through [name]'s allergy list, I do not see any reactions to vaccines in the past. Please ask about past vaccine reactions and if she ever had a reaction to polyethylene glycol (PEG).  PEG is found as a component in other vaccines as well as in bowel prep and medications like Miralax.    To date, people who have a history of anaphylaxis to polyethylene glycol (PEG) are the ones that should NOT get the vaccine without prior testing.    Also, if she has had facial fillers in the past, then avoid the Moderna vaccine and get [employer] one. There has been some potential cross reaction with facial fillers and the Moderna vaccine.  Below are the recommendations regarding people with allergies: People with common allergies to medications, foods, aeroallergens, venom, and latex are probably no more likely than the general public to have an allergic reaction to the mRNA COVID-19  (Moderna and Pfizer) vaccines.  Thus, I recommend that she receive the COVID vaccine.  I would recommend that if she has an epinephrine device, then she should take this with her  for her vaccination and to wait for a 30 minute observation time.     she has to weigh the risks and benefits, but given her medical history, there is no contraindications that I can see.    I do not think that alpha gal has anything to do with COVID vaccine, but I can certainly order it again.  Salvatore Marvel, MD Allergy and [place] [facility]

## 2019-09-26 NOTE — Telephone Encounter (Signed)
Patient's grandson called again this morning about this message from yesterday.

## 2019-09-26 NOTE — Patient Instructions (Addendum)
Wellness with NP when due  Please schedule mammogram and provide patient with the necessary information  F/U in office with MD in 4 months, call if you need me before.   Fasting lipid, cmp and EGFR 1 week before next visit with MD  Please be careful not to fall, use assistive device when needed  Please call and let me know if you decide to get PT, I believe it will help your chronic pain  Thanks for choosing [facility], we consider it a privelige to serve you.

## 2019-09-26 NOTE — Progress Notes (Signed)
Virtual Visit via Telephone Note  I connected with [name] on 09/26/19 at  2:40 PM EST by telephone and verified that I am speaking with the correct person using two identifiers.  Location: Patient: home Provider: office   I discussed the limitations, risks, security and privacy concerns of performing an evaluation and management service by telephone and the availability of in person appointments. I also discussed with the patient that there may be a patient responsible charge related to this service. The patient expressed understanding and agreed to proceed.   History of Present Illness: F/U chronic problems, medication review, and refill medication when necessary. Review most recent labs and order labs which are due Review preventive health and update with necessary referrals or immunizations as indicated Recent increase to pain medication up to 4 /day, which py states she is unable to tolerate and will use at most 2/ day, I advised she discuss with Provider Denies any falls, but does report increased debility , pain and limitation in mobility   Observations/Objective: BP 138/86   Ht 5' (1.524 m)   Wt 159 lb (72.1 kg)   BMI 31.05 kg/m  Good communication with no confusion and intact memory. Alert and oriented x 3 No signs of respiratory distress during speech    Assessment and Plan: Essential hypertension Controlled, no change in medication   Hypothyroid Updated lab needed at/ before next visit. Controlled when last checked  Hyperlipidemia Hyperlipidemia:Low fat diet discussed and encouraged.   Lipid Panel  Lab Results  Component Value Date   CHOL 174 06/22/2019   HDL 44 (L) 06/22/2019   LDLCALC 101 (H) 06/22/2019   TRIG 173 (H) 06/22/2019   CHOLHDL 4.0 06/22/2019   Needs top educe fatty foods Updated lab needed at/ before next visit.     Back pain of lumbar region with sciatica Needs reduced dosing of medication and I recommend physical therapy,  she will consider this At increased fall risk due to severe arthritis, home safety and t risk reduction is dscussed    Follow Up Instructions:    I discussed the assessment and treatment plan with the patient. The patient was provided an opportunity to ask questions and all were answered. The patient agreed with the plan and demonstrated an understanding of the instructions.   The patient was advised to call back or seek an in-person evaluation if the symptoms worsen or if the condition fails to improve as anticipated.  I provided 15 minutes of non-face-to-face time during this encounter.   Tula Nakayama, MD

## 2019-09-29 ENCOUNTER — Encounter: Payer: Self-pay | Admitting: Family Medicine

## 2019-09-29 NOTE — Assessment & Plan Note (Signed)
Updated lab needed at/ before next visit. Controlled when last checked 

## 2019-09-29 NOTE — Assessment & Plan Note (Signed)
Needs reduced dosing of medication and I recommend physical therapy, she will consider this At increased fall risk due to severe arthritis, home safety and t risk reduction is dscussed

## 2019-09-29 NOTE — Assessment & Plan Note (Signed)
Hyperlipidemia:Low fat diet discussed and encouraged.   Lipid Panel  Lab Results  Component Value Date   CHOL 174 06/22/2019   HDL 44 (L) 06/22/2019   LDLCALC 101 (H) 06/22/2019   TRIG 173 (H) 06/22/2019   CHOLHDL 4.0 06/22/2019   Needs top educe fatty foods Updated lab needed at/ before next visit.

## 2019-09-29 NOTE — Assessment & Plan Note (Signed)
Controlled, no change in medication  

## 2019-10-01 ENCOUNTER — Other Ambulatory Visit: Payer: Self-pay

## 2019-10-01 MED ORDER — ISOSORBIDE MONONITRATE ER 30 MG PO TB24: 30.0000 mg | ORAL_TABLET | Freq: Every day | ORAL | 3 refills | Status: DC

## 2019-10-02 LAB — ALPHA-GAL PANEL
Alpha Gal IgE*: 0.5 kU/L — ABNORMAL HIGH (ref <0.10)
Beef (Bos spp) IgE: 0.18 kU/L (ref <0.35)
Class Interpretation: 0
Lamb/Mutton (Ovis spp) IgE: 0.12 kU/L (ref <0.35)
Pork (Sus spp) IgE: <0.1 kU/L (ref <0.35)

## 2019-10-03 ENCOUNTER — Encounter: Payer: Self-pay | Admitting: Allergy & Immunology

## 2019-10-03 ENCOUNTER — Ambulatory Visit (INDEPENDENT_AMBULATORY_CARE_PROVIDER_SITE_OTHER): Payer: Medicare HMO | Admitting: Allergy & Immunology

## 2019-10-03 ENCOUNTER — Other Ambulatory Visit: Payer: Self-pay

## 2019-10-03 VITALS — BP 110/78 | HR 97 | Temp 97.8°F | Resp 18

## 2019-10-03 DIAGNOSIS — T7800XD Anaphylactic reaction due to unspecified food, subsequent encounter: Secondary | ICD-10-CM

## 2019-10-03 NOTE — Patient Instructions (Addendum)
1. 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 - Go ahead and introduce at home. - You should be fine with introducing oysters at home. - Let me know how it goes.   2. Return if symptoms worsen or fail to improve. This can be an in-person, a virtual Webex or a telephone follow up visit.   Please inform us of any Emergency Department visits, hospitalizations, or changes in symptoms. Call us before going to the ED for breathing or allergy symptoms since we might be able to fit you in for a sick visit. Feel free to contact us anytime with any questions, problems, or concerns.  It was a pleasure to see you and your family again today!  Websites that have reliable patient information: 1. American Academy of Asthma, Allergy, and Immunology: www.aaaai.org 2. Food Allergy Research and Education (FARE): foodallergy.org 3. Mothers of Asthmatics: http://www.asthmacommunitynetwork.org 4. American College of Allergy, Asthma, and Immunology: www.acaai.org   COVID-19 Vaccine Information can be found at: [email or web address].[email or web address].uk For questions related to vaccine distribution or appointments, please email [email or web address] or call [phone].     "Like" [place] on Facebook and Instagram for our latest updates!        Make sure you are registered to vote! If you have moved or changed any of your contact information, you will need to get this updated before voting!  In some cases, you MAY be able to register to vote online: [email or web address]    Alpha-gal and Red Meat Allergy   Overview An allergy to "alpha-gal" refers to having a severe and potentially life-threatening allergy to a carbohydrate molecule called galactose-alpha-1,3-galactose that is found in most mammalian or "red meat". Unlike other food allergies which typically occur within minutes of ingestion, symptoms from eating red meat  such as pork, lamb or beef may be delayed, occurring 3-8 hours after eating. Most food allergies are directed against a protein molecule, but alpha-gal is unusual because it is a carbohydrate, and a delay in its absorption may explain the delay in symptoms.  What are the symptoms of an alpha-gal allergy? As with other food allergies, signs or symptoms of an allergy to alpha-gal may include: . Hives and itching  . Swelling of your lips, face or eyelids  . Shortness of breath, cough or wheezing  . Abdominal pain, nausea, diarrhea or vomiting The most severe reaction, anaphylaxis, can present as a combination of several of these symptoms, may include low blood pressure, and is potentially fatal.  Because these symptoms are delayed, you may only wake up with them in the middle of the night after an evening meal.  How is an alpha-gal allergy diagnosed? Diagnosis of this allergy starts with your allergist taking an appropriate history and physical examination. Because the onset is usually quite delayed, it can be hard to associate the symptoms with eating red meat many hours previously. Triggers include any red meat - including beef, pork, lamb or even horse products. It may occur after eating hotdogs and hamburgers. In very rare cases the reaction may extend to milk or dairy proteins and gelatin.  Your allergist may recommend testing that includes skin tests to the relevant animal proteins and blood tests which measure the levels of a specific immunoglobulin E (IgE) antibody, to mammalian meats. An investigational blood test, IgE against alpha-gal itself, may also aid in the diagnosis.  How is an alpha-gal allergy treated? Immediate symptoms such as hives or shortness of breath are treated the same as any other food  allergy - in an urgent care setting with anti-histamines, epinephrine and other medications. Prevention long-term involves avoidance of all red meat in sensitized individuals. You may be  advised to carry an epinephrine auto-injector, to be used in case of subsequent accidental exposures and reaction. These measures do not necessarily mean switching to a full vegetarian diet, since poultry and fish can be consumed and do not cause similar reactions. As with other food allergies, there is the possibility that over time the sensitivity diminishes - although these changes may take many years to become apparent.  How do you become allergic to alpha-gal? Alpha-gal is a molecule carried in the saliva of the [place] tick and other potential arthropods typically after feeding on mammalian blood. People that are bitten by the tick, especially those that are bitten repeatedly, are at risk of becoming sensitized and producing the IgE necessary to then cause allergic reactions. Interestingly, allergic reactions may occur to red meat, to subsequent tick bites, and even to medications that contain alpha-gal. Cetuximab is a cancer medication that contains alpha-gal, and people who have had allergic reactions to this medication (these are typically immediate reactions, because it is infused intravenously) have a higher risk for red meat allergy and are likely to have been bitten by ticks in the past. As might be expected, the incidence of tick bites is much higher in the [place] and [place] the traditional habitat for the tick. However, cases are now increasingly reported in the [place] and [place] states. And it is a phenomenon that has been observed worldwide, with different ticks responsible for similar cases of red meat allergy in many other countries such as [place], [place] and [place].  The discovery of this peculiar allergy has allowed researchers to correlate tick bites with many cases of anaphylaxis that would previously have been classified as 'idiopathic', or of unknown cause. Also, while it was originally thought that the [employer] tick had to feast on mammalian blood in order to  carry the alpha-gal molecule, more recent research has shown that it may carry this molecule and be capable of sensitizing humans independently.  How do you prevent an alpha-gal allergy? Because this allergy is predominantly tick born, you are more likely at risk if you often go outdoors in wooded areas for activities such as hiking, fishing or hunting. The key strategy is to prevent tick bites. This may include wearing long sleeved shirts or pants, using appropriate insect repellants, and surveying for ticks after spending time outdoors. Any observed ticks should be removed carefully by cleaning the site with rubbing alcohol, then using tweezers to pull the tick's head up carefully from the skin using steady pressure. Clean your hands and the site one more time and make sure not to crush the tick between your fingers.

## 2019-10-03 NOTE — Progress Notes (Signed)
FOLLOW UP  Date of Service/Encounter:  10/03/19   Assessment:   Anaphylactic shock due to food (shrimp) - tolerated challenge today  Plan/Recommendations:   1. 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 - Go ahead and introduce at home. - You should be fine with introducing oysters at home. - Let me know how it goes.   2. Return if symptoms worsen or fail to improve. This can be an in-person, a virtual Webex or a telephone follow up visit.   Subjective:   [name] is a 86 y.o. female presenting today for follow up of  Chief Complaint  Patient presents with  . Food/Drug Challenge    Shrimp    [name] has a history of the following: Patient Active Problem List   Diagnosis Date Noted  . Red eye 04/23/2019  . Angioedema 02/02/2019  . Fall 07/20/2017  . Phobia 05/17/2017  . Annual physical exam 04/29/2017  . Seafood allergy 01/31/2017  . Overweight (BMI 25.0-29.9) 10/12/2016  . CAD in native artery   . Osteoarthritis 04/17/2013  . Coronary vasospasm ([place]) 02/07/2013  . Hypothyroid 08/14/2012  . Prediabetes 10/10/2011  . CARPAL TUNNEL SYNDROME, BILATERAL 02/19/2010  . GERD 09/15/2009  . Coronary atherosclerosis 10/01/2008  . DYSPEPSIA 07/07/2008  . Back pain of lumbar region with sciatica 07/03/2008  . Hyperlipidemia 11/24/2007  . Essential hypertension 11/24/2007    History obtained from: chart review and patient and her son.  [name] is a 86 y.o. female presenting for a food challenge to shrimp.  She has had both negative skin testing and blood testing to shrimp.  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  Since the last visit, she has done well. She brought in ten shrimp today for the challenge.   She also has a lot of questions about the COVID-19 vaccination and alpha gal.  She has been told that she is at higher risk of an anaphylactic  reaction to the COVID-19 vaccination because of her alpha gal.  We had a long discussion about this and I recommended just getting the vaccine and waiting 30 minutes.  Otherwise, there have been no changes to her past medical history, surgical history, family history, or social history.    Review of Systems  Constitutional: Negative.  Negative for chills, fever, malaise/fatigue and weight loss.  HENT: Negative.  Negative for congestion, ear discharge and ear pain.   Eyes: Negative for pain, discharge and redness.  Respiratory: Negative for cough, sputum production, shortness of breath and wheezing.   Cardiovascular: Negative.  Negative for chest pain and palpitations.  Gastrointestinal: Negative for abdominal pain, constipation, diarrhea, heartburn, nausea and vomiting.  Skin: Negative.  Negative for itching and rash.  Neurological: Negative for dizziness and headaches.  Endo/Heme/Allergies: Negative for environmental allergies. Does not bruise/bleed easily.       Objective:   Blood pressure 110/78, pulse 97, temperature 97.8 F (36.6 C), temperature source Temporal, resp. rate 18, SpO2 98 %. There is no height or weight on file to calculate BMI.   Physical Exam: deferred since this was a food challenge appointment only   Open graded shrimp oral challenge: The patient was able to tolerate the challenge today without adverse signs or symptoms. Vital signs were stable throughout the challenge and observation period. She received multiple doses separated by 10 minutes, each of which was separated by vitals and  a brief physical exam. She received the following doses: lip rub, one trip, to shrimp, 3 shrimp, and 4 shrimp. She was monitored for 60 minutes following the last dose.   The patient had negative skin prick test and sIgE tests to shrimp and was able to tolerate the open graded oral challenge today without adverse signs or symptoms. Therefore, she has the same risk of systemic  reaction associated with the consumption of shrimp as the general population.  Time of first dose: 1:35 PM  Time of discharge: 3:55 PM    Salvatore Marvel, MD  [facility]

## 2019-10-04 ENCOUNTER — Telehealth (INDEPENDENT_AMBULATORY_CARE_PROVIDER_SITE_OTHER): Payer: Self-pay

## 2019-10-04 DIAGNOSIS — E559 Vitamin D deficiency, unspecified: Secondary | ICD-10-CM

## 2019-10-04 DIAGNOSIS — E7800 Pure hypercholesterolemia, unspecified: Secondary | ICD-10-CM

## 2019-10-04 DIAGNOSIS — I10 Essential (primary) hypertension: Secondary | ICD-10-CM

## 2019-10-04 DIAGNOSIS — R7301 Impaired fasting glucose: Secondary | ICD-10-CM

## 2019-10-04 DIAGNOSIS — E039 Hypothyroidism, unspecified: Secondary | ICD-10-CM

## 2019-10-04 NOTE — Telephone Encounter (Signed)
Labs ordered.

## 2019-10-07 ENCOUNTER — Ambulatory Visit: Payer: Medicare HMO

## 2019-10-11 ENCOUNTER — Telehealth: Payer: Self-pay | Admitting: Cardiovascular Disease

## 2019-10-11 NOTE — Telephone Encounter (Signed)
We are recommending the COVID-19 vaccine to all of our patients. Cardiac medications (including blood thinners) should not deter anyone from being vaccinated and there is no need to hold any of those medications prior to vaccine administration.     Currently, there is a hotline to call (active 09/14/19) to schedule vaccination appointments as no walk-ins will be accepted.   Number: [phone].    If an appointment is not available please go to [email or web address] to sign up for notification when additional vaccine appointments are available.   If you have further questions or concerns about the vaccine process, please visit [email or web address] or contact your primary care physician.  Verbalized understanding  

## 2019-10-12 ENCOUNTER — Ambulatory Visit: Payer: Medicare HMO

## 2019-10-15 ENCOUNTER — Ambulatory Visit: Payer: Medicare HMO | Attending: Internal Medicine

## 2019-10-15 DIAGNOSIS — Z23 Encounter for immunization: Secondary | ICD-10-CM | POA: Insufficient documentation

## 2019-10-15 NOTE — Progress Notes (Signed)
   Covid-19 Vaccination Clinic  Name:  Zeniyah Zora    MRN: [id number] DOB: 1934-06-19  10/15/2019  Ms. Roel was observed post Covid-19 immunization for 30 minutes without incidence. She was provided with Vaccine Information Sheet and instruction to access the V-Safe system.   Ms. Pages was instructed to call 911 with any severe reactions post vaccine: Marland Kitchen Difficulty breathing  . Swelling of your face and throat  . A fast heartbeat  . A bad rash all over your body  . Dizziness and weakness    Immunizations Administered    Name Date Dose VIS Date Route   Pfizer COVID-19 Vaccine 10/15/2019  9:29 AM 0.3 mL 08/17/2019 Intramuscular   Manufacturer: [place]   Lot: [id number]   [place]: [id number]

## 2019-10-21 ENCOUNTER — Other Ambulatory Visit: Payer: Self-pay | Admitting: Family Medicine

## 2019-11-09 ENCOUNTER — Ambulatory Visit: Payer: Medicare HMO | Attending: Internal Medicine

## 2019-11-09 ENCOUNTER — Other Ambulatory Visit: Payer: Self-pay

## 2019-11-09 DIAGNOSIS — Z23 Encounter for immunization: Secondary | ICD-10-CM

## 2019-11-09 NOTE — Progress Notes (Signed)
   Covid-19 Vaccination Clinic  Name:  [name]    MRN: [id number] DOB: Jan 22, 1934  11/09/2019  [name] was observed post Covid-19 immunization for 30 minutes based on pre-vaccination screening without incident. She was provided with Vaccine Information Sheet and instruction to access the V-Safe system.   [name] was instructed to call 911 with any severe reactions post vaccine: Marland Kitchen Difficulty breathing  . Swelling of face and throat  . A fast heartbeat  . A bad rash all over body  . Dizziness and weakness   Immunizations Administered    Name Date Dose VIS Date Route   Pfizer COVID-19 Vaccine 11/09/2019 10:04 AM 0.3 mL 08/17/2019 Intramuscular   Manufacturer: [place]   Lot: [id number]   [place]: [id number]

## 2019-11-29 ENCOUNTER — Other Ambulatory Visit: Payer: Self-pay | Admitting: Cardiovascular Disease

## 2019-11-29 NOTE — Telephone Encounter (Signed)
*  STAT* If patient is at the pharmacy, call can be transferred to refill team.   1. Which medications need to be refilled? (please list name of each medication and dose if known)  New prescription for Nitroglycerin  2. Which pharmacy/location (including street and city if local pharmacy) is medication to be sent to [facility], [place]*  3. Do they need a 30 day or 90 day supply? 1 bottle

## 2019-11-30 MED ORDER — NITROGLYCERIN 0.4 MG/SPRAY TL SOLN: 1.0000 | 1 refills | Status: DC | PRN

## 2019-12-21 LAB — LIPID PANEL
Cholesterol: 175 mg/dL (ref <200)
HDL: 57 mg/dL (ref >=50)
LDL Cholesterol (Calc): 97 mg/dL (calc)
Non-HDL Cholesterol (Calc): 118 mg/dL (calc) (ref <130)
Total CHOL/HDL Ratio: 3.1 (calc) (ref <5.0)
Triglycerides: 109 mg/dL (ref <150)

## 2019-12-21 LAB — COMPLETE METABOLIC PANEL WITH GFR
AG Ratio: 1.5 (calc) (ref 1.0–2.5)
ALT: 10 U/L (ref 6–29)
AST: 24 U/L (ref 10–35)
Albumin: 4.1 g/dL (ref 3.6–5.1)
Alkaline phosphatase (APISO): 83 U/L (ref 37–153)
BUN/Creatinine Ratio: 19 (calc) (ref 6–22)
BUN: 18 mg/dL (ref 7–25)
CO2: 30 mmol/L (ref 20–32)
Calcium: 9.6 mg/dL (ref 8.6–10.4)
Chloride: 99 mmol/L (ref 98–110)
Creat: 0.97 mg/dL — ABNORMAL HIGH (ref 0.60–0.88)
GFR, Est African American: 63 mL/min/{1.73_m2} (ref >=60)
GFR, Est Non African American: 54 mL/min/{1.73_m2} — ABNORMAL LOW (ref >=60)
Globulin: 2.8 g/dL (calc) (ref 1.9–3.7)
Glucose, Bld: 92 mg/dL (ref 65–99)
Potassium: 4 mmol/L (ref 3.5–5.3)
Sodium: 139 mmol/L (ref 135–146)
Total Bilirubin: 0.6 mg/dL (ref 0.2–1.2)
Total Protein: 6.9 g/dL (ref 6.1–8.1)

## 2019-12-24 ENCOUNTER — Other Ambulatory Visit: Payer: Self-pay

## 2019-12-24 ENCOUNTER — Telehealth (INDEPENDENT_AMBULATORY_CARE_PROVIDER_SITE_OTHER): Payer: Medicare HMO

## 2019-12-24 VITALS — BP 110/78 | Ht 60.0 in | Wt 159.0 lb

## 2019-12-24 DIAGNOSIS — Z0000 Encounter for general adult medical examination without abnormal findings: Secondary | ICD-10-CM

## 2019-12-24 NOTE — Patient Instructions (Signed)
[name] , Thank you for taking time to come for your Medicare Wellness Visit. I appreciate your ongoing commitment to your health goals. Please review the following plan we discussed and let me know if I can assist you in the future.   Screening recommendations/referrals: Colonoscopy: Due Dec 2021 Mammogram: scheduled Bone Density: up to date Recommended yearly ophthalmology/optometry visit for glaucoma screening and checkup Recommended yearly dental visit for hygiene and checkup  Vaccinations: Influenza vaccine: up to date Pneumococcal vaccine: up to date  Tdap vaccine: up to date  Shingles vaccine: will check with pharmacy    Advanced directives: declined packet   Conditions/risks identified: fall risk  Next appointment: 1 year    Preventive Care 30 Years and Older, Female Preventive care refers to lifestyle choices and visits with your health care provider that can promote health and wellness. What does preventive care include?  A yearly physical exam. This is also called an annual well check.  Dental exams once or twice a year.  Routine eye exams. Ask your health care provider how often you should have your eyes checked.  Personal lifestyle choices, including:  Daily care of your teeth and gums.  Regular physical activity.  Eating a healthy diet.  Avoiding tobacco and drug use.  Limiting alcohol use.  Practicing safe sex.  Taking low-dose aspirin every day.  Taking vitamin and mineral supplements as recommended by your health care provider. What happens during an annual well check? The services and screenings done by your health care provider during your annual well check will depend on your age, overall health, lifestyle risk factors, and family history of disease. Counseling  Your health care provider may ask you questions about your:  Alcohol use.  Tobacco use.  Drug use.  Emotional well-being.  Home and relationship well-being.  Sexual  activity.  Eating habits.  History of falls.  Memory and ability to understand (cognition).  Work and work Statistician.  Reproductive health. Screening  You may have the following tests or measurements:  Height, weight, and BMI.  Blood pressure.  Lipid and cholesterol levels. These may be checked every 5 years, or more frequently if you are over 41 years old.  Skin check.  Lung cancer screening. You may have this screening every year starting at age 2 if you have a 30-pack-year history of smoking and currently smoke or have quit within the past 15 years.  Fecal occult blood test (FOBT) of the stool. You may have this test every year starting at age 53.  Flexible sigmoidoscopy or colonoscopy. You may have a sigmoidoscopy every 5 years or a colonoscopy every 10 years starting at age 9.  Hepatitis C blood test.  Hepatitis B blood test.  Sexually transmitted disease (STD) testing.  Diabetes screening. This is done by checking your blood sugar (glucose) after you have not eaten for a while (fasting). You may have this done every 1-3 years.  Bone density scan. This is done to screen for osteoporosis. You may have this done starting at age 7.  Mammogram. This may be done every 1-2 years. Talk to your health care provider about how often you should have regular mammograms. Talk with your health care provider about your test results, treatment options, and if necessary, the need for more tests. Vaccines  Your health care provider may recommend certain vaccines, such as:  Influenza vaccine. This is recommended every year.  Tetanus, diphtheria, and acellular pertussis (Tdap, Td) vaccine. You may need a Td booster  every 10 years.  Zoster vaccine. You may need this after age 23.  Pneumococcal 13-valent conjugate (PCV13) vaccine. One dose is recommended after age 33.  Pneumococcal polysaccharide (PPSV23) vaccine. One dose is recommended after age 39. Talk to your health care  provider about which screenings and vaccines you need and how often you need them. This information is not intended to replace advice given to you by your health care provider. Make sure you discuss any questions you have with your health care provider. Document Released: 09/19/2015 Document Revised: 05/12/2016 Document Reviewed: 06/24/2015 Elsevier Interactive Patient Education  2017 [employer] Prevention in the Home Falls can cause injuries. They can happen to people of all ages. There are many things you can do to make your home safe and to help prevent falls. What can I do on the outside of my home?  Regularly fix the edges of walkways and driveways and fix any cracks.  Remove anything that might make you trip as you walk through a door, such as a raised step or threshold.  Trim any bushes or trees on the path to your home.  Use bright outdoor lighting.  Clear any walking paths of anything that might make someone trip, such as rocks or tools.  Regularly check to see if handrails are loose or broken. Make sure that both sides of any steps have handrails.  Any raised decks and porches should have guardrails on the edges.  Have any leaves, snow, or ice cleared regularly.  Use sand or salt on walking paths during winter.  Clean up any spills in your garage right away. This includes oil or grease spills. What can I do in the bathroom?  Use night lights.  Install grab bars by the toilet and in the tub and shower. Do not use towel bars as grab bars.  Use non-skid mats or decals in the tub or shower.  If you need to sit down in the shower, use a plastic, non-slip stool.  Keep the floor dry. Clean up any water that spills on the floor as soon as it happens.  Remove soap buildup in the tub or shower regularly.  Attach bath mats securely with double-sided non-slip rug tape.  Do not have throw rugs and other things on the floor that can make you trip. What can I do in  the bedroom?  Use night lights.  Make sure that you have a light by your bed that is easy to reach.  Do not use any sheets or blankets that are too big for your bed. They should not hang down onto the floor.  Have a firm chair that has side arms. You can use this for support while you get dressed.  Do not have throw rugs and other things on the floor that can make you trip. What can I do in the kitchen?  Clean up any spills right away.  Avoid walking on wet floors.  Keep items that you use a lot in easy-to-reach places.  If you need to reach something above you, use a strong step stool that has a grab bar.  Keep electrical cords out of the way.  Do not use floor polish or wax that makes floors slippery. If you must use wax, use non-skid floor wax.  Do not have throw rugs and other things on the floor that can make you trip. What can I do with my stairs?  Do not leave any items on the stairs.  Make  sure that there are handrails on both sides of the stairs and use them. Fix handrails that are broken or loose. Make sure that handrails are as long as the stairways.  Check any carpeting to make sure that it is firmly attached to the stairs. Fix any carpet that is loose or worn.  Avoid having throw rugs at the top or bottom of the stairs. If you do have throw rugs, attach them to the floor with carpet tape.  Make sure that you have a light switch at the top of the stairs and the bottom of the stairs. If you do not have them, ask someone to add them for you. What else can I do to help prevent falls?  Wear shoes that:  Do not have high heels.  Have rubber bottoms.  Are comfortable and fit you well.  Are closed at the toe. Do not wear sandals.  If you use a stepladder:  Make sure that it is fully opened. Do not climb a closed stepladder.  Make sure that both sides of the stepladder are locked into place.  Ask someone to hold it for you, if possible.  Clearly mark and  make sure that you can see:  Any grab bars or handrails.  First and last steps.  Where the edge of each step is.  Use tools that help you move around (mobility aids) if they are needed. These include:  Canes.  Walkers.  Scooters.  Crutches.  Turn on the lights when you go into a dark area. Replace any light bulbs as soon as they burn out.  Set up your furniture so you have a clear path. Avoid moving your furniture around.  If any of your floors are uneven, fix them.  If there are any pets around you, be aware of where they are.  Review your medicines with your doctor. Some medicines can make you feel dizzy. This can increase your chance of falling. Ask your doctor what other things that you can do to help prevent falls. This information is not intended to replace advice given to you by your health care provider. Make sure you discuss any questions you have with your health care provider. Document Released: 06/19/2009 Document Revised: 01/29/2016 Document Reviewed: 09/27/2014 Elsevier Interactive Patient Education  2017 [employer].

## 2019-12-24 NOTE — Progress Notes (Signed)
Subjective:   [name] is a 86 y.o. female who presents for Medicare Annual (Subsequent) preventive examination.  Review of Systems:   Cardiac Risk Factors include: advanced age (>25men, >46 women);dyslipidemia;hypertension;obesity (BMI >30kg/m2);sedentary lifestyle     Objective:     Vitals: BP 110/78   Ht 5' (1.524 m)   Wt 159 lb (72.1 kg)   BMI 31.05 kg/m   Body mass index is 31.05 kg/m.  Advanced Directives 02/02/2019 02/02/2019 01/17/2019 12/22/2018 12/22/2018 12/19/2017 09/09/2017  Does Patient Have a Medical Advance Directive? No No No Yes Yes No No  Does patient want to make changes to medical advance directive? - - - - No - Patient declined - -  Would patient like information on creating a medical advance directive? No - Patient declined No - Patient declined - - - Yes (MAU/Ambulatory/Procedural Areas - Information given) -  Pre-existing out of facility DNR order (yellow form or pink MOST form) - - - - - - -    Tobacco Social History   Tobacco Use  Smoking Status Former Smoker  . Types: Cigarettes  . Quit date: 09/06/1984  . Years since quitting: 35.3  Smokeless Tobacco Never Used     Counseling given: Not Answered   Clinical Intake:     Pain : 0-10 Pain Score: 10-Worst pain ever Pain Type: Chronic pain Pain Location: Back Pain Orientation: Lower     Nutritional Status: BMI > 30  Obese Diabetes: No  How often do you need to have someone help you when you read instructions, pamphlets, or other written materials from your doctor or pharmacy?: 1 - Never  Interpreter Needed?: No     Past Medical History:  Diagnosis Date  . Anemia   . Blood clot associated with vein wall inflammation    hx of in right leg  . CAD (coronary artery disease)   . Carpal tunnel syndrome on left    Left Hand pain with numbness, new onset   . Chronic back pain   . Chronic back pain    buldging disc  . Constipation   . Diarrhea   . Difficult intubation    small  airway  . DJD (degenerative joint disease)    Severe  . Gastric ulcer    hx of   . GERD (gastroesophageal reflux disease)    takes Omeprazole daily  . Headache(784.0)    occasionally  . Hyperlipidemia    takes Lipitor nightly  . Hypertension    takes Amlodipine and Metoprolol daily  . Nocturia   . Obesity   . Pneumonia    hx of-in 2011  . Shortness of breath    with exertion  . Sleep apnea    sleep study done 19yrs ago  . Thyroid mass    right   Past Surgical History:  Procedure Laterality Date  . ABDOMINAL HYSTERECTOMY    . APPENDECTOMY  1987  . Arthoscopy left knee  1992  . Arthroscopy left shoulder  1999  . Back  Surgery  lumbar  1989 /1999   x 2  . BACK SURGERY    . CARDIAC CATHETERIZATION  05/2010  . CARDIAC CATHETERIZATION N/A 09/30/2015   Procedure: Left Heart Cath and Coronary Angiography;  Surgeon: Troy Sine, MD;  Location: [facility];  Service: Cardiovascular;  Laterality: N/A;  . Carpal tunnel release right     x 2  . CHOLECYSTECTOMY    . COLONOSCOPY    . ESOPHAGOGASTRODUODENOSCOPY    .  LUMBAR FUSION  March 19, 2014  . NM MYOCAR PERF WALL MOTION  11/12/2008   no ischemia  . PARTIAL HYSTERECTOMY  1972  . ROTATOR CUFF REPAIR     right  . SPINE SURGERY    . THYROIDECTOMY  11/17/2011   Procedure: THYROIDECTOMY;  Surgeon: Ascencion Dike, MD;  Location: [facility];  Service: ENT;  Laterality: Right;  WITH FROZEN SECTION  . TONSILLECTOMY     Family History  Problem Relation Age of Onset  . Anuerysm Mother        died at 19  . Stroke Father        71 at death   . Anesthesia problems Neg Hx   . Hypotension Neg Hx   . Malignant hyperthermia Neg Hx   . Pseudochol deficiency Neg Hx   . Allergic rhinitis Neg Hx   . Angioedema Neg Hx   . Asthma Neg Hx   . Atopy Neg Hx   . Eczema Neg Hx   . Immunodeficiency Neg Hx   . Urticaria Neg Hx    Social History   Socioeconomic History  . Marital status: Widowed    Spouse name: Not on file  . Number of  children: 4  . Years of education: Not on file  . Highest education level: Not on file  Occupational History    Employer: RETIRED  Tobacco Use  . Smoking status: Former Smoker    Types: Cigarettes    Quit date: 09/06/1984    Years since quitting: 35.3  . Smokeless tobacco: Never Used  Substance and Sexual Activity  . Alcohol use: No    Comment: Casual  . Drug use: No  . Sexual activity: Never    Birth control/protection: Surgical  Other Topics Concern  . Not on file  Social History Narrative  . Not on file   Social Determinants of Health   Financial Resource Strain: Low Risk   . Difficulty of Paying Living Expenses: Not hard at all  Food Insecurity: No Food Insecurity  . Worried About Charity fundraiser in the Last Year: Never true  . Ran Out of Food in the Last Year: Never true  Transportation Needs: No Transportation Needs  . Lack of Transportation (Medical): No  . Lack of Transportation (Non-Medical): No  Physical Activity: Insufficiently Active  . Days of Exercise per Week: 6 days  . Minutes of Exercise per Session: 10 min  Stress:   . Feeling of Stress :   Social Connections: Moderately Isolated  . Frequency of Communication with Friends and Family: More than three times a week  . Frequency of Social Gatherings with Friends and Family: Twice a week  . Attends Religious Services: Never  . Active Member of Clubs or Organizations: No  . Attends Archivist Meetings: Never  . Marital Status: Divorced    Outpatient Encounter Medications as of 12/24/2019  Medication Sig  . amLODipine (NORVASC) 10 MG tablet TAKE 1 TABLET(10 MG) BY MOUTH DAILY (Patient taking differently: Take 10 mg by mouth every evening. )  . Ascorbic Acid (VITAMIN C) 500 MG tablet Take 500 mg by mouth daily.   Marland Kitchen aspirin EC 81 MG tablet Take 81 mg by mouth daily.  Marland Kitchen atorvastatin (LIPITOR) 40 MG tablet TAKE 1 TABLET(40 MG) BY MOUTH DAILY AT 6 PM (Patient taking differently: Take 40 mg by mouth  every evening. )  . calcium carbonate (OS-CAL - DOSED IN MG OF ELEMENTAL CALCIUM) 1250 MG tablet Take  1 tablet by mouth daily.  . Cyanocobalamin (B-12) 1000 MCG CAPS Take 1 capsule by mouth daily.  . diclofenac sodium (VOLTAREN) 1 % GEL Apply 2 g topically 2 (two) times daily as needed (for back pain).  Marland Kitchen EPINEPHrine (EPIPEN 2-PAK) 0.3 mg/0.3 mL IJ SOAJ injection inject as directed for SEVERE ALLERGIC REACTIONS (Patient taking differently: Inject 0.3 mg into the muscle See admin instructions. inject as directed for SEVERE ALLERGIC REACTIONS)  . famotidine (PEPCID) 20 MG tablet Take 1 tablet (20 mg total) by mouth 2 (two) times daily.  . fluticasone (FLONASE) 50 MCG/ACT nasal spray SHAKE LIQUID AND USE 1 SPRAY IN EACH NOSTRIL DAILY (Patient taking differently: Place 1 spray into both nostrils daily as needed for allergies. )  . hydrochlorothiazide (MICROZIDE) 12.5 MG capsule TAKE 1 CAPSULE(12.5 MG) BY MOUTH DAILY  . HYDROcodone-acetaminophen (NORCO) 7.5-325 MG tablet Take 1 tablet by mouth daily as needed for moderate pain. (Patient taking differently: Take 1 tablet by mouth 2 (two) times daily. )  . isosorbide mononitrate (IMDUR) 30 MG 24 hr tablet Take 1 tablet (30 mg total) by mouth daily.  Marland Kitchen levothyroxine (SYNTHROID) 25 MCG tablet TAKE 1 TABLET BY MOUTH EVERY MORNING  . Multiple Vitamin (MULITIVITAMIN WITH MINERALS) TABS Take 1 tablet by mouth daily.  . nitroGLYCERIN (NITROLINGUAL) 0.4 MG/SPRAY spray Place 1 spray under the tongue every 5 (five) minutes x 3 doses as needed for chest pain.  . potassium gluconate 595 (99 K) MG TABS tablet Take 595 mg by mouth every evening.   No facility-administered encounter medications on file as of 12/24/2019.    Activities of Daily Living In your present state of health, do you have any difficulty performing the following activities: 12/24/2019 02/02/2019  Hearing? N N  Vision? N N  Difficulty concentrating or making decisions? N N  Walking or climbing  stairs? Y Y  Dressing or bathing? N N  Doing errands, shopping? N N  Preparing Food and eating ? N -  Using the Toilet? N -  In the past six months, have you accidently leaked urine? N -  Do you have problems with loss of bowel control? N -  Managing your Medications? N -  Managing your Finances? N -  Some recent data might be hidden    Patient Care Team: Fayrene Helper, MD as PCP - General Leta Baptist, MD as Attending Physician (Otolaryngology) Gala Romney Cristopher Estimable, MD as Attending Physician (Gastroenterology) Latanya Maudlin, MD as Consulting Physician (Orthopedic Surgery) Sanda Klein, MD as Consulting Physician (Cardiology)    Assessment:   This is a routine wellness examination for [name].  Exercise Activities and Dietary recommendations Current Exercise Habits: Home exercise routine, Time (Minutes): 15, Frequency (Times/Week): 6, Weekly Exercise (Minutes/Week): 90, Intensity: Mild, Exercise limited by: orthopedic condition(s)  Goals    . DIET - INCREASE WATER INTAKE     Drinks several bottles of water everyday    . LIFESTYLE - DECREASE FALLS RISK    . Prevent falls     Uses cane when ambulating since she is alone a lot of the time at home        Fall Risk Fall Risk  12/24/2019 09/26/2019 05/21/2019 04/23/2019 02/14/2019  Falls in the past year? 0 0 0 0 0  Number falls in past yr: 0 0 0 0 0  Injury with Fall? 0 0 0 0 0  Risk for fall due to : - - - - -   Is the patient's home free  of loose throw rugs in walkways, pet beds, electrical cords, etc?   yes      Grab bars in the bathroom? yes      Handrails on the stairs?   yes      Adequate lighting?   yes  Timed Get Up and Go performed: unable due to virtual visit   Depression Screen PHQ 2/9 Scores 05/21/2019 05/21/2019 12/22/2018 10/02/2018  PHQ - 2 Score 2 2 1  0  PHQ- 9 Score 5 4 - -     Cognitive Function MMSE - Mini Mental State Exam 09/26/2017 01/27/2017  Orientation to time 5 5  Orientation to Place 5 5   Registration 3 3  Attention/ Calculation 5 5  Recall 0 3  Language- name 2 objects 2 -  Language- repeat 1 -  Language- follow 3 step command 3 -  Language- read & follow direction 1 -  Write a sentence 0 -  Copy design 1 -  Total score 26 -     6CIT Screen 12/24/2019 12/22/2018 12/22/2018 12/19/2017  What Year? 0 points 0 points - 0 points  What month? 0 points 0 points - 0 points  What time? 0 points 0 points 0 points 3 points  Count back from 20 0 points 0 points - 0 points  Months in reverse 0 points 0 points - 0 points  Repeat phrase 4 points 0 points - 8 points  Total Score 4 0 - 11    Immunization History  Administered Date(s) Administered  . Fluad Quad(high Dose 65+) 05/28/2019  . H1N1 10/01/2008  . Influenza Split 05/18/2012  . Influenza Whole 05/26/2007, 06/12/2009, 05/26/2011  . Influenza,inj,Quad PF,6+ Mos 06/22/2013, 06/25/2014, 08/06/2015, 09/28/2016, 04/27/2017, 05/02/2018  . PFIZER SARS-COV-2 Vaccination 10/15/2019, 11/09/2019  . Pneumococcal Conjugate-13 03/17/2015  . Pneumococcal Polysaccharide-23 02/11/2004  . Td 02/11/2004  . Tdap 07/14/2013  . Zoster 12/26/2006    Qualifies for Shingles Vaccine?check with pharmacy   Screening Tests Health Maintenance  Topic Date Due  . INFLUENZA VACCINE  04/06/2020  . TETANUS/TDAP  07/15/2023  . DEXA SCAN  Completed  . COVID-19 Vaccine  Completed  . PNA vac Low Risk Adult  Completed    Cancer Screenings: Lung: Low Dose CT Chest recommended if Age 63-80 years, 30 pack-year currently smoking OR have quit w/in 15years. Patient does not qualify. Breast:  Up to date on Mammogram? Yes   Up to date of Bone Density/Dexa? Yes Colorectal: due 08/2020  Additional Screenings: Hepatitis C Screening: done     Plan:     I have personally reviewed and noted the following in the patient's chart:   . Medical and social history . Use of alcohol, tobacco or illicit drugs  . Current medications and supplements .  Functional ability and status . Nutritional status . Physical activity . Advanced directives . List of other physicians . Hospitalizations, surgeries, and ER visits in previous 12 months . Vitals . Screenings to include cognitive, depression, and falls . Referrals and appointments  In addition, I have reviewed and discussed with patient certain preventive protocols, quality metrics, and best practice recommendations. A written personalized care plan for preventive services as well as general preventive health recommendations were provided to patient.     Kate Sable, LPN, LPN  [id number]

## 2019-12-25 ENCOUNTER — Encounter: Payer: Medicare HMO | Admitting: Family Medicine

## 2020-01-09 ENCOUNTER — Ambulatory Visit (HOSPITAL_COMMUNITY)
Admission: RE | Admit: 2020-01-09 | Discharge: 2020-01-09 | Disposition: A | Discharge status: Home / Self Care | Payer: Medicare HMO | Source: Ambulatory Visit | Attending: Physician Assistant | Admitting: Physician Assistant

## 2020-01-09 ENCOUNTER — Other Ambulatory Visit: Payer: Self-pay

## 2020-01-09 ENCOUNTER — Other Ambulatory Visit (HOSPITAL_COMMUNITY): Payer: Self-pay | Admitting: Physician Assistant

## 2020-01-09 DIAGNOSIS — M189 Osteoarthritis of first carpometacarpal joint, unspecified: Secondary | ICD-10-CM | POA: Diagnosis not present

## 2020-01-25 ENCOUNTER — Other Ambulatory Visit: Payer: Self-pay

## 2020-01-25 ENCOUNTER — Ambulatory Visit (HOSPITAL_COMMUNITY)
Admission: RE | Admit: 2020-01-25 | Discharge: 2020-01-25 | Disposition: A | Discharge status: Home / Self Care | Payer: Medicare HMO | Source: Ambulatory Visit | Attending: Family Medicine | Admitting: Family Medicine

## 2020-01-25 DIAGNOSIS — Z1231 Encounter for screening mammogram for malignant neoplasm of breast: Secondary | ICD-10-CM | POA: Diagnosis not present

## 2020-01-25 DIAGNOSIS — Z1239 Encounter for other screening for malignant neoplasm of breast: Secondary | ICD-10-CM

## 2020-01-28 ENCOUNTER — Ambulatory Visit: Payer: Medicare HMO | Admitting: Family Medicine

## 2020-02-08 ENCOUNTER — Other Ambulatory Visit: Payer: Self-pay | Admitting: Family Medicine

## 2020-02-12 ENCOUNTER — Other Ambulatory Visit: Payer: Self-pay | Admitting: Family Medicine

## 2020-02-13 ENCOUNTER — Other Ambulatory Visit: Payer: Self-pay | Admitting: *Deleted

## 2020-02-13 ENCOUNTER — Telehealth: Payer: Self-pay

## 2020-02-13 MED ORDER — POTASSIUM GLUCONATE 595 (99 K) MG PO TABS: 595.0000 mg | ORAL_TABLET | Freq: Every evening | ORAL | 1 refills | Status: DC

## 2020-02-13 NOTE — Telephone Encounter (Signed)
It does not look like this has been filled by [place]. Would you like to fill this?

## 2020-02-13 NOTE — Telephone Encounter (Signed)
Please call in potassium gluconate 595 (99 K) MG TABS tablet to [facility]

## 2020-02-13 NOTE — Telephone Encounter (Signed)
Medication sent to pt pharmacy 

## 2020-02-13 NOTE — Telephone Encounter (Signed)
Pls do send in 2 month supply, I have reviewed the record, thanks

## 2020-02-20 ENCOUNTER — Encounter: Payer: Self-pay | Admitting: Family Medicine

## 2020-02-20 ENCOUNTER — Ambulatory Visit (INDEPENDENT_AMBULATORY_CARE_PROVIDER_SITE_OTHER): Payer: Medicare HMO | Admitting: Family Medicine

## 2020-02-20 ENCOUNTER — Other Ambulatory Visit: Payer: Self-pay

## 2020-02-20 VITALS — BP 122/67 | HR 97 | Temp 98.2°F | Resp 15 | Ht 62.0 in | Wt 170.0 lb

## 2020-02-20 DIAGNOSIS — E559 Vitamin D deficiency, unspecified: Secondary | ICD-10-CM

## 2020-02-20 DIAGNOSIS — E7800 Pure hypercholesterolemia, unspecified: Secondary | ICD-10-CM | POA: Diagnosis not present

## 2020-02-20 DIAGNOSIS — E039 Hypothyroidism, unspecified: Secondary | ICD-10-CM | POA: Diagnosis not present

## 2020-02-20 DIAGNOSIS — E663 Overweight: Secondary | ICD-10-CM

## 2020-02-20 DIAGNOSIS — I10 Essential (primary) hypertension: Secondary | ICD-10-CM | POA: Diagnosis not present

## 2020-02-20 DIAGNOSIS — M5440 Lumbago with sciatica, unspecified side: Secondary | ICD-10-CM

## 2020-02-20 NOTE — Patient Instructions (Addendum)
Annual physical exam with MD  End September, call if you need me before  Chem 7 and EGFR, CBC, TSH, and vit D non fast  5 days before next visit Please contact me if hand pain is not addressed by pain clinic, then I will refer you to Orthopedics   Be careful  Not to fall and use your cane all the time for stabiility  Labs are excellent Thanks for choosing [facility], we consider it a privelige to serve you.   \ 

## 2020-02-20 NOTE — Progress Notes (Signed)
[name]     MRN: [id number]      DOB: [birth year]   HPI [name] is here for follow up and re-evaluation of chronic medical conditions, medication management and review of any available recent lab and radiology data.  Preventive health is updated, specifically  Cancer screening and Immunization.   Questions or concerns regarding consultations or procedures which the PT has had in the interim are  addressed. The PT denies any adverse reactions to current medications since the last visit.  Bilateral hand painover thenar eminences and distal wrist, reported as new and being evaluated by Pain center  ROS Denies recent fever or chills. Denies sinus pressure, nasal congestion, ear pain or sore throat. Denies chest congestion, productive cough or wheezing. Denies chest pains, palpitations and leg swelling Denies abdominal pain, nausea, vomiting,diarrhea or constipation.   Denies dysuria, frequency, hesitancy or incontinence. C/o joint pain, swelling and limitation in mobility. Denies headaches, seizures, numbness, or tingling. Often feels overwhelmed and cries , but states not depressed though she misses her parents long deceased, is very thankful for all love that comes her way Denies any falls . Denies skin break down or rash.   PE  BP 122/67   Pulse 97   Temp 98.2 F (36.8 C) (Temporal)   Resp 15   Ht 5\' 2"  (1.575 m)   Wt 170 lb (77.1 kg)   SpO2 98%   BMI 31.09 kg/m   Patient alert and oriented and in no cardiopulmonary distress.  HEENT: No facial asymmetry, EOMI,     Neck decreased ROM.  Chest: Clear to auscultation bilaterally.  CVS: S1, S2 no murmurs, no S3.Regular rate.  ABD: Soft non tender.   Ext: No edema  MS: Decreased ROM spine, shoulders, hips and knees.  Skin: Intact, no ulcerations or rash noted.  Psych: Good eye contact, normal affect, but easily tearful Memory intact not anxious or depressed appearing.  CNS: CN 2-12 intact, power,   normal throughout.no focal deficits noted.   Assessment & Plan  Essential hypertension Controlled, no change in medication DASH diet and commitment to daily physical activity for a minimum of 30 minutes discussed and encouraged, as a part of hypertension management. The importance of attaining a healthy weight is also discussed.  BP/Weight 02/20/2020 12/24/2019 10/03/2019 09/26/2019 09/05/2019 06/28/2019 2/40/9735  Systolic BP 329 924 268 341 962 229 798  Diastolic BP 67 78 78 86 86 82 80  Wt. (Lbs) 170 159 - 159 159.8 161 158  BMI 31.09 31.05 - 31.05 31.21 31.44 27.99       Hyperlipidemia Hyperlipidemia:Low fat diet discussed and encouraged.   Lipid Panel  Lab Results  Component Value Date   CHOL 175 12/20/2019   HDL 57 12/20/2019   LDLCALC 97 12/20/2019   TRIG 109 12/20/2019   CHOLHDL 3.1 12/20/2019     Controlled, no change in medication Updated lab needed at/ before next visit.   Back pain of lumbar region with sciatica Chronic, severe and debilitating , managed through pain clinic  Overweight (BMI 25.0-29.9)  Patient re-educated about  the importance of commitment to a  minimum of 150 minutes of exercise per week as able.  The importance of healthy food choices with portion control discussed, as well as eating regularly and within a 12 hour window most days. The need to choose "clean , green" food 50 to 75% of the time is discussed, as well as to make water the primary drink and set a goal  of 64 ounces water daily.    Weight /BMI 02/20/2020 12/24/2019 09/26/2019  WEIGHT 170 lb 159 lb 159 lb  HEIGHT 5\' 2"  5\' 0"  5\' 0"   BMI 31.09 kg/m2 31.05 kg/m2 31.05 kg/m2      Hypothyroid Controlled, no change in medication Updated lab needed at/ before next visit.

## 2020-02-21 ENCOUNTER — Telehealth: Payer: Self-pay

## 2020-02-21 NOTE — Telephone Encounter (Signed)
That has been the plan

## 2020-02-21 NOTE — Telephone Encounter (Signed)
Pt is calling to say please wait on the hand issue, she is going back to a dr that took xrays on July 5, she will let us know what happens after that

## 2020-02-21 NOTE — Telephone Encounter (Signed)
FYI

## 2020-02-22 ENCOUNTER — Encounter: Payer: Self-pay | Admitting: Family Medicine

## 2020-02-22 NOTE — Assessment & Plan Note (Signed)
  Patient re-educated about  the importance of commitment to a  minimum of 150 minutes of exercise per week as able.  The importance of healthy food choices with portion control discussed, as well as eating regularly and within a 12 hour window most days. The need to choose "clean , green" food 50 to 75% of the time is discussed, as well as to make water the primary drink and set a goal of 64 ounces water daily.    Weight /BMI 02/20/2020 12/24/2019 09/26/2019  WEIGHT 170 lb 159 lb 159 lb  HEIGHT 5\' 2"  5\' 0"  5\' 0"   BMI 31.09 kg/m2 31.05 kg/m2 31.05 kg/m2

## 2020-02-22 NOTE — Assessment & Plan Note (Signed)
Chronic, severe and debilitating , managed through pain clinic

## 2020-02-22 NOTE — Assessment & Plan Note (Signed)
Controlled, no change in medication Updated lab needed at/ before next visit.  

## 2020-02-22 NOTE — Assessment & Plan Note (Signed)
Controlled, no change in medication DASH diet and commitment to daily physical activity for a minimum of 30 minutes discussed and encouraged, as a part of hypertension management. The importance of attaining a healthy weight is also discussed.  BP/Weight 02/20/2020 12/24/2019 10/03/2019 09/26/2019 09/05/2019 06/28/2019 8/75/7972  Systolic BP 820 601 561 537 943 276 147  Diastolic BP 67 78 78 86 86 82 80  Wt. (Lbs) 170 159 - 159 159.8 161 158  BMI 31.09 31.05 - 31.05 31.21 31.44 27.99

## 2020-02-22 NOTE — Assessment & Plan Note (Signed)
Hyperlipidemia:Low fat diet discussed and encouraged.   Lipid Panel  Lab Results  Component Value Date   CHOL 175 12/20/2019   HDL 57 12/20/2019   LDLCALC 97 12/20/2019   TRIG 109 12/20/2019   CHOLHDL 3.1 12/20/2019     Controlled, no change in medication Updated lab needed at/ before next visit.

## 2020-03-05 ENCOUNTER — Ambulatory Visit: Payer: Medicare HMO | Admitting: Family Medicine

## 2020-04-21 ENCOUNTER — Other Ambulatory Visit: Payer: Self-pay

## 2020-04-21 ENCOUNTER — Ambulatory Visit (INDEPENDENT_AMBULATORY_CARE_PROVIDER_SITE_OTHER): Payer: Medicare HMO | Admitting: Ophthalmology

## 2020-04-21 ENCOUNTER — Encounter (INDEPENDENT_AMBULATORY_CARE_PROVIDER_SITE_OTHER): Payer: Self-pay | Admitting: Ophthalmology

## 2020-04-21 DIAGNOSIS — H2511 Age-related nuclear cataract, right eye: Secondary | ICD-10-CM

## 2020-04-21 DIAGNOSIS — H25012 Cortical age-related cataract, left eye: Secondary | ICD-10-CM | POA: Diagnosis not present

## 2020-04-21 DIAGNOSIS — H25011 Cortical age-related cataract, right eye: Secondary | ICD-10-CM | POA: Diagnosis not present

## 2020-04-21 DIAGNOSIS — H33101 Unspecified retinoschisis, right eye: Secondary | ICD-10-CM | POA: Diagnosis not present

## 2020-04-21 DIAGNOSIS — H2512 Age-related nuclear cataract, left eye: Secondary | ICD-10-CM | POA: Insufficient documentation

## 2020-04-21 NOTE — Assessment & Plan Note (Signed)
The nature of cataract was discussed with the patient as well as the elective nature of surgery. The patient was reassured that surgery at a later date does not put the patient at risk for a worse outcome. It was emphasized that the need for surgery is dictated by the patient's quality of life as influenced by the cataract. Patient was instructed to maintain close follow up with their general eye care doctor.  Cataract(s) account for the patient's complaint. I discussed the risks and benefits of cataract surgery. Options were explained to the patient. The patient understands that new glasses may not improve their vision and desires to have cataract surgery. I have recommended follow up with their general eye care doctor for evaluation and consideration of cataract extraction with new intraocular lens insertion.

## 2020-04-21 NOTE — Patient Instructions (Signed)
I do recommend cataract evaluation for [name] as the patient has best corrected 20/50 essentially.  The nuclear sclerotic changes are likely a factor functioning OU, and yet she doesn't have any improvement in functioning refraction alone

## 2020-04-21 NOTE — Progress Notes (Signed)
04/21/2020     CHIEF COMPLAINT Patient presents for Retina Follow Up   HISTORY OF PRESENT ILLNESS: [name] is a 86 y.o. 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  Severity is moderate.  Duration of 6 months.  Since onset it is stable.  I, the attending physician,  performed the HPI with the patient and updated documentation appropriately.          Comments    6 Month f\u OU. OCT  Pt states she sometimes gets a film over vision.       Last edited by Tilda Franco on 04/21/2020 10:11 AM. (History)      Referring physician: Fayrene Helper, MD [address] [place],  [place] [address]  HISTORICAL INFORMATION:   Selected notes from the MEDICAL RECORD NUMBER    Lab Results  Component Value Date   HGBA1C 5.4 10/03/2018     CURRENT MEDICATIONS: No current outpatient medications on file. (Ophthalmic Drugs)   No current facility-administered medications for this visit. (Ophthalmic Drugs)   Current Outpatient Medications (Other)  Medication Sig  . amLODipine (NORVASC) 10 MG tablet TAKE 1 TABLET(10 MG) BY MOUTH DAILY  . Ascorbic Acid (VITAMIN C) 500 MG tablet Take 500 mg by mouth daily.   Marland Kitchen aspirin EC 81 MG tablet Take 81 mg by mouth daily.  Marland Kitchen atorvastatin (LIPITOR) 40 MG tablet TAKE 1 TABLET(40 MG) BY MOUTH DAILY AT 6 PM (Patient taking differently: Take 40 mg by mouth every evening. )  . calcium carbonate (OS-CAL - DOSED IN MG OF ELEMENTAL CALCIUM) 1250 MG tablet Take 1 tablet by mouth daily.  . Cyanocobalamin (B-12) 1000 MCG CAPS Take 1 capsule by mouth daily.  . diclofenac sodium (VOLTAREN) 1 % GEL Apply 2 g topically 2 (two) times daily as needed (for back pain).  Marland Kitchen EPINEPHrine (EPIPEN 2-PAK) 0.3 mg/0.3 mL IJ SOAJ injection inject as directed for SEVERE ALLERGIC REACTIONS (Patient taking differently: Inject 0.3 mg into the muscle See admin instructions. inject as  directed for SEVERE ALLERGIC REACTIONS)  . famotidine (PEPCID) 20 MG tablet Take 1 tablet (20 mg total) by mouth 2 (two) times daily.  . fluticasone (FLONASE) 50 MCG/ACT nasal spray SHAKE LIQUID AND USE 1 SPRAY IN EACH NOSTRIL DAILY (Patient taking differently: Place 1 spray into both nostrils daily as needed for allergies. )  . hydrochlorothiazide (MICROZIDE) 12.5 MG capsule TAKE 1 CAPSULE(12.5 MG) BY MOUTH DAILY  . HYDROcodone-acetaminophen (NORCO) 7.5-325 MG tablet Take 1 tablet by mouth daily as needed for moderate pain. (Patient taking differently: Take 1 tablet by mouth 2 (two) times daily. )  . isosorbide mononitrate (IMDUR) 30 MG 24 hr tablet Take 1 tablet (30 mg total) by mouth daily.  Marland Kitchen levothyroxine (SYNTHROID) 25 MCG tablet TAKE 1 TABLET BY MOUTH EVERY MORNING  . Multiple Vitamin (MULITIVITAMIN WITH MINERALS) TABS Take 1 tablet by mouth daily.  . nitroGLYCERIN (NITROLINGUAL) 0.4 MG/SPRAY spray Place 1 spray under the tongue every 5 (five) minutes x 3 doses as needed for chest pain.  . potassium gluconate 595 (99 K) MG TABS tablet Take 1 tablet (595 mg total) by mouth every evening.   No current facility-administered medications for this visit. (Other)      REVIEW OF SYSTEMS:    ALLERGIES Allergies  Allergen Reactions  . Ace Inhibitors Anaphylaxis    Was on  ventilator for six days due to airway swelling shut while on ACE inhibitors.  . Beef-Derived Products Shortness Of Breath, Diarrhea and Swelling  . Bupropion Hcl Anaphylaxis  . Codeine Anaphylaxis  . Penicillins Anaphylaxis    Has patient had a PCN reaction causing immediate rash, facial/tongue/throat swelling, SOB or lightheadedness with hypotension: Yes Has patient had a PCN reaction causing severe rash involving mucus membranes or skin necrosis: No Has patient had a PCN reaction that required hospitalization No Has patient had a PCN reaction occurring within the last 10 years: No If all of the above answers are "NO",  then may proceed with Cephalosporin use.   . Angiotensin Receptor Blockers Other (See Comments)    Patient unable to recall what medication this might have been and/or her reaction to it.  . Bee Venom     Facial swelling    . Lambs Quarters     Allergen testing  . Pork-Derived Products     Due to tick bite    PAST MEDICAL HISTORY Past Medical History:  Diagnosis Date  . Anemia   . Blood clot associated with vein wall inflammation    hx of in right leg  . CAD (coronary artery disease)   . Carpal tunnel syndrome on left    Left Hand pain with numbness, new onset   . Chronic back pain   . Chronic back pain    buldging disc  . Constipation   . Diarrhea   . Difficult intubation    small airway  . DJD (degenerative joint disease)    Severe  . Gastric ulcer    hx of   . GERD (gastroesophageal reflux disease)    takes Omeprazole daily  . Headache(784.0)    occasionally  . Hyperlipidemia    takes Lipitor nightly  . Hypertension    takes Amlodipine and Metoprolol daily  . Nocturia   . Obesity   . Pneumonia    hx of-in 2011  . Shortness of breath    with exertion  . Sleep apnea    sleep study done 76yrs ago  . Thyroid mass    right   Past Surgical History:  Procedure Laterality Date  . ABDOMINAL HYSTERECTOMY    . APPENDECTOMY  1987  . Arthoscopy left knee  1992  . Arthroscopy left shoulder  1999  . Back  Surgery  lumbar  1989 /1999   x 2  . BACK SURGERY    . CARDIAC CATHETERIZATION  05/2010  . CARDIAC CATHETERIZATION N/A 09/30/2015   Procedure: Left Heart Cath and Coronary Angiography;  Surgeon: Troy Sine, MD;  Location: [facility];  Service: Cardiovascular;  Laterality: N/A;  . Carpal tunnel release right     x 2  . CHOLECYSTECTOMY    . COLONOSCOPY    . ESOPHAGOGASTRODUODENOSCOPY    . LUMBAR FUSION  March 19, 2014  . NM MYOCAR PERF WALL MOTION  11/12/2008   no ischemia  . PARTIAL HYSTERECTOMY  1972  . ROTATOR CUFF REPAIR     right  . SPINE  SURGERY    . THYROIDECTOMY  11/17/2011   Procedure: THYROIDECTOMY;  Surgeon: Ascencion Dike, MD;  Location: [facility];  Service: ENT;  Laterality: Right;  WITH FROZEN SECTION  . TONSILLECTOMY      FAMILY HISTORY Family History  Problem Relation Age of Onset  . Anuerysm Mother        died at 34  . Stroke Father  98 at death   . Anesthesia problems Neg Hx   . Hypotension Neg Hx   . Malignant hyperthermia Neg Hx   . Pseudochol deficiency Neg Hx   . Allergic rhinitis Neg Hx   . Angioedema Neg Hx   . Asthma Neg Hx   . Atopy Neg Hx   . Eczema Neg Hx   . Immunodeficiency Neg Hx   . Urticaria Neg Hx     SOCIAL HISTORY Social History   Tobacco Use  . Smoking status: Former Smoker    Types: Cigarettes    Quit date: 09/06/1984    Years since quitting: 35.6  . Smokeless tobacco: Never Used  Vaping Use  . Vaping Use: Never used  Substance Use Topics  . Alcohol use: No    Comment: Casual  . Drug use: No         OPHTHALMIC EXAM:  Base Eye Exam    Visual Acuity (Snellen - Linear)      Right Left   Dist cc 20/60 + 20/50   Dist ph cc NI 20/50 +   Correction: Glasses       Tonometry (Tonopen, 10:16 AM)      Right Left   Pressure 15 11       Pupils      Pupils Dark Light Shape React APD   Right PERRL 3 3 Round Minimal None   Left PERRL 3 3 Round Minimal None       Visual Fields (Counting fingers)      Left Right    Full Full       Neuro/Psych    Oriented x3: Yes   Mood/Affect: Normal       Dilation    Both eyes: 1.0% Mydriacyl, 2.5% Phenylephrine @ 10:16 AM        Slit Lamp and Fundus Exam    External Exam      Right Left   External Normal Normal       Slit Lamp Exam      Right Left   Lids/Lashes Normal Normal   Conjunctiva/Sclera White and quiet White and quiet   Cornea Clear Clear   Anterior Chamber Deep and quiet Deep and quiet   Iris Round and reactive Round and reactive   Lens 3+ Nuclear sclerosis, 2+ Cortical cataract 3+ Nuclear sclerosis,  2+ Cortical cataract   Anterior Vitreous Normal Normal       Fundus Exam      Right Left   Posterior Vitreous Posterior vitreous detachment Posterior vitreous detachment   Disc Normal Normal   C/D Ratio 0.4 0.4   Macula Pseudocystoid change, no macular hole Normal   Vessels Normal Normal   Periphery Normal Normal          IMAGING AND PROCEDURES  Imaging and Procedures for 04/21/20  OCT, Retina - OU - Both Eyes       Right Eye Quality was good. Scan locations included subfoveal. Central Foveal Thickness: 356.   Left Eye Quality was good. Scan locations included subfoveal. Central Foveal Thickness: 270. Progression has been stable. Findings include normal observations.   Notes Foveal macular schisis OD, unchanged over time.                ASSESSMENT/PLAN:  Age-related nuclear cataract of left eye The nature of cataract was discussed with the patient as well as the elective nature of surgery. The patient was reassured that surgery at a later date does not put the patient  at risk for a worse outcome. It was emphasized that the need for surgery is dictated by the patient's quality of life as influenced by the cataract. Patient was instructed to maintain close follow up with their general eye care doctor.  Cataract(s) account for the patient's complaint. I discussed the risks and benefits of cataract surgery. Options were explained to the patient. The patient understands that new glasses may not improve their vision and desires to have cataract surgery. I have recommended follow up with their general eye care doctor for evaluation and consideration of cataract extraction with new intraocular lens insertion.      ICD-10-CM   1. Retinoschisis, right  H33.101 OCT, Retina - OU - Both Eyes  2. Cortical age-related cataract of right eye  H25.011   3. Cortical age-related cataract of left eye  H25.012   4. Age-related nuclear cataract of right eye  H25.11   5. Age-related  nuclear cataract of left eye  H25.12     1.  We'll schedule cataract evaluation in the near future with local surgeon  2.  3.  Ophthalmic Meds Ordered this visit:  No orders of the defined types were placed in this encounter.      Return in about 6 months (around 10/22/2020) for dilate, OCT.  Patient Instructions  I do recommend cataract evaluation for HI as the patient has best corrected 20/50 essentially.  The nuclear sclerotic changes are likely a factor functioning OU, and yet she doesn't have any improvement in functioning refraction alone    Explained the diagnoses, plan, and follow up with the patient and they expressed understanding.  Patient expressed understanding of the importance of proper follow up care.   Clent Demark Sarahjane Matherly M.D. Diseases & Surgery of the Retina and Vitreous [facility] 04/21/20     Abbreviations: M myopia (nearsighted); A astigmatism; H hyperopia (farsighted); P presbyopia; Mrx spectacle prescription;  CTL contact lenses; OD right eye; OS left eye; OU both eyes  XT exotropia; ET esotropia; PEK punctate epithelial keratitis; PEE punctate epithelial erosions; DES dry eye syndrome; MGD meibomian gland dysfunction; ATs artificial tears; PFAT's preservative free artificial tears; [place] nuclear sclerotic cataract; PSC posterior subcapsular cataract; ERM epi-retinal membrane; PVD posterior vitreous detachment; RD retinal detachment; DM diabetes mellitus; DR diabetic retinopathy; NPDR non-proliferative diabetic retinopathy; PDR proliferative diabetic retinopathy; CSME clinically significant macular edema; DME diabetic macular edema; dbh dot blot hemorrhages; CWS cotton wool spot; POAG primary open angle glaucoma; C/D cup-to-disc ratio; HVF humphrey visual field; GVF goldmann visual field; OCT optical coherence tomography; IOP intraocular pressure; BRVO Branch retinal vein occlusion; CRVO central retinal vein occlusion; CRAO central retinal artery  occlusion; BRAO branch retinal artery occlusion; RT retinal tear; SB scleral buckle; PPV pars plana vitrectomy; VH Vitreous hemorrhage; PRP panretinal laser photocoagulation; IVK intravitreal kenalog; VMT vitreomacular traction; MH Macular hole;  NVD neovascularization of the disc; NVE neovascularization elsewhere; AREDS age related eye disease study; ARMD age related macular degeneration; POAG primary open angle glaucoma; EBMD epithelial/anterior basement membrane dystrophy; ACIOL anterior chamber intraocular lens; IOL intraocular lens; PCIOL posterior chamber intraocular lens; Phaco/IOL phacoemulsification with intraocular lens placement; [place] photorefractive keratectomy; LASIK laser assisted in situ keratomileusis; HTN hypertension; DM diabetes mellitus; COPD chronic obstructive pulmonary disease

## 2020-05-13 ENCOUNTER — Other Ambulatory Visit: Payer: Self-pay | Admitting: Allergy & Immunology

## 2020-05-13 ENCOUNTER — Other Ambulatory Visit: Payer: Self-pay

## 2020-05-13 MED ORDER — EPINEPHRINE 0.3 MG/0.3ML IJ SOAJ: 0.3000 mg | INTRAMUSCULAR | 0 refills | Status: DC

## 2020-05-14 ENCOUNTER — Telehealth: Payer: Self-pay

## 2020-05-14 ENCOUNTER — Other Ambulatory Visit: Payer: Self-pay | Admitting: Family Medicine

## 2020-05-14 NOTE — Telephone Encounter (Signed)
[name], the grandson (on dpr) said she went to the pain management dr yesterday and took her bp and it was 108/80 and the nurse told her it was low. I told him it was actually really good but he said since they told her it was low he wanted to let you know what you think/advise.

## 2020-05-14 NOTE — Telephone Encounter (Signed)
If not light headed stay on same meds, the number is good, will check at her office viosit later this month, can arr ange sooner appt if feels light headed just or blood pressure eval and she needs to bring meds to visit

## 2020-05-15 ENCOUNTER — Telehealth: Payer: Self-pay | Admitting: Allergy & Immunology

## 2020-05-15 ENCOUNTER — Other Ambulatory Visit: Payer: Self-pay | Admitting: *Deleted

## 2020-05-15 MED ORDER — EPINEPHRINE 0.3 MG/0.3ML IJ SOAJ: INTRAMUSCULAR | 1 refills | Status: AC

## 2020-05-15 NOTE — Telephone Encounter (Signed)
Patient aware of recommendation with verbal understanding.  °

## 2020-05-15 NOTE — Telephone Encounter (Signed)
New prescription has been sent in. Called and advised patient's grandson. Patient's grandson verbalized understanding.

## 2020-05-15 NOTE — Telephone Encounter (Signed)
Patient's grandson, [name], states [facility] does not have Epi Pen that was called in 9/7.   Please advise.

## 2020-05-29 ENCOUNTER — Encounter (HOSPITAL_COMMUNITY): Payer: Self-pay

## 2020-05-29 ENCOUNTER — Emergency Department (HOSPITAL_COMMUNITY): Payer: Medicare HMO

## 2020-05-29 ENCOUNTER — Other Ambulatory Visit: Payer: Self-pay

## 2020-05-29 ENCOUNTER — Emergency Department (HOSPITAL_COMMUNITY)
Admission: EM | Admit: 2020-05-29 | Discharge: 2020-05-29 | Disposition: A | Discharge status: Home / Self Care | Payer: Medicare HMO | Attending: Emergency Medicine | Admitting: Emergency Medicine

## 2020-05-29 DIAGNOSIS — S61212A Laceration without foreign body of right middle finger without damage to nail, initial encounter: Secondary | ICD-10-CM

## 2020-05-29 DIAGNOSIS — Y9389 Activity, other specified: Secondary | ICD-10-CM | POA: Insufficient documentation

## 2020-05-29 DIAGNOSIS — E039 Hypothyroidism, unspecified: Secondary | ICD-10-CM | POA: Insufficient documentation

## 2020-05-29 DIAGNOSIS — Z7982 Long term (current) use of aspirin: Secondary | ICD-10-CM | POA: Insufficient documentation

## 2020-05-29 DIAGNOSIS — Z87891 Personal history of nicotine dependence: Secondary | ICD-10-CM | POA: Diagnosis not present

## 2020-05-29 DIAGNOSIS — I2510 Atherosclerotic heart disease of native coronary artery without angina pectoris: Secondary | ICD-10-CM | POA: Insufficient documentation

## 2020-05-29 DIAGNOSIS — Z79890 Hormone replacement therapy (postmenopausal): Secondary | ICD-10-CM | POA: Insufficient documentation

## 2020-05-29 DIAGNOSIS — Z79899 Other long term (current) drug therapy: Secondary | ICD-10-CM | POA: Diagnosis not present

## 2020-05-29 DIAGNOSIS — Y9289 Other specified places as the place of occurrence of the external cause: Secondary | ICD-10-CM | POA: Insufficient documentation

## 2020-05-29 DIAGNOSIS — Z23 Encounter for immunization: Secondary | ICD-10-CM | POA: Diagnosis not present

## 2020-05-29 DIAGNOSIS — W28XXXA Contact with powered lawn mower, initial encounter: Secondary | ICD-10-CM | POA: Diagnosis not present

## 2020-05-29 DIAGNOSIS — I10 Essential (primary) hypertension: Secondary | ICD-10-CM | POA: Diagnosis not present

## 2020-05-29 MED ORDER — HYDROCODONE-ACETAMINOPHEN 5-325 MG PO TABS
1.0000 | ORAL_TABLET | Freq: Once | ORAL | Status: AC
  Administered 2020-05-29: 1 via ORAL
  Filled 2020-05-29: qty 1

## 2020-05-29 MED ORDER — CLINDAMYCIN HCL 150 MG PO CAPS
300.0000 mg | ORAL_CAPSULE | Freq: Once | ORAL | Status: AC
  Administered 2020-05-29: 300 mg via ORAL
  Filled 2020-05-29: qty 2

## 2020-05-29 MED ORDER — TETANUS-DIPHTH-ACELL PERTUSSIS 5-2.5-18.5 LF-MCG/0.5 IM SUSP
0.5000 mL | Freq: Once | INTRAMUSCULAR | Status: AC
  Administered 2020-05-29: 0.5 mL via INTRAMUSCULAR
  Filled 2020-05-29: qty 0.5

## 2020-05-29 MED ORDER — CLINDAMYCIN HCL 150 MG PO CAPS: 300.0000 mg | ORAL_CAPSULE | Freq: Three times a day (TID) | ORAL | 0 refills | Status: AC

## 2020-05-29 MED ORDER — BUPIVACAINE HCL (PF) 0.5 % IJ SOLN
5.0000 mL | Freq: Once | INTRAMUSCULAR | Status: AC
  Administered 2020-05-29: 5 mL
  Filled 2020-05-29: qty 30

## 2020-05-29 MED ORDER — LIDOCAINE HCL (PF) 1 % IJ SOLN
5.0000 mL | Freq: Once | INTRAMUSCULAR | Status: AC
  Administered 2020-05-29: 5 mL
  Filled 2020-05-29: qty 30

## 2020-05-29 NOTE — ED Triage Notes (Signed)
Pt states that she was riding a lawnmower and looked down and her right middle finger was bleeding, finger has laceration noted to posterior side of finger, dressing applied,

## 2020-05-29 NOTE — ED Notes (Signed)
Patient transported to X-ray 

## 2020-05-29 NOTE — ED Provider Notes (Signed)
[facility] EMERGENCY DEPARTMENT Provider Note   CSN: [id number] Arrival date & time: 05/29/20  1928     History Chief Complaint  Patient presents with  . Laceration    [name] is a 86 y.o. female with a past medical history of chronic back pain, CAD, hypothyroid, who presents today for evaluation of pain and bleeding. She reports that she was outside on the riding lawnmower when she suddenly felt a sharp pain in her right long finger and when she looked it was bleeding. She is unsure what actually cut her or how she got caught. She denies any other injuries. She does not take any anticoagulants. She denies any fevers. She reports paresthesias on the tip of the affected finger. This occurred shortly prior to arrival. She is requesting pain medication. She denies any recent sickness or illness.  She is unsure when her last tetanus shot was. Chart review shows it was in 2014.  HPI     Past Medical History:  Diagnosis Date  . Anemia   . Blood clot associated with vein wall inflammation    hx of in right leg  . CAD (coronary artery disease)   . Carpal tunnel syndrome on left    Left Hand pain with numbness, new onset   . Chronic back pain   . Chronic back pain    buldging disc  . Constipation   . Diarrhea   . Difficult intubation    small airway  . DJD (degenerative joint disease)    Severe  . Gastric ulcer    hx of   . GERD (gastroesophageal reflux disease)    takes Omeprazole daily  . Headache(784.0)    occasionally  . Hyperlipidemia    takes Lipitor nightly  . Hypertension    takes Amlodipine and Metoprolol daily  . Nocturia   . Obesity   . Pneumonia    hx of-in 2011  . Shortness of breath    with exertion  . Sleep apnea    sleep study done 36yrs ago  . Thyroid mass    right    Patient Active Problem List   Diagnosis Date Noted  . 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 Cortical age-related cataract of right eye 04/21/2020  . Cortical age-related  cataract of left eye 04/21/2020  . Age-related nuclear cataract of right eye 04/21/2020  . Age-related nuclear cataract of left eye 04/21/2020  . Angioedema 02/02/2019  . Seafood allergy 01/31/2017  . Overweight (BMI 25.0-29.9) 10/12/2016  . CAD in native artery   . Osteoarthritis 04/17/2013  . Coronary vasospasm ([place]) 02/07/2013  . Hypothyroid 08/14/2012  . Prediabetes 10/10/2011  . CARPAL TUNNEL SYNDROME, BILATERAL 02/19/2010  . GERD 09/15/2009  . Coronary atherosclerosis 10/01/2008  . DYSPEPSIA 07/07/2008  . Back pain of lumbar region with sciatica 07/03/2008  . Hyperlipidemia 11/24/2007  . Essential hypertension 11/24/2007    Past Surgical History:  Procedure Laterality Date  . ABDOMINAL HYSTERECTOMY    . APPENDECTOMY  1987  . Arthoscopy left knee  1992  . Arthroscopy left shoulder  1999  . Back  Surgery  lumbar  1989 /1999   x 2  . BACK SURGERY    . CARDIAC CATHETERIZATION  05/2010  . CARDIAC CATHETERIZATION N/A 09/30/2015   Procedure: Left Heart Cath and Coronary Angiography;  Surgeon: Troy Sine, MD;  Location: [place] [facility];  Service: Cardiovascular;  Laterality: N/A;  . Carpal tunnel release right     x 2  .  CHOLECYSTECTOMY    . COLONOSCOPY    . ESOPHAGOGASTRODUODENOSCOPY    . LUMBAR FUSION  March 19, 2014  . NM MYOCAR PERF WALL MOTION  11/12/2008   no ischemia  . PARTIAL HYSTERECTOMY  1972  . ROTATOR CUFF REPAIR     right  . SPINE SURGERY    . THYROIDECTOMY  11/17/2011   Procedure: THYROIDECTOMY;  Surgeon: Ascencion Dike, MD;  Location: [facility];  Service: ENT;  Laterality: Right;  WITH FROZEN SECTION  . TONSILLECTOMY       OB History   No obstetric history on file.     Family History  Problem Relation Age of Onset  . Anuerysm Mother        died at 41  . Stroke Father        90 at death   . Anesthesia problems Neg Hx   . Hypotension Neg Hx   . Malignant hyperthermia Neg Hx   . Pseudochol deficiency Neg Hx   . Allergic rhinitis Neg Hx   .  Angioedema Neg Hx   . Asthma Neg Hx   . Atopy Neg Hx   . Eczema Neg Hx   . Immunodeficiency Neg Hx   . Urticaria Neg Hx     Social History   Tobacco Use  . Smoking status: Former Smoker    Types: Cigarettes    Quit date: 09/06/1984    Years since quitting: 35.7  . Smokeless tobacco: Never Used  Vaping Use  . Vaping Use: Never used  Substance Use Topics  . Alcohol use: No    Comment: Casual  . Drug use: No    Home Medications Prior to Admission medications   Medication Sig Start Date End Date Taking? Authorizing Provider  amLODipine (NORVASC) 10 MG tablet TAKE 1 TABLET(10 MG) BY MOUTH DAILY 02/12/20   Fayrene Helper, MD  Ascorbic Acid (VITAMIN C) 500 MG tablet Take 500 mg by mouth daily.     [provider]  aspirin EC 81 MG tablet Take 81 mg by mouth daily.    [provider]  atorvastatin (LIPITOR) 40 MG tablet TAKE 1 TABLET(40 MG) BY MOUTH DAILY AT 6 PM Patient taking differently: Take 40 mg by mouth every evening.  03/25/19   Perlie Mayo, NP  calcium carbonate (OS-CAL - DOSED IN MG OF ELEMENTAL CALCIUM) 1250 MG tablet Take 1 tablet by mouth daily.    [provider]  clindamycin (CLEOCIN) 150 MG capsule Take 2 capsules (300 mg total) by mouth 3 (three) times daily for 7 days. 05/29/20 06/05/20  Lorin Glass, PA-C  Cyanocobalamin (B-12) 1000 MCG CAPS Take 1 capsule by mouth daily.    [provider]  diclofenac sodium (VOLTAREN) 1 % GEL Apply 2 g topically 2 (two) times daily as needed (for back pain). 02/03/19   Johnson, Clanford L, MD  EPINEPHrine 0.3 mg/0.3 mL IJ SOAJ injection INJECT INTO THE MUSCLE AS NEEDED 05/15/20   Valentina Shaggy, MD  famotidine (PEPCID) 20 MG tablet Take 1 tablet (20 mg total) by mouth 2 (two) times daily. 04/10/19   Milton Ferguson, MD  fluticasone (FLONASE) 50 MCG/ACT nasal spray SHAKE LIQUID AND USE 1 SPRAY IN EACH NOSTRIL DAILY Patient taking differently: Place 1 spray into both nostrils daily as  needed for allergies.  03/25/19   Perlie Mayo, NP  hydrochlorothiazide (MICROZIDE) 12.5 MG capsule TAKE 1 CAPSULE(12.5 MG) BY MOUTH DAILY 10/22/19   Fayrene Helper, MD  HYDROcodone-acetaminophen (NORCO) 7.5-325 MG tablet Take 1 tablet by mouth daily as needed for moderate pain. Patient taking differently: Take 1 tablet by mouth 2 (two) times daily.  11/14/17   Fayrene Helper, MD  isosorbide mononitrate (IMDUR) 30 MG 24 hr tablet Take 1 tablet (30 mg total) by mouth daily. 10/01/19   Croitoru, Mihai, MD  levothyroxine (SYNTHROID) 25 MCG tablet TAKE 1 TABLET BY MOUTH EVERY MORNING 05/14/20   Fayrene Helper, MD  Multiple Vitamin (MULITIVITAMIN WITH MINERALS) TABS Take 1 tablet by mouth daily.    [provider]  nitroGLYCERIN (NITROLINGUAL) 0.4 MG/SPRAY spray Place 1 spray under the tongue every 5 (five) minutes x 3 doses as needed for chest pain. 11/30/19   Croitoru, Mihai, MD  potassium gluconate 595 (99 K) MG TABS tablet Take 1 tablet (595 mg total) by mouth every evening. 02/13/20   Fayrene Helper, MD    Allergies    Ace inhibitors, Beef-derived products, Bupropion hcl, Codeine, Penicillins, Angiotensin receptor blockers, Bee venom, Lambs quarters, and Pork-derived products  Review of Systems   Review of Systems  Constitutional: Negative for chills and fever.  Respiratory: Negative for shortness of breath.   Cardiovascular: Negative for chest pain.  Gastrointestinal: Negative for abdominal pain.  Musculoskeletal: Positive for back pain (Chronic, unchanged).  Skin: Positive for wound.  Neurological: Negative for weakness and headaches.  All other systems reviewed and are negative.   Physical Exam Updated Vital Signs BP 132/75   Pulse 75   Temp 98.8 F (37.1 C)   Resp 20   Ht 5\' 2"  (1.575 m)   Wt 72.6 kg   SpO2 98%   BMI 29.26 kg/m   Physical Exam Vitals and nursing note reviewed.  Constitutional:      General: She is not in acute distress.     Appearance: She is well-developed. She is not diaphoretic.  HENT:     Head: Normocephalic and atraumatic.  Eyes:     General: No scleral icterus.       Right eye: No discharge.        Left eye: No discharge.     Conjunctiva/sclera: Conjunctivae normal.  Cardiovascular:     Rate and Rhythm: Normal rate and regular rhythm.     Pulses: Normal pulses.  Pulmonary:     Effort: Pulmonary effort is normal. No respiratory distress.     Breath sounds: No stridor.  Abdominal:     General: There is no distension.  Musculoskeletal:        General: No deformity.     Cervical back: Normal range of motion.     Comments: Patient has full, pain-free active range of motion of the right long finger CMP, PIP joint. There is decreased strength with range of motion at the DIP joint however patient does have some degree of both active flexion and extension.  Skin:    General: Skin is warm and dry.     Capillary Refill: Capillary refill takes less than 2 seconds. Right long finger distal to wound    Comments: Please see clinical images. There are multiple connecting lacerations on the right long finger distal aspect. Wounds are on the palmar side. The wound is on the radial side primarily about 2 cm with two other 1 cm flaps medially.    Neurological:     Mental Status: She is alert.     Motor: No abnormal muscle tone.     Comments: Subjective decreased sensation to light touch  to the right distal long finger distal to the wound.  Psychiatric:        Mood and Affect: Mood normal.        Behavior: Behavior normal.         ED Results / Procedures / Treatments   Labs (all labs ordered are listed, but only abnormal results are displayed) Labs Reviewed - No data to display  EKG None  Radiology DG Finger Middle Right  Result Date: 05/29/2020 CLINICAL DATA:  Laceration, bleeding EXAM: RIGHT MIDDLE FINGER 2+V COMPARISON:  01/09/2020 FINDINGS: Frontal, oblique, and lateral views of the right third  digit are obtained. Soft tissue laceration volar aspect distal tuft of the third digit. No underlying fracture. No radiopaque foreign body. Stable osteoarthritis of the interphalangeal joints, greatest distally. IMPRESSION: 1. Soft tissue laceration volar aspect distal margin third digit. No fracture or radiopaque foreign body. Electronically Signed   By: Randa Ngo M.D.   On: 05/29/2020 21:27    Procedures .Marland KitchenLaceration Repair  Date/Time: 05/29/2020 11:01 PM Performed by: Lorin Glass, PA-C Authorized by: Lorin Glass, PA-C   Consent:    Consent obtained:  Verbal   Consent given by:  Patient   Risks discussed:  Infection, need for additional repair, poor cosmetic result, pain, retained foreign body, tendon damage, vascular damage, poor wound healing and nerve damage   Alternatives discussed:  No treatment and referral (Alternative wound closures) Anesthesia (see MAR for exact dosages):    Anesthesia method:  Nerve block   Block location:  Right long finger both webspaces   Block needle gauge:  25 G   Block anesthetic: 50-50 mixture of 1% lidocaine and 0.5% bupivacaine both without epi.   Block technique:  Webspace   Block injection procedure:  Anatomic landmarks identified, introduced needle, incremental injection, negative aspiration for blood and anatomic landmarks palpated   Block outcome:  Anesthesia achieved Laceration details:    Location:  Finger   Finger location:  R long finger   Length (cm):  4 Repair type:    Repair type:  Intermediate Pre-procedure details:    Preparation:  Patient was prepped and draped in usual sterile fashion and imaging obtained to evaluate for foreign bodies Exploration:    Hemostasis achieved with:  Tourniquet   Wound exploration: wound explored through full range of motion and entire depth of wound probed and visualized     Wound extent: areolar tissue violated     Wound extent: no foreign bodies/material noted, no underlying  fracture noted and no vascular damage noted   Treatment:    Area cleansed with:  Betadine and saline   Amount of cleaning:  Extensive   Irrigation solution:  Sterile saline Skin repair:    Repair method:  Sutures   Suture size:  4-0   Suture material:  Prolene   Suture technique:  Simple interrupted (and three corner suture)   Number of sutures:  7 Post-procedure details:    Dressing:  Non-adherent dressing, bulky dressing and sterile dressing   Patient tolerance of procedure:  Tolerated well, no immediate complications   (including critical care time)  Medications Ordered in ED Medications  HYDROcodone-acetaminophen (NORCO/VICODIN) 5-325 MG per tablet 1 tablet (1 tablet Oral Given 05/29/20 2041)  Tdap (BOOSTRIX) injection 0.5 mL (0.5 mLs Intramuscular Given 05/29/20 2236)  bupivacaine (MARCAINE) 0.5 % injection 5 mL (5 mLs Infiltration Given by Other 05/29/20 2237)  lidocaine (PF) (XYLOCAINE) 1 % injection 5 mL (5 mLs Infiltration Given by Other  05/29/20 2237)  clindamycin (CLEOCIN) capsule 300 mg (300 mg Oral Given 05/29/20 2258)    ED Course  I have reviewed the triage vital signs and the nursing notes.  Pertinent labs & imaging results that were available during my care of the patient were reviewed by me and considered in my medical decision making (see chart for details).    MDM Rules/Calculators/A&P                         Patient is a 86 year old woman who presents today for evaluation of a right finger laceration. She is right-hand dominant. She is unsure how the laceration occurred however she was on her riding lawnmower and felt the pain and noticed it was bleeding. Her tetanus is updated. She is given Vicodin while in the ER. She has subjective slight decrease sensation to light touch over the distal tip of the right long finger distal to the laceration, along with slightly decreased strength with active range of motion at the DIP joint on the affected finger, however does  still have active range of motion through the entire right long finger.  X-ray was obtained without fracture, radiopaque foreign body or other acute, unanticipated abnormalities.    Patient gave verbal consent for digital block and laceration repair which was performed, please see procedure note. Based on patient's penicillin allergy is she will be started on clindamycin. She is already in pain management therefore additional narcotics were not provided.  Recommended wound recheck in 2 days, return to PCP in 14 days for suture removal. Additionally she is referred to hand for follow-up given that she does have decreased strength at the DIP on that side raising concern for a partial/incomplete tendon injury.    Wound care discussed with patient and grandson.   Return precautions were discussed with patient who states their understanding.  At the time of discharge patient denied any unaddressed complaints or concerns.  Patient is agreeable for discharge home.  Note: Portions of this report may have been transcribed using voice recognition software. Every effort was made to ensure accuracy; however, inadvertent computerized transcription errors may be present   Final Clinical Impression(s) / ED Diagnoses Final diagnoses:  Laceration of right middle finger without foreign body without damage to nail, initial encounter    Rx / DC Orders ED Discharge Orders         Ordered    clindamycin (CLEOCIN) 150 MG capsule  3 times daily        05/29/20 2248           Lorin Glass, Hershal Coria 05/29/20 2306    Milton Ferguson, MD 06/01/20 0900

## 2020-05-29 NOTE — Discharge Instructions (Addendum)
Please leave your dressing in place for 2 days.  If it gets wet or dirty you may change it before hand.  Do not get your hand wet for 2 days.  After 2 days you may let clean water and gentle soap run over it however do not scrub at the wound.  Pat it dry and please keep it covered.  Do not soak or submerge her hand while you have sutures in.  I would recommend in the next 2 to 4 days seeing your primary care doctor for a wound check.  Today you received medications that may make you sleepy or impair your ability to make decisions.  For the next 24 hours please do not drive, operate heavy machinery, care for a small child with out another adult present, or perform any activities that may cause harm to you or someone else if you were to fall asleep or be impaired.   You may have diarrhea from the antibiotics.  It is very important that you continue to take the antibiotics even if you get diarrhea unless a medical professional tells you that you may stop taking them.  If you stop too early the bacteria you are being treated for will become stronger and you may need different, more powerful antibiotics that have more side effects and worsening diarrhea.  Please stay well hydrated and consider probiotics as they may decrease the severity of your diarrhea.

## 2020-06-03 ENCOUNTER — Encounter: Payer: Self-pay | Admitting: Family Medicine

## 2020-06-03 ENCOUNTER — Other Ambulatory Visit: Payer: Self-pay

## 2020-06-03 ENCOUNTER — Ambulatory Visit (INDEPENDENT_AMBULATORY_CARE_PROVIDER_SITE_OTHER): Payer: Medicare HMO | Admitting: Family Medicine

## 2020-06-03 VITALS — BP 106/70 | HR 92 | Resp 16 | Ht 63.0 in | Wt 162.0 lb

## 2020-06-03 DIAGNOSIS — Z23 Encounter for immunization: Secondary | ICD-10-CM

## 2020-06-03 DIAGNOSIS — I10 Essential (primary) hypertension: Secondary | ICD-10-CM

## 2020-06-03 DIAGNOSIS — Z0001 Encounter for general adult medical examination with abnormal findings: Secondary | ICD-10-CM

## 2020-06-03 DIAGNOSIS — Z0000 Encounter for general adult medical examination without abnormal findings: Secondary | ICD-10-CM

## 2020-06-03 DIAGNOSIS — R7989 Other specified abnormal findings of blood chemistry: Secondary | ICD-10-CM

## 2020-06-03 DIAGNOSIS — S61219A Laceration without foreign body of unspecified finger without damage to nail, initial encounter: Secondary | ICD-10-CM

## 2020-06-03 MED ORDER — CLINDAMYCIN HCL 300 MG PO CAPS: ORAL_CAPSULE | ORAL | 0 refills | Status: DC

## 2020-06-03 NOTE — Progress Notes (Signed)
    [name]     MRN: [id number]      DOB: [birth year]  HPI: Patient is in for annual physical exam. Laceration to 3rd right finger sustained this past week on 05/29/2020 is also addressed and she is referred to hand surgeon Recent labs,  are reviewed. Immunization is reviewed , and  updated    PE: BP 106/70   Pulse 92   Resp 16   Ht 5\' 3"  (1.6 m)   Wt 162 lb 0.6 oz (73.5 kg)   SpO2 98%   BMI 28.70 kg/m   Pleasant  female, alert and oriented x 3, in no cardio-pulmonary distress. Afebrile. HEENT No facial trauma or asymetry. Sinuses non tender.  Extra occullar muscles intact.. External ears normal, . Neck: adequate ROM, no adenopathy,JVD or thyromegaly.No bruits.  Chest: Clear to ascultation bilaterally.No crackles or wheezes. Non tender to palpation  Breast: Noormal mammogram 01/2020. Not examined.  Cardiovascular system; Heart sounds normal,  S1 and  S2 ,no S3.  No murmur, or thrill. Apical beat not displaced Peripheral pulses normal.  Abdomen: Soft, non tender. No guarding, tenderness or rebound.      Musculoskeletal exam: Decreased  ROM of spine, hips , shoulders and knees.  deformity ,swelling and  crepitus noted. No muscle wasting or atrophy.   Neurologic: Cranial nerves 2 to 12 intact. Power, tone ,sensation normal throughout.  disturbance in gait. No tremor.  Skin: multiple lacerations to right middle finger. Sutures intact and wound still oozing  Pigmentation normal throughout  Psych; Normal mood and affect. Judgement and concentration normal   Assessment & Plan:  Annual physical exam Annual exam as documented. Counseling done  re healthy lifestyle involving commitment to 150 minutes exercise per week, heart healthy diet, and attaining healthy weight.The importance of adequate sleep also discussed. Regular seat belt use and home safety, is also discussed. Changes in health habits are decided on by the patient with goals and time  frames  set for achieving them. Immunization and cancer screening needs are specifically addressed at this visit.   Deep laceration of finger Right  middle finger 1 week injury , not approximated and oozing pus, refer Ortho asap, additional days of antibiotic added. Wound cleaned with saline and dry gauze applied

## 2020-06-03 NOTE — Assessment & Plan Note (Addendum)
Right  middle finger 1 week injury , not approximated and oozing pus, refer Ortho asap, additional days of antibiotic added. Wound cleaned with saline and dry gauze applied

## 2020-06-03 NOTE — Assessment & Plan Note (Signed)

## 2020-06-03 NOTE — Patient Instructions (Addendum)
F/u in office with MD mid  January, call if you need me sooner  Flu vaccine today  You are referred to orthopedics re finger laceration and an additional 5 days of antibiotic are prescribed  Nurse pls add lipid and hepatic panel  to recent lab   Sorry about recent losses  Non fasting chem 7 and eGFR and tSH to 7  days before [place] appointment  Careful no more falls, and stay off lawn mower!!  Thanks for choosing [facility], we consider it a privelige to serve you.

## 2020-06-05 LAB — BASIC METABOLIC PANEL WITH GFR
BUN/Creatinine Ratio: 19 (calc) (ref 6–22)
BUN: 18 mg/dL (ref 7–25)
CO2: 32 mmol/L (ref 20–32)
Calcium: 9.3 mg/dL (ref 8.6–10.4)
Chloride: 102 mmol/L (ref 98–110)
Creat: 0.93 mg/dL — ABNORMAL HIGH (ref 0.60–0.88)
GFR, Est African American: 66 mL/min/{1.73_m2} (ref >=60)
GFR, Est Non African American: 57 mL/min/{1.73_m2} — ABNORMAL LOW (ref >=60)
Glucose, Bld: 101 mg/dL (ref 65–139)
Potassium: 3.9 mmol/L (ref 3.5–5.3)
Sodium: 141 mmol/L (ref 135–146)

## 2020-06-05 LAB — CBC
HCT: 37.1 % (ref 35.0–45.0)
Hemoglobin: 11.8 g/dL (ref 11.7–15.5)
MCH: 28.2 pg (ref 27.0–33.0)
MCHC: 31.8 g/dL — ABNORMAL LOW (ref 32.0–36.0)
MCV: 88.5 fL (ref 80.0–100.0)
MPV: 10.6 fL (ref 7.5–12.5)
Platelets: 235 10*3/uL (ref 140–400)
RBC: 4.19 10*6/uL (ref 3.80–5.10)
RDW: 12.8 % (ref 11.0–15.0)
WBC: 4.8 10*3/uL (ref 3.8–10.8)

## 2020-06-05 LAB — TEST AUTHORIZATION

## 2020-06-05 LAB — HEPATIC FUNCTION PANEL
AG Ratio: 1.6 (calc) (ref 1.0–2.5)
ALT: 9 U/L (ref 6–29)
AST: 20 U/L (ref 10–35)
Albumin: 3.9 g/dL (ref 3.6–5.1)
Alkaline phosphatase (APISO): 77 U/L (ref 37–153)
Bilirubin, Direct: 0.1 mg/dL (ref 0.0–0.2)
Globulin: 2.5 g/dL (calc) (ref 1.9–3.7)
Indirect Bilirubin: 0.2 mg/dL (calc) (ref 0.2–1.2)
Total Bilirubin: 0.3 mg/dL (ref 0.2–1.2)
Total Protein: 6.4 g/dL (ref 6.1–8.1)

## 2020-06-05 LAB — LIPID PANEL
Cholesterol: 182 mg/dL (ref <200)
HDL: 42 mg/dL — ABNORMAL LOW (ref >=50)
LDL Cholesterol (Calc): 112 mg/dL (calc) — ABNORMAL HIGH
Non-HDL Cholesterol (Calc): 140 mg/dL (calc) — ABNORMAL HIGH (ref <130)
Total CHOL/HDL Ratio: 4.3 (calc) (ref <5.0)
Triglycerides: 165 mg/dL — ABNORMAL HIGH (ref <150)

## 2020-06-05 LAB — VITAMIN D 25 HYDROXY (VIT D DEFICIENCY, FRACTURES): Vit D, 25-Hydroxy: 33 ng/mL (ref 30–100)

## 2020-06-05 LAB — TSH: TSH: 5.66 mIU/L — ABNORMAL HIGH (ref 0.40–4.50)

## 2020-06-06 ENCOUNTER — Encounter: Payer: Self-pay | Admitting: Family Medicine

## 2020-06-09 DIAGNOSIS — S61219A Laceration without foreign body of unspecified finger without damage to nail, initial encounter: Secondary | ICD-10-CM | POA: Insufficient documentation

## 2020-07-18 ENCOUNTER — Ambulatory Visit: Payer: Medicare HMO | Admitting: Cardiovascular Disease

## 2020-08-12 ENCOUNTER — Other Ambulatory Visit: Payer: Self-pay | Admitting: Family Medicine

## 2020-09-10 DIAGNOSIS — R7989 Other specified abnormal findings of blood chemistry: Secondary | ICD-10-CM | POA: Diagnosis not present

## 2020-09-10 DIAGNOSIS — I10 Essential (primary) hypertension: Secondary | ICD-10-CM | POA: Diagnosis not present

## 2020-09-11 LAB — BMP8+EGFR
BUN/Creatinine Ratio: 11 — ABNORMAL LOW (ref 12–28)
BUN: 12 mg/dL (ref 8–27)
CO2: 27 mmol/L (ref 20–29)
Calcium: 9.5 mg/dL (ref 8.7–10.3)
Chloride: 100 mmol/L (ref 96–106)
Creatinine, Ser: 1.09 mg/dL — ABNORMAL HIGH (ref 0.57–1.00)
GFR calc Af Amer: 54 mL/min/{1.73_m2} — ABNORMAL LOW (ref >59)
GFR calc non Af Amer: 47 mL/min/{1.73_m2} — ABNORMAL LOW (ref >59)
Glucose: 88 mg/dL (ref 65–99)
Potassium: 3.9 mmol/L (ref 3.5–5.2)
Sodium: 140 mmol/L (ref 134–144)

## 2020-09-11 LAB — TSH: TSH: 3.01 u[IU]/mL (ref 0.450–4.500)

## 2020-09-16 ENCOUNTER — Other Ambulatory Visit: Payer: Self-pay

## 2020-09-16 ENCOUNTER — Telehealth (INDEPENDENT_AMBULATORY_CARE_PROVIDER_SITE_OTHER): Payer: Medicare HMO | Admitting: Family Medicine

## 2020-09-16 ENCOUNTER — Encounter: Payer: Self-pay | Admitting: Family Medicine

## 2020-09-16 VITALS — Ht 63.0 in | Wt 152.0 lb

## 2020-09-16 DIAGNOSIS — Z780 Asymptomatic menopausal state: Secondary | ICD-10-CM

## 2020-09-16 DIAGNOSIS — R7303 Prediabetes: Secondary | ICD-10-CM

## 2020-09-16 DIAGNOSIS — E039 Hypothyroidism, unspecified: Secondary | ICD-10-CM

## 2020-09-16 DIAGNOSIS — M159 Polyosteoarthritis, unspecified: Secondary | ICD-10-CM | POA: Diagnosis not present

## 2020-09-16 DIAGNOSIS — E559 Vitamin D deficiency, unspecified: Secondary | ICD-10-CM

## 2020-09-16 DIAGNOSIS — E7800 Pure hypercholesterolemia, unspecified: Secondary | ICD-10-CM

## 2020-09-16 DIAGNOSIS — I10 Essential (primary) hypertension: Secondary | ICD-10-CM

## 2020-09-16 NOTE — Progress Notes (Signed)
Virtual Visit via Telephone Note  I connected with Tyffani Foglesong on 09/16/20 at  2:00 PM EST by telephone and verified that I am speaking with the correct person using two identifiers.  Location: Patient: home Provider: work   I discussed the limitations, risks, security and privacy concerns of performing an evaluation and management service by telephone and the availability of in person appointments. I also discussed with the patient that there may be a patient responsible charge related to this service. The patient expressed understanding and agreed to proceed.   History of Present Illness: F/U chronic problems C/o uncontrolled pain with new management and will f/u with pain clinic about this Denies recent fever or chills. Denies sinus pressure, nasal congestion, ear pain or sore throat. Denies chest congestion, productive cough or wheezing. Denies chest pains, palpitations and leg swelling Denies abdominal pain, nausea, vomiting,diarrhea or constipation.   Denies dysuria, frequency, hesitancy or incontinence.  Denies headaches, seizures Denies depression,uncontrolled  anxiety or insomnia. Denies skin break down or rash.       Observations/Objective: Ht 5\' 3"  (1.6 m)   Wt 152 lb (68.9 kg)   BMI 26.93 kg/m  Good communication with no confusion and intact memory. Alert and oriented x 3 No signs of respiratory distress during speech    Assessment and Plan: Essential hypertension DASH diet and commitment to daily physical activity for a minimum of 30 minutes discussed and encouraged, as a part of hypertension management. The importance of attaining a healthy weight is also discussed.  BP/Weight 09/16/2020 06/03/2020 05/29/2020 02/20/2020 12/24/2019 10/03/2019 4/54/0981  Systolic BP - 191 478 295 621 308 657  Diastolic BP - 70 75 67 78 78 86  Wt. (Lbs) 152 162.04 160 170 159 - 159  BMI 26.93 28.7 29.26 31.09 31.05 - 31.05       Hyperlipidemia Hyperlipidemia:Low fat  diet discussed and encouraged.   Lipid Panel  Lab Results  Component Value Date   CHOL 182 05/29/2020   HDL 42 (L) 05/29/2020   LDLCALC 112 (H) 05/29/2020   TRIG 165 (H) 05/29/2020   CHOLHDL 4.3 05/29/2020     Needs to reduce fat in diet  Hypothyroid Controlled, no change in medication   Osteoarthritis Severe generalized at increased fall risk, home safety reviewed. Pain control to be addressed by pain management     Follow Up Instructions:    I discussed the assessment and treatment plan with the patient. The patient was provided an opportunity to ask questions and all were answered. The patient agreed with the plan and demonstrated an understanding of the instructions.   The patient was advised to call back or seek an in-person evaluation if the symptoms worsen or if the condition fails to improve as anticipated.  I provided 25 minutes of non-face-to-face time during this encounter.   Tula Nakayama, MD

## 2020-09-16 NOTE — Patient Instructions (Addendum)
F/U in office with MD end  May, call if you need me sooner  Thankful you are doing well overall  Please make appointment with Dr Salvadore Dom your pain to have this addressed  Fasting lipid, cmp and EGFr , TSH and vit D and magnesium level  Days before May apointment    You are referred for a bone density test  Be careful not to fall, use your cane all the time and keep house well lit  Thanks for choosing [facility], we consider it a privelige to serve you.

## 2020-09-19 NOTE — Assessment & Plan Note (Signed)
Hyperlipidemia:Low fat diet discussed and encouraged.   Lipid Panel  Lab Results  Component Value Date   CHOL 182 05/29/2020   HDL 42 (L) 05/29/2020   LDLCALC 112 (H) 05/29/2020   TRIG 165 (H) 05/29/2020   CHOLHDL 4.3 05/29/2020     Needs to reduce fat in diet

## 2020-09-19 NOTE — Assessment & Plan Note (Signed)
Controlled, no change in medication  

## 2020-09-19 NOTE — Assessment & Plan Note (Signed)
Severe generalized at increased fall risk, home safety reviewed. Pain control to be addressed by pain management

## 2020-09-19 NOTE — Assessment & Plan Note (Signed)
DASH diet and commitment to daily physical activity for a minimum of 30 minutes discussed and encouraged, as a part of hypertension management. The importance of attaining a healthy weight is also discussed.  BP/Weight 09/16/2020 06/03/2020 05/29/2020 02/20/2020 12/24/2019 10/03/2019 6/62/9476  Systolic BP - 546 503 546 568 127 517  Diastolic BP - 70 75 67 78 78 86  Wt. (Lbs) 152 162.04 160 170 159 - 159  BMI 26.93 28.7 29.26 31.09 31.05 - 31.05

## 2020-10-22 ENCOUNTER — Ambulatory Visit (INDEPENDENT_AMBULATORY_CARE_PROVIDER_SITE_OTHER): Payer: Medicare HMO | Admitting: Cardiovascular Disease

## 2020-10-22 ENCOUNTER — Other Ambulatory Visit: Payer: Self-pay

## 2020-10-22 ENCOUNTER — Encounter: Payer: Self-pay | Admitting: Cardiovascular Disease

## 2020-10-22 VITALS — BP 122/70 | HR 76 | Ht 63.0 in | Wt 148.0 lb

## 2020-10-22 DIAGNOSIS — E7800 Pure hypercholesterolemia, unspecified: Secondary | ICD-10-CM

## 2020-10-22 DIAGNOSIS — I201 Angina pectoris with documented spasm: Secondary | ICD-10-CM | POA: Diagnosis not present

## 2020-10-22 DIAGNOSIS — E663 Overweight: Secondary | ICD-10-CM

## 2020-10-22 DIAGNOSIS — Z91018 Allergy to other foods: Secondary | ICD-10-CM | POA: Diagnosis not present

## 2020-10-22 DIAGNOSIS — I10 Essential (primary) hypertension: Secondary | ICD-10-CM

## 2020-10-22 DIAGNOSIS — I25118 Atherosclerotic heart disease of native coronary artery with other forms of angina pectoris: Secondary | ICD-10-CM | POA: Diagnosis not present

## 2020-10-22 MED ORDER — EZETIMIBE 10 MG PO TABS: 10.0000 mg | ORAL_TABLET | Freq: Every day | ORAL | 3 refills | Status: DC

## 2020-10-22 NOTE — Progress Notes (Signed)
Patient ID: [name], female   DOB: [birth year], 87 y.o.   MRN: [id number]     Cardiology Office Note    Date:  10/22/2020   ID:  [name], DOB 12-19-1933, MRN [id number]  PCP:  Fayrene Helper, MD  Cardiologist:   Sanda Klein, MD   Chief Complaint  Patient presents with  . Follow-up    12 months.    History of Present Illness:  [name] is a 87 y.o. female with intermittent chest discomfort suggestive of angina pectoris, mild scattered CAD by coronary angiography in 2017, possible coronary spasm, hyperlipidemia (on atorvastatin), borderline diabetes mellitus, hypertension, quit smoking 30 years ago.  After a tick bite in March 2021, she developed alpha-gal sensitization and has changed her diet.  She has not had angina pectoris since her last appointment.  The patient specifically denies any chest pain at rest or with exertion, dyspnea at rest or with exertion, orthopnea, paroxysmal nocturnal dyspnea, syncope, palpitations, focal neurological deficits, intermittent claudication, lower extremity edema, unexplained weight gain, cough, hemoptysis or wheezing.   Past Medical History:  Diagnosis Date  . Anemia   . Blood clot associated with vein wall inflammation    hx of in right leg  . CAD (coronary artery disease)   . Carpal tunnel syndrome on left    Left Hand pain with numbness, new onset   . Chronic back pain   . Chronic back pain    buldging disc  . Constipation   . Diarrhea   . Difficult intubation    small airway  . DJD (degenerative joint disease)    Severe  . Gastric ulcer    hx of   . GERD (gastroesophageal reflux disease)    takes Omeprazole daily  . Headache(784.0)    occasionally  . Hyperlipidemia    takes Lipitor nightly  . Hypertension    takes Amlodipine and Metoprolol daily  . Nocturia   . Obesity   . Pneumonia    hx of-in 2011  . Shortness of breath    with exertion  . Sleep apnea    sleep study done 94yrs ago   . Thyroid mass    right    Past Surgical History:  Procedure Laterality Date  . ABDOMINAL HYSTERECTOMY    . APPENDECTOMY  1987  . Arthoscopy left knee  1992  . Arthroscopy left shoulder  1999  . Back  Surgery  lumbar  1989 /1999   x 2  . BACK SURGERY    . CARDIAC CATHETERIZATION  05/2010  . CARDIAC CATHETERIZATION N/A 09/30/2015   Procedure: Left Heart Cath and Coronary Angiography;  Surgeon: Troy Sine, MD;  Location: [facility];  Service: Cardiovascular;  Laterality: N/A;  . Carpal tunnel release right     x 2  . CHOLECYSTECTOMY    . COLONOSCOPY    . ESOPHAGOGASTRODUODENOSCOPY    . LUMBAR FUSION  March 19, 2014  . NM MYOCAR PERF WALL MOTION  11/12/2008   no ischemia  . PARTIAL HYSTERECTOMY  1972  . ROTATOR CUFF REPAIR     right  . SPINE SURGERY    . THYROIDECTOMY  11/17/2011   Procedure: THYROIDECTOMY;  Surgeon: Ascencion Dike, MD;  Location: [facility];  Service: ENT;  Laterality: Right;  WITH FROZEN SECTION  . TONSILLECTOMY      Outpatient Medications Prior to Visit  Medication Sig Dispense Refill  . amLODipine (NORVASC) 10 MG tablet TAKE 1 TABLET(10 MG) BY MOUTH  DAILY 90 tablet 3  . Ascorbic Acid (VITAMIN C) 500 MG tablet Take 500 mg by mouth daily.    Marland Kitchen aspirin EC 81 MG tablet Take 81 mg by mouth daily.    Marland Kitchen atorvastatin (LIPITOR) 40 MG tablet TAKE 1 TABLET(40 MG) BY MOUTH DAILY AT 6 PM (Patient taking differently: Take 40 mg by mouth every evening.) 90 tablet 3  . buprenorphine (BUTRANS) 20 MCG/HR PTWK Place 1 patch onto the skin once a week.    . calcium carbonate (OS-CAL - DOSED IN MG OF ELEMENTAL CALCIUM) 1250 MG tablet Take 1 tablet by mouth daily.    . Cyanocobalamin (B-12) 1000 MCG CAPS Take 1 capsule by mouth daily.    . diclofenac sodium (VOLTAREN) 1 % GEL Apply 2 g topically 2 (two) times daily as needed (for back pain).    Marland Kitchen EPINEPHrine 0.3 mg/0.3 mL IJ SOAJ injection INJECT INTO THE MUSCLE AS NEEDED 2 each 1  . famotidine (PEPCID) 20 MG tablet Take 1  tablet (20 mg total) by mouth 2 (two) times daily. 10 tablet 0  . fluticasone (FLONASE) 50 MCG/ACT nasal spray SHAKE LIQUID AND USE 1 SPRAY IN EACH NOSTRIL DAILY (Patient taking differently: Place 1 spray into both nostrils daily as needed for allergies.) 16 g 3  . hydrochlorothiazide (MICROZIDE) 12.5 MG capsule TAKE 1 CAPSULE(12.5 MG) BY MOUTH DAILY 30 capsule 6  . isosorbide mononitrate (IMDUR) 30 MG 24 hr tablet Take 1 tablet (30 mg total) by mouth daily. 90 tablet 3  . levothyroxine (SYNTHROID) 25 MCG tablet TAKE 1 TABLET BY MOUTH EVERY MORNING 90 tablet 1  . Multiple Vitamin (MULITIVITAMIN WITH MINERALS) TABS Take 1 tablet by mouth daily.    . nitroGLYCERIN (NITROLINGUAL) 0.4 MG/SPRAY spray Place 1 spray under the tongue every 5 (five) minutes x 3 doses as needed for chest pain. 12 g 1  . potassium gluconate 595 (99 K) MG TABS tablet Take 1 tablet (595 mg total) by mouth every evening. 30 tablet 1   No facility-administered medications prior to visit.     Allergies:   Ace inhibitors, Beef-derived products, Bupropion hcl, Codeine, Penicillins, Angiotensin receptor blockers, Bee venom, Lambs quarters, and Pork-derived products   Social History   Socioeconomic History  . Marital status: Widowed    Spouse name: Not on file  . Number of children: 4  . Years of education: Not on file  . Highest education level: Not on file  Occupational History    Employer: RETIRED  Tobacco Use  . Smoking status: Former Smoker    Types: Cigarettes    Quit date: 09/06/1984    Years since quitting: 36.1  . Smokeless tobacco: Never Used  Vaping Use  . Vaping Use: Never used  Substance and Sexual Activity  . Alcohol use: No    Comment: Casual  . Drug use: No  . Sexual activity: Never    Birth control/protection: Surgical  Other Topics Concern  . Not on file  Social History Narrative  . Not on file   Social Determinants of Health   Financial Resource Strain: Low Risk   . Difficulty of Paying  Living Expenses: Not hard at all  Food Insecurity: No Food Insecurity  . Worried About Charity fundraiser in the Last Year: Never true  . Ran Out of Food in the Last Year: Never true  Transportation Needs: No Transportation Needs  . Lack of Transportation (Medical): No  . Lack of Transportation (Non-Medical): No  Physical Activity:  Insufficiently Active  . Days of Exercise per Week: 6 days  . Minutes of Exercise per Session: 10 min  Stress: Not on file  Social Connections: Socially Isolated  . Frequency of Communication with Friends and Family: More than three times a week  . Frequency of Social Gatherings with Friends and Family: Twice a week  . Attends Religious Services: Never  . Active Member of Clubs or Organizations: No  . Attends Archivist Meetings: Never  . Marital Status: Divorced     Family History:  The patient's family history includes Anuerysm in her mother; Stroke in her father.   ROS:   Please see the history of present illness.    ROS All other systems reviewed are negative  PHYSICAL EXAM:   VS:  BP 122/70 (BP Location: Left Arm, Patient Position: Sitting, Cuff Size: Normal)   Pulse 76   Ht 5\' 3"  (1.6 m)   Wt 148 lb (67.1 kg)   BMI 26.22 kg/m     General: Alert, oriented x3, no distress, mild overweight Head: no evidence of trauma, PERRL, EOMI, no exophtalmos or lid lag, no myxedema, no xanthelasma; normal ears, nose and oropharynx Neck: normal jugular venous pulsations and no hepatojugular reflux; brisk carotid pulses without delay and no carotid bruits Chest: clear to auscultation, no signs of consolidation by percussion or palpation, normal fremitus, symmetrical and full respiratory excursions Cardiovascular: normal position and quality of the apical impulse, regular rhythm, normal first and second heart sounds, no murmurs, rubs or gallops Abdomen: no tenderness or distention, no masses by palpation, no abnormal pulsatility or arterial bruits,  normal bowel sounds, no hepatosplenomegaly Extremities: no clubbing, cyanosis or edema; 2+ radial, ulnar and brachial pulses bilaterally; 2+ right femoral, posterior tibial and dorsalis pedis pulses; 2+ left femoral, posterior tibial and dorsalis pedis pulses; no subclavian or femoral bruits Neurological: grossly nonfocal Psych: Normal mood and affect   Wt Readings from Last 3 Encounters:  10/22/20 148 lb (67.1 kg)  09/16/20 152 lb (68.9 kg)  06/03/20 162 lb 0.6 oz (73.5 kg)      Studies/Labs Reviewed:   EKG:  EKG is ordered today.  It shows NSR with a single PAC, otherwise normal  Recent Labs: 05/29/2020: ALT 9; Hemoglobin 11.8; Platelets 235 09/10/2020: BUN 12; Creatinine, Ser 1.09; Potassium 3.9; Sodium 140; TSH 3.010   Lipid Panel    Component Value Date/Time   CHOL 182 05/29/2020 1428   TRIG 165 (H) 05/29/2020 1428   HDL 42 (L) 05/29/2020 1428   CHOLHDL 4.3 05/29/2020 1428   VLDL 34 (H) 05/03/2017 1047   LDLCALC 112 (H) 05/29/2020 1428    ASSESSMENT:    1. Atherosclerosis of native coronary artery of native heart with other form of angina pectoris ([place])   2. Vasospastic angina ([place])   3. Overweight (BMI 25.0-29.9)   4. Essential hypertension   5. Pure hypercholesterolemia   6. Allergy to alpha-gal      PLAN:  In order of problems listed above:  1. Coronary atherosclerosis: There were no severe stenoses at cardiac catheterization.  Add ezetimibe 10 mg once daily, continue atorvastatin.  Target LDL less than 70.  Recheck labs in 3 months. 2. Possible vasospastic angina: Currently well controlled on two vasodilator medications (calcium channel blocker and long-acting nitrate). 3. Overweight: She has done an exceptionally good job with weight loss and is now only mildly overweight. 4. HTN: Very well controlled, may be able to stop hydrochlorothiazide if he loses just a  few more pounds.  No problems with edema.  Use calcium channel blockers preferentially due to the  suspicion for coronary vasospasm 5. HLP: Atorvastatin dose was increased to 40 mg after her last lipid profile. Target LDL under 70.  6. Alpha-gal sensitization: Continues to carefully avoid red meat.  medicines are reviewed at length with the patient today.  Concerns regarding medicines are outlined above.  Medication changes, Labs and Tests ordered today are listed in the Patient Instructions below. Patient Instructions  Medication Instructions:  START Ezetimibe (Zetia) 10 mg once daily  *If you need a refill on your cardiac medications before your next appointment, please call your pharmacy*   Lab Work: Your provider would like for you to return in 3 months to have the following labs drawn: Fasting Lipid. You do not need an appointment for the lab. Once in our office lobby there is a podium where you can sign in and ring the doorbell to alert [place] that you are here. The lab is open from 8:00 am to 4:30 pm; closed for lunch from 12:45pm-1:45pm.  If you have labs (blood work) drawn today and your tests are completely normal, you will receive your results only by: Marland Kitchen MyChart Message (if you have MyChart) OR . A paper copy in the mail If you have any lab test that is abnormal or we need to change your treatment, we will call you to review the results.   Testing/Procedures: None ordered   Follow-Up: At [facility], you and your health needs are our priority.  As part of our continuing mission to provide you with exceptional heart care, we have created designated Provider Care Teams.  These Care Teams include your primary Cardiologist (physician) and Advanced Practice Providers (APPs -  Physician Assistants and Nurse Practitioners) who all work together to provide you with the care you need, when you need it.  We recommend signing up for the patient portal called "MyChart".  Sign up information is provided on this After Visit Summary.  MyChart is used to connect with patients for Virtual  Visits (Telemedicine).  Patients are able to view lab/test results, encounter notes, upcoming appointments, etc.  Non-urgent messages can be sent to your provider as well.   To learn more about what you can do with MyChart, go to [email or web address].    Your next appointment:   12 month(s)  The format for your next appointment:   In Person  Provider:   You may see Sanda Klein, MD or one of the following Advanced Practice Providers on your designated Care Team:    Almyra Deforest, PA-C  Fabian Sharp, [place] or   Roby Lofts, PA-C         Signed, Sanda Klein, MD  10/22/2020 9:59 AM    [facility] [address], [place], [place]  [address] Phone: [phone]; Fax: [phone]

## 2020-10-22 NOTE — Patient Instructions (Signed)
Medication Instructions:  START Ezetimibe (Zetia) 10 mg once daily  *If you need a refill on your cardiac medications before your next appointment, please call your pharmacy*   Lab Work: Your provider would like for you to return in 3 months to have the following labs drawn: Fasting Lipid. You do not need an appointment for the lab. Once in our office lobby there is a podium where you can sign in and ring the doorbell to alert [place] that you are here. The lab is open from 8:00 am to 4:30 pm; closed for lunch from 12:45pm-1:45pm.  If you have labs (blood work) drawn today and your tests are completely normal, you will receive your results only by: Marland Kitchen MyChart Message (if you have MyChart) OR . A paper copy in the mail If you have any lab test that is abnormal or we need to change your treatment, we will call you to review the results.   Testing/Procedures: None ordered   Follow-Up: At [facility], you and your health needs are our priority.  As part of our continuing mission to provide you with exceptional heart care, we have created designated Provider Care Teams.  These Care Teams include your primary Cardiologist (physician) and Advanced Practice Providers (APPs -  Physician Assistants and Nurse Practitioners) who all work together to provide you with the care you need, when you need it.  We recommend signing up for the patient portal called "MyChart".  Sign up information is provided on this After Visit Summary.  MyChart is used to connect with patients for Virtual Visits (Telemedicine).  Patients are able to view lab/test results, encounter notes, upcoming appointments, etc.  Non-urgent messages can be sent to your provider as well.   To learn more about what you can do with MyChart, go to [email or web address].    Your next appointment:   12 month(s)  The format for your next appointment:   In Person  Provider:   You may see Sanda Klein, MD or one of the following Advanced  Practice Providers on your designated Care Team:    Almyra Deforest, PA-C  Fabian Sharp, PA-C or   Roby Lofts, [place]

## 2020-10-23 ENCOUNTER — Ambulatory Visit (INDEPENDENT_AMBULATORY_CARE_PROVIDER_SITE_OTHER): Payer: Medicare HMO | Admitting: Ophthalmology

## 2020-10-23 ENCOUNTER — Other Ambulatory Visit: Payer: Self-pay | Admitting: *Deleted

## 2020-10-23 ENCOUNTER — Encounter (INDEPENDENT_AMBULATORY_CARE_PROVIDER_SITE_OTHER): Payer: Self-pay | Admitting: Ophthalmology

## 2020-10-23 DIAGNOSIS — H33101 Unspecified retinoschisis, right eye: Secondary | ICD-10-CM | POA: Diagnosis not present

## 2020-10-23 DIAGNOSIS — H2512 Age-related nuclear cataract, left eye: Secondary | ICD-10-CM | POA: Diagnosis not present

## 2020-10-23 DIAGNOSIS — H2511 Age-related nuclear cataract, right eye: Secondary | ICD-10-CM

## 2020-10-23 DIAGNOSIS — E7800 Pure hypercholesterolemia, unspecified: Secondary | ICD-10-CM

## 2020-10-23 LAB — LIPID PANEL
Chol/HDL Ratio: 3.5 ratio (ref 0.0–4.4)
Cholesterol, Total: 170 mg/dL (ref 100–199)
HDL: 48 mg/dL (ref >39)
LDL Chol Calc (NIH): 94 mg/dL (ref 0–99)
Triglycerides: 162 mg/dL — ABNORMAL HIGH (ref 0–149)
VLDL Cholesterol Cal: 28 mg/dL (ref 5–40)

## 2020-10-23 NOTE — Assessment & Plan Note (Signed)
OS also progressing refer for evaluation cataract surgery

## 2020-10-23 NOTE — Assessment & Plan Note (Signed)
Progressive nuclear sclerotic changes with darkening centrally accounts for acuity in the right eye.  Will refer for cataract evaluation to [facility] eye care Dr. Herbert Deaner or Dr. Kathlen Mody

## 2020-10-23 NOTE — Progress Notes (Signed)
10/23/2020     CHIEF COMPLAINT Patient presents for Retina Follow Up (6 Month f\u OU. OCT/Pt states vision is the same. Pt states no changes since last visit.)   HISTORY OF PRESENT ILLNESS: [name] is a 87 y.o. 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  Severity is moderate.  Duration of 6 months.  Since onset it is stable.  I, the attending physician,  performed the HPI with the patient and updated documentation appropriately. Additional comments: 6 Month f\u OU. OCT Pt states vision is the same. Pt states no changes since last visit.       Last edited by Tilda Franco on 10/23/2020 10:11 AM. (History)      Referring physician: Fayrene Helper, MD [address], [place],  [place] [address]  HISTORICAL INFORMATION:   Selected notes from the MEDICAL RECORD NUMBER    Lab Results  Component Value Date   HGBA1C 5.4 10/03/2018     CURRENT MEDICATIONS: No current outpatient medications on file. (Ophthalmic Drugs)   No current facility-administered medications for this visit. (Ophthalmic Drugs)   Current Outpatient Medications (Other)  Medication Sig  . amLODipine (NORVASC) 10 MG tablet TAKE 1 TABLET(10 MG) BY MOUTH DAILY  . Ascorbic Acid (VITAMIN C) 500 MG tablet Take 500 mg by mouth daily.  Marland Kitchen aspirin EC 81 MG tablet Take 81 mg by mouth daily.  Marland Kitchen atorvastatin (LIPITOR) 40 MG tablet TAKE 1 TABLET(40 MG) BY MOUTH DAILY AT 6 PM (Patient taking differently: Take 40 mg by mouth every evening.)  . buprenorphine (BUTRANS) 20 MCG/HR PTWK Place 1 patch onto the skin once a week.  . calcium carbonate (OS-CAL - DOSED IN MG OF ELEMENTAL CALCIUM) 1250 MG tablet Take 1 tablet by mouth daily.  . Cyanocobalamin (B-12) 1000 MCG CAPS Take 1 capsule by mouth daily.  . diclofenac sodium (VOLTAREN) 1 % GEL Apply 2 g topically 2 (two) times daily as needed (for back pain).  Marland Kitchen EPINEPHrine 0.3 mg/0.3  mL IJ SOAJ injection INJECT INTO THE MUSCLE AS NEEDED  . ezetimibe (ZETIA) 10 MG tablet Take 1 tablet (10 mg total) by mouth daily.  . famotidine (PEPCID) 20 MG tablet Take 1 tablet (20 mg total) by mouth 2 (two) times daily.  . fluticasone (FLONASE) 50 MCG/ACT nasal spray SHAKE LIQUID AND USE 1 SPRAY IN EACH NOSTRIL DAILY (Patient taking differently: Place 1 spray into both nostrils daily as needed for allergies.)  . hydrochlorothiazide (MICROZIDE) 12.5 MG capsule TAKE 1 CAPSULE(12.5 MG) BY MOUTH DAILY  . isosorbide mononitrate (IMDUR) 30 MG 24 hr tablet Take 1 tablet (30 mg total) by mouth daily.  Marland Kitchen levothyroxine (SYNTHROID) 25 MCG tablet TAKE 1 TABLET BY MOUTH EVERY MORNING  . Multiple Vitamin (MULITIVITAMIN WITH MINERALS) TABS Take 1 tablet by mouth daily.  . nitroGLYCERIN (NITROLINGUAL) 0.4 MG/SPRAY spray Place 1 spray under the tongue every 5 (five) minutes x 3 doses as needed for chest pain.  . potassium gluconate 595 (99 K) MG TABS tablet Take 1 tablet (595 mg total) by mouth every evening.   No current facility-administered medications for this visit. (Other)      REVIEW OF SYSTEMS:    ALLERGIES Allergies  Allergen Reactions  . Ace Inhibitors Anaphylaxis    Was on ventilator for six days due to airway swelling shut while on ACE inhibitors.  . Beef-Derived  Products Shortness Of Breath, Diarrhea and Swelling  . Bupropion Hcl Anaphylaxis  . Codeine Anaphylaxis  . Penicillins Anaphylaxis    Has patient had a PCN reaction causing immediate rash, facial/tongue/throat swelling, SOB or lightheadedness with hypotension: Yes Has patient had a PCN reaction causing severe rash involving mucus membranes or skin necrosis: No Has patient had a PCN reaction that required hospitalization No Has patient had a PCN reaction occurring within the last 10 years: No If all of the above answers are "NO", then may proceed with Cephalosporin use.   . Angiotensin Receptor Blockers Other (See  Comments)    Patient unable to recall what medication this might have been and/or her reaction to it.  . Bee Venom     Facial swelling    . Lambs Quarters     Allergen testing  . Pork-Derived Products     Due to tick bite    PAST MEDICAL HISTORY Past Medical History:  Diagnosis Date  . Anemia   . Blood clot associated with vein wall inflammation    hx of in right leg  . CAD (coronary artery disease)   . Carpal tunnel syndrome on left    Left Hand pain with numbness, new onset   . Chronic back pain   . Chronic back pain    buldging disc  . Constipation   . Diarrhea   . Difficult intubation    small airway  . DJD (degenerative joint disease)    Severe  . Gastric ulcer    hx of   . GERD (gastroesophageal reflux disease)    takes Omeprazole daily  . Headache(784.0)    occasionally  . Hyperlipidemia    takes Lipitor nightly  . Hypertension    takes Amlodipine and Metoprolol daily  . Nocturia   . Obesity   . Pneumonia    hx of-in 2011  . Shortness of breath    with exertion  . Sleep apnea    sleep study done 41yrs ago  . Thyroid mass    right   Past Surgical History:  Procedure Laterality Date  . ABDOMINAL HYSTERECTOMY    . APPENDECTOMY  1987  . Arthoscopy left knee  1992  . Arthroscopy left shoulder  1999  . Back  Surgery  lumbar  1989 /1999   x 2  . BACK SURGERY    . CARDIAC CATHETERIZATION  05/2010  . CARDIAC CATHETERIZATION N/A 09/30/2015   Procedure: Left Heart Cath and Coronary Angiography;  Surgeon: Troy Sine, MD;  Location: [facility];  Service: Cardiovascular;  Laterality: N/A;  . Carpal tunnel release right     x 2  . CHOLECYSTECTOMY    . COLONOSCOPY    . ESOPHAGOGASTRODUODENOSCOPY    . LUMBAR FUSION  March 19, 2014  . NM MYOCAR PERF WALL MOTION  11/12/2008   no ischemia  . PARTIAL HYSTERECTOMY  1972  . ROTATOR CUFF REPAIR     right  . SPINE SURGERY    . THYROIDECTOMY  11/17/2011   Procedure: THYROIDECTOMY;  Surgeon: Ascencion Dike, MD;   Location: [facility];  Service: ENT;  Laterality: Right;  WITH FROZEN SECTION  . TONSILLECTOMY      FAMILY HISTORY Family History  Problem Relation Age of Onset  . Anuerysm Mother        died at 9  . Stroke Father        25 at death   . Anesthesia problems Neg Hx   .  Hypotension Neg Hx   . Malignant hyperthermia Neg Hx   . Pseudochol deficiency Neg Hx   . Allergic rhinitis Neg Hx   . Angioedema Neg Hx   . Asthma Neg Hx   . Atopy Neg Hx   . Eczema Neg Hx   . Immunodeficiency Neg Hx   . Urticaria Neg Hx     SOCIAL HISTORY Social History   Tobacco Use  . Smoking status: Former Smoker    Types: Cigarettes    Quit date: 09/06/1984    Years since quitting: 36.1  . Smokeless tobacco: Never Used  Vaping Use  . Vaping Use: Never used  Substance Use Topics  . Alcohol use: No    Comment: Casual  . Drug use: No         OPHTHALMIC EXAM:  Base Eye Exam    Visual Acuity (Snellen - Linear)      Right Left   Dist cc 20/40 -1 20/40 -1   Dist ph cc NI NI   Correction: Glasses       Tonometry (Tonopen, 10:16 AM)      Right Left   Pressure 13 12       Pupils      Pupils Dark Light Shape React APD   Right PERRL 3 3 Round Minimal None   Left PERRL 3 3 Round Minimal None       Visual Fields (Counting fingers)      Left Right    Full Full       Neuro/Psych    Oriented x3: Yes   Mood/Affect: Normal       Dilation    Both eyes: 1.0% Mydriacyl, 2.5% Phenylephrine @ 10:16 AM        Slit Lamp and Fundus Exam    External Exam      Right Left   External Normal Normal       Slit Lamp Exam      Right Left   Lids/Lashes Normal Normal   Conjunctiva/Sclera White and quiet White and quiet   Cornea Clear Clear   Anterior Chamber Deep and quiet Deep and quiet   Iris Round and reactive Round and reactive   Lens 3+ Nuclear sclerosis, 2+ Cortical cataract 3+ Nuclear sclerosis, 2+ Cortical cataract   Anterior Vitreous Normal Normal       Fundus Exam      Right Left    Posterior Vitreous Posterior vitreous detachment Posterior vitreous detachment   Disc Normal Normal   C/D Ratio 0.4 0.4   Macula Pseudocystoid change, no macular hole Normal   Vessels Normal Normal   Periphery Normal Normal          IMAGING AND PROCEDURES  Imaging and Procedures for 10/23/20  OCT, Retina - OU - Both Eyes       Right Eye Quality was good. Scan locations included subfoveal. Central Foveal Thickness: 357. Findings include abnormal foveal contour.   Left Eye Quality was good. Scan locations included subfoveal. Central Foveal Thickness: 278. Progression has been stable. Findings include normal observations.   Notes Foveal macular schisis OD, unchanged over time.                  ASSESSMENT/PLAN:  Retinoschisis, right Macular retinal schisis right eye by OCT evaluation with no impact on acuity and stable since first evaluation and clinical OCT 04-24-2019 will continue to observe  Age-related nuclear cataract of right eye Progressive nuclear sclerotic changes with darkening centrally accounts for  acuity in the right eye.  Will refer for cataract evaluation to [facility] eye care Dr. Herbert Deaner or Dr. Kathlen Mody  Age-related nuclear cataract of left eye OS also progressing refer for evaluation cataract surgery      ICD-10-CM   1. Retinoschisis, right  H33.101 OCT, Retina - OU - Both Eyes  2. Age-related nuclear cataract of right eye  H25.11   3. Age-related nuclear cataract of left eye  H25.12     1.  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  Observation alone is warranted.  She will need cataract extraction intraocular lens placement and this poses no risk to the visual acuity outcome  2.  Refer for cataract evaluation OU  3.  Ophthalmic Meds Ordered this visit:  No orders of the defined types were placed in this encounter.      Return in about 6 months (around 04/22/2021), or  ,, Refer now to cataract evaluation, [facility], for DILATE OU, OCT.  There are no Patient Instructions on file for this visit.   Explained the diagnoses, plan, and follow up with the patient and they expressed understanding.  Patient expressed understanding of the importance of proper follow up care.   Clent Demark Desirey Keahey M.D. Diseases & Surgery of the Retina and Vitreous [facility] 10/23/20     Abbreviations: M myopia (nearsighted); A astigmatism; H hyperopia (farsighted); P presbyopia; Mrx spectacle prescription;  CTL contact lenses; OD right eye; OS left eye; OU both eyes  XT exotropia; ET esotropia; PEK punctate epithelial keratitis; PEE punctate epithelial erosions; DES dry eye syndrome; MGD meibomian gland dysfunction; ATs artificial tears; PFAT's preservative free artificial tears; [place] nuclear sclerotic cataract; PSC posterior subcapsular cataract; ERM epi-retinal membrane; PVD posterior vitreous detachment; RD retinal detachment; DM diabetes mellitus; DR diabetic retinopathy; NPDR non-proliferative diabetic retinopathy; PDR proliferative diabetic retinopathy; CSME clinically significant macular edema; DME diabetic macular edema; dbh dot blot hemorrhages; CWS cotton wool spot; POAG primary open angle glaucoma; C/D cup-to-disc ratio; HVF humphrey visual field; GVF goldmann visual field; OCT optical coherence tomography; IOP intraocular pressure; BRVO Branch retinal vein occlusion; CRVO central retinal vein occlusion; CRAO central retinal artery occlusion; BRAO branch retinal artery occlusion; RT retinal tear; SB scleral buckle; PPV pars plana vitrectomy; VH Vitreous hemorrhage; PRP panretinal laser photocoagulation; IVK intravitreal kenalog; VMT vitreomacular traction; MH Macular hole;  NVD neovascularization of the disc; NVE neovascularization elsewhere; AREDS age related eye disease study; ARMD age related macular degeneration; POAG primary open angle glaucoma; EBMD  epithelial/anterior basement membrane dystrophy; ACIOL anterior chamber intraocular lens; IOL intraocular lens; PCIOL posterior chamber intraocular lens; Phaco/IOL phacoemulsification with intraocular lens placement; [place] photorefractive keratectomy; LASIK laser assisted in situ keratomileusis; HTN hypertension; DM diabetes mellitus; COPD chronic obstructive pulmonary disease

## 2020-10-23 NOTE — Assessment & Plan Note (Signed)
Macular retinal schisis right eye by OCT evaluation with no impact on acuity and stable since first evaluation and clinical OCT 04-24-2019 will continue to observe

## 2020-11-10 ENCOUNTER — Other Ambulatory Visit: Payer: Self-pay | Admitting: Family Medicine

## 2020-11-10 ENCOUNTER — Telehealth: Payer: Self-pay

## 2020-11-10 NOTE — Telephone Encounter (Signed)
fmla forms received  Copied Noted Sleeved

## 2020-11-20 ENCOUNTER — Ambulatory Visit (HOSPITAL_COMMUNITY)
Admission: RE | Admit: 2020-11-20 | Discharge: 2020-11-20 | Disposition: A | Discharge status: Home / Self Care | Payer: Medicare HMO | Source: Ambulatory Visit | Attending: Family Medicine | Admitting: Family Medicine

## 2020-11-20 ENCOUNTER — Other Ambulatory Visit: Payer: Self-pay

## 2020-11-20 DIAGNOSIS — M8589 Other specified disorders of bone density and structure, multiple sites: Secondary | ICD-10-CM | POA: Diagnosis not present

## 2020-11-20 DIAGNOSIS — Z780 Asymptomatic menopausal state: Secondary | ICD-10-CM | POA: Diagnosis not present

## 2020-11-25 DIAGNOSIS — Z79899 Other long term (current) drug therapy: Secondary | ICD-10-CM | POA: Diagnosis not present

## 2020-11-25 DIAGNOSIS — G894 Chronic pain syndrome: Secondary | ICD-10-CM | POA: Diagnosis not present

## 2020-11-25 DIAGNOSIS — M1710 Unilateral primary osteoarthritis, unspecified knee: Secondary | ICD-10-CM | POA: Diagnosis not present

## 2020-11-25 DIAGNOSIS — M47816 Spondylosis without myelopathy or radiculopathy, lumbar region: Secondary | ICD-10-CM | POA: Diagnosis not present

## 2020-11-25 DIAGNOSIS — M189 Osteoarthritis of first carpometacarpal joint, unspecified: Secondary | ICD-10-CM | POA: Diagnosis not present

## 2020-11-25 DIAGNOSIS — Z79891 Long term (current) use of opiate analgesic: Secondary | ICD-10-CM | POA: Diagnosis not present

## 2020-11-27 DIAGNOSIS — Z8489 Family history of other specified conditions: Secondary | ICD-10-CM

## 2020-11-28 NOTE — Telephone Encounter (Signed)
Completed  Pt notified

## 2020-12-02 NOTE — Telephone Encounter (Signed)
Pt picked up and paid for forms.

## 2020-12-22 ENCOUNTER — Other Ambulatory Visit (HOSPITAL_COMMUNITY): Payer: Self-pay | Admitting: Family Medicine

## 2020-12-22 DIAGNOSIS — Z1231 Encounter for screening mammogram for malignant neoplasm of breast: Secondary | ICD-10-CM

## 2020-12-25 DIAGNOSIS — M1710 Unilateral primary osteoarthritis, unspecified knee: Secondary | ICD-10-CM | POA: Diagnosis not present

## 2020-12-25 DIAGNOSIS — M189 Osteoarthritis of first carpometacarpal joint, unspecified: Secondary | ICD-10-CM | POA: Diagnosis not present

## 2020-12-25 DIAGNOSIS — M47816 Spondylosis without myelopathy or radiculopathy, lumbar region: Secondary | ICD-10-CM | POA: Diagnosis not present

## 2020-12-25 DIAGNOSIS — G894 Chronic pain syndrome: Secondary | ICD-10-CM | POA: Diagnosis not present

## 2020-12-29 NOTE — Progress Notes (Signed)
Subjective:   [name] is a 87 y.o. female who presents for Medicare Annual (Subsequent) preventive examination.  I connected with [name]  today by telephone and verified that I am speaking with the correct person using two identifiers. Location patient: home Location provider: work Persons participating in the virtual visit: patient, provider.   I discussed the limitations, risks, security and privacy concerns of performing an evaluation and management service by telephone and the availability of in person appointments. I also discussed with the patient that there may be a patient responsible charge related to this service. The patient expressed understanding and verbally consented to this telephonic visit.    Interactive audio and video telecommunications were attempted between this provider and patient, however failed, due to patient having technical difficulties OR patient did not have access to video capability.  We continued and completed visit with audio only.      Review of Systems    N/A  Cardiac Risk Factors include: advanced age (>54men, >81 women);dyslipidemia;hypertension     Objective:    Today's Vitals   12/31/20 1101  PainSc: 7    There is no height or weight on file to calculate BMI.  Advanced Directives 12/31/2020 05/29/2020 02/02/2019 02/02/2019 01/17/2019 12/22/2018 12/22/2018  Does Patient Have a Medical Advance Directive? Yes No No No No Yes Yes  Type of Paramedic of [place];Living will - - - - - -  Does patient want to make changes to medical advance directive? No - Patient declined - - - - - No - Patient declined  Copy of [place] in Chart? No - copy requested - - - - - -  Would patient like information on creating a medical advance directive? - - No - Patient declined No - Patient declined - - -  Pre-existing out of facility DNR order (yellow form or pink MOST form) - - - - - - -    Current  Medications (verified) Outpatient Encounter Medications as of 12/31/2020  Medication Sig  . amLODipine (NORVASC) 10 MG tablet TAKE 1 TABLET(10 MG) BY MOUTH DAILY  . Ascorbic Acid (VITAMIN C) 500 MG tablet Take 500 mg by mouth daily.  Marland Kitchen aspirin EC 81 MG tablet Take 81 mg by mouth daily.  Marland Kitchen atorvastatin (LIPITOR) 40 MG tablet TAKE 1 TABLET(40 MG) BY MOUTH DAILY AT 6 PM (Patient taking differently: Take 40 mg by mouth every evening.)  . calcium carbonate (OS-CAL - DOSED IN MG OF ELEMENTAL CALCIUM) 1250 MG tablet Take 1 tablet by mouth daily.  . Cyanocobalamin (B-12) 1000 MCG CAPS Take 1 capsule by mouth daily.  . diclofenac sodium (VOLTAREN) 1 % GEL Apply 2 g topically 2 (two) times daily as needed (for back pain).  Marland Kitchen ezetimibe (ZETIA) 10 MG tablet Take 1 tablet (10 mg total) by mouth daily.  . famotidine (PEPCID) 20 MG tablet Take 1 tablet (20 mg total) by mouth 2 (two) times daily.  . hydrochlorothiazide (MICROZIDE) 12.5 MG capsule TAKE 1 CAPSULE(12.5 MG) BY MOUTH DAILY  . isosorbide mononitrate (IMDUR) 30 MG 24 hr tablet Take 1 tablet (30 mg total) by mouth daily.  Marland Kitchen levothyroxine (SYNTHROID) 25 MCG tablet TAKE 1 TABLET BY MOUTH EVERY MORNING  . Multiple Vitamin (MULITIVITAMIN WITH MINERALS) TABS Take 1 tablet by mouth daily.  . potassium gluconate 595 (99 K) MG TABS tablet Take 1 tablet (595 mg total) by mouth every evening.  . buprenorphine (BUTRANS) 20 MCG/HR PTWK Place 1 patch  onto the skin once a week. (Patient not taking: Reported on 12/31/2020)  . EPINEPHrine 0.3 mg/0.3 mL IJ SOAJ injection INJECT INTO THE MUSCLE AS NEEDED (Patient not taking: Reported on 12/31/2020)  . fluticasone (FLONASE) 50 MCG/ACT nasal spray SHAKE LIQUID AND USE 1 SPRAY IN EACH NOSTRIL DAILY (Patient not taking: Reported on 12/31/2020)  . nitroGLYCERIN (NITROLINGUAL) 0.4 MG/SPRAY spray Place 1 spray under the tongue every 5 (five) minutes x 3 doses as needed for chest pain. (Patient not taking: Reported on 12/31/2020)    No facility-administered encounter medications on file as of 12/31/2020.    Allergies (verified) Ace inhibitors, Beef-derived products, Bupropion hcl, Codeine, Penicillins, Angiotensin receptor blockers, Bee venom, Lambs quarters, and Pork-derived products   History: Past Medical History:  Diagnosis Date  . Anemia   . Blood clot associated with vein wall inflammation    hx of in right leg  . CAD (coronary artery disease)   . Carpal tunnel syndrome on left    Left Hand pain with numbness, new onset   . Chronic back pain   . Chronic back pain    buldging disc  . Constipation   . Diarrhea   . Difficult intubation    small airway  . DJD (degenerative joint disease)    Severe  . Gastric ulcer    hx of   . GERD (gastroesophageal reflux disease)    takes Omeprazole daily  . Headache(784.0)    occasionally  . Hyperlipidemia    takes Lipitor nightly  . Hypertension    takes Amlodipine and Metoprolol daily  . Nocturia   . Obesity   . Pneumonia    hx of-in 2011  . Shortness of breath    with exertion  . Sleep apnea    sleep study done 17yrs ago  . Thyroid mass    right   Past Surgical History:  Procedure Laterality Date  . ABDOMINAL HYSTERECTOMY    . APPENDECTOMY  1987  . Arthoscopy left knee  1992  . Arthroscopy left shoulder  1999  . Back  Surgery  lumbar  1989 /1999   x 2  . BACK SURGERY    . CARDIAC CATHETERIZATION  05/2010  . CARDIAC CATHETERIZATION N/A 09/30/2015   Procedure: Left Heart Cath and Coronary Angiography;  Surgeon: Troy Sine, MD;  Location: [facility];  Service: Cardiovascular;  Laterality: N/A;  . Carpal tunnel release right     x 2  . CHOLECYSTECTOMY    . COLONOSCOPY    . ESOPHAGOGASTRODUODENOSCOPY    . LUMBAR FUSION  March 19, 2014  . NM MYOCAR PERF WALL MOTION  11/12/2008   no ischemia  . PARTIAL HYSTERECTOMY  1972  . ROTATOR CUFF REPAIR     right  . SPINE SURGERY    . THYROIDECTOMY  11/17/2011   Procedure: THYROIDECTOMY;   Surgeon: Ascencion Dike, MD;  Location: [facility];  Service: ENT;  Laterality: Right;  WITH FROZEN SECTION  . TONSILLECTOMY     Family History  Problem Relation Age of Onset  . Anuerysm Mother        died at 48  . Stroke Father        100 at death   . Anesthesia problems Neg Hx   . Hypotension Neg Hx   . Malignant hyperthermia Neg Hx   . Pseudochol deficiency Neg Hx   . Allergic rhinitis Neg Hx   . Angioedema Neg Hx   . Asthma Neg Hx   .  Atopy Neg Hx   . Eczema Neg Hx   . Immunodeficiency Neg Hx   . Urticaria Neg Hx    Social History   Socioeconomic History  . Marital status: Widowed    Spouse name: Not on file  . Number of children: 4  . Years of education: Not on file  . Highest education level: Not on file  Occupational History    Employer: RETIRED  Tobacco Use  . Smoking status: Former Smoker    Types: Cigarettes    Quit date: 09/06/1984    Years since quitting: 36.3  . Smokeless tobacco: Never Used  Vaping Use  . Vaping Use: Never used  Substance and Sexual Activity  . Alcohol use: No    Comment: Casual  . Drug use: No  . Sexual activity: Never    Birth control/protection: Surgical  Other Topics Concern  . Not on file  Social History Narrative  . Not on file   Social Determinants of Health   Financial Resource Strain: Low Risk   . Difficulty of Paying Living Expenses: Not hard at all  Food Insecurity: No Food Insecurity  . Worried About Charity fundraiser in the Last Year: Never true  . Ran Out of Food in the Last Year: Never true  Transportation Needs: No Transportation Needs  . Lack of Transportation (Medical): No  . Lack of Transportation (Non-Medical): No  Physical Activity: Insufficiently Active  . Days of Exercise per Week: 6 days  . Minutes of Exercise per Session: 20 min  Stress: No Stress Concern Present  . Feeling of Stress : Not at all  Social Connections: Moderately Isolated  . Frequency of Communication with Friends and Family: More than three  times a week  . Frequency of Social Gatherings with Friends and Family: Never  . Attends Religious Services: More than 4 times per year  . Active Member of Clubs or Organizations: No  . Attends Archivist Meetings: Never  . Marital Status: Widowed    Tobacco Counseling Counseling given: Not Answered   Clinical Intake:  Pre-visit preparation completed: Yes  Pain : 0-10 Pain Score: 7  Pain Type: Chronic pain Pain Location: Back Pain Orientation: Upper,Mid,Lower Pain Descriptors / Indicators: Aching,Stabbing Pain Onset: More than a month ago Pain Frequency: Constant Pain Relieving Factors: Back brace, Pain medication  Pain Relieving Factors: Back brace, Pain medication  Nutritional Risks: Nausea/ vomitting/ diarrhea Diabetes: No  How often do you need to have someone help you when you read instructions, pamphlets, or other written materials from your doctor or pharmacy?: 1 - Never  Diabetic? No  Interpreter Needed?: No  Information entered by :: [place] of Daily Living In your present state of health, do you have any difficulty performing the following activities: 12/31/2020  Hearing? N  Vision? N  Difficulty concentrating or making decisions? N  Walking or climbing stairs? Y  Comment gets fatigued easily  Dressing or bathing? N  Doing errands, shopping? N  Preparing Food and eating ? N  Using the Toilet? N  In the past six months, have you accidently leaked urine? N  Do you have problems with loss of bowel control? N  Managing your Medications? N  Managing your Finances? N  Housekeeping or managing your Housekeeping? N  Some recent data might be hidden    Patient Care Team: Fayrene Helper, MD as PCP - General Croitoru, Dani Gobble, MD as PCP - Cardiology (Cardiology) Leta Baptist, MD as  Attending Physician (Otolaryngology) Daneil Dolin, MD as Attending Physician (Gastroenterology) Latanya Maudlin, MD as Consulting Physician  (Orthopedic Surgery) Croitoru, Dani Gobble, MD as Consulting Physician (Cardiology)  Indicate any recent Medical Services you may have received from other than [facility] providers in the past year (date may be approximate).     Assessment:   This is a routine wellness examination for Maliya.  Hearing/Vision screen  Hearing Screening   125Hz  250Hz  500Hz  1000Hz  2000Hz  3000Hz  4000Hz  6000Hz  8000Hz   Right ear:           Left ear:           Vision Screening Comments: Patient states gets eye exam once per year. Has cataracts forming to both eyes. Currently wears glasses   Dietary issues and exercise activities discussed: Current Exercise Habits: Home exercise routine, Type of exercise: walking, Time (Minutes): 15, Frequency (Times/Week): 6, Weekly Exercise (Minutes/Week): 90, Intensity: Mild, Exercise limited by: orthopedic condition(s)  Goals    . DIET - INCREASE WATER INTAKE     Drinks several bottles of water everyday    . Exercise 3x per week (30 min per time)    . LIFESTYLE - DECREASE FALLS RISK    . Prevent falls     Uses cane when ambulating since she is alone a lot of the time at home       Depression Screen PHQ 2/9 Scores 12/31/2020 06/03/2020 02/20/2020 05/21/2019 05/21/2019 12/22/2018 10/02/2018  PHQ - 2 Score 0 1 0 2 2 1  0  PHQ- 9 Score - - - 5 4 - -    Fall Risk Fall Risk  12/31/2020 06/03/2020 02/20/2020 12/24/2019 09/26/2019  Falls in the past year? 0 1 1 0 0  Number falls in past yr: 0 1 1 0 0  Injury with Fall? 0 0 1 0 0  Risk for fall due to : Orthopedic patient - - - -  Follow up Falls evaluation completed;Falls prevention discussed - - - -    FALL RISK PREVENTION PERTAINING TO THE HOME:  Any stairs in or around the home? Yes  If so, are there any without handrails? No  Home free of loose throw rugs in walkways, pet beds, electrical cords, etc? Yes  Adequate lighting in your home to reduce risk of falls? Yes   ASSISTIVE DEVICES UTILIZED TO PREVENT FALLS:  Life alert? No   Use of a cane, walker or w/c? Yes  Grab bars in the bathroom? Yes  Shower chair or bench in shower? Yes  Elevated toilet seat or a handicapped toilet? Yes   Cognitive Function: Normal cognitive status assessed by direct observation by this Nurse Health Advisor. No abnormalities found.   MMSE - Mini Mental State Exam 09/26/2017 01/27/2017  Orientation to time 5 5  Orientation to Place 5 5  Registration 3 3  Attention/ Calculation 5 5  Recall 0 3  Language- name 2 objects 2 -  Language- repeat 1 -  Language- follow 3 step command 3 -  Language- read & follow direction 1 -  Write a sentence 0 -  Copy design 1 -  Total score 26 -     6CIT Screen 12/24/2019 12/22/2018 12/22/2018 12/19/2017  What Year? 0 points 0 points - 0 points  What month? 0 points 0 points - 0 points  What time? 0 points 0 points 0 points 3 points  Count back from 20 0 points 0 points - 0 points  Months in reverse 0 points 0 points -  0 points  Repeat phrase 4 points 0 points - 8 points  Total Score 4 0 - 11    Immunizations Immunization History  Administered Date(s) Administered  . Fluad Quad(high Dose 65+) 05/28/2019, 06/03/2020  . H1N1 10/01/2008  . Influenza Split 05/18/2012  . Influenza Whole 05/26/2007, 06/12/2009, 05/26/2011  . Influenza,inj,Quad PF,6+ Mos 06/22/2013, 06/25/2014, 08/06/2015, 09/28/2016, 04/27/2017, 05/02/2018  . PFIZER(Purple Top)SARS-COV-2 Vaccination 10/15/2019, 11/09/2019, 08/05/2020  . Pneumococcal Conjugate-13 03/17/2015  . Pneumococcal Polysaccharide-23 02/11/2004  . Td 02/11/2004  . Tdap 07/14/2013, 05/29/2020  . Zoster 12/26/2006    TDAP status: Up to date  Flu Vaccine status: Up to date  Pneumococcal vaccine status: Up to date  Covid-19 vaccine status: Completed vaccines  Qualifies for Shingles Vaccine? Yes   Zostavax completed Yes   Shingrix Completed?: No.    Education has been provided regarding the importance of this vaccine. Patient has been advised to call  insurance company to determine out of pocket expense if they have not yet received this vaccine. Advised may also receive vaccine at local pharmacy or Health Dept. Verbalized acceptance and understanding.  Screening Tests Health Maintenance  Topic Date Due  . INFLUENZA VACCINE  04/06/2021  . TETANUS/TDAP  05/29/2030  . DEXA SCAN  Completed  . COVID-19 Vaccine  Completed  . PNA vac Low Risk Adult  Completed  . HPV VACCINES  Aged Out    Health Maintenance  There are no preventive care reminders to display for this patient.  Colorectal cancer screening: No longer required.   Mammogram status: No longer required due to age.  Bone Density status: Completed 11/20/2020. Results reflect: Bone density results: OSTEOPENIA. Repeat every 5 years.  Lung Cancer Screening: (Low Dose CT Chest recommended if Age 11-80 years, 30 pack-year currently smoking OR have quit w/in 15years.) does not qualify.   Lung Cancer Screening Referral: N/A   Additional Screening:  Hepatitis C Screening: does not qualify;   Vision Screening: Recommended annual ophthalmology exams for early detection of glaucoma and other disorders of the eye. Is the patient up to date with their annual eye exam?  Yes  Who is the provider or what is the name of the office in which the patient attends annual eye exams? Eye doctor in [place]  If pt is not established with a provider, would they like to be referred to a provider to establish care? No .   Dental Screening: Recommended annual dental exams for proper oral hygiene  Community Resource Referral / Chronic Care Management: CRR required this visit?  No   CCM required this visit?  No      Plan:     I have personally reviewed and noted the following in the patient's chart:   . Medical and social history . Use of alcohol, tobacco or illicit drugs  . Current medications and supplements . Functional ability and status . Nutritional status . Physical  activity . Advanced directives . List of other physicians . Hospitalizations, surgeries, and ER visits in previous 12 months . Vitals . Screenings to include cognitive, depression, and falls . Referrals and appointments  In addition, I have reviewed and discussed with patient certain preventive protocols, quality metrics, and best practice recommendations. A written personalized care plan for preventive services as well as general preventive health recommendations were provided to patient.     Ofilia Neas, LPN   [id number]   Nurse Notes: None

## 2020-12-31 ENCOUNTER — Other Ambulatory Visit: Payer: Self-pay

## 2020-12-31 ENCOUNTER — Ambulatory Visit (INDEPENDENT_AMBULATORY_CARE_PROVIDER_SITE_OTHER): Payer: Medicare HMO

## 2020-12-31 DIAGNOSIS — Z0000 Encounter for general adult medical examination without abnormal findings: Secondary | ICD-10-CM | POA: Diagnosis not present

## 2020-12-31 NOTE — Patient Instructions (Signed)
[name] , Thank you for taking time to come for your Medicare Wellness Visit. I appreciate your ongoing commitment to your health goals. Please review the following plan we discussed and let me know if I can assist you in the future.   Screening recommendations/referrals: Colonoscopy: No longer required  Mammogram: Currently scheduled, please keep appointment scheduled for 01/26/2021  Bone Density: No longer required  Recommended yearly ophthalmology/optometry visit for glaucoma screening and checkup Recommended yearly dental visit for hygiene and checkup  Vaccinations: Influenza vaccine: Up to date, next due fall 2022  Pneumococcal vaccine: Completed series  Tdap vaccine: Up to date, next due 05/29/2030 Shingles vaccine: Currently due for Shingrix, If you would like to receive we recommend that you do so at your local pharmacy as it is less expensive .    Advanced directives: Please bring copies of your advanced medical directives into our office so that we may scan them into your chart.   Conditions/risks identified: None   Next appointment: 01/06/2022 @ 11:00 am with Nurse Health Advisor via telephone    Preventive Care 65 Years and Older, Female Preventive care refers to lifestyle choices and visits with your health care provider that can promote health and wellness. What does preventive care include?  A yearly physical exam. This is also called an annual well check.  Dental exams once or twice a year.  Routine eye exams. Ask your health care provider how often you should have your eyes checked.  Personal lifestyle choices, including:  Daily care of your teeth and gums.  Regular physical activity.  Eating a healthy diet.  Avoiding tobacco and drug use.  Limiting alcohol use.  Practicing safe sex.  Taking low-dose aspirin every day.  Taking vitamin and mineral supplements as recommended by your health care provider. What happens during an annual well  check? The services and screenings done by your health care provider during your annual well check will depend on your age, overall health, lifestyle risk factors, and family history of disease. Counseling  Your health care provider may ask you questions about your:  Alcohol use.  Tobacco use.  Drug use.  Emotional well-being.  Home and relationship well-being.  Sexual activity.  Eating habits.  History of falls.  Memory and ability to understand (cognition).  Work and work Statistician.  Reproductive health. Screening  You may have the following tests or measurements:  Height, weight, and BMI.  Blood pressure.  Lipid and cholesterol levels. These may be checked every 5 years, or more frequently if you are over 79 years old.  Skin check.  Lung cancer screening. You may have this screening every year starting at age 37 if you have a 30-pack-year history of smoking and currently smoke or have quit within the past 15 years.  Fecal occult blood test (FOBT) of the stool. You may have this test every year starting at age 37.  Flexible sigmoidoscopy or colonoscopy. You may have a sigmoidoscopy every 5 years or a colonoscopy every 10 years starting at age 22.  Hepatitis C blood test.  Hepatitis B blood test.  Sexually transmitted disease (STD) testing.  Diabetes screening. This is done by checking your blood sugar (glucose) after you have not eaten for a while (fasting). You may have this done every 1-3 years.  Bone density scan. This is done to screen for osteoporosis. You may have this done starting at age 85.  Mammogram. This may be done every 1-2 years. Talk to your health care  provider about how often you should have regular mammograms. Talk with your health care provider about your test results, treatment options, and if necessary, the need for more tests. Vaccines  Your health care provider may recommend certain vaccines, such as:  Influenza vaccine. This is  recommended every year.  Tetanus, diphtheria, and acellular pertussis (Tdap, Td) vaccine. You may need a Td booster every 10 years.  Zoster vaccine. You may need this after age 49.  Pneumococcal 13-valent conjugate (PCV13) vaccine. One dose is recommended after age 42.  Pneumococcal polysaccharide (PPSV23) vaccine. One dose is recommended after age 76. Talk to your health care provider about which screenings and vaccines you need and how often you need them. This information is not intended to replace advice given to you by your health care provider. Make sure you discuss any questions you have with your health care provider. Document Released: 09/19/2015 Document Revised: 05/12/2016 Document Reviewed: 06/24/2015 Elsevier Interactive Patient Education  2017 [place] Prevention in the Home Falls can cause injuries. They can happen to people of all ages. There are many things you can do to make your home safe and to help prevent falls. What can I do on the outside of my home?  Regularly fix the edges of walkways and driveways and fix any cracks.  Remove anything that might make you trip as you walk through a door, such as a raised step or threshold.  Trim any bushes or trees on the path to your home.  Use bright outdoor lighting.  Clear any walking paths of anything that might make someone trip, such as rocks or tools.  Regularly check to see if handrails are loose or broken. Make sure that both sides of any steps have handrails.  Any raised decks and porches should have guardrails on the edges.  Have any leaves, snow, or ice cleared regularly.  Use sand or salt on walking paths during winter.  Clean up any spills in your garage right away. This includes oil or grease spills. What can I do in the bathroom?  Use night lights.  Install grab bars by the toilet and in the tub and shower. Do not use towel bars as grab bars.  Use non-skid mats or decals in the tub or  shower.  If you need to sit down in the shower, use a plastic, non-slip stool.  Keep the floor dry. Clean up any water that spills on the floor as soon as it happens.  Remove soap buildup in the tub or shower regularly.  Attach bath mats securely with double-sided non-slip rug tape.  Do not have throw rugs and other things on the floor that can make you trip. What can I do in the bedroom?  Use night lights.  Make sure that you have a light by your bed that is easy to reach.  Do not use any sheets or blankets that are too big for your bed. They should not hang down onto the floor.  Have a firm chair that has side arms. You can use this for support while you get dressed.  Do not have throw rugs and other things on the floor that can make you trip. What can I do in the kitchen?  Clean up any spills right away.  Avoid walking on wet floors.  Keep items that you use a lot in easy-to-reach places.  If you need to reach something above you, use a strong step stool that has a grab bar.  Keep electrical cords out of the way.  Do not use floor polish or wax that makes floors slippery. If you must use wax, use non-skid floor wax.  Do not have throw rugs and other things on the floor that can make you trip. What can I do with my stairs?  Do not leave any items on the stairs.  Make sure that there are handrails on both sides of the stairs and use them. Fix handrails that are broken or loose. Make sure that handrails are as long as the stairways.  Check any carpeting to make sure that it is firmly attached to the stairs. Fix any carpet that is loose or worn.  Avoid having throw rugs at the top or bottom of the stairs. If you do have throw rugs, attach them to the floor with carpet tape.  Make sure that you have a light switch at the top of the stairs and the bottom of the stairs. If you do not have them, ask someone to add them for you. What else can I do to help prevent  falls?  Wear shoes that:  Do not have high heels.  Have rubber bottoms.  Are comfortable and fit you well.  Are closed at the toe. Do not wear sandals.  If you use a stepladder:  Make sure that it is fully opened. Do not climb a closed stepladder.  Make sure that both sides of the stepladder are locked into place.  Ask someone to hold it for you, if possible.  Clearly mark and make sure that you can see:  Any grab bars or handrails.  First and last steps.  Where the edge of each step is.  Use tools that help you move around (mobility aids) if they are needed. These include:  Canes.  Walkers.  Scooters.  Crutches.  Turn on the lights when you go into a dark area. Replace any light bulbs as soon as they burn out.  Set up your furniture so you have a clear path. Avoid moving your furniture around.  If any of your floors are uneven, fix them.  If there are any pets around you, be aware of where they are.  Review your medicines with your doctor. Some medicines can make you feel dizzy. This can increase your chance of falling. Ask your doctor what other things that you can do to help prevent falls. This information is not intended to replace advice given to you by your health care provider. Make sure you discuss any questions you have with your health care provider. Document Released: 06/19/2009 Document Revised: 01/29/2016 Document Reviewed: 09/27/2014 Elsevier Interactive Patient Education  2017 [employer].

## 2021-01-05 DIAGNOSIS — I10 Essential (primary) hypertension: Secondary | ICD-10-CM | POA: Diagnosis not present

## 2021-01-05 DIAGNOSIS — Z79891 Long term (current) use of opiate analgesic: Secondary | ICD-10-CM | POA: Diagnosis not present

## 2021-01-05 DIAGNOSIS — K219 Gastro-esophageal reflux disease without esophagitis: Secondary | ICD-10-CM | POA: Diagnosis not present

## 2021-01-05 DIAGNOSIS — G8929 Other chronic pain: Secondary | ICD-10-CM | POA: Diagnosis not present

## 2021-01-05 DIAGNOSIS — E663 Overweight: Secondary | ICD-10-CM | POA: Diagnosis not present

## 2021-01-05 DIAGNOSIS — M2550 Pain in unspecified joint: Secondary | ICD-10-CM | POA: Diagnosis not present

## 2021-01-05 DIAGNOSIS — I252 Old myocardial infarction: Secondary | ICD-10-CM | POA: Diagnosis not present

## 2021-01-05 DIAGNOSIS — Z6826 Body mass index (BMI) 26.0-26.9, adult: Secondary | ICD-10-CM | POA: Diagnosis not present

## 2021-01-05 DIAGNOSIS — I2510 Atherosclerotic heart disease of native coronary artery without angina pectoris: Secondary | ICD-10-CM | POA: Diagnosis not present

## 2021-01-05 DIAGNOSIS — J309 Allergic rhinitis, unspecified: Secondary | ICD-10-CM | POA: Diagnosis not present

## 2021-01-05 DIAGNOSIS — Z79899 Other long term (current) drug therapy: Secondary | ICD-10-CM | POA: Diagnosis not present

## 2021-01-05 DIAGNOSIS — E890 Postprocedural hypothyroidism: Secondary | ICD-10-CM | POA: Diagnosis not present

## 2021-01-09 ENCOUNTER — Other Ambulatory Visit: Payer: Self-pay | Admitting: Family Medicine

## 2021-01-13 ENCOUNTER — Other Ambulatory Visit: Payer: Self-pay

## 2021-01-13 ENCOUNTER — Ambulatory Visit (INDEPENDENT_AMBULATORY_CARE_PROVIDER_SITE_OTHER): Payer: Medicare HMO | Admitting: Family Medicine

## 2021-01-13 ENCOUNTER — Encounter: Payer: Self-pay | Admitting: Family Medicine

## 2021-01-13 VITALS — BP 142/80 | HR 82 | Temp 97.6°F | Ht 63.0 in | Wt 146.0 lb

## 2021-01-13 DIAGNOSIS — E559 Vitamin D deficiency, unspecified: Secondary | ICD-10-CM

## 2021-01-13 DIAGNOSIS — E7800 Pure hypercholesterolemia, unspecified: Secondary | ICD-10-CM | POA: Diagnosis not present

## 2021-01-13 DIAGNOSIS — M159 Polyosteoarthritis, unspecified: Secondary | ICD-10-CM | POA: Diagnosis not present

## 2021-01-13 DIAGNOSIS — I10 Essential (primary) hypertension: Secondary | ICD-10-CM

## 2021-01-13 DIAGNOSIS — E039 Hypothyroidism, unspecified: Secondary | ICD-10-CM | POA: Diagnosis not present

## 2021-01-13 DIAGNOSIS — I25118 Atherosclerotic heart disease of native coronary artery with other forms of angina pectoris: Secondary | ICD-10-CM | POA: Diagnosis not present

## 2021-01-13 DIAGNOSIS — K219 Gastro-esophageal reflux disease without esophagitis: Secondary | ICD-10-CM

## 2021-01-13 NOTE — Patient Instructions (Addendum)
Annual exam in office with MD end September, or early October, call if you need me before  Cmp and EGFr, vit D today  Please continue to be careful not to fall    Thanks for choosing [facility], we consider it a privelige to serve you.

## 2021-01-13 NOTE — Progress Notes (Signed)
   [name]     MRN: [id number]      DOB: 09-29-1933   HPI [name] is here for follow up and re-evaluation of chronic medical conditions, medication management and review of any available recent lab and radiology data.  Preventive health is updated, specifically  Cancer screening and Immunization.   Increasing back pain and reduced mobility, but working with pain c close;ly and is pleased Increased stress as a daughter is vey ill and will not accept treatment and not keep ion touch The PT denies any adverse reactions to current medications since the last visit.  Eating less but sufficent, notes weight loss, denies change in stool or abdominal pain  ROS Denies recent fever or chills. Denies sinus pressure, nasal congestion, ear pain or sore throat. Denies chest congestion, productive cough or wheezing. Denies chest pains, palpitations and leg swelling Denies abdominal pain, nausea, vomiting,diarrhea or constipation.   Denies dysuria, frequency, hesitancy or incontinence. . Denies uncontrolled depression, anxiety or insomnia. Denies skin break down or rash.   PE  BP (!) 145/86 (BP Location: Left Arm, Patient Position: Sitting, Cuff Size: Normal)   Pulse 82   Temp 97.6 F (36.4 C) (Temporal)   Ht 5\' 3"  (1.6 m)   Wt 146 lb (66.2 kg)   SpO2 96%   BMI 25.86 kg/m   Patient alert and oriented and in no cardiopulmonary distress.  HEENT: No facial asymmetry, EOMI,     Neck supple .  Chest: Clear to auscultation bilaterally.  CVS: S1, S2 no murmurs, no S3.Regular rate.  ABD: Soft non tender.   Ext: No edema  MS: markedly reduced  ROM spine, shoulders, hips and knees.  Skin: Intact, no ulcerations or rash noted.  Psych: Good eye contact, normal affect. Memory intact not anxious or depressed appearing.  CNS: CN 2-12 intact, power,  normal throughout.no focal deficits noted.   Assessment & Plan  Essential hypertension Slightly elevated at visit, no change ,  c/o increased pain DASH diet and commitment to daily physical activity for a minimum of 30 minutes discussed and encouraged, as a part of hypertension management. The importance of attaining a healthy weight is also discussed.  BP/Weight 01/13/2021 12/31/2020 10/22/2020 09/16/2020 06/03/2020 05/29/2020 6/73/4193  Systolic BP 790 - 240 - 973 532 992  Diastolic BP 80 - 70 - 70 75 67  Wt. (Lbs) 146 - 148 152 162.04 160 170  BMI 25.86 - 26.22 26.93 28.7 29.26 31.09       Hypothyroid Controlled, no change in medication   Hyperlipidemia Hyperlipidemia:Low fat diet discussed and encouraged.   Lipid Panel  Lab Results  Component Value Date   CHOL 170 10/22/2020   HDL 48 10/22/2020   LDLCALC 94 10/22/2020   TRIG 162 (H) 10/22/2020   CHOLHDL 3.5 10/22/2020  needs to reduce fatty foods   GERD Controlled, no change in medication   Osteoarthritis Generalized and severe, home safety and fall prevention discussed  Coronary atherosclerosis No s/s of unstable angina, denies chest pain , stable

## 2021-01-14 ENCOUNTER — Ambulatory Visit: Payer: Medicare HMO | Admitting: Family Medicine

## 2021-01-14 ENCOUNTER — Encounter: Payer: Self-pay | Admitting: Family Medicine

## 2021-01-14 LAB — CMP14+EGFR
ALT: 12 IU/L (ref 0–32)
AST: 25 IU/L (ref 0–40)
Albumin/Globulin Ratio: 1.5 (ref 1.2–2.2)
Albumin: 4.1 g/dL (ref 3.6–4.6)
Alkaline Phosphatase: 98 IU/L (ref 44–121)
BUN/Creatinine Ratio: 10 — ABNORMAL LOW (ref 12–28)
BUN: 10 mg/dL (ref 8–27)
Bilirubin Total: 0.5 mg/dL (ref 0.0–1.2)
CO2: 25 mmol/L (ref 20–29)
Calcium: 9.4 mg/dL (ref 8.7–10.3)
Chloride: 101 mmol/L (ref 96–106)
Creatinine, Ser: 1.02 mg/dL — ABNORMAL HIGH (ref 0.57–1.00)
Globulin, Total: 2.8 g/dL (ref 1.5–4.5)
Glucose: 85 mg/dL (ref 65–99)
Potassium: 3.5 mmol/L (ref 3.5–5.2)
Sodium: 142 mmol/L (ref 134–144)
Total Protein: 6.9 g/dL (ref 6.0–8.5)
eGFR: 54 mL/min/{1.73_m2} — ABNORMAL LOW (ref >59)

## 2021-01-14 LAB — VITAMIN D 25 HYDROXY (VIT D DEFICIENCY, FRACTURES): Vit D, 25-Hydroxy: 30.8 ng/mL (ref 30.0–100.0)

## 2021-01-14 NOTE — Assessment & Plan Note (Signed)
No s/s of unstable angina, denies chest pain , stable

## 2021-01-14 NOTE — Assessment & Plan Note (Signed)
Hyperlipidemia:Low fat diet discussed and encouraged.   Lipid Panel  Lab Results  Component Value Date   CHOL 170 10/22/2020   HDL 48 10/22/2020   LDLCALC 94 10/22/2020   TRIG 162 (H) 10/22/2020   CHOLHDL 3.5 10/22/2020  needs to reduce fatty foods

## 2021-01-14 NOTE — Assessment & Plan Note (Signed)
Controlled, no change in medication  

## 2021-01-14 NOTE — Assessment & Plan Note (Signed)
Generalized and severe, home safety and fall prevention discussed

## 2021-01-14 NOTE — Assessment & Plan Note (Signed)
Slightly elevated at visit, no change , c/o increased pain DASH diet and commitment to daily physical activity for a minimum of 30 minutes discussed and encouraged, as a part of hypertension management. The importance of attaining a healthy weight is also discussed.  BP/Weight 01/13/2021 12/31/2020 10/22/2020 09/16/2020 06/03/2020 05/29/2020 8/88/2800  Systolic BP 349 - 179 - 150 569 794  Diastolic BP 80 - 70 - 70 75 67  Wt. (Lbs) 146 - 148 152 162.04 160 170  BMI 25.86 - 26.22 26.93 28.7 29.26 31.09

## 2021-01-20 DIAGNOSIS — E7800 Pure hypercholesterolemia, unspecified: Secondary | ICD-10-CM | POA: Diagnosis not present

## 2021-01-21 LAB — LIPID PANEL
Chol/HDL Ratio: 3.4 ratio (ref 0.0–4.4)
Cholesterol, Total: 169 mg/dL (ref 100–199)
HDL: 49 mg/dL (ref >39)
LDL Chol Calc (NIH): 97 mg/dL (ref 0–99)
Triglycerides: 130 mg/dL (ref 0–149)
VLDL Cholesterol Cal: 23 mg/dL (ref 5–40)

## 2021-01-22 ENCOUNTER — Other Ambulatory Visit: Payer: Self-pay | Admitting: *Deleted

## 2021-01-22 MED ORDER — ATORVASTATIN CALCIUM 80 MG PO TABS: 80.0000 mg | ORAL_TABLET | Freq: Every day | ORAL | 3 refills | Status: DC

## 2021-01-23 DIAGNOSIS — Z79899 Other long term (current) drug therapy: Secondary | ICD-10-CM | POA: Diagnosis not present

## 2021-01-23 DIAGNOSIS — Z79891 Long term (current) use of opiate analgesic: Secondary | ICD-10-CM | POA: Diagnosis not present

## 2021-01-23 DIAGNOSIS — M47816 Spondylosis without myelopathy or radiculopathy, lumbar region: Secondary | ICD-10-CM | POA: Diagnosis not present

## 2021-01-23 DIAGNOSIS — M189 Osteoarthritis of first carpometacarpal joint, unspecified: Secondary | ICD-10-CM | POA: Diagnosis not present

## 2021-01-23 DIAGNOSIS — G894 Chronic pain syndrome: Secondary | ICD-10-CM | POA: Diagnosis not present

## 2021-01-23 DIAGNOSIS — M1710 Unilateral primary osteoarthritis, unspecified knee: Secondary | ICD-10-CM | POA: Diagnosis not present

## 2021-01-26 ENCOUNTER — Ambulatory Visit (HOSPITAL_COMMUNITY)
Admission: RE | Admit: 2021-01-26 | Discharge: 2021-01-26 | Disposition: A | Discharge status: Home / Self Care | Payer: Medicare HMO | Source: Ambulatory Visit | Attending: Family Medicine | Admitting: Family Medicine

## 2021-01-26 ENCOUNTER — Other Ambulatory Visit: Payer: Self-pay

## 2021-01-26 DIAGNOSIS — Z1231 Encounter for screening mammogram for malignant neoplasm of breast: Secondary | ICD-10-CM | POA: Diagnosis not present

## 2021-02-08 ENCOUNTER — Other Ambulatory Visit: Payer: Self-pay | Admitting: Family Medicine

## 2021-02-19 DIAGNOSIS — M189 Osteoarthritis of first carpometacarpal joint, unspecified: Secondary | ICD-10-CM | POA: Diagnosis not present

## 2021-02-19 DIAGNOSIS — M1710 Unilateral primary osteoarthritis, unspecified knee: Secondary | ICD-10-CM | POA: Diagnosis not present

## 2021-02-19 DIAGNOSIS — M47816 Spondylosis without myelopathy or radiculopathy, lumbar region: Secondary | ICD-10-CM | POA: Diagnosis not present

## 2021-02-19 DIAGNOSIS — G894 Chronic pain syndrome: Secondary | ICD-10-CM | POA: Diagnosis not present

## 2021-03-19 DIAGNOSIS — G894 Chronic pain syndrome: Secondary | ICD-10-CM | POA: Diagnosis not present

## 2021-03-19 DIAGNOSIS — M1710 Unilateral primary osteoarthritis, unspecified knee: Secondary | ICD-10-CM | POA: Diagnosis not present

## 2021-03-19 DIAGNOSIS — M189 Osteoarthritis of first carpometacarpal joint, unspecified: Secondary | ICD-10-CM | POA: Diagnosis not present

## 2021-03-19 DIAGNOSIS — M47816 Spondylosis without myelopathy or radiculopathy, lumbar region: Secondary | ICD-10-CM | POA: Diagnosis not present

## 2021-04-01 ENCOUNTER — Ambulatory Visit: Payer: Medicare HMO

## 2021-04-03 ENCOUNTER — Ambulatory Visit (INDEPENDENT_AMBULATORY_CARE_PROVIDER_SITE_OTHER): Payer: Medicare HMO

## 2021-04-03 ENCOUNTER — Other Ambulatory Visit: Payer: Self-pay

## 2021-04-03 DIAGNOSIS — Z23 Encounter for immunization: Secondary | ICD-10-CM

## 2021-04-23 ENCOUNTER — Encounter (INDEPENDENT_AMBULATORY_CARE_PROVIDER_SITE_OTHER): Payer: Medicare HMO | Admitting: Ophthalmology

## 2021-05-09 ENCOUNTER — Other Ambulatory Visit: Payer: Self-pay | Admitting: Family Medicine

## 2021-05-13 ENCOUNTER — Telehealth: Payer: Self-pay

## 2021-05-13 NOTE — Telephone Encounter (Signed)
[facility] forms received  Copied Noted Sleeved

## 2021-05-21 DIAGNOSIS — Z0279 Encounter for issue of other medical certificate: Secondary | ICD-10-CM

## 2021-05-22 DIAGNOSIS — G894 Chronic pain syndrome: Secondary | ICD-10-CM | POA: Diagnosis not present

## 2021-05-22 DIAGNOSIS — M47816 Spondylosis without myelopathy or radiculopathy, lumbar region: Secondary | ICD-10-CM | POA: Diagnosis not present

## 2021-05-22 DIAGNOSIS — M189 Osteoarthritis of first carpometacarpal joint, unspecified: Secondary | ICD-10-CM | POA: Diagnosis not present

## 2021-05-22 DIAGNOSIS — M1710 Unilateral primary osteoarthritis, unspecified knee: Secondary | ICD-10-CM | POA: Diagnosis not present

## 2021-05-26 NOTE — Telephone Encounter (Signed)
Complete  Tried to reach daughter to let her know

## 2021-06-02 ENCOUNTER — Encounter: Payer: Medicare HMO | Admitting: Family Medicine

## 2021-06-15 DIAGNOSIS — H524 Presbyopia: Secondary | ICD-10-CM | POA: Diagnosis not present

## 2021-06-15 DIAGNOSIS — H35351 Cystoid macular degeneration, right eye: Secondary | ICD-10-CM | POA: Diagnosis not present

## 2021-06-15 DIAGNOSIS — H25813 Combined forms of age-related cataract, bilateral: Secondary | ICD-10-CM | POA: Diagnosis not present

## 2021-06-15 DIAGNOSIS — H52203 Unspecified astigmatism, bilateral: Secondary | ICD-10-CM | POA: Diagnosis not present

## 2021-06-15 DIAGNOSIS — H5203 Hypermetropia, bilateral: Secondary | ICD-10-CM | POA: Diagnosis not present

## 2021-06-19 DIAGNOSIS — Z79891 Long term (current) use of opiate analgesic: Secondary | ICD-10-CM | POA: Diagnosis not present

## 2021-06-19 DIAGNOSIS — M189 Osteoarthritis of first carpometacarpal joint, unspecified: Secondary | ICD-10-CM | POA: Diagnosis not present

## 2021-06-19 DIAGNOSIS — Z79899 Other long term (current) drug therapy: Secondary | ICD-10-CM | POA: Diagnosis not present

## 2021-06-19 DIAGNOSIS — M1710 Unilateral primary osteoarthritis, unspecified knee: Secondary | ICD-10-CM | POA: Diagnosis not present

## 2021-06-19 DIAGNOSIS — M47816 Spondylosis without myelopathy or radiculopathy, lumbar region: Secondary | ICD-10-CM | POA: Diagnosis not present

## 2021-06-19 DIAGNOSIS — G894 Chronic pain syndrome: Secondary | ICD-10-CM | POA: Diagnosis not present

## 2021-07-23 ENCOUNTER — Encounter: Payer: Self-pay | Admitting: Family Medicine

## 2021-07-23 ENCOUNTER — Other Ambulatory Visit: Payer: Self-pay

## 2021-07-23 ENCOUNTER — Ambulatory Visit (INDEPENDENT_AMBULATORY_CARE_PROVIDER_SITE_OTHER): Payer: Medicare HMO | Admitting: Family Medicine

## 2021-07-23 VITALS — BP 137/82 | HR 89 | Resp 17 | Ht 61.0 in | Wt 144.1 lb

## 2021-07-23 DIAGNOSIS — E7800 Pure hypercholesterolemia, unspecified: Secondary | ICD-10-CM

## 2021-07-23 DIAGNOSIS — M5440 Lumbago with sciatica, unspecified side: Secondary | ICD-10-CM

## 2021-07-23 DIAGNOSIS — R7303 Prediabetes: Secondary | ICD-10-CM | POA: Diagnosis not present

## 2021-07-23 DIAGNOSIS — I10 Essential (primary) hypertension: Secondary | ICD-10-CM | POA: Diagnosis not present

## 2021-07-23 DIAGNOSIS — R197 Diarrhea, unspecified: Secondary | ICD-10-CM | POA: Diagnosis not present

## 2021-07-23 DIAGNOSIS — E039 Hypothyroidism, unspecified: Secondary | ICD-10-CM

## 2021-07-23 DIAGNOSIS — Z23 Encounter for immunization: Secondary | ICD-10-CM | POA: Diagnosis not present

## 2021-07-23 MED ORDER — DIPHENOXYLATE-ATROPINE 2.5-0.025 MG PO TABS: 1.0000 | ORAL_TABLET | Freq: Four times a day (QID) | ORAL | 0 refills | Status: DC | PRN

## 2021-07-23 NOTE — Progress Notes (Signed)
   [name]     MRN: [id number]      DOB: 1933/11/26   HPI [name] is here for follow up and re-evaluation of chronic medical conditions, medication management and review of any available recent lab and radiology data.  Preventive health is updated, specifically  Cancer screening and Immunization.   Questions or concerns regarding consultations or procedures which the PT has had in the interim are  addressed. The PT denies any adverse reactions to current medications since the last visit.  3 dy h/o loose stool, avg 5 since this morning, no nausea or vomit, chills or fever Chronic back pain, debilitating, followed by pain clinic, will not take as many pain pills as prescribed, states too much, will discuss with pain management   ROS Denies recent fever or chills. Denies sinus pressure, nasal congestion, ear pain or sore throat. Denies chest congestion, productive cough or wheezing. Denies chest pains, palpitations and leg swelling Denies dysuria, frequency, hesitancy or incontinence.  Denies headaches, seizures, numbness, or tingling. Denies uncontrolled depression, anxiety or insomnia. Denies skin break down or rash.   PE  BP 137/82   Pulse 89   Resp 17   Ht 5\' 1"  (1.549 m)   Wt 144 lb 1.9 oz (65.4 kg)   SpO2 98%   BMI 27.23 kg/m   Patient alert and oriented and in no cardiopulmonary distress.  HEENT: No facial asymmetry, EOMI,     Neck supple .  Chest: Clear to auscultation bilaterally.  CVS: S1, S2 no murmurs, no S3.Regular rate.  ABD: Soft superficial tenderness and hyperactive BS   Ext: No edema  MS: decreased ROM spine, shoulders, hips and knees.  Skin: Intact, no ulcerations or rash noted.  Psych: Good eye contact, normal affect. Memory intact not anxious or depressed appearing.  CNS: CN 2-12 intact, power,  normal throughout.no focal deficits noted.   Assessment & Plan  Essential hypertension Controlled, no change in medication DASH diet  and commitment to daily physical activity for a minimum of 30 minutes discussed and encouraged, as a part of hypertension management. The importance of attaining a healthy weight is also discussed.  BP/Weight 07/23/2021 01/13/2021 12/31/2020 10/22/2020 09/16/2020 06/03/2020 03/12/8674  Systolic BP 449 201 - 007 - 121 975  Diastolic BP 82 80 - 70 - 70 75  Wt. (Lbs) 144.12 146 - 148 152 162.04 160  BMI 27.23 25.86 - 26.22 26.93 28.7 29.26       Hyperlipidemia Hyperlipidemia:Low fat diet discussed and encouraged.   Lipid Panel  Lab Results  Component Value Date   CHOL 169 01/20/2021   HDL 49 01/20/2021   LDLCALC 97 01/20/2021   TRIG 130 01/20/2021   CHOLHDL 3.4 01/20/2021       Diarrhea Lomotil prescribed in limited supply  Back pain of lumbar region with sciatica Increased and uncontrolled, managed by pain clinic

## 2021-07-23 NOTE — Assessment & Plan Note (Addendum)
Hyperlipidemia:Low fat diet discussed and encouraged.   Lipid Panel  Lab Results  Component Value Date   CHOL 169 01/20/2021   HDL 49 01/20/2021   LDLCALC 97 01/20/2021   TRIG 130 01/20/2021   CHOLHDL 3.4 01/20/2021    Updated lab needed at/ before next visit.

## 2021-07-23 NOTE — Assessment & Plan Note (Signed)
Lomotil prescribed in limited supply

## 2021-07-23 NOTE — Assessment & Plan Note (Signed)
Increased and uncontrolled, managed by pain clinic

## 2021-07-23 NOTE — Patient Instructions (Signed)
Annual exam tomorrow as before , call if you need me sooner  Flu vaccine today    cBC, lipid, cmp and eGFr, TSH  today  Be careful not to fall  Thanks for choosing [facility], we consider it a privelige to serve you.

## 2021-07-23 NOTE — Assessment & Plan Note (Signed)
Controlled, no change in medication DASH diet and commitment to daily physical activity for a minimum of 30 minutes discussed and encouraged, as a part of hypertension management. The importance of attaining a healthy weight is also discussed.  BP/Weight 07/23/2021 01/13/2021 12/31/2020 10/22/2020 09/16/2020 06/03/2020 3/75/4360  Systolic BP 677 034 - 035 - 248 185  Diastolic BP 82 80 - 70 - 70 75  Wt. (Lbs) 144.12 146 - 148 152 162.04 160  BMI 27.23 25.86 - 26.22 26.93 28.7 29.26

## 2021-07-24 ENCOUNTER — Ambulatory Visit (INDEPENDENT_AMBULATORY_CARE_PROVIDER_SITE_OTHER): Payer: Medicare HMO | Admitting: Family Medicine

## 2021-07-24 ENCOUNTER — Telehealth: Payer: Self-pay | Admitting: Family Medicine

## 2021-07-24 ENCOUNTER — Encounter: Payer: Self-pay | Admitting: Family Medicine

## 2021-07-24 VITALS — BP 134/80 | HR 92 | Resp 17 | Ht 61.0 in | Wt 144.0 lb

## 2021-07-24 DIAGNOSIS — Z0000 Encounter for general adult medical examination without abnormal findings: Secondary | ICD-10-CM

## 2021-07-24 LAB — LIPID PANEL
Chol/HDL Ratio: 3.7 ratio (ref 0.0–4.4)
Cholesterol, Total: 208 mg/dL — ABNORMAL HIGH (ref 100–199)
HDL: 56 mg/dL (ref >39)
LDL Chol Calc (NIH): 137 mg/dL — ABNORMAL HIGH (ref 0–99)
Triglycerides: 86 mg/dL (ref 0–149)
VLDL Cholesterol Cal: 15 mg/dL (ref 5–40)

## 2021-07-24 LAB — CBC
Hematocrit: 39.6 % (ref 34.0–46.6)
Hemoglobin: 12.6 g/dL (ref 11.1–15.9)
MCH: 28.6 pg (ref 26.6–33.0)
MCHC: 31.8 g/dL (ref 31.5–35.7)
MCV: 90 fL (ref 79–97)
Platelets: 240 10*3/uL (ref 150–450)
RBC: 4.41 x10E6/uL (ref 3.77–5.28)
RDW: 13.1 % (ref 11.7–15.4)
WBC: 5.1 10*3/uL (ref 3.4–10.8)

## 2021-07-24 LAB — CMP14+EGFR
ALT: 13 IU/L (ref 0–32)
AST: 27 IU/L (ref 0–40)
Albumin/Globulin Ratio: 1.8 (ref 1.2–2.2)
Albumin: 4.4 g/dL (ref 3.6–4.6)
Alkaline Phosphatase: 117 IU/L (ref 44–121)
BUN/Creatinine Ratio: 11 — ABNORMAL LOW (ref 12–28)
BUN: 11 mg/dL (ref 8–27)
Bilirubin Total: 0.5 mg/dL (ref 0.0–1.2)
CO2: 25 mmol/L (ref 20–29)
Calcium: 9 mg/dL (ref 8.7–10.3)
Chloride: 99 mmol/L (ref 96–106)
Creatinine, Ser: 0.99 mg/dL (ref 0.57–1.00)
Globulin, Total: 2.4 g/dL (ref 1.5–4.5)
Glucose: 84 mg/dL (ref 70–99)
Potassium: 4.2 mmol/L (ref 3.5–5.2)
Sodium: 140 mmol/L (ref 134–144)
Total Protein: 6.8 g/dL (ref 6.0–8.5)
eGFR: 56 mL/min/{1.73_m2} — ABNORMAL LOW (ref >59)

## 2021-07-24 LAB — TSH: TSH: 5.85 u[IU]/mL — ABNORMAL HIGH (ref 0.450–4.500)

## 2021-07-24 MED ORDER — EZETIMIBE 10 MG PO TABS: 10.0000 mg | ORAL_TABLET | Freq: Every day | ORAL | 1 refills | Status: DC

## 2021-07-24 NOTE — Telephone Encounter (Signed)
Lvm that the dr was seeing her mother now and if she wanted me to relay a message to call me back and I would let the provider know

## 2021-07-24 NOTE — Telephone Encounter (Signed)
Pt daughter called back in regards to pt   Advised pt that she is not on pt DPR

## 2021-07-24 NOTE — Patient Instructions (Addendum)
  F/U in 4 months, call if you need me sooner  Fasting lipid, cmp and EGFr  Blood pressure, kidney and liver function are good  Please make sure that you take your atorvastatin and ezetemide every day as prescribed , your cholesterol is high.  No change in dose of thyroid medication, please make sure you take daily  Increase pain medication since you continue to have uncontrolled pain and your pain management team recommends this  Thanks for choosing [facility], we consider it a privelige to serve you.  Best for 2022 Ensure you keep up with eye exams

## 2021-07-24 NOTE — Telephone Encounter (Signed)
Called on behalf of pt  Wants to update on a few thing that pt will not bring up .  Daughter is concerned

## 2021-07-24 NOTE — Progress Notes (Signed)
    [name]     MRN: [id number]      DOB: 1934-06-20  HPI: Patient is in for annual physical exam.  Recent labs,  are reviewed. Immunization is reviewed , and  updated if needed.   PE: BP 134/80   Pulse 92   Resp 17   Ht 5\' 1"  (1.549 m)   Wt 144 lb (65.3 kg)   SpO2 98%   BMI 27.21 kg/m   Pleasant  female, alert and oriented x 3, in no cardio-pulmonary distress. Afebrile. HEENT No facial trauma or asymetry. Sinuses non tender.  Extra occullar muscles intact.. External ears normal, . Neck: decreased ROM, no adenopathy,JVD or thyromegaly.No bruits.  Chest: Clear to ascultation bilaterally.No crackles or wheezes. Non tender to palpation  Cardiovascular system; Heart sounds normal,  S1 and  S2 ,no S3.  No murmur, or thrill. Apical beat not displaced Peripheral pulses normal.  Abdomen: Soft, non tender,No guarding, tenderness or rebound.    Musculoskeletal exam: Markedly reduced  ROM of spine, hips , shoulders and knees. deformity ,swelling and  crepitus noted. Marked disturbance in gait No muscle wasting or atrophy.   Neurologic: Cranial nerves 2 to 12 intact. Power, and tone ,decreased  Marked  disturbance in gait. No tremor.  Skin: Intact, no ulceration, erythema , scaling or rash noted. Pigmentation normal throughout  Psych; Normal mood and affect. Judgement and concentration normal   Assessment & Plan:  Annual physical exam Annual exam as documented. Counseling done  re healthy lifestyle involving commitment to 150 minutes exercise per week, heart healthy diet, and attaining healthy weight.The importance of adequate sleep also discussed. Regular seat belt use and home safety, is also discussed.  Immunization and cancer screening needs are specifically addressed at this visit.

## 2021-07-26 ENCOUNTER — Encounter: Payer: Self-pay | Admitting: Family Medicine

## 2021-07-26 NOTE — Assessment & Plan Note (Signed)
Annual exam as documented. Counseling done  re healthy lifestyle involving commitment to 150 minutes exercise per week, heart healthy diet, and attaining healthy weight.The importance of adequate sleep also discussed. Regular seat belt use and home safety, is also discussed.  Immunization and cancer screening needs are specifically addressed at this visit.

## 2021-07-28 NOTE — Telephone Encounter (Signed)
Per the Pt when she was in the office checking out , she said that it was none of her daughters business. And that she would call phyllis to advise, I told [name] for [place] to speak with [name] she would need to be on the [facility], that only her grandchild was on the current DPR. Pt said to leave it as it was.   I attempted to call [name] to see if she wanted to change things, and there was no answer

## 2021-07-29 NOTE — Telephone Encounter (Signed)
Patient called and she said she was going to speak to her daughter [name].  Patient called to apologize to Dr Moshe Cipro for her daughter calling and taking up her time.  Patient said if she feels like if something is going on she would inform her daughter. She wants to wish Dr Moshe Cipro and her family a Happy Thanksgiving.

## 2021-10-07 IMAGING — DX DG FINGER MIDDLE 2+V*R*
3 series · 4 of 4 positions shown · non-contrast
Comparison: 01/09/2020

CLINICAL DATA: Laceration, bleeding

EXAM:
RIGHT MIDDLE FINGER 2+V

[finger ap]
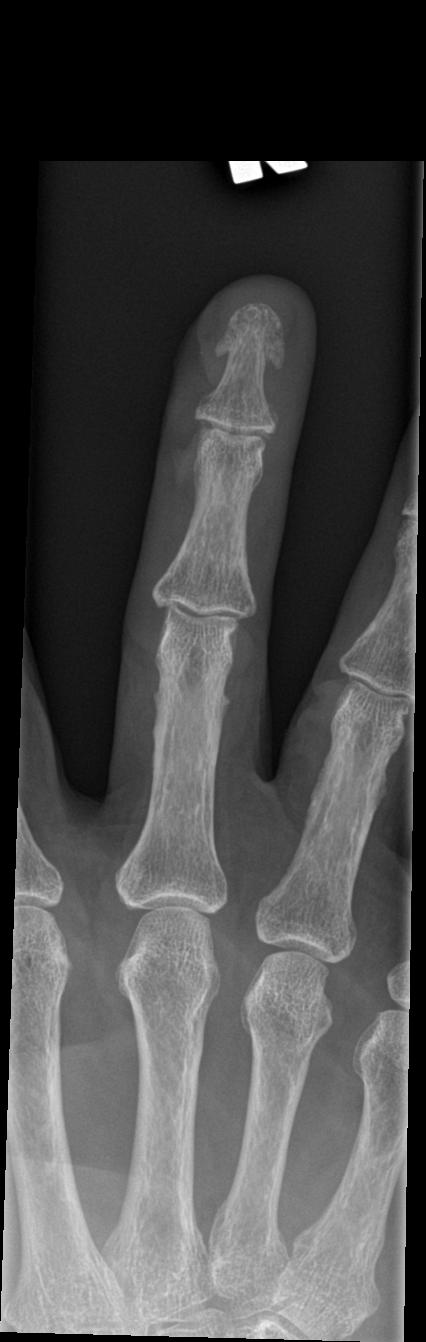

[Series 2: finger obl · 0.14mm/px · 2 of 2 slices shown]
[im 1/2]
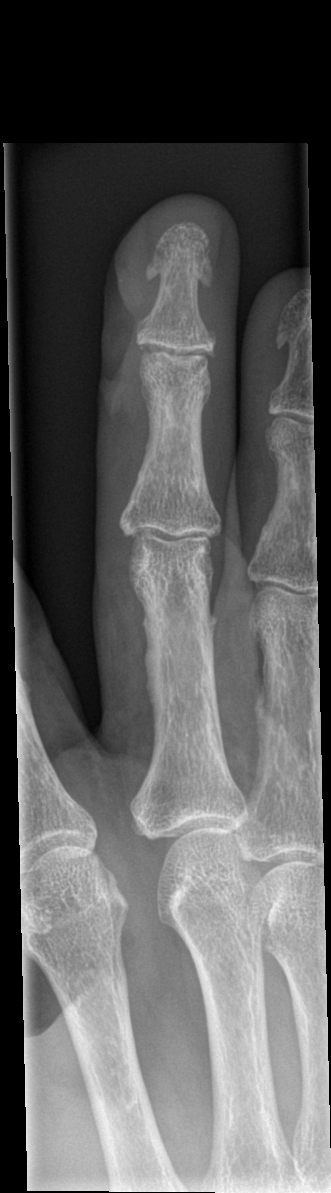
[im 2/2]
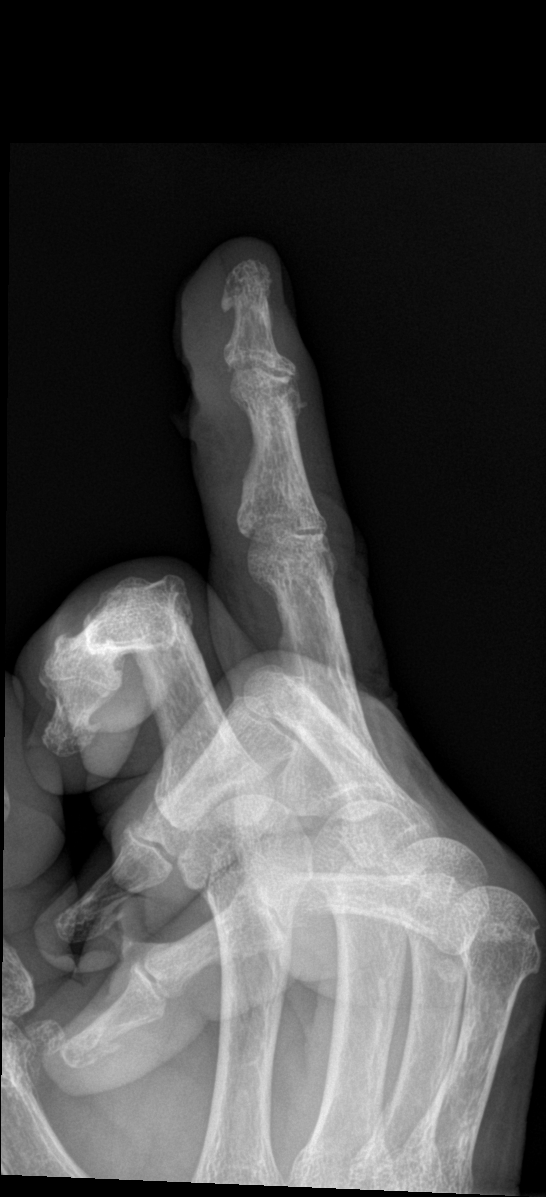

[finger lat]
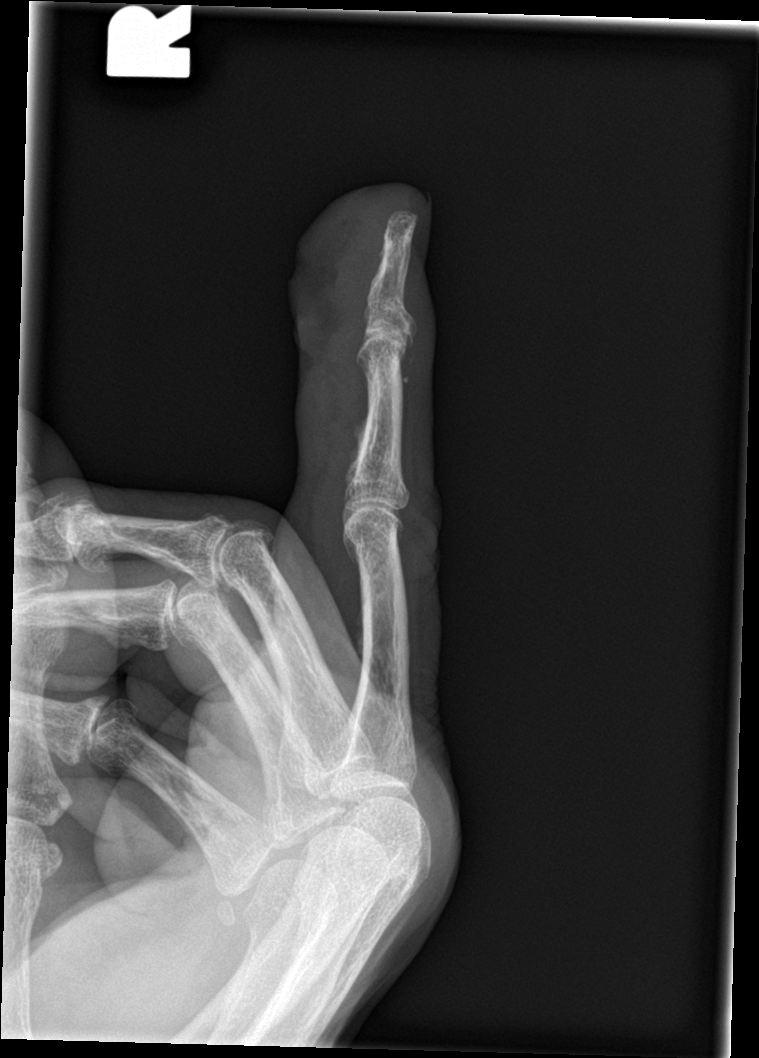

[4 of 4 positions shown; findings below may reference images not displayed]

FINDINGS: Frontal, oblique, and lateral views of the right third digit are
obtained. Soft tissue laceration volar aspect distal tuft of the
third digit. No underlying fracture. No radiopaque foreign body.
Stable osteoarthritis of the interphalangeal joints, greatest
distally.
IMPRESSION: 1. Soft tissue laceration volar aspect distal margin third digit. No
fracture or radiopaque foreign body.

## 2021-11-13 ENCOUNTER — Other Ambulatory Visit: Payer: Self-pay | Admitting: Family Medicine

## 2021-11-20 ENCOUNTER — Other Ambulatory Visit: Payer: Self-pay

## 2021-11-20 ENCOUNTER — Ambulatory Visit (INDEPENDENT_AMBULATORY_CARE_PROVIDER_SITE_OTHER): Payer: Medicare Other | Admitting: Family Medicine

## 2021-11-20 ENCOUNTER — Encounter: Payer: Self-pay | Admitting: Family Medicine

## 2021-11-20 VITALS — BP 158/95 | HR 83 | Resp 16 | Ht <= 58 in | Wt 147.0 lb

## 2021-11-20 DIAGNOSIS — M5430 Sciatica, unspecified side: Secondary | ICD-10-CM | POA: Insufficient documentation

## 2021-11-20 DIAGNOSIS — E039 Hypothyroidism, unspecified: Secondary | ICD-10-CM

## 2021-11-20 DIAGNOSIS — I10 Essential (primary) hypertension: Secondary | ICD-10-CM

## 2021-11-20 DIAGNOSIS — R197 Diarrhea, unspecified: Secondary | ICD-10-CM

## 2021-11-20 DIAGNOSIS — G894 Chronic pain syndrome: Secondary | ICD-10-CM

## 2021-11-20 DIAGNOSIS — M1711 Unilateral primary osteoarthritis, right knee: Secondary | ICD-10-CM | POA: Insufficient documentation

## 2021-11-20 DIAGNOSIS — R7303 Prediabetes: Secondary | ICD-10-CM

## 2021-11-20 DIAGNOSIS — E559 Vitamin D deficiency, unspecified: Secondary | ICD-10-CM

## 2021-11-20 DIAGNOSIS — Z79891 Long term (current) use of opiate analgesic: Secondary | ICD-10-CM | POA: Insufficient documentation

## 2021-11-20 DIAGNOSIS — E7800 Pure hypercholesterolemia, unspecified: Secondary | ICD-10-CM | POA: Diagnosis not present

## 2021-11-20 DIAGNOSIS — M47816 Spondylosis without myelopathy or radiculopathy, lumbar region: Secondary | ICD-10-CM | POA: Insufficient documentation

## 2021-11-20 MED ORDER — HYDROCHLOROTHIAZIDE 25 MG PO TABS: 25.0000 mg | ORAL_TABLET | Freq: Every day | ORAL | 5 refills | Status: DC

## 2021-11-20 NOTE — Patient Instructions (Signed)
F/u in 10 weeks, re evaluate blood pressure ? ?PLEASE bring medicATION TO NEXT VISIT ? ?DOSE INCREASE IN HCtz 25 MG ONE DAILY, BLOOD PRESSURE IS TOO HIGH ? ? ?NEED COVID BOOSTER ? ?NEED SHINGRIX VACCINES ? ?FASTING LIPID,CMP AND EgfR, tsh AND VITAMIN D FIRST WEEK IN April ? ?CAREFUL NOT TO FALL ? ?Thanks for choosing [facility], we consider it a privelige to serve you. ? ?

## 2021-11-22 NOTE — Progress Notes (Signed)
? ?  [name]     MRN: [id number]      DOB: 1934/04/13 ? ? ?HPI ?[name] is here for follow up and re-evaluation of chronic medical conditions, medication management and review of any available recent lab and radiology data.  ?Preventive health is updated, specifically  Cancer screening and Immunization.   ?Questions or concerns regarding consultations or procedures which the PT has had in the interim are  addressed. ?The PT denies any adverse reactions to current medications since the last visit.  ?There are no new concerns. Stays worried and concerned about 1 daughter who has significant mental illness and will not comply with treatment ?[name] often feels alone and misses her parents, does confirm great support from her children  ? ?ROS ?Denies recent fever or chills. ?Denies sinus pressure, nasal congestion, ear pain or sore throat. ?Denies chest congestion, productive cough or wheezing. ?Denies chest pains, palpitations and leg swelling ?Denies abdominal pain, nausea, vomiting,diarrhea or constipation.   ?Denies dysuria, frequency, hesitancy or incontinence. ?Denies uncontrolled  joint pain and swelling and she does have  limitation in mobility. ?Denies headaches, seizures, numbness, or tingling. ?Denies depression, anxiety or insomnia. ?Denies skin break down or rash. ? ? ?PE ? ?BP (!) 158/95   Pulse 83   Resp 16   Ht '4\' 10"'$  (1.473 m)   Wt 147 lb 0.6 oz (66.7 kg)   SpO2 94%   BMI 30.73 kg/m?  ? ?Patient alert and oriented and in no cardiopulmonary distress. ? ?HEENT: No facial asymmetry, EOMI,     Neck supple . ? ?Chest: Clear to auscultation bilaterally. ? ?CVS: S1, S2 no murmurs, no S3.Regular rate. ? ?ABD: Soft non tender.  ? ?Ext: No edema ? ?MS: decreased  ROM spine, shoulders, hips and knees. ? ?Skin: Intact, no ulcerations or rash noted. ? ?Psych: Good eye contact, normal affect. Memory intact not anxious or depressed appearing. ? ?CNS: CN 2-12 intact, power,  normal throughout.no  focal deficits noted. ? ? ?Assessment & Plan ? ?Essential hypertension ?DASH diet and commitment to daily physical activity for a minimum of 30 minutes discussed and encouraged, as a part of hypertension management. ?The importance of attaining a healthy weight is also discussed. ? ?BP/Weight 11/20/2021 07/24/2021 07/23/2021 01/13/2021 12/31/2020 10/22/2020 09/16/2020  ?Systolic BP 836 629 476 546 - 122 -  ?Diastolic BP 95 80 82 80 - 70 -  ?Wt. (Lbs) 147.04 144 144.12 146 - 148 152  ?BMI 30.73 27.21 27.23 25.86 - 26.22 26.93  ? ? ?Uncontrolled , inc HCTZ and re eval in 10 weeks ? ? ?Hyperlipidemia ?Hyperlipidemia:Low fat diet discussed and encouraged. ? ? ?Lipid Panel  ?Lab Results  ?Component Value Date  ? CHOL 208 (H) 07/23/2021  ? HDL 56 07/23/2021  ? LDLCALC 137 (H) 07/23/2021  ? TRIG 86 07/23/2021  ? CHOLHDL 3.7 07/23/2021  ? ? ? ?Updated lab needed at/ before next visit. ?Not at goal needs to reduce fat intake ? ?Hypothyroid ?Controlled, no change in medication ?Updated lab needed at/ before next visit. ? ? ?Chronic pain syndrome ?Managed by pain clinic ? ?Diarrhea ?Managed with lomotil ? ?

## 2021-11-22 NOTE — Assessment & Plan Note (Signed)
Hyperlipidemia:Low fat diet discussed and encouraged. ? ? ?Lipid Panel  ?Lab Results  ?Component Value Date  ? CHOL 208 (H) 07/23/2021  ? HDL 56 07/23/2021  ? LDLCALC 137 (H) 07/23/2021  ? TRIG 86 07/23/2021  ? CHOLHDL 3.7 07/23/2021  ? ? ? ?Updated lab needed at/ before next visit. ?Not at goal needs to reduce fat intake ?

## 2021-11-22 NOTE — Assessment & Plan Note (Signed)
DASH diet and commitment to daily physical activity for a minimum of 30 minutes discussed and encouraged, as a part of hypertension management. ?The importance of attaining a healthy weight is also discussed. ? ?BP/Weight 11/20/2021 07/24/2021 07/23/2021 01/13/2021 12/31/2020 10/22/2020 09/16/2020  ?Systolic BP 431 427 670 110 - 122 -  ?Diastolic BP 95 80 82 80 - 70 -  ?Wt. (Lbs) 147.04 144 144.12 146 - 148 152  ?BMI 30.73 27.21 27.23 25.86 - 26.22 26.93  ? ? ?Uncontrolled , inc HCTZ and re eval in 10 weeks ? ?

## 2021-11-22 NOTE — Assessment & Plan Note (Signed)
Managed with lomotil ?

## 2021-11-22 NOTE — Assessment & Plan Note (Signed)
Managed by pain clinic 

## 2021-11-22 NOTE — Assessment & Plan Note (Signed)
Controlled, no change in medication Updated lab needed at/ before next visit.  

## 2021-12-06 DIAGNOSIS — Z20828 Contact with and (suspected) exposure to other viral communicable diseases: Secondary | ICD-10-CM | POA: Diagnosis not present

## 2021-12-10 ENCOUNTER — Ambulatory Visit (INDEPENDENT_AMBULATORY_CARE_PROVIDER_SITE_OTHER): Payer: Medicare HMO | Admitting: Family Medicine

## 2021-12-10 ENCOUNTER — Encounter: Payer: Self-pay | Admitting: Family Medicine

## 2021-12-10 VITALS — BP 184/108 | HR 78 | Resp 16 | Ht <= 58 in | Wt 148.1 lb

## 2021-12-10 DIAGNOSIS — E039 Hypothyroidism, unspecified: Secondary | ICD-10-CM | POA: Diagnosis not present

## 2021-12-10 DIAGNOSIS — Z79891 Long term (current) use of opiate analgesic: Secondary | ICD-10-CM | POA: Diagnosis not present

## 2021-12-10 DIAGNOSIS — E7800 Pure hypercholesterolemia, unspecified: Secondary | ICD-10-CM

## 2021-12-10 DIAGNOSIS — I10 Essential (primary) hypertension: Secondary | ICD-10-CM | POA: Diagnosis not present

## 2021-12-10 NOTE — Patient Instructions (Addendum)
F/U in 1 week re evaluate blood pressure with MD, bring aLL medication that you are taking to the visit ? ?Please commit to taking every day at the same time for your blood pressure, hydrochlorthiazide, amlodipine an isosorbide mono nitrate ? ?Please get help fixing your weekly pill  box and get family to help with this ? ?Hopefully stress will soon get less, getting your daughter involved to help is necessary and it is good she is here  ? ? ?Letter provided for your daughter to work from home permanenetly  ? ?FMLA form will be completed and you will be contacted ? ?Thanks for choosing [facility], we consider it a privelige to serve you. ? ?

## 2021-12-14 ENCOUNTER — Other Ambulatory Visit: Payer: Self-pay | Admitting: *Deleted

## 2021-12-14 ENCOUNTER — Telehealth: Payer: Self-pay | Admitting: Family Medicine

## 2021-12-14 DIAGNOSIS — Z0279 Encounter for issue of other medical certificate: Secondary | ICD-10-CM

## 2021-12-14 MED ORDER — HYDROCHLOROTHIAZIDE 25 MG PO TABS: 25.0000 mg | ORAL_TABLET | Freq: Every day | ORAL | 5 refills | Status: DC

## 2021-12-14 NOTE — Telephone Encounter (Signed)
Patient needs refill on  ? ?hydrochlorothiazide (HYDRODIURIL) 25 MG tablet  ?

## 2021-12-14 NOTE — Telephone Encounter (Signed)
Medication sent to pharmacy  

## 2021-12-16 DIAGNOSIS — G894 Chronic pain syndrome: Secondary | ICD-10-CM | POA: Diagnosis not present

## 2021-12-16 DIAGNOSIS — M47816 Spondylosis without myelopathy or radiculopathy, lumbar region: Secondary | ICD-10-CM | POA: Diagnosis not present

## 2021-12-16 DIAGNOSIS — M79644 Pain in right finger(s): Secondary | ICD-10-CM | POA: Diagnosis not present

## 2021-12-16 DIAGNOSIS — M1711 Unilateral primary osteoarthritis, right knee: Secondary | ICD-10-CM | POA: Diagnosis not present

## 2021-12-16 DIAGNOSIS — Z79891 Long term (current) use of opiate analgesic: Secondary | ICD-10-CM | POA: Diagnosis not present

## 2021-12-18 ENCOUNTER — Encounter: Payer: Self-pay | Admitting: Family Medicine

## 2021-12-18 ENCOUNTER — Ambulatory Visit (INDEPENDENT_AMBULATORY_CARE_PROVIDER_SITE_OTHER): Payer: Medicare HMO | Admitting: Family Medicine

## 2021-12-18 VITALS — BP 120/70 | HR 96 | Resp 16 | Ht <= 58 in | Wt 148.0 lb

## 2021-12-18 DIAGNOSIS — E039 Hypothyroidism, unspecified: Secondary | ICD-10-CM

## 2021-12-18 DIAGNOSIS — E7800 Pure hypercholesterolemia, unspecified: Secondary | ICD-10-CM

## 2021-12-18 DIAGNOSIS — I10 Essential (primary) hypertension: Secondary | ICD-10-CM

## 2021-12-18 DIAGNOSIS — Z79891 Long term (current) use of opiate analgesic: Secondary | ICD-10-CM

## 2021-12-18 DIAGNOSIS — E559 Vitamin D deficiency, unspecified: Secondary | ICD-10-CM | POA: Diagnosis not present

## 2021-12-18 NOTE — Patient Instructions (Signed)
Please keep May follow up visit. ? ?Blood pressure is excellent, stay on current medications ? ?Please bring ALL of your medications to yourt next visit ? ?Please allow your grandson , Percell Miller, to come into the visit with you  ? ?Labs today ? ?Be careful not to fall ? ?Thanks for choosing [facility], we consider it a privelige to serve you. ? ?

## 2021-12-19 LAB — VITAMIN D 25 HYDROXY (VIT D DEFICIENCY, FRACTURES): Vit D, 25-Hydroxy: 35.4 ng/mL (ref 30.0–100.0)

## 2021-12-19 LAB — LIPID PANEL
Chol/HDL Ratio: 3.5 ratio (ref 0.0–4.4)
Cholesterol, Total: 193 mg/dL (ref 100–199)
HDL: 55 mg/dL (ref >39)
LDL Chol Calc (NIH): 122 mg/dL — ABNORMAL HIGH (ref 0–99)
Triglycerides: 87 mg/dL (ref 0–149)
VLDL Cholesterol Cal: 16 mg/dL (ref 5–40)

## 2021-12-19 LAB — CMP14+EGFR
ALT: 13 IU/L (ref 0–32)
AST: 33 IU/L (ref 0–40)
Albumin/Globulin Ratio: 1.7 (ref 1.2–2.2)
Albumin: 4.4 g/dL (ref 3.6–4.6)
Alkaline Phosphatase: 89 IU/L (ref 44–121)
BUN/Creatinine Ratio: 11 — ABNORMAL LOW (ref 12–28)
BUN: 12 mg/dL (ref 8–27)
Bilirubin Total: 0.6 mg/dL (ref 0.0–1.2)
CO2: 27 mmol/L (ref 20–29)
Calcium: 9.6 mg/dL (ref 8.7–10.3)
Chloride: 98 mmol/L (ref 96–106)
Creatinine, Ser: 1.05 mg/dL — ABNORMAL HIGH (ref 0.57–1.00)
Globulin, Total: 2.6 g/dL (ref 1.5–4.5)
Glucose: 91 mg/dL (ref 70–99)
Potassium: 4.4 mmol/L (ref 3.5–5.2)
Sodium: 139 mmol/L (ref 134–144)
Total Protein: 7 g/dL (ref 6.0–8.5)
eGFR: 52 mL/min/{1.73_m2} — ABNORMAL LOW (ref >59)

## 2021-12-19 LAB — TSH: TSH: 5.29 u[IU]/mL — ABNORMAL HIGH (ref 0.450–4.500)

## 2021-12-20 ENCOUNTER — Encounter: Payer: Self-pay | Admitting: Family Medicine

## 2021-12-20 MED ORDER — LEVOTHYROXINE SODIUM 25 MCG PO TABS: ORAL_TABLET | ORAL | 3 refills | Status: DC

## 2021-12-20 NOTE — Assessment & Plan Note (Signed)
Hyperlipidemia:Low fat diet discussed and encouraged. ? ? ?Lipid Panel  ?Lab Results  ?Component Value Date  ? CHOL 193 12/18/2021  ? HDL 55 12/18/2021  ? LDLCALC 122 (H) 12/18/2021  ? TRIG 87 12/18/2021  ? CHOLHDL 3.5 12/18/2021  ? ? ? ?Needs to reduce dietary fat, LDL elevated ?

## 2021-12-20 NOTE — Assessment & Plan Note (Signed)
Under corrected, dose inc to 1. Tabs Fri thru Sunday, continue one daily on Monday through Thursday ?

## 2021-12-20 NOTE — Progress Notes (Signed)
? ?  [name]     MRN: [id number]      DOB: 01/10/1934 ? ? ?HPI ?[name] is here for follow up and re-evaluation of chronic medical conditions, medication management and review of any available recent lab and radiology data.  ?[name] does not have her meds, and unfortunately is evidently not taking them as prescribed as her BP remains elevated , she tearfully insists that she is capable of and wants to live independently and care for herself ?Her daughter from out of [place] is with her , is concerned about med management esp re opiates, and also is aware that her Mother is losing the ability to manage her health independently ?Daughter rquests letter for her job enabling her to permanently work from home so that she can care for her Mom ?ROS ?Denies recent fever or chills. ?Denies sinus pressure, nasal congestion, ear pain or sore throat. ?Denies chest congestion, productive cough or wheezing. ?Denies chest pains, palpitations an. She becomes tearful, hr daughter is with her, and [name] maintains that she is able to ans wants to take good care of herselfd leg swelling ?Denies abdominal pain, nausea, vomiting,diarrhea or constipation.   ?Denies dysuria, frequency, hesitancy or incontinence. ?C/o chronic  joint pain, swelling and limitation in mobility. ?Denies headaches, seizures, numbness, or tingling. ?Denies uncontrolled depression, anxiety or insomnia. ?Denies skin break down or rash. ? ? ?PE ? ?BP (!) 184/108   Pulse 78   Resp 16   Ht '4\' 10"'$  (1.473 m)   Wt 148 lb 1.9 oz (67.2 kg)   SpO2 97%   BMI 30.96 kg/m?  ? ?Patient alert and oriented and in no cardiopulmonary distress. ? ?HEENT: No facial asymmetry, EOMI,     Neck supple . ? ?Chest: Clear to auscultation bilaterally. ? ?CVS: S1, S2 no murmurs, no S3.Regular rate. ? ?ABD: Soft non tender.  ? ?Ext: No edema ? ?Kaitlyn: markedly decreased  ROM spine, shoulders, hips and knees. ? ?Skin: Intact, no ulcerations or rash noted. ? ?Psych: Good eye  contact, normal affect. Mildly  anxious and depressed appearing. ? ?CNS: CN 2-12 intact, power,  normal throughout.no focal deficits noted. ? ? ?Assessment & Plan ? ?Essential hypertension ?Uncontrolled, likely not taking meds as prescribed. Discussed in detail with pt and her daughter the risks of uncontrolled hypertension and med non compliance. Improved supervision by family member needed and is agred upon, she will return for re eval in 1 week ? ?Hyperlipidemia ?Updated lab needed at/ before next visit. ? ? ?Hypothyroid ?Updated lab needed at/ before next visit. ? ? ?Long term (current) use of opiate analgesic ?Reports not taking as prescribed , taked less , daughter will accompany her to visit to have this addressed ? ?

## 2021-12-20 NOTE — Assessment & Plan Note (Signed)
Reports not taking as prescribed , taked less , daughter will accompany her to visit to have this addressed ?

## 2021-12-20 NOTE — Assessment & Plan Note (Signed)
Controlled, no change in medication ?DASH diet and commitment to daily physical activity for a minimum of 30 minutes discussed and encouraged, as a part of hypertension management. ?The importance of attaining a healthy weight is also discussed. ? ? ?  12/18/2021  ? 11:03 AM 12/10/2021  ?  2:27 PM 11/20/2021  ? 10:41 AM 07/24/2021  ? 10:04 AM 07/23/2021  ? 11:57 AM 07/23/2021  ? 11:15 AM 01/13/2021  ?  2:26 PM  ?BP/Weight  ?Systolic BP 257 505 183 358 137 155 142  ?Diastolic BP 70 251 95 80 82 91 80  ?Wt. (Lbs) 148 148.12 147.04 144  144.12   ?BMI 30.93 kg/m2 30.96 kg/m2 30.73 kg/m2 27.21 kg/m2  27.23 kg/m2   ? ? ? ? ?

## 2021-12-20 NOTE — Assessment & Plan Note (Signed)
Managed by pain clinic, adequate control ?

## 2021-12-20 NOTE — Assessment & Plan Note (Signed)
Updated lab needed at/ before next visit.   

## 2021-12-20 NOTE — Assessment & Plan Note (Signed)
Uncontrolled, likely not taking meds as prescribed. Discussed in detail with pt and her daughter the risks of uncontrolled hypertension and med non compliance. Improved supervision by family member needed and is agred upon, she will return for re eval in 1 week ?

## 2021-12-20 NOTE — Progress Notes (Signed)
? ?  [name]     MRN: [id number]      DOB: Jun 01, 1934 ? ? ?HPI ?[name] is here for follow up and re-evaluation of chronic medical conditions, specifically uncontrolled HTN ?She is accompanied by her [name] states that he has been keeping a close eye on her an dthat she is taking her meds ?She denies adverse s/e specifically denies light headeness, leg swelling , cough or palpitations ? ?ROS: ?Denies recent fever or chills. ?Denies sinus pressure, nasal congestion, ear pain or sore throat. ?Denies chest congestion, productive cough or wheezing. ?Denies chest pains, palpitations and leg swelling ?Denies abdominal pain, nausea, vomiting,diarrhea or constipation.   ?Denies dysuria, frequency, hesitancy or incontinence. ?C/o chronic  joint pain,  and limitation in mobility. ?Denies headaches, seizures, numbness, or tingling. ?Denies uncontrolled depression, anxiety or insomnia. ?Denies skin break down or rash. ? ? ?PE ? ?BP 120/70   Pulse 96   Resp 16   Ht '4\' 10"'$  (1.473 m)   Wt 148 lb (67.1 kg)   SpO2 94%   BMI 30.93 kg/m?  ? ?Patient alert and oriented and in no cardiopulmonary distress. ? ?HEENT: No facial asymmetry, EOMI,     Neck supple . ? ?Chest: Clear to auscultation bilaterally. ? ?CVS: S1, S2 no murmurs, no S3.Regular rate. ? ?ABD: Soft non tender.  ? ?Ext: No edema ? ?MS: decreased  ROM spine, shoulders, hips and knees. ? ?Skin: Intact, no ulcerations or rash noted. ? ?Psych: Good eye contact, normal affect. Memory intact not anxious or depressed appearing. ? ?CNS: CN 2-12 intact, power,  normal throughout.no focal deficits noted. ? ? ?Assessment & Plan ? ?Essential hypertension ?Controlled, no change in medication ?DASH diet and commitment to daily physical activity for a minimum of 30 minutes discussed and encouraged, as a part of hypertension management. ?The importance of attaining a healthy weight is also discussed. ? ? ?  12/18/2021  ? 11:03 AM 12/10/2021  ?  2:27 PM 11/20/2021  ?  10:41 AM 07/24/2021  ? 10:04 AM 07/23/2021  ? 11:57 AM 07/23/2021  ? 11:15 AM 01/13/2021  ?  2:26 PM  ?BP/Weight  ?Systolic BP 580 998 338 250 137 155 142  ?Diastolic BP 70 539 95 80 82 91 80  ?Wt. (Lbs) 148 148.12 147.04 144  144.12   ?BMI 30.93 kg/m2 30.96 kg/m2 30.73 kg/m2 27.21 kg/m2  27.23 kg/m2   ? ? ? ? ? ?Hypothyroid ?Under corrected, dose inc to 1. Tabs Fri thru Sunday, continue one daily on Monday through Thursday ? ?Hyperlipidemia ?Hyperlipidemia:Low fat diet discussed and encouraged. ? ? ?Lipid Panel  ?Lab Results  ?Component Value Date  ? CHOL 193 12/18/2021  ? HDL 55 12/18/2021  ? LDLCALC 122 (H) 12/18/2021  ? TRIG 87 12/18/2021  ? CHOLHDL 3.5 12/18/2021  ? ? ? ?Needs to reduce dietary fat, LDL elevated ? ?Long term (current) use of opiate analgesic ?Managed by pain clinic, adequate control ? ?

## 2022-01-04 ENCOUNTER — Ambulatory Visit (INDEPENDENT_AMBULATORY_CARE_PROVIDER_SITE_OTHER): Payer: Medicare HMO

## 2022-01-04 DIAGNOSIS — Z0000 Encounter for general adult medical examination without abnormal findings: Secondary | ICD-10-CM | POA: Diagnosis not present

## 2022-01-04 NOTE — Progress Notes (Signed)
Subjective:   [name] is a 88 y.o. female who presents for Medicare Annual (Subsequent) preventive examination.  Review of Systems    I connected with  Chrislynn Mosely on 01/04/22 by a audio enabled telemedicine application and verified that I am speaking with the correct person using two identifiers.  Patient Location: Home  Provider Location: Office/Clinic  I discussed the limitations of evaluation and management by telemedicine. The patient expressed understanding and agreed to proceed.  Cardiac Risk Factors include: none     Objective:    Today's Vitals   01/04/22 1134  PainSc: 7   PainLoc: Back   There is no height or weight on file to calculate BMI.     01/04/2022   11:39 AM 12/31/2020   11:11 AM 05/29/2020    8:04 PM 02/02/2019   11:23 PM 02/02/2019    7:44 PM 01/17/2019   10:30 AM 12/22/2018    9:04 AM  Advanced Directives  Does Patient Have a Medical Advance Directive? Yes Yes No No No No Yes  Type of Advance Directive Living will [place];Living will       Does patient want to make changes to medical advance directive? Yes (Inpatient - patient defers changing a medical advance directive and declines information at this time) No - Patient declined       Copy of [place] in Chart?  No - copy requested       Would patient like information on creating a medical advance directive?    No - Patient declined No - Patient declined      Current Medications (verified) Outpatient Encounter Medications as of 01/04/2022  Medication Sig   amLODipine (NORVASC) 10 MG tablet TAKE 1 TABLET(10 MG) BY MOUTH DAILY   Ascorbic Acid (VITAMIN C) 500 MG tablet Take 500 mg by mouth daily.   aspirin EC 81 MG tablet Take 81 mg by mouth daily.   calcium carbonate (OS-CAL - DOSED IN MG OF ELEMENTAL CALCIUM) 1250 MG tablet Take 1 tablet by mouth daily.   Cyanocobalamin (B-12) 1000 MCG CAPS Take 1 capsule by mouth daily.   diclofenac sodium  (VOLTAREN) 1 % GEL Apply 2 g topically 2 (two) times daily as needed (for back pain).   EPINEPHrine 0.3 mg/0.3 mL IJ SOAJ injection INJECT INTO THE MUSCLE AS NEEDED   ezetimibe (ZETIA) 10 MG tablet Take 1 tablet (10 mg total) by mouth daily.   fluticasone (FLONASE) 50 MCG/ACT nasal spray SHAKE LIQUID AND USE 1 SPRAY IN EACH NOSTRIL DAILY   hydrochlorothiazide (HYDRODIURIL) 25 MG tablet Take 1 tablet (25 mg total) by mouth daily.   isosorbide mononitrate (IMDUR) 30 MG 24 hr tablet Take 1 tablet (30 mg total) by mouth daily.   levothyroxine (SYNTHROID) 25 MCG tablet Take one tablet by mouth on Monday through Thursday, and one and a half tablets on Friday, Saturday and sunday   Multiple Vitamins-Minerals (CENTRUM SILVER 50+WOMEN) TABS Take 1 tablet by mouth.   nitroGLYCERIN (NITROLINGUAL) 0.4 MG/SPRAY spray Place 1 spray under the tongue every 5 (five) minutes x 3 doses as needed for chest pain.   oxyCODONE-acetaminophen (PERCOCET) 7.5-325 MG tablet Take 1 tablet by mouth 3 (three) times daily as needed for severe pain.   potassium gluconate 595 (99 K) MG TABS tablet Take 1 tablet (595 mg total) by mouth every evening.   No facility-administered encounter medications on file as of 01/04/2022.    Allergies (verified) Ace inhibitors, Beef-derived products, Bupropion  hcl, Codeine, Penicillins, Angiotensin receptor blockers, Bee venom, Lambs quarters, and Pork-derived products   History: Past Medical History:  Diagnosis Date   Anemia    Blood clot associated with vein wall inflammation    hx of in right leg   CAD (coronary artery disease)    Carpal tunnel syndrome on left    Left Hand pain with numbness, new onset    Chronic back pain    Chronic back pain    buldging disc   Constipation    Diarrhea    Difficult intubation    small airway   DJD (degenerative joint disease)    Severe   Gastric ulcer    hx of    GERD (gastroesophageal reflux disease)    takes Omeprazole daily    Headache(784.0)    occasionally   Hyperlipidemia    takes Lipitor nightly   Hypertension    takes Amlodipine and Metoprolol daily   Nocturia    Obesity    Pneumonia    hx of-in 2011   Shortness of breath    with exertion   Sleep apnea    sleep study done 19yr ago   Thyroid mass    right   Past Surgical History:  Procedure Laterality Date   ABDOMINAL HYSTERECTOMY     APPENDECTOMY  1987   Arthoscopy left knee  1992   Arthroscopy left shoulder  1999   Back  Surgery  lumbar  1989 /1999   x 2   BACK SURGERY     CARDIAC CATHETERIZATION  05/2010   CARDIAC CATHETERIZATION N/A 09/30/2015   Procedure: Left Heart Cath and Coronary Angiography;  Surgeon: TTroy Sine MD;  Location: [facility];  Service: Cardiovascular;  Laterality: N/A;   Carpal tunnel release right     x 2   CHOLECYSTECTOMY     COLONOSCOPY     ESOPHAGOGASTRODUODENOSCOPY     LUMBAR FUSION  March 19, 2014   NM MYOCAR PERF WALL MOTION  11/12/2008   no ischemia   PARTIAL HYSTERECTOMY  1972   ROTATOR CUFF REPAIR     right   SPINE SURGERY     THYROIDECTOMY  11/17/2011   Procedure: THYROIDECTOMY;  Surgeon: SAscencion Dike MD;  Location: [facility];  Service: ENT;  Laterality: Right;  WITH FROZEN SECTION   TONSILLECTOMY     Family History  Problem Relation Age of Onset   Anuerysm Mother        died at 822  Stroke Father        944at death    Anesthesia problems Neg Hx    Hypotension Neg Hx    Malignant hyperthermia Neg Hx    Pseudochol deficiency Neg Hx    Allergic rhinitis Neg Hx    Angioedema Neg Hx    Asthma Neg Hx    Atopy Neg Hx    Eczema Neg Hx    Immunodeficiency Neg Hx    Urticaria Neg Hx    Social History   Socioeconomic History   Marital status: Widowed    Spouse name: Not on file   Number of children: 4   Years of education: Not on file   Highest education level: Not on file  Occupational History    Employer: RETIRED  Tobacco Use   Smoking status: Former    Types: Cigarettes    Quit  date: 09/06/1984    Years since quitting: 37.3   Smokeless tobacco: Never  Vaping Use  Vaping Use: Never used  Substance and Sexual Activity   Alcohol use: No    Comment: Casual   Drug use: No   Sexual activity: Never    Birth control/protection: Surgical  Other Topics Concern   Not on file  Social History Narrative   Not on file   Social Determinants of Health   Financial Resource Strain: Low Risk    Difficulty of Paying Living Expenses: Not hard at all  Food Insecurity: No Food Insecurity   Worried About Charity fundraiser in the Last Year: Never true   [place] in the Last Year: Never true  Transportation Needs: No Transportation Needs   Lack of Transportation (Medical): No   Lack of Transportation (Non-Medical): No  Physical Activity: Insufficiently Active   Days of Exercise per Week: 7 days   Minutes of Exercise per Session: 20 min  Stress: No Stress Concern Present   Feeling of Stress : Not at all  Social Connections: Moderately Isolated   Frequency of Communication with Friends and Family: More than three times a week   Frequency of Social Gatherings with Friends and Family: More than three times a week   Attends Religious Services: 1 to 4 times per year   Active Member of [employer] or Organizations: No   Attends Archivist Meetings: Never   Marital Status: Widowed    Tobacco Counseling Counseling given: Not Answered   Clinical Intake:  Pre-visit preparation completed: Yes Ms. Carbine , Thank you for taking time to come for your Medicare Wellness Visit. I appreciate your ongoing commitment to your health goals. Please review the following plan we discussed and let me know if I can assist you in the future.   These are the goals we discussed:  Goals      DIET - INCREASE WATER INTAKE     Drinks several bottles of water everyday     Exercise 3x per week (30 min per time)     LIFESTYLE - DECREASE FALLS RISK     Patient Stated     Stay  healthy     Prevent falls     Uses cane when ambulating since she is alone a lot of the time at home          This is a list of the screening recommended for you and due dates:  Health Maintenance  Topic Date Due   Zoster (Shingles) Vaccine (1 of 2) Never done   COVID-19 Vaccine (5 - Booster for Pfizer series) 05/29/2021   Flu Shot  04/06/2022   Tetanus Vaccine  05/29/2030   Pneumonia Vaccine  Completed   DEXA scan (bone density measurement)  Completed   HPV Vaccine  Aged Out     Pain : 0-10 Pain Score: 7  Pain Type: Chronic pain Pain Location: Back Pain Orientation: Lower Pain Descriptors / Indicators: Aching, Sharp Pain Onset: More than a month ago Pain Frequency: Constant     BMI - recorded: 30.94 Nutritional Status: BMI > 30  Obese Nutritional Risks: None Diabetes: No  How often do you need to have someone help you when you read instructions, pamphlets, or other written materials from your doctor or pharmacy?: 1 - Never What is the last grade level you completed in school?: 12  Diabetic?no  Interpreter Needed?: No      Activities of Daily Living    01/04/2022   11:39 AM  In your present state of health, do you have  any difficulty performing the following activities:  Hearing? 0  Vision? 1  Difficulty concentrating or making decisions? 0  Walking or climbing stairs? 0  Dressing or bathing? 0  Doing errands, shopping? 0  Preparing Food and eating ? N  Using the Toilet? N  In the past six months, have you accidently leaked urine? N  Do you have problems with loss of bowel control? N  Managing your Medications? N  Managing your Finances? N  Housekeeping or managing your Housekeeping? N    Patient Care Team: Fayrene Helper, MD as PCP - General Croitoru, Dani Gobble, MD as PCP - Cardiology (Cardiology) Leta Baptist, MD as Attending Physician (Otolaryngology) Daneil Dolin, MD as Attending Physician (Gastroenterology) Latanya Maudlin, MD as Consulting  Physician (Orthopedic Surgery) Croitoru, Dani Gobble, MD as Consulting Physician (Cardiology)  Indicate any recent Medical Services you may have received from other than [facility] providers in the past year (date may be approximate).     Assessment:   This is a routine wellness examination for Natayla.  Hearing/Vision screen No results found.  Dietary issues and exercise activities discussed: Current Exercise Habits: Home exercise routine, Type of exercise: walking, Time (Minutes): 10, Frequency (Times/Week): 7, Weekly Exercise (Minutes/Week): 70, Intensity: Mild, Exercise limited by: None identified   Goals Addressed             This Visit's Progress    Patient Stated       Stay healthy      Depression Screen    01/04/2022   11:39 AM 01/04/2022   11:37 AM 07/24/2021   10:06 AM 07/23/2021   11:16 AM 01/13/2021    1:47 PM 12/31/2020   11:14 AM 06/03/2020    2:37 PM  PHQ 2/9 Scores  PHQ - 2 Score 0 0 0 0 1 0 1  PHQ- 9 Score   0 0       Fall Risk    01/04/2022   11:39 AM 07/24/2021   10:06 AM 07/23/2021   11:16 AM 01/13/2021    1:47 PM 12/31/2020   11:14 AM  Fall Risk   Falls in the past year? 0 0 0 0 0  Number falls in past yr: 0 0 0 0 0  Injury with Fall? 0 0 0 0 0  Risk for fall due to : No Fall Risks   No Fall Risks Orthopedic patient  Follow up Falls evaluation completed   Falls evaluation completed Falls evaluation completed;Falls prevention discussed    FALL RISK PREVENTION PERTAINING TO THE HOME:  Any stairs in or around the home? Yes  If so, are there any without handrails? Yes  Home free of loose throw rugs in walkways, pet beds, electrical cords, etc? Yes  Adequate lighting in your home to reduce risk of falls? Yes   ASSISTIVE DEVICES UTILIZED TO PREVENT FALLS:  Life alert? Yes  Use of a cane, walker or w/c? Yes  Grab bars in the bathroom? Yes  Shower chair or bench in shower? Yes  Elevated toilet seat or a handicapped toilet? Yes       01/04/2022   11:40 AM  09/26/2017    2:39 PM 01/27/2017    3:38 PM  MMSE - Mini Mental State Exam  Not completed: Unable to complete    Orientation to time  5 5  Orientation to Place  5 5  Registration  3 3  Attention/ Calculation  5 5  Recall  0 3  Language- name 2 objects  2   Language- repeat  1   Language- follow 3 step command  3   Language- read & follow direction  1   Write a sentence  0   Copy design  1   Total score  26         01/04/2022   11:40 AM 12/24/2019    1:29 PM 12/22/2018    8:58 AM 12/22/2018    8:57 AM 12/19/2017    3:23 PM  6CIT Screen  What Year? 0 points 0 points 0 points  0 points  What month? 0 points 0 points 0 points  0 points  What time? 0 points 0 points 0 points 0 points 3 points  Count back from 20 0 points 0 points 0 points  0 points  Months in reverse 0 points 0 points 0 points  0 points  Repeat phrase 4 points 4 points 0 points  8 points  Total Score 4 points 4 points 0 points  11 points    Immunizations Immunization History  Administered Date(s) Administered   Fluad Quad(high Dose 65+) 05/28/2019, 06/03/2020, 07/23/2021   H1N1 10/01/2008   Influenza Split 05/18/2012   Influenza Whole 05/26/2007, 06/12/2009, 05/26/2011   Influenza,inj,Quad PF,6+ Mos 06/22/2013, 06/25/2014, 08/06/2015, 09/28/2016, 04/27/2017, 05/02/2018   PFIZER Comirnaty(Gray Top)Covid-19 Tri-Sucrose Vaccine 04/03/2021   PFIZER(Purple Top)SARS-COV-2 Vaccination 10/15/2019, 11/09/2019, 08/05/2020   Pneumococcal Conjugate-13 03/17/2015   Pneumococcal Polysaccharide-23 02/11/2004   Td 02/11/2004   Tdap 07/14/2013, 05/29/2020   Zoster, Live 12/26/2006    TDAP status: Up to date  Flu Vaccine status: Up to date  Pneumococcal vaccine status: Up to date  Covid-19 vaccine status: Completed vaccines  Qualifies for Shingles Vaccine? Yes   Zostavax completed No   Shingrix Completed?: No.    Education has been provided regarding the importance of this vaccine. Patient has been advised to call  insurance company to determine out of pocket expense if they have not yet received this vaccine. Advised may also receive vaccine at local pharmacy or Health Dept. Verbalized acceptance and understanding.  Screening Tests Health Maintenance  Topic Date Due   Zoster Vaccines- Shingrix (1 of 2) Never done   COVID-19 Vaccine (5 - Booster for Pfizer series) 05/29/2021   INFLUENZA VACCINE  04/06/2022   TETANUS/TDAP  05/29/2030   Pneumonia Vaccine 41+ Years old  Completed   DEXA SCAN  Completed   HPV VACCINES  Aged Out    Health Maintenance  Health Maintenance Due  Topic Date Due   Zoster Vaccines- Shingrix (1 of 2) Never done   COVID-19 Vaccine (5 - Booster for Pfizer series) 05/29/2021    Colorectal cancer screening: No longer required.   Mammogram status: No longer required due to age.  Bone Density status: Completed 11/20/20. Results reflect: Bone density results: OSTEOPENIA. Repeat every 2 years.  Lung Cancer Screening: (Low Dose CT Chest recommended if Age 72-80 years, 30 pack-year currently smoking OR have quit w/in 15years.) does not qualify.   Lung Cancer Screening Referral: no  Additional Screening:  Hepatitis C Screening: does not qualify; Completed   Vision Screening: Recommended annual ophthalmology exams for early detection of glaucoma and other disorders of the eye. Is the patient up to date with their annual eye exam?  Yes  Who is the provider or what is the name of the office in which the patient attends annual eye exams? Dr Katy Fitch  If pt is not established with a provider, would they like to be referred to a  provider to establish care? No .   Dental Screening: Recommended annual dental exams for proper oral hygiene  Community Resource Referral / Chronic Care Management: CRR required this visit?  No   CCM required this visit?  No      Plan:     I have personally reviewed and noted the following in the patient's chart:   Medical and social history Use  of alcohol, tobacco or illicit drugs  Current medications and supplements including opioid prescriptions.  Functional ability and status Nutritional status Physical activity Advanced directives List of other physicians Hospitalizations, surgeries, and ER visits in previous 12 months Vitals Screenings to include cognitive, depression, and falls Referrals and appointments  In addition, I have reviewed and discussed with patient certain preventive protocols, quality metrics, and best practice recommendations. A written personalized care plan for preventive services as well as general preventive health recommendations were provided to patient.     Quentin Angst, [place]   01/04/2022   Nurse Notes:

## 2022-01-04 NOTE — Patient Instructions (Signed)
[name] , Thank you for taking time to come for your Medicare Wellness Visit. I appreciate your ongoing commitment to your health goals. Please review the following plan we discussed and let me know if I can assist you in the future.   Screening recommendations/referrals: Colonoscopy: Complete Mammogram: Complete Bone Density: Complete Recommended yearly ophthalmology/optometry visit for glaucoma screening and checkup Recommended yearly dental visit for hygiene and checkup  Vaccinations: Influenza vaccine: Complete Pneumococcal vaccine: Complete Tdap vaccine: Complete Shingles vaccine: Due now    Advanced directives: Patient has in place.  Conditions/risks identified: Hypertension, Hyperlipidemia   Next appointment: 1 year   [name] , Thank you for taking time to come for your Medicare Wellness Visit. I appreciate your ongoing commitment to your health goals. Please review the following plan we discussed and let me know if I can assist you in the future.   These are the goals we discussed:  Goals      DIET - INCREASE WATER INTAKE     Drinks several bottles of water everyday     Exercise 3x per week (30 min per time)     LIFESTYLE - DECREASE FALLS RISK     Patient Stated     Stay healthy     Prevent falls     Uses cane when ambulating since she is alone a lot of the time at home          This is a list of the screening recommended for you and due dates:  Health Maintenance  Topic Date Due   Zoster (Shingles) Vaccine (1 of 2) Never done   COVID-19 Vaccine (5 - Booster for Pfizer series) 05/29/2021   Flu Shot  04/06/2022   Tetanus Vaccine  05/29/2030   Pneumonia Vaccine  Completed   DEXA scan (bone density measurement)  Completed   HPV Vaccine  Aged Out     Preventive Care 28 Years and Older, Female Preventive care refers to lifestyle choices and visits with your health care provider that can promote health and wellness. What does preventive care  include? A yearly physical exam. This is also called an annual well check. Dental exams once or twice a year. Routine eye exams. Ask your health care provider how often you should have your eyes checked. Personal lifestyle choices, including: Daily care of your teeth and gums. Regular physical activity. Eating a healthy diet. Avoiding tobacco and drug use. Limiting alcohol use. Practicing safe sex. Taking low-dose aspirin every day. Taking vitamin and mineral supplements as recommended by your health care provider. What happens during an annual well check? The services and screenings done by your health care provider during your annual well check will depend on your age, overall health, lifestyle risk factors, and family history of disease. Counseling  Your health care provider may ask you questions about your: Alcohol use. Tobacco use. Drug use. Emotional well-being. Home and relationship well-being. Sexual activity. Eating habits. History of falls. Memory and ability to understand (cognition). Work and work Statistician. Reproductive health. Screening  You may have the following tests or measurements: Height, weight, and BMI. Blood pressure. Lipid and cholesterol levels. These may be checked every 5 years, or more frequently if you are over 47 years old. Skin check. Lung cancer screening. You may have this screening every year starting at age 28 if you have a 30-pack-year history of smoking and currently smoke or have quit within the past 15 years. Fecal occult blood test (FOBT) of the stool.  You may have this test every year starting at age 20. Flexible sigmoidoscopy or colonoscopy. You may have a sigmoidoscopy every 5 years or a colonoscopy every 10 years starting at age 27. Hepatitis C blood test. Hepatitis B blood test. Sexually transmitted disease (STD) testing. Diabetes screening. This is done by checking your blood sugar (glucose) after you have not eaten for a while  (fasting). You may have this done every 1-3 years. Bone density scan. This is done to screen for osteoporosis. You may have this done starting at age 35. Mammogram. This may be done every 1-2 years. Talk to your health care provider about how often you should have regular mammograms. Talk with your health care provider about your test results, treatment options, and if necessary, the need for more tests. Vaccines  Your health care provider may recommend certain vaccines, such as: Influenza vaccine. This is recommended every year. Tetanus, diphtheria, and acellular pertussis (Tdap, Td) vaccine. You may need a Td booster every 10 years. Zoster vaccine. You may need this after age 78. Pneumococcal 13-valent conjugate (PCV13) vaccine. One dose is recommended after age 62. Pneumococcal polysaccharide (PPSV23) vaccine. One dose is recommended after age 87. Talk to your health care provider about which screenings and vaccines you need and how often you need them. This information is not intended to replace advice given to you by your health care provider. Make sure you discuss any questions you have with your health care provider. Document Released: 09/19/2015 Document Revised: 05/12/2016 Document Reviewed: 06/24/2015 Elsevier Interactive Patient Education  2017 [place] Prevention in the Home Falls can cause injuries. They can happen to people of all ages. There are many things you can do to make your home safe and to help prevent falls. What can I do on the outside of my home? Regularly fix the edges of walkways and driveways and fix any cracks. Remove anything that might make you trip as you walk through a door, such as a raised step or threshold. Trim any bushes or trees on the path to your home. Use bright outdoor lighting. Clear any walking paths of anything that might make someone trip, such as rocks or tools. Regularly check to see if handrails are loose or broken. Make sure that  both sides of any steps have handrails. Any raised decks and porches should have guardrails on the edges. Have any leaves, snow, or ice cleared regularly. Use sand or salt on walking paths during winter. Clean up any spills in your garage right away. This includes oil or grease spills. What can I do in the bathroom? Use night lights. Install grab bars by the toilet and in the tub and shower. Do not use towel bars as grab bars. Use non-skid mats or decals in the tub or shower. If you need to sit down in the shower, use a plastic, non-slip stool. Keep the floor dry. Clean up any water that spills on the floor as soon as it happens. Remove soap buildup in the tub or shower regularly. Attach bath mats securely with double-sided non-slip rug tape. Do not have throw rugs and other things on the floor that can make you trip. What can I do in the bedroom? Use night lights. Make sure that you have a light by your bed that is easy to reach. Do not use any sheets or blankets that are too big for your bed. They should not hang down onto the floor. Have a firm chair that has  side arms. You can use this for support while you get dressed. Do not have throw rugs and other things on the floor that can make you trip. What can I do in the kitchen? Clean up any spills right away. Avoid walking on wet floors. Keep items that you use a lot in easy-to-reach places. If you need to reach something above you, use a strong step stool that has a grab bar. Keep electrical cords out of the way. Do not use floor polish or wax that makes floors slippery. If you must use wax, use non-skid floor wax. Do not have throw rugs and other things on the floor that can make you trip. What can I do with my stairs? Do not leave any items on the stairs. Make sure that there are handrails on both sides of the stairs and use them. Fix handrails that are broken or loose. Make sure that handrails are as long as the stairways. Check  any carpeting to make sure that it is firmly attached to the stairs. Fix any carpet that is loose or worn. Avoid having throw rugs at the top or bottom of the stairs. If you do have throw rugs, attach them to the floor with carpet tape. Make sure that you have a light switch at the top of the stairs and the bottom of the stairs. If you do not have them, ask someone to add them for you. What else can I do to help prevent falls? Wear shoes that: Do not have high heels. Have rubber bottoms. Are comfortable and fit you well. Are closed at the toe. Do not wear sandals. If you use a stepladder: Make sure that it is fully opened. Do not climb a closed stepladder. Make sure that both sides of the stepladder are locked into place. Ask someone to hold it for you, if possible. Clearly mark and make sure that you can see: Any grab bars or handrails. First and last steps. Where the edge of each step is. Use tools that help you move around (mobility aids) if they are needed. These include: Canes. Walkers. Scooters. Crutches. Turn on the lights when you go into a dark area. Replace any light bulbs as soon as they burn out. Set up your furniture so you have a clear path. Avoid moving your furniture around. If any of your floors are uneven, fix them. If there are any pets around you, be aware of where they are. Review your medicines with your doctor. Some medicines can make you feel dizzy. This can increase your chance of falling. Ask your doctor what other things that you can do to help prevent falls. This information is not intended to replace advice given to you by your health care provider. Make sure you discuss any questions you have with your health care provider. Document Released: 06/19/2009 Document Revised: 01/29/2016 Document Reviewed: 09/27/2014 Elsevier Interactive Patient Education  2017 [employer].

## 2022-01-13 DIAGNOSIS — M1711 Unilateral primary osteoarthritis, right knee: Secondary | ICD-10-CM | POA: Diagnosis not present

## 2022-01-13 DIAGNOSIS — G894 Chronic pain syndrome: Secondary | ICD-10-CM | POA: Diagnosis not present

## 2022-01-13 DIAGNOSIS — M79644 Pain in right finger(s): Secondary | ICD-10-CM | POA: Diagnosis not present

## 2022-01-13 DIAGNOSIS — M47816 Spondylosis without myelopathy or radiculopathy, lumbar region: Secondary | ICD-10-CM | POA: Diagnosis not present

## 2022-01-13 DIAGNOSIS — Z79891 Long term (current) use of opiate analgesic: Secondary | ICD-10-CM | POA: Diagnosis not present

## 2022-01-29 ENCOUNTER — Ambulatory Visit: Payer: Medicare Other | Admitting: Family Medicine

## 2022-02-17 DIAGNOSIS — M47816 Spondylosis without myelopathy or radiculopathy, lumbar region: Secondary | ICD-10-CM | POA: Diagnosis not present

## 2022-02-17 DIAGNOSIS — M79644 Pain in right finger(s): Secondary | ICD-10-CM | POA: Diagnosis not present

## 2022-02-17 DIAGNOSIS — M1711 Unilateral primary osteoarthritis, right knee: Secondary | ICD-10-CM | POA: Diagnosis not present

## 2022-02-17 DIAGNOSIS — G894 Chronic pain syndrome: Secondary | ICD-10-CM | POA: Diagnosis not present

## 2022-02-17 DIAGNOSIS — Z79891 Long term (current) use of opiate analgesic: Secondary | ICD-10-CM | POA: Diagnosis not present

## 2022-03-18 DIAGNOSIS — G894 Chronic pain syndrome: Secondary | ICD-10-CM | POA: Diagnosis not present

## 2022-03-18 DIAGNOSIS — Z79891 Long term (current) use of opiate analgesic: Secondary | ICD-10-CM | POA: Diagnosis not present

## 2022-03-18 DIAGNOSIS — M47816 Spondylosis without myelopathy or radiculopathy, lumbar region: Secondary | ICD-10-CM | POA: Diagnosis not present

## 2022-03-30 ENCOUNTER — Ambulatory Visit (INDEPENDENT_AMBULATORY_CARE_PROVIDER_SITE_OTHER): Payer: Medicare HMO | Admitting: Family Medicine

## 2022-03-30 ENCOUNTER — Encounter: Payer: Self-pay | Admitting: Family Medicine

## 2022-03-30 VITALS — BP 167/105 | HR 75 | Ht 63.0 in | Wt 150.0 lb

## 2022-03-30 DIAGNOSIS — E7800 Pure hypercholesterolemia, unspecified: Secondary | ICD-10-CM | POA: Diagnosis not present

## 2022-03-30 DIAGNOSIS — M159 Polyosteoarthritis, unspecified: Secondary | ICD-10-CM

## 2022-03-30 DIAGNOSIS — R7303 Prediabetes: Secondary | ICD-10-CM

## 2022-03-30 DIAGNOSIS — I10 Essential (primary) hypertension: Secondary | ICD-10-CM | POA: Diagnosis not present

## 2022-03-30 DIAGNOSIS — E039 Hypothyroidism, unspecified: Secondary | ICD-10-CM | POA: Diagnosis not present

## 2022-03-30 NOTE — Patient Instructions (Addendum)
F/U mid September, re evaluate blood pressure, NEED to take medication EVERY DAY  Your three blood pressure meds are hydrochlorothiazide, isosorbide mononitrate and , amlodipine and you need to take them altogether every day at the same time in the morning at breakfast around 11 am  Nurse please check if [facility] does pill packing and if so we should try to get her meds pill packed  Thanks for choosing [facility], we consider it a privelige to serve you.  Please get fasting CBC, lipid, cmp and EGFR , tSH 5 days  before next visit.

## 2022-03-30 NOTE — Assessment & Plan Note (Signed)
Uncontrolled, non compliant with medication, incapable of being responsible for self administration of meds, will try to arrange pharmacy to pill pack. Needs nurse visit in 2 weeks to re evaluate blood prssure

## 2022-03-30 NOTE — Progress Notes (Signed)
   [name]     MRN: [id number]      DOB: May 06, 1934   HPI [name] is here for follow up and re-evaluation of chronic medical conditions, medication management and review of any available recent lab and radiology data.  Preventive health is updated, specifically  Cancer screening and Immunization.   Admits to not taking medications as regularly as she needs to, sometimes forgets to take them and feels this is the reason her BP is uncontrolled today Grandson no longer lives nearby so she is responsible Interested in pill packing if available to her Reports becoming more socially isolated, no real interst or desire to be around people much, feels she should "cry alone" thinking of her deceaased parents and sister, make her " feel alone' though she is te Mother of 3 adult children 2 of whom help and are involved with her , one has untreated mental illness and this remains a challenge  ROS Denies recent fever or chills. Denies sinus pressure, nasal congestion, ear pain or sore throat. Denies chest congestion, productive cough or wheezing. Denies chest pains, palpitations and leg swelling Denies abdominal pain, nausea, vomiting,diarrhea or constipation.   Denies dysuria, frequency, hesitancy or incontinence. Chronic  joint pain, swelling and limitation in mobility. Denies headaches, seizures, numbness, or tingling. Denies depression, anxiety or insomnia. Denies skin break down or rash.   PE  BP (!) 167/105 (BP Location: Right Arm, Cuff Size: Normal)   Pulse 75   Ht '5\' 3"'$  (1.6 m)   Wt 150 lb 0.6 oz (68.1 kg)   SpO2 98%   BMI 26.58 kg/m   Patient alert and oriented .  HEENT: No facial asymmetry, EOMI,     Neck supple .  Chest: Clear to auscultation bilaterally.  CVS: S1, S2 no murmurs, no S3.Regular rate.  ABD: Soft non tender.   Ext: No edema  MS: decreased  ROM spine, shoulders, hips and knees.  Skin: Intact, no ulcerations or rash noted.  Psych: Good eye  contact, normal affect. Memory intact not anxious or depressed appearing.  CNS: CN 2-12 intact, power,  normal throughout.no focal deficits noted.   Assessment & Plan  Essential hypertension Uncontrolled, non compliant with medication, incapable of being responsible for self administration of meds, will try to arrange pharmacy to pill pack. Needs nurse visit in 2 weeks to re evaluate blood prssure  Hyperlipidemia Hyperlipidemia:Low fat diet discussed and encouraged.   Lipid Panel  Lab Results  Component Value Date   CHOL 193 12/18/2021   HDL 55 12/18/2021   LDLCALC 122 (H) 12/18/2021   TRIG 87 12/18/2021   CHOLHDL 3.5 12/18/2021     Updated lab needed at/ before next visit.   Osteoarthritis Home safety and fall risk reduction discussed  Hypothyroid Updated lab needed at/ before next visit.

## 2022-03-30 NOTE — Assessment & Plan Note (Signed)
Updated lab needed at/ before next visit.   

## 2022-03-30 NOTE — Assessment & Plan Note (Signed)
Home safety and fall risk reduction discussed 

## 2022-03-30 NOTE — Assessment & Plan Note (Signed)
Hyperlipidemia:Low fat diet discussed and encouraged.   Lipid Panel  Lab Results  Component Value Date   CHOL 193 12/18/2021   HDL 55 12/18/2021   LDLCALC 122 (H) 12/18/2021   TRIG 87 12/18/2021   CHOLHDL 3.5 12/18/2021     Updated lab needed at/ before next visit.

## 2022-04-01 ENCOUNTER — Ambulatory Visit (INDEPENDENT_AMBULATORY_CARE_PROVIDER_SITE_OTHER): Payer: Medicare HMO

## 2022-04-01 VITALS — BP 99/64

## 2022-04-01 DIAGNOSIS — I10 Essential (primary) hypertension: Secondary | ICD-10-CM | POA: Diagnosis not present

## 2022-04-13 DIAGNOSIS — M47816 Spondylosis without myelopathy or radiculopathy, lumbar region: Secondary | ICD-10-CM | POA: Diagnosis not present

## 2022-04-13 DIAGNOSIS — G894 Chronic pain syndrome: Secondary | ICD-10-CM | POA: Diagnosis not present

## 2022-04-13 DIAGNOSIS — Z79891 Long term (current) use of opiate analgesic: Secondary | ICD-10-CM | POA: Diagnosis not present

## 2022-04-15 ENCOUNTER — Ambulatory Visit: Payer: Medicare HMO

## 2022-04-16 ENCOUNTER — Ambulatory Visit (INDEPENDENT_AMBULATORY_CARE_PROVIDER_SITE_OTHER): Payer: Medicare HMO

## 2022-04-16 VITALS — BP 134/84

## 2022-04-16 DIAGNOSIS — I10 Essential (primary) hypertension: Secondary | ICD-10-CM

## 2022-05-16 ENCOUNTER — Other Ambulatory Visit: Payer: Self-pay | Admitting: Family Medicine

## 2022-05-19 ENCOUNTER — Ambulatory Visit (INDEPENDENT_AMBULATORY_CARE_PROVIDER_SITE_OTHER): Payer: Medicare HMO | Admitting: Family Medicine

## 2022-05-19 ENCOUNTER — Encounter: Payer: Self-pay | Admitting: Family Medicine

## 2022-05-19 VITALS — BP 110/60 | HR 98 | Resp 12 | Ht 63.0 in | Wt 151.1 lb

## 2022-05-19 DIAGNOSIS — I10 Essential (primary) hypertension: Secondary | ICD-10-CM | POA: Diagnosis not present

## 2022-05-19 DIAGNOSIS — Z23 Encounter for immunization: Secondary | ICD-10-CM

## 2022-05-19 DIAGNOSIS — E039 Hypothyroidism, unspecified: Secondary | ICD-10-CM

## 2022-05-19 DIAGNOSIS — E7800 Pure hypercholesterolemia, unspecified: Secondary | ICD-10-CM | POA: Diagnosis not present

## 2022-05-19 DIAGNOSIS — M159 Polyosteoarthritis, unspecified: Secondary | ICD-10-CM | POA: Diagnosis not present

## 2022-05-19 NOTE — Patient Instructions (Addendum)
Annual exam end Novemebr, call if you need me sooner   Flu vaccine today  Fasting labs  already ordered to be drawn 5 days before November appt  Please get covid booster when available in next 1 to 2 weeks  Thanks for choosing [facility], we consider it a privelige to serve you.

## 2022-05-23 ENCOUNTER — Encounter: Payer: Self-pay | Admitting: Family Medicine

## 2022-05-23 NOTE — Assessment & Plan Note (Signed)
Controlled, no change in medication DASH diet and commitment to daily physical activity for a minimum of 30 minutes discussed and encouraged, as a part of hypertension management. The importance of attaining a healthy weight is also discussed.     05/19/2022    3:51 PM 05/19/2022    3:04 PM 04/16/2022    3:10 PM 04/01/2022    4:16 PM 03/30/2022    3:18 PM 03/30/2022    3:16 PM 12/18/2021   11:03 AM  BP/Weight  Systolic BP 308 657 846 99 962 952 841  Diastolic BP 60 60 84 64 324 104 70  Wt. (Lbs)  151.12    150.04 148  BMI  26.77 kg/m2    26.58 kg/m2 30.93 kg/m2

## 2022-05-23 NOTE — Assessment & Plan Note (Signed)
Fall risk reduction discussed

## 2022-05-23 NOTE — Assessment & Plan Note (Signed)
Hyperlipidemia:Low fat diet discussed and encouraged.   Lipid Panel  Lab Results  Component Value Date   CHOL 193 12/18/2021   HDL 55 12/18/2021   LDLCALC 122 (H) 12/18/2021   TRIG 87 12/18/2021   CHOLHDL 3.5 12/18/2021     Updated lab needed at/ before next visit. needs to reduce fat in diet

## 2022-05-23 NOTE — Progress Notes (Signed)
   [name]     MRN: [id number]      DOB: 02-08-1934   HPI [name] is here for follow up and re-evaluation of chronic medical conditions, medication management and review of any available recent lab and radiology data.  Preventive health is updated, specifically  Cancer screening and Immunization.   Questions or concerns regarding consultations or procedures which the PT has had in the interim are  addressed. The PT denies any adverse reactions to current medications since the last visit.  There are no new concerns.  There are no specific complaints   ROS Denies recent fever or chills. Denies sinus pressure, nasal congestion, ear pain or sore throat. Denies chest congestion, productive cough or wheezing. Denies chest pains, palpitations and leg swelling Denies abdominal pain, nausea, vomiting,diarrhea or constipation.   Denies dysuria, frequency, hesitancy or incontinence. Denies uncontrolled  joint pain, swelling and limitation in mobility. Denies headaches, seizures, numbness, or tingling. Denies depression, anxiety or insomnia. Denies skin break down or rash.   PE  BP 110/60   Pulse 98   Resp 12   Ht '5\' 3"'$  (1.6 m)   Wt 151 lb 1.9 oz (68.5 kg)   SpO2 95%   BMI 26.77 kg/m   Patient alert and oriented and in no cardiopulmonary distress.  HEENT: No facial asymmetry, EOMI,     Neck supple .  Chest: Clear to auscultation bilaterally.  CVS: S1, S2 no murmurs, no S3.Regular rate.  ABD: Soft non tender.   Ext: No edema  MS: Adequate though reduced  ROM spine, shoulders, hips and knees.  Skin: Intact, no ulcerations or rash noted.  Psych: Good eye contact, normal affect. Memory intact not anxious or depressed appearing.  CNS: CN 2-12 intact, power,  normal throughout.no focal deficits noted.   Assessment & Plan  Essential hypertension Controlled, no change in medication DASH diet and commitment to daily physical activity for a minimum of 30 minutes  discussed and encouraged, as a part of hypertension management. The importance of attaining a healthy weight is also discussed.     05/19/2022    3:51 PM 05/19/2022    3:04 PM 04/16/2022    3:10 PM 04/01/2022    4:16 PM 03/30/2022    3:18 PM 03/30/2022    3:16 PM 12/18/2021   11:03 AM  BP/Weight  Systolic BP 818 299 371 99 [phone]  Diastolic BP 60 60 84 64 017 104 70  Wt. (Lbs)  151.12    150.04 148  BMI  26.77 kg/m2    26.58 kg/m2 30.93 kg/m2       Hyperlipidemia Hyperlipidemia:Low fat diet discussed and encouraged.   Lipid Panel  Lab Results  Component Value Date   CHOL 193 12/18/2021   HDL 55 12/18/2021   LDLCALC 122 (H) 12/18/2021   TRIG 87 12/18/2021   CHOLHDL 3.5 12/18/2021     Updated lab needed at/ before next visit. needs to reduce fat in diet  Hypothyroid .Updated lab needed at/ before next visit. Controlled when last checked  Osteoarthritis Fall risk reduction discussed

## 2022-05-23 NOTE — Assessment & Plan Note (Signed)
Updated lab needed at/ before next visit. Controlled when last checked 

## 2022-06-21 DIAGNOSIS — H35351 Cystoid macular degeneration, right eye: Secondary | ICD-10-CM | POA: Diagnosis not present

## 2022-06-21 DIAGNOSIS — H25813 Combined forms of age-related cataract, bilateral: Secondary | ICD-10-CM | POA: Diagnosis not present

## 2022-06-22 DIAGNOSIS — G894 Chronic pain syndrome: Secondary | ICD-10-CM | POA: Diagnosis not present

## 2022-06-22 DIAGNOSIS — M47816 Spondylosis without myelopathy or radiculopathy, lumbar region: Secondary | ICD-10-CM | POA: Diagnosis not present

## 2022-06-22 DIAGNOSIS — M545 Low back pain, unspecified: Secondary | ICD-10-CM | POA: Diagnosis not present

## 2022-06-22 DIAGNOSIS — Z79891 Long term (current) use of opiate analgesic: Secondary | ICD-10-CM | POA: Diagnosis not present

## 2022-07-08 DIAGNOSIS — M47816 Spondylosis without myelopathy or radiculopathy, lumbar region: Secondary | ICD-10-CM | POA: Diagnosis not present

## 2022-07-13 ENCOUNTER — Other Ambulatory Visit: Payer: Self-pay

## 2022-07-13 ENCOUNTER — Telehealth: Payer: Self-pay | Admitting: Family Medicine

## 2022-07-13 MED ORDER — LEVOTHYROXINE SODIUM 25 MCG PO TABS: ORAL_TABLET | ORAL | 3 refills | Status: DC

## 2022-07-13 MED ORDER — ISOSORBIDE MONONITRATE ER 30 MG PO TB24: 30.0000 mg | ORAL_TABLET | Freq: Every day | ORAL | 3 refills | Status: DC

## 2022-07-13 MED ORDER — HYDROCHLOROTHIAZIDE 25 MG PO TABS: 25.0000 mg | ORAL_TABLET | Freq: Every day | ORAL | 5 refills | Status: DC

## 2022-07-13 NOTE — Telephone Encounter (Signed)
pLS CONTACT HER PHARMACY [facility] ON [address] AND CALL IN REFILLS THAT HSE MAY NEED. I ALSO LEFT AT YOUR STATION A RESPONSE TO [facility] THAT SHE DOES NOT NEED, WHERE THEY HAVE BEEN REQUESTING HER MEDICAL INFO, PLS FAX BACK THE PAPER I SENT AND LET ME GET THE PAPERWORK BACK PLEASE THANKS

## 2022-07-13 NOTE — Telephone Encounter (Signed)
Refilled sent.

## 2022-07-13 NOTE — Telephone Encounter (Signed)
Form faxed back.

## 2022-07-20 DIAGNOSIS — M47816 Spondylosis without myelopathy or radiculopathy, lumbar region: Secondary | ICD-10-CM | POA: Diagnosis not present

## 2022-07-20 DIAGNOSIS — G894 Chronic pain syndrome: Secondary | ICD-10-CM | POA: Diagnosis not present

## 2022-07-20 DIAGNOSIS — M545 Low back pain, unspecified: Secondary | ICD-10-CM | POA: Diagnosis not present

## 2022-07-20 DIAGNOSIS — Z79891 Long term (current) use of opiate analgesic: Secondary | ICD-10-CM | POA: Diagnosis not present

## 2022-07-27 ENCOUNTER — Encounter: Payer: Self-pay | Admitting: Family Medicine

## 2022-07-27 ENCOUNTER — Ambulatory Visit (INDEPENDENT_AMBULATORY_CARE_PROVIDER_SITE_OTHER): Payer: Medicare HMO | Admitting: Family Medicine

## 2022-07-27 VITALS — BP 119/83 | HR 69 | Ht 63.0 in | Wt 154.0 lb

## 2022-07-27 DIAGNOSIS — Z0001 Encounter for general adult medical examination with abnormal findings: Secondary | ICD-10-CM

## 2022-07-27 NOTE — Assessment & Plan Note (Signed)
Annual exam as documented. . Immunization and cancer screening needs are specifically addressed at this visit.  

## 2022-07-27 NOTE — Progress Notes (Signed)
    [name]     MRN: [id number]      DOB: [birth year]  HPI: Patient is in for annual physical exam. C/o chronic back pain which is managed by Pain [facility]ement, she is ambulatory and has had no falls for over 1 year Immunization is reviewed    PE: BP 119/83 (BP Location: Right Arm, Patient Position: Sitting, Cuff Size: Large)   Pulse 69   Ht '5\' 3"'$  (1.6 m)   Wt 154 lb 0.6 oz (69.9 kg)   SpO2 94%   BMI 27.29 kg/m   Pleasant  female, alert and oriented x 3, in no cardio-pulmonary distress. Afebrile. HEENT No facial trauma or asymetry. Sinuses non tender.  Extra occullar muscles intact.. External ears normal, . Neck: decreased ROM, no adenopathy,JVD or thyromegaly.No bruits.  Chest: Clear to ascultation bilaterally.No crackles or wheezes. Non tender to palpation   Cardiovascular system; Heart sounds normal,  S1 and  S2 ,no S3.  No murmur, or thrill. Apical beat not displaced Peripheral pulses normal.  Abdomen: Soft, non tender,       Musculoskeletal exam: Decreased  ROM of spine, hips , shoulders and knees. No deformity ,swelling or crepitus noted. No muscle wasting or atrophy.   Neurologic: Cranial nerves 2 to 12 intact. Power, tone ,sensation  normal throughout.  disturbance in gait. No tremor.  Skin: Intact, no ulceration, erythema , scaling or rash noted. Pigmentation normal throughout  Psych; Normal mood and affect. Judgement and concentration normal   Assessment & Plan:  Encounter for Medicare annual examination with abnormal findings Annual exam as documented. . Immunization and cancer screening needs are specifically addressed at this visit.

## 2022-07-27 NOTE — Patient Instructions (Addendum)
F/U in 4 months,call if  you need me sooner  No changes in medication, and please bring all of your medications to the next visit  Blood pressure is excellent today  Labs ordered in July to be drawn today  Continue to be safe , use the cane when you know you need it , because falling you absolutely want to avoid  Thanks for choosing [facility], we consider it a privelige to serve you.

## 2022-08-09 DIAGNOSIS — M1990 Unspecified osteoarthritis, unspecified site: Secondary | ICD-10-CM | POA: Diagnosis not present

## 2022-08-09 DIAGNOSIS — M8580 Other specified disorders of bone density and structure, unspecified site: Secondary | ICD-10-CM | POA: Diagnosis not present

## 2022-08-09 DIAGNOSIS — Z87891 Personal history of nicotine dependence: Secondary | ICD-10-CM | POA: Diagnosis not present

## 2022-08-09 DIAGNOSIS — Z8249 Family history of ischemic heart disease and other diseases of the circulatory system: Secondary | ICD-10-CM | POA: Diagnosis not present

## 2022-08-09 DIAGNOSIS — Z008 Encounter for other general examination: Secondary | ICD-10-CM | POA: Diagnosis not present

## 2022-08-09 DIAGNOSIS — G629 Polyneuropathy, unspecified: Secondary | ICD-10-CM | POA: Diagnosis not present

## 2022-08-09 DIAGNOSIS — G3184 Mild cognitive impairment, so stated: Secondary | ICD-10-CM | POA: Diagnosis not present

## 2022-08-09 DIAGNOSIS — I10 Essential (primary) hypertension: Secondary | ICD-10-CM | POA: Diagnosis not present

## 2022-08-09 DIAGNOSIS — E890 Postprocedural hypothyroidism: Secondary | ICD-10-CM | POA: Diagnosis not present

## 2022-08-09 DIAGNOSIS — E785 Hyperlipidemia, unspecified: Secondary | ICD-10-CM | POA: Diagnosis not present

## 2022-08-09 DIAGNOSIS — I25119 Atherosclerotic heart disease of native coronary artery with unspecified angina pectoris: Secondary | ICD-10-CM | POA: Diagnosis not present

## 2022-08-09 DIAGNOSIS — Z79891 Long term (current) use of opiate analgesic: Secondary | ICD-10-CM | POA: Diagnosis not present

## 2022-08-09 DIAGNOSIS — E669 Obesity, unspecified: Secondary | ICD-10-CM | POA: Diagnosis not present

## 2022-08-13 DIAGNOSIS — E559 Vitamin D deficiency, unspecified: Secondary | ICD-10-CM | POA: Diagnosis not present

## 2022-09-02 DIAGNOSIS — Z79891 Long term (current) use of opiate analgesic: Secondary | ICD-10-CM | POA: Diagnosis not present

## 2022-09-02 DIAGNOSIS — G894 Chronic pain syndrome: Secondary | ICD-10-CM | POA: Diagnosis not present

## 2022-09-02 DIAGNOSIS — M545 Low back pain, unspecified: Secondary | ICD-10-CM | POA: Diagnosis not present

## 2022-09-02 DIAGNOSIS — M47816 Spondylosis without myelopathy or radiculopathy, lumbar region: Secondary | ICD-10-CM | POA: Diagnosis not present

## 2022-09-02 DIAGNOSIS — M79644 Pain in right finger(s): Secondary | ICD-10-CM | POA: Diagnosis not present

## 2022-09-30 DIAGNOSIS — G894 Chronic pain syndrome: Secondary | ICD-10-CM | POA: Diagnosis not present

## 2022-09-30 DIAGNOSIS — Z79891 Long term (current) use of opiate analgesic: Secondary | ICD-10-CM | POA: Diagnosis not present

## 2022-09-30 DIAGNOSIS — M47816 Spondylosis without myelopathy or radiculopathy, lumbar region: Secondary | ICD-10-CM | POA: Diagnosis not present

## 2022-09-30 DIAGNOSIS — M545 Low back pain, unspecified: Secondary | ICD-10-CM | POA: Diagnosis not present

## 2022-10-01 ENCOUNTER — Other Ambulatory Visit: Payer: Self-pay | Admitting: Nurse Practitioner

## 2022-10-01 DIAGNOSIS — M47816 Spondylosis without myelopathy or radiculopathy, lumbar region: Secondary | ICD-10-CM

## 2022-10-08 ENCOUNTER — Other Ambulatory Visit: Payer: Self-pay | Admitting: Family Medicine

## 2022-10-31 ENCOUNTER — Ambulatory Visit
Admission: RE | Admit: 2022-10-31 | Discharge: 2022-10-31 | Disposition: A | Discharge status: Home / Self Care | Payer: Medicare HMO | Source: Ambulatory Visit | Attending: Nurse Practitioner | Admitting: Nurse Practitioner

## 2022-10-31 DIAGNOSIS — M5137 Other intervertebral disc degeneration, lumbosacral region: Secondary | ICD-10-CM | POA: Diagnosis not present

## 2022-10-31 DIAGNOSIS — M48061 Spinal stenosis, lumbar region without neurogenic claudication: Secondary | ICD-10-CM | POA: Diagnosis not present

## 2022-10-31 DIAGNOSIS — M545 Low back pain, unspecified: Secondary | ICD-10-CM | POA: Diagnosis not present

## 2022-10-31 DIAGNOSIS — M47816 Spondylosis without myelopathy or radiculopathy, lumbar region: Secondary | ICD-10-CM

## 2022-11-04 DIAGNOSIS — G894 Chronic pain syndrome: Secondary | ICD-10-CM | POA: Diagnosis not present

## 2022-11-04 DIAGNOSIS — Z79891 Long term (current) use of opiate analgesic: Secondary | ICD-10-CM | POA: Diagnosis not present

## 2022-11-04 DIAGNOSIS — M47816 Spondylosis without myelopathy or radiculopathy, lumbar region: Secondary | ICD-10-CM | POA: Diagnosis not present

## 2022-11-04 DIAGNOSIS — M545 Low back pain, unspecified: Secondary | ICD-10-CM | POA: Diagnosis not present

## 2022-11-05 ENCOUNTER — Other Ambulatory Visit: Payer: Self-pay

## 2022-11-05 ENCOUNTER — Telehealth: Payer: Self-pay | Admitting: Family Medicine

## 2022-11-05 MED ORDER — POTASSIUM GLUCONATE 595 (99 K) MG PO TABS: 595.0000 mg | ORAL_TABLET | Freq: Every evening | ORAL | 1 refills | Status: AC

## 2022-11-05 MED ORDER — LEVOTHYROXINE SODIUM 25 MCG PO TABS: ORAL_TABLET | ORAL | 3 refills | Status: DC

## 2022-11-05 MED ORDER — EZETIMIBE 10 MG PO TABS: ORAL_TABLET | ORAL | 1 refills | Status: DC

## 2022-11-05 MED ORDER — ISOSORBIDE MONONITRATE ER 30 MG PO TB24: 30.0000 mg | ORAL_TABLET | Freq: Every day | ORAL | 3 refills | Status: AC

## 2022-11-05 MED ORDER — HYDROCHLOROTHIAZIDE 25 MG PO TABS: 25.0000 mg | ORAL_TABLET | Freq: Every day | ORAL | 5 refills | Status: AC

## 2022-11-05 MED ORDER — AMLODIPINE BESYLATE 10 MG PO TABS: ORAL_TABLET | ORAL | 3 refills | Status: DC

## 2022-11-05 NOTE — Telephone Encounter (Signed)
Prescription Request  11/05/2022   LOV: 07/27/2022  What is the name of the medication or equipment?   ALL MEDS BY DR. Moshe Cipro   Have you contacted your pharmacy to request a refill? No   Which pharmacy would you like this sent to?  [facility] [phone] - [place], [place] [id number] [place] [address] Phone: [phone] Fax: [phone]    Patient notified that their request is being sent to the clinical staff for review and that they should receive a response within 2 business days.   Please advise at [place]. CAN YOU PLEASE REFILL ALL THESE MEDS BY DR. Moshe Cipro?

## 2022-11-05 NOTE — Telephone Encounter (Signed)
Refills on all meds sent

## 2022-11-08 ENCOUNTER — Telehealth: Payer: Self-pay | Admitting: Cardiovascular Disease

## 2022-11-08 NOTE — Telephone Encounter (Signed)
*  STAT* If patient is at the pharmacy, call can be transferred to refill team.   1. Which medications need to be refilled? (please list name of each medication and dose if known) nitroGLYCERIN (NITROLINGUAL) 0.4 MG/SPRAY spray   2. Which pharmacy/location (including street and city if local pharmacy) is medication to be sent to? [facility] [place], [address] AT [place]   3. Do they need a 30 day or 90 day supply? [place]

## 2022-11-09 MED ORDER — NITROGLYCERIN 0.4 MG/SPRAY TL SOLN: 1.0000 | 0 refills | Status: DC | PRN

## 2022-11-09 NOTE — Telephone Encounter (Signed)
Refills has been sent to the pharmacy. Please schedule office visit.

## 2022-11-25 ENCOUNTER — Ambulatory Visit: Payer: Medicare HMO | Admitting: Family Medicine

## 2022-12-07 DIAGNOSIS — G894 Chronic pain syndrome: Secondary | ICD-10-CM | POA: Diagnosis not present

## 2022-12-07 DIAGNOSIS — Z79891 Long term (current) use of opiate analgesic: Secondary | ICD-10-CM | POA: Diagnosis not present

## 2022-12-07 DIAGNOSIS — M545 Low back pain, unspecified: Secondary | ICD-10-CM | POA: Diagnosis not present

## 2022-12-07 DIAGNOSIS — M47816 Spondylosis without myelopathy or radiculopathy, lumbar region: Secondary | ICD-10-CM | POA: Diagnosis not present

## 2022-12-10 ENCOUNTER — Ambulatory Visit (INDEPENDENT_AMBULATORY_CARE_PROVIDER_SITE_OTHER): Payer: Medicare HMO | Admitting: Family Medicine

## 2022-12-10 ENCOUNTER — Encounter: Payer: Self-pay | Admitting: Family Medicine

## 2022-12-10 VITALS — BP 170/110 | HR 82 | Ht 63.0 in | Wt 144.0 lb

## 2022-12-10 DIAGNOSIS — M47816 Spondylosis without myelopathy or radiculopathy, lumbar region: Secondary | ICD-10-CM | POA: Diagnosis not present

## 2022-12-10 DIAGNOSIS — E559 Vitamin D deficiency, unspecified: Secondary | ICD-10-CM

## 2022-12-10 DIAGNOSIS — R7303 Prediabetes: Secondary | ICD-10-CM | POA: Diagnosis not present

## 2022-12-10 DIAGNOSIS — E785 Hyperlipidemia, unspecified: Secondary | ICD-10-CM

## 2022-12-10 DIAGNOSIS — F4321 Adjustment disorder with depressed mood: Secondary | ICD-10-CM | POA: Diagnosis not present

## 2022-12-10 DIAGNOSIS — E663 Overweight: Secondary | ICD-10-CM

## 2022-12-10 DIAGNOSIS — I10 Essential (primary) hypertension: Secondary | ICD-10-CM

## 2022-12-10 DIAGNOSIS — E039 Hypothyroidism, unspecified: Secondary | ICD-10-CM | POA: Diagnosis not present

## 2022-12-10 DIAGNOSIS — R69 Illness, unspecified: Secondary | ICD-10-CM | POA: Diagnosis not present

## 2022-12-10 NOTE — Progress Notes (Signed)
[name]     MRN: [id number]      DOB: [birth year]   HPI [name] is here for follow up and re-evaluation of chronic medical conditions, medication management and review of any available recent lab and radiology data.  Preventive health is updated, specifically  Cancer screening and Immunization.   Questions or concerns regarding consultations or procedures which the PT has had in the interim are  addressed. The PT denies any adverse reactions to current medications since the last visit.  Currently grieving loss of her " fiancee"who passed in [place] last month, states a lot was kept from her , she has now lost her " best friend" was able to attend the funeralnot the burial, his sister died the following day, she did not attend the funeral Reliving loss of both her parents, glad to be back home tho her daughter wants her with her Worried over Aunt whose health has deteriorated  Does not have medication and is unable to report what she is taking, blood pressure OUT of CONTROL at visit, denies an headache , blurred vision, no new weakness or umbness  ROS Denies recent fever or chills. Denies sinus pressure, nasal congestion, ear pain or sore throat. Denies chest congestion, productive cough or wheezing. Denies chest pains, palpitations and leg swelling Denies abdominal pain, nausea, vomiting,diarrhea or constipation.   Denies dysuria, frequency, hesitancy or incontinence. C/o uncontrolled joint pain, swelling and limitation in mobility.Tking med prescribed twice and not 4 times daily, states does not make her sleepy or impair judgment, no falls reported Denies headaches, seizures, numbness, or tingling. Denies skin break down or rash.   PE  BP (!) 170/110   Pulse 82   Ht 5\' 3"  (1.6 m)   Wt 144 lb (65.3 kg)   SpO2 96%   BMI 25.51 kg/m   Patient alert and oriented tearful HEENT: No facial asymmetry, EOMI,     Neck decreased ROM  Chest: Clear to auscultation  bilaterally.  CVS: S1, S2 no murmurs, no S3.Regular rate.  ABD: Soft non tender.   Ext: No edema  MS: Decreased  ROM spine, shoulders, hips and knees.  Skin: Intact, no ulcerations or rash noted.  Psych: Good eye contact, mildly  anxious not  depressed appearing.  CNS: CN 2-12 intact, power,  normal throughout.no focal deficits noted.   Assessment & Plan  Essential hypertension Uncontrolled and markedly elevated. Doubt compliant, impotance of same stressed , return in 1 to 2 weeks with meds, start taking meds as prescribed today DASH diet and commitment to daily physical activity for a minimum of 30 minutes discussed and encouraged, as a part of hypertension management. The importance of attaining a healthy weight is also discussed.     12/10/2022   10:13 AM 12/10/2022   10:11 AM 12/10/2022    9:48 AM 12/10/2022    9:47 AM 07/27/2022    3:13 PM 05/19/2022    3:51 PM 05/19/2022    3:04 PM  BP/Weight  Systolic BP 170 170 167 178 119 110 106  Diastolic BP 110 110 117 122 83 60 60  Wt. (Lbs)    144 154.04  151.12  BMI    25.51 kg/m2 27.29 kg/m2  26.77 kg/m2       Lumbar spondylosis Reports debilitating and worsening pain, encouraged  to inc med dose by 1 tab / day as currnetly not taking as prescribed by Pain mediine  Overweight (BMI 25.0-29.9) Unintentional weight loss, likely associated with recent  stress and grief, encouraged to ensure adequate food intake  Hypothyroid Updated lab needed at/ before next visit.   Prediabetes Patient educated about the importance of limiting  Carbohydrate intake , the need to commit to daily physical activity for a minimum of 30 minutes , and to commit weight loss. The fact that changes in all these areas will reduce or eliminate all together the development of diabetes is stressed.      Latest Ref Rng & Units 12/18/2021   11:46 AM 07/23/2021   12:14 PM 01/20/2021   11:37 AM 01/13/2021    2:36 PM 10/22/2020    1:32 PM  Diabetic Labs   Chol 100 - 199 mg/dL 579  728  206   015   HDL >39 mg/dL 55  56  49   48   Calc LDL 0 - 99 mg/dL 615  379  97   94   Triglycerides 0 - 149 mg/dL 87  86  432   761   Creatinine 0.57 - 1.00 mg/dL 4.70  9.29   5.74        12/10/2022   10:13 AM 12/10/2022   10:11 AM 12/10/2022    9:48 AM 12/10/2022    9:47 AM 07/27/2022    3:13 PM 05/19/2022    3:51 PM 05/19/2022    3:04 PM  BP/Weight  Systolic BP 170 170 167 178 119 110 106  Diastolic BP 110 110 117 122 83 60 60  Wt. (Lbs)    144 154.04  151.12  BMI    25.51 kg/m2 27.29 kg/m2  26.77 kg/m2       No data to display          Updated lab needed at/ before next visit.   Vitamin D deficiency Updated lab needed at/ before next visit.   Grief Pt verbalized her feelings for 10 mins, therapy offered . No interest currently " not ready" I encouraged her to work on remembering the good and experiencing more love than sorrow at the passing at advanced ages, both over 42 y/o both over 10 years ago

## 2022-12-10 NOTE — Assessment & Plan Note (Signed)
Pt verbalized her feelings for 10 mins, therapy offered . No interest currently " not ready" I encouraged her to work on remembering the good and experiencing more love than sorrow at the passing at advanced ages, both over 89 y/o both over 10 years ago

## 2022-12-10 NOTE — Assessment & Plan Note (Signed)
Reports debilitating and worsening pain, encouraged  to inc med dose by 1 tab / day as currnetly not taking as prescribed by Pain mediine

## 2022-12-10 NOTE — Patient Instructions (Addendum)
F/U in 1 week to re evaluate blood pressure in office , and review labs, NEED to bring all medication that you are taking, call if you need me sooner  Blood pressure VERY HIGH and you have lost 10 pounds since last visit, both are very concerning  Labs today,CBC, lipid, cmp and EGFr, TSH and vit D, fee T3 and free T 4  BP meds are  Amlodipine 10 mg Imdur 30 mg HCTZ 25 mg   Condolence re recent losses  Need to live with daughter sooner rather than later!  Increase pain medication to 3 times daily since you report  pain and no excess sleepiness  Careful not to fall! Thanks for choosing [facility], we consider it a privelige to serve you.

## 2022-12-10 NOTE — Assessment & Plan Note (Signed)
Unintentional weight loss, likely associated with recent stress and grief, encouraged to ensure adequate food intake

## 2022-12-10 NOTE — Assessment & Plan Note (Signed)
Updated lab needed at/ before next visit.   

## 2022-12-10 NOTE — Assessment & Plan Note (Signed)
Patient educated about the importance of limiting  Carbohydrate intake , the need to commit to daily physical activity for a minimum of 30 minutes , and to commit weight loss. The fact that changes in all these areas will reduce or eliminate all together the development of diabetes is stressed.      Latest Ref Rng & Units 12/18/2021   11:46 AM 07/23/2021   12:14 PM 01/20/2021   11:37 AM 01/13/2021    2:36 PM 10/22/2020    1:32 PM  Diabetic Labs  Chol 100 - 199 mg/dL 528  413  244   010   HDL >39 mg/dL 55  56  49   48   Calc LDL 0 - 99 mg/dL 272  536  97   94   Triglycerides 0 - 149 mg/dL 87  86  644   034   Creatinine 0.57 - 1.00 mg/dL 7.42  5.95   6.38        12/10/2022   10:13 AM 12/10/2022   10:11 AM 12/10/2022    9:48 AM 12/10/2022    9:47 AM 07/27/2022    3:13 PM 05/19/2022    3:51 PM 05/19/2022    3:04 PM  BP/Weight  Systolic BP 170 170 167 178 119 110 106  Diastolic BP 110 110 117 122 83 60 60  Wt. (Lbs)    144 154.04  151.12  BMI    25.51 kg/m2 27.29 kg/m2  26.77 kg/m2       No data to display          Updated lab needed at/ before next visit.

## 2022-12-10 NOTE — Assessment & Plan Note (Signed)
Uncontrolled and markedly elevated. Doubt compliant, impotance of same stressed , return in 1 to 2 weeks with meds, start taking meds as prescribed today DASH diet and commitment to daily physical activity for a minimum of 30 minutes discussed and encouraged, as a part of hypertension management. The importance of attaining a healthy weight is also discussed.     12/10/2022   10:13 AM 12/10/2022   10:11 AM 12/10/2022    9:48 AM 12/10/2022    9:47 AM 07/27/2022    3:13 PM 05/19/2022    3:51 PM 05/19/2022    3:04 PM  BP/Weight  Systolic BP 170 170 167 178 119 110 106  Diastolic BP 110 110 117 122 83 60 60  Wt. (Lbs)    144 154.04  151.12  BMI    25.51 kg/m2 27.29 kg/m2  26.77 kg/m2

## 2022-12-11 LAB — CMP14+EGFR
ALT: 11 IU/L (ref 0–32)
AST: 27 IU/L (ref 0–40)
Albumin/Globulin Ratio: 1.6 (ref 1.2–2.2)
Albumin: 4.2 g/dL (ref 3.7–4.7)
Alkaline Phosphatase: 82 IU/L (ref 44–121)
BUN/Creatinine Ratio: 11 — ABNORMAL LOW (ref 12–28)
BUN: 11 mg/dL (ref 8–27)
Bilirubin Total: 0.5 mg/dL (ref 0.0–1.2)
CO2: 24 mmol/L (ref 20–29)
Calcium: 9.5 mg/dL (ref 8.7–10.3)
Chloride: 100 mmol/L (ref 96–106)
Creatinine, Ser: 1.04 mg/dL — ABNORMAL HIGH (ref 0.57–1.00)
Globulin, Total: 2.6 g/dL (ref 1.5–4.5)
Glucose: 87 mg/dL (ref 70–99)
Potassium: 4.5 mmol/L (ref 3.5–5.2)
Sodium: 138 mmol/L (ref 134–144)
Total Protein: 6.8 g/dL (ref 6.0–8.5)
eGFR: 52 mL/min/{1.73_m2} — ABNORMAL LOW (ref >59)

## 2022-12-11 LAB — LIPID PANEL
Chol/HDL Ratio: 3.6 ratio (ref 0.0–4.4)
Cholesterol, Total: 194 mg/dL (ref 100–199)
HDL: 54 mg/dL (ref >39)
LDL Chol Calc (NIH): 121 mg/dL — ABNORMAL HIGH (ref 0–99)
Triglycerides: 106 mg/dL (ref 0–149)
VLDL Cholesterol Cal: 19 mg/dL (ref 5–40)

## 2022-12-11 LAB — CBC
Hematocrit: 41.7 % (ref 34.0–46.6)
Hemoglobin: 13.2 g/dL (ref 11.1–15.9)
MCH: 28.3 pg (ref 26.6–33.0)
MCHC: 31.7 g/dL (ref 31.5–35.7)
MCV: 89 fL (ref 79–97)
Platelets: 217 10*3/uL (ref 150–450)
RBC: 4.67 x10E6/uL (ref 3.77–5.28)
RDW: 13 % (ref 11.7–15.4)
WBC: 3.9 10*3/uL (ref 3.4–10.8)

## 2022-12-11 LAB — T3, FREE: T3, Free: 2.8 pg/mL (ref 2.0–4.4)

## 2022-12-11 LAB — VITAMIN D 25 HYDROXY (VIT D DEFICIENCY, FRACTURES): Vit D, 25-Hydroxy: 33.6 ng/mL (ref 30.0–100.0)

## 2022-12-11 LAB — TSH: TSH: 5.18 u[IU]/mL — ABNORMAL HIGH (ref 0.450–4.500)

## 2022-12-11 LAB — T4, FREE: Free T4: 1.12 ng/dL (ref 0.82–1.77)

## 2022-12-14 ENCOUNTER — Ambulatory Visit (INDEPENDENT_AMBULATORY_CARE_PROVIDER_SITE_OTHER): Payer: Medicare HMO | Admitting: Family Medicine

## 2022-12-14 ENCOUNTER — Encounter: Payer: Self-pay | Admitting: Family Medicine

## 2022-12-14 VITALS — BP 122/80 | HR 96 | Ht 63.0 in | Wt 144.0 lb

## 2022-12-14 DIAGNOSIS — E039 Hypothyroidism, unspecified: Secondary | ICD-10-CM | POA: Diagnosis not present

## 2022-12-14 DIAGNOSIS — F4321 Adjustment disorder with depressed mood: Secondary | ICD-10-CM

## 2022-12-14 DIAGNOSIS — E7800 Pure hypercholesterolemia, unspecified: Secondary | ICD-10-CM | POA: Diagnosis not present

## 2022-12-14 DIAGNOSIS — G894 Chronic pain syndrome: Secondary | ICD-10-CM | POA: Diagnosis not present

## 2022-12-14 DIAGNOSIS — R69 Illness, unspecified: Secondary | ICD-10-CM | POA: Diagnosis not present

## 2022-12-14 DIAGNOSIS — I10 Essential (primary) hypertension: Secondary | ICD-10-CM | POA: Diagnosis not present

## 2022-12-14 NOTE — Progress Notes (Signed)
   [name]     MRN: [id number]      DOB: 1933-10-15   HPI [name] is here for follow up and re-evaluation of uncontrolled hypertension ,and  she does have her medications at visit, also [name] is with her [name] who accompanies her  is Concerned about unsteady gait, does not use cane reguarly, concerned that she has dementia, not sure of stage, does not feel it safe for her to live alone, is moving with his wife next door to her again, she feels she can be on her own Concerned that she is not eating as regularly or as much as she should ROS Denies recent fever or chills. Denies sinus pressure, nasal congestion, ear pain or sore throat. Denies chest congestion, productive cough or wheezing. Denies chest pains, palpitations and leg swelling Denies abdominal pain, nausea, vomiting,diarrhea or constipation.   Denies dysuria, frequency, hesitancy or incontinence. Chronic debilitating pain in spine and legs, but afraid of and not taking medication as prescribed C/o grief and depression due to recent loss, and is reliving the fact that she has lost both parents Denies skin break down or rash.   PE  BP 122/80 (BP Location: Left Arm, Patient Position: Sitting, Cuff Size: Normal)   Pulse 96   Ht  (1.6 m)   Wt 144 lb (65.3 kg)   SpO2 95%   BMI 25.51 kg/m   Patient alert and oriented and in no cardiopulmonary distress.  HEENT: No facial asymmetry, EOMI,     Neck decreased ROM Chest: Clear to auscultation bilaterally.  CVS: S1, S2 no murmurs, no S3.Regular rate.  ABD: Soft non tender.   Ext: No edema  MS: markedly decreased  ROM spine, shoulders, hips and knees.  Skin: Intact, no ulcerations or rash noted.  Psych: Good eye contact, tearful at times CNS: CN 2-12 intact, no focal deficits noted.   Assessment & Plan  Essential hypertension Controlled, no change in medication   Hypothyroid No change in med dose , will review in 6   months   Hyperlipidemia Hyperlipidemia:Low fat diet discussed and encouraged.   Lipid Panel  Lab Results  Component Value Date   CHOL 194 12/10/2022   HDL 54 12/10/2022   LDLCALC 121 (H) 12/10/2022   TRIG 106 12/10/2022   CHOLHDL 3.6 12/10/2022     Needs to reduce  fat intake to lower LDL, no med change  Grief Ongoing appropriate grief, allowed to ventilate  Chronic pain syndrome Uncontrolled pain ans on compliant with treatment plan, encouraged to increase number of tabs she takes to improve pain control

## 2022-12-14 NOTE — Patient Instructions (Addendum)
F/U in 3 months, call if you need me sooner, MMSE next visit  Please pack your meds for 2 weeks, let your Grandson and his wife help you, they love you and want to support you  Careful not to fall Thanks for choosing [facility], we consider it a privelige to serve you.

## 2022-12-19 NOTE — Assessment & Plan Note (Signed)
Hyperlipidemia:Low fat diet discussed and encouraged.   Lipid Panel  Lab Results  Component Value Date   CHOL 194 12/10/2022   HDL 54 12/10/2022   LDLCALC 121 (H) 12/10/2022   TRIG 106 12/10/2022   CHOLHDL 3.6 12/10/2022     Needs to reduce  fat intake to lower LDL, no med change

## 2022-12-19 NOTE — Assessment & Plan Note (Signed)
Uncontrolled pain ans on compliant with treatment plan, encouraged to increase number of tabs she takes to improve pain control

## 2022-12-19 NOTE — Assessment & Plan Note (Signed)
No change in med dose , will review in 6  months

## 2022-12-19 NOTE — Assessment & Plan Note (Signed)
Controlled, no change in medication  

## 2022-12-19 NOTE — Assessment & Plan Note (Signed)
Ongoing appropriate grief, allowed to ventilate

## 2023-01-04 DIAGNOSIS — M47816 Spondylosis without myelopathy or radiculopathy, lumbar region: Secondary | ICD-10-CM | POA: Diagnosis not present

## 2023-01-04 DIAGNOSIS — G894 Chronic pain syndrome: Secondary | ICD-10-CM | POA: Diagnosis not present

## 2023-01-04 DIAGNOSIS — Z79891 Long term (current) use of opiate analgesic: Secondary | ICD-10-CM | POA: Diagnosis not present

## 2023-01-04 DIAGNOSIS — M545 Low back pain, unspecified: Secondary | ICD-10-CM | POA: Diagnosis not present

## 2023-01-07 ENCOUNTER — Ambulatory Visit (INDEPENDENT_AMBULATORY_CARE_PROVIDER_SITE_OTHER): Payer: Medicare HMO

## 2023-01-07 DIAGNOSIS — Z0000 Encounter for general adult medical examination without abnormal findings: Secondary | ICD-10-CM

## 2023-01-07 NOTE — Progress Notes (Signed)
Subjective:   Kizmet Endlich is a 89 y.o. female who presents for Medicare Annual (Subsequent) preventive examination.  I connected with  Severina Pesnell on 01/07/23 by a audio enabled telemedicine application and verified that I am speaking with the correct person using two identifiers.  Patient Location: Home  Provider Location: Office/Clinic  I discussed the limitations of evaluation and management by telemedicine. The patient expressed understanding and agreed to proceed.   Review of Systems     Ms. Hibbert , Thank you for taking time to come for your Medicare Wellness Visit. I appreciate your ongoing commitment to your health goals. Please review the following plan we discussed and let me know if I can assist you in the future.   These are the goals we discussed:  Goals      DIET - INCREASE WATER INTAKE     Drinks several bottles of water everyday     Exercise 3x per week (30 min per time)     LIFESTYLE - DECREASE FALLS RISK     Patient Stated     Stay healthy     patient stated     Continue living on by herself.     Prevent falls     Uses cane when ambulating since she is alone a lot of the time at home         This is a list of the screening recommended for you and due dates:  Health Maintenance  Topic Date Due   Zoster (Shingles) Vaccine (1 of 2) Never done   COVID-19 Vaccine (5 - 2023-24 season) 05/07/2022   Flu Shot  04/07/2023   Medicare Annual Wellness Visit  01/07/2024   DTaP/Tdap/Td vaccine (4 - Td or Tdap) 05/29/2030   Pneumonia Vaccine  Completed   DEXA scan (bone density measurement)  Completed   HPV Vaccine  Aged Out          Objective:    There were no vitals filed for this visit. There is no height or weight on file to calculate BMI.     01/04/2022   11:39 AM 12/31/2020   11:11 AM 05/29/2020    8:04 PM 02/02/2019   11:23 PM 02/02/2019    7:44 PM 01/17/2019   10:30 AM 12/22/2018    9:04 AM  Advanced Directives  Does Patient Have  a Medical Advance Directive? Yes Yes No No No No Yes  Type of Advance Directive Living will Healthcare Power of [place];Living will       Does patient want to make changes to medical advance directive? Yes (Inpatient - patient defers changing a medical advance directive and declines information at this time) No - Patient declined       Copy of Healthcare Power of Attorney in Chart?  No - copy requested       Would patient like information on creating a medical advance directive?    No - Patient declined No - Patient declined      Current Medications (verified) Outpatient Encounter Medications as of 01/07/2023  Medication Sig   amLODipine (NORVASC) 10 MG tablet TAKE 1 TABLET(10 MG) BY MOUTH DAILY   Ascorbic Acid (VITAMIN C) 500 MG tablet Take 500 mg by mouth daily.   aspirin EC 81 MG tablet Take 81 mg by mouth daily.   calcium carbonate (OS-CAL - DOSED IN MG OF ELEMENTAL CALCIUM) 1250 MG tablet Take 1 tablet by mouth daily.   Cyanocobalamin (B-12) 1000 MCG CAPS Take 1 capsule by  mouth daily.   diclofenac sodium (VOLTAREN) 1 % GEL Apply 2 g topically 2 (two) times daily as needed (for back pain).   EPINEPHrine 0.3 mg/0.3 mL IJ SOAJ injection INJECT INTO THE MUSCLE AS NEEDED   ezetimibe (ZETIA) 10 MG tablet TAKE 1 TABLET(10 MG) BY MOUTH DAILY   fluticasone (FLONASE) 50 MCG/ACT nasal spray SHAKE LIQUID AND USE 1 SPRAY IN EACH NOSTRIL DAILY (Patient not taking: Reported on 12/14/2022)   hydrochlorothiazide (HYDRODIURIL) 25 MG tablet Take 1 tablet (25 mg total) by mouth daily.   HYDROcodone-acetaminophen (NORCO) 10-325 MG tablet Take 1 tablet by mouth every 6 (six) hours as needed for moderate pain.   isosorbide mononitrate (IMDUR) 30 MG 24 hr tablet Take 1 tablet (30 mg total) by mouth daily.   levothyroxine (SYNTHROID) 25 MCG tablet TAKE 1 TABLET BY MOUTH ON MONDAY THROUGH THURSDAY, AN ONE AND A HALF TABLETS ON FRIDAY SATURDAY, AND SUNDAY   Multiple Vitamins-Minerals (CENTRUM SILVER 50+WOMEN) TABS  Take 1 tablet by mouth.   nitroGLYCERIN (NITROLINGUAL) 0.4 MG/SPRAY spray Place 1 spray under the tongue every 5 (five) minutes x 3 doses as needed for chest pain. NEED OV. (Patient not taking: Reported on 12/14/2022)   potassium gluconate 595 (99 K) MG TABS tablet Take 1 tablet (595 mg total) by mouth every evening. (Patient not taking: Reported on 12/14/2022)   No facility-administered encounter medications on file as of 01/07/2023.    Allergies (verified) Ace inhibitors, Beef-derived products, Bupropion hcl, Codeine, Penicillins, Angiotensin receptor blockers, Bee venom, Lambs quarters, and Pork-derived products   History: Past Medical History:  Diagnosis Date   Anemia    Blood clot associated with vein wall inflammation    hx of in right leg   CAD (coronary artery disease)    Carpal tunnel syndrome on left    Left Hand pain with numbness, new onset    Chronic back pain    Chronic back pain    buldging disc   Constipation    Diarrhea    Difficult intubation    small airway   DJD (degenerative joint disease)    Severe   Gastric ulcer    hx of    GERD (gastroesophageal reflux disease)    takes Omeprazole daily   Headache(784.0)    occasionally   Hyperlipidemia    takes Lipitor nightly   Hypertension    takes Amlodipine and Metoprolol daily   Nocturia    Obesity    Pneumonia    hx of-in 2011   Shortness of breath    with exertion   Sleep apnea    sleep study done 69yrs ago   Thyroid mass    right   Past Surgical History:  Procedure Laterality Date   ABDOMINAL HYSTERECTOMY     APPENDECTOMY  1987   Arthoscopy left knee  1992   Arthroscopy left shoulder  1999   Back  Surgery  lumbar  1989 /1999   x 2   BACK SURGERY     CARDIAC CATHETERIZATION  05/2010   CARDIAC CATHETERIZATION N/A 09/30/2015   Procedure: Left Heart Cath and Coronary Angiography;  Surgeon: Lennette Bihari, MD;  Location: [facility];  Service: Cardiovascular;  Laterality: N/A;   Carpal tunnel  release right     x 2   CHOLECYSTECTOMY     COLONOSCOPY     ESOPHAGOGASTRODUODENOSCOPY     LUMBAR FUSION  March 19, 2014   NM MYOCAR PERF WALL MOTION  11/12/2008  no ischemia   PARTIAL HYSTERECTOMY  1972   ROTATOR CUFF REPAIR     right   SPINE SURGERY     THYROIDECTOMY  11/17/2011   Procedure: THYROIDECTOMY;  Surgeon: Darletta Moll, MD;  Location: [facility] OR;  Service: ENT;  Laterality: Right;  WITH FROZEN SECTION   TONSILLECTOMY     Family History  Problem Relation Age of Onset   Anuerysm Mother        died at 26   Stroke Father        42 at death    Anesthesia problems Neg Hx    Hypotension Neg Hx    Malignant hyperthermia Neg Hx    Pseudochol deficiency Neg Hx    Allergic rhinitis Neg Hx    Angioedema Neg Hx    Asthma Neg Hx    Atopy Neg Hx    Eczema Neg Hx    Immunodeficiency Neg Hx    Urticaria Neg Hx    Social History   Socioeconomic History   Marital status: Widowed    Spouse name: Not on file   Number of children: 4   Years of education: Not on file   Highest education level: Not on file  Occupational History    Employer: RETIRED  Tobacco Use   Smoking status: Former    Types: Cigarettes    Quit date: 09/06/1984    Years since quitting: 38.3   Smokeless tobacco: Never  Vaping Use   Vaping Use: Never used  Substance and Sexual Activity   Alcohol use: No    Comment: Casual   Drug use: No   Sexual activity: Never    Birth control/protection: Surgical  Other Topics Concern   Not on file  Social History Narrative   Not on file   Social Determinants of Health   Financial Resource Strain: Low Risk  (01/04/2022)   Overall Financial Resource Strain (CARDIA)    Difficulty of Paying Living Expenses: Not hard at all  Food Insecurity: No Food Insecurity (01/04/2022)   Hunger Vital Sign    Worried About Running Out of Food in the Last Year: Never true    Ran Out of Food in the Last Year: Never true  Transportation Needs: No Transportation Needs (01/04/2022)   PRAPARE -  Administrator, Civil Service (Medical): No    Lack of Transportation (Non-Medical): No  Physical Activity: Insufficiently Active (01/04/2022)   Exercise Vital Sign    Days of Exercise per Week: 7 days    Minutes of Exercise per Session: 20 min  Stress: No Stress Concern Present (01/04/2022)   [employer] of Occupational Health - Occupational Stress Questionnaire    Feeling of Stress : Not at all  Social Connections: Moderately Isolated (01/04/2022)   Social Connection and Isolation Panel [NHANES]    Frequency of Communication with Friends and Family: More than three times a week    Frequency of Social Gatherings with Friends and Family: More than three times a week    Attends Religious Services: 1 to 4 times per year    Active Member of [employer] or Organizations: No    Attends Banker Meetings: Never    Marital Status: Widowed    Tobacco Counseling Counseling given: Not Answered   Clinical Intake:                 Diabetic? No         Activities of Daily Living  No data to display           Patient Care Team: [name], MD as PCP - General (Family Medicine) Croitoru, Rachelle Hora, MD as PCP - Cardiology (Cardiology) Newman Pies, MD as Attending Physician (Otolaryngology) Corbin Ade, MD as Attending Physician (Gastroenterology) Ranee Gosselin, MD as Consulting Physician (Orthopedic Surgery) Croitoru, Rachelle Hora, MD as Consulting Physician (Cardiology)  Indicate any recent Medical Services you may have received from other than [facility] providers in the past year (date may be approximate).     Assessment:   This is a routine wellness examination for Khaleesi.  Hearing/Vision screen No results found.  Dietary issues and exercise activities discussed:     Goals Addressed   None   Depression Screen    12/14/2022    4:30 PM 12/10/2022    9:48 AM 07/27/2022    3:16 PM 05/19/2022    3:06 PM 03/30/2022    3:18 PM 01/04/2022   11:39  AM 01/04/2022   11:37 AM  PHQ 2/9 Scores  PHQ - 2 Score 2 2 0 0 0 0 0  PHQ- 9 Score 5 8         Fall Risk    12/14/2022    4:30 PM 12/10/2022    9:48 AM 07/27/2022    3:15 PM 05/19/2022    3:06 PM 03/30/2022    3:18 PM  Fall Risk   Falls in the past year? 0 0 0 0 0  Number falls in past yr: 0 0 0  0  Injury with Fall? 0 0 0  0  Risk for fall due to : No Fall Risks No Fall Risks No Fall Risks  No Fall Risks  Follow up Falls evaluation completed Falls evaluation completed Falls evaluation completed  Falls evaluation completed    FALL RISK PREVENTION PERTAINING TO THE HOME:  Any stairs in or around the home? Yes  If so, are there any without handrails? No  Home free of loose throw rugs in walkways, pet beds, electrical cords, etc? Yes  Adequate lighting in your home to reduce risk of falls? Yes   ASSISTIVE DEVICES UTILIZED TO PREVENT FALLS:  Life alert? Yes  Use of a cane, walker or w/c? Yes  Grab bars in the bathroom? Yes  Shower chair or bench in shower? Yes  Elevated toilet seat or a handicapped toilet? Yes     Cognitive Function:    01/04/2022   11:40 AM 09/26/2017    2:39 PM 01/27/2017    3:38 PM  MMSE - Mini Mental State Exam  Not completed: Unable to complete    Orientation to time  5 5  Orientation to Place  5 5  Registration  3 3  Attention/ Calculation  5 5  Recall  0 3  Language- name 2 objects  2   Language- repeat  1   Language- follow 3 step command  3   Language- read & follow direction  1   Write a sentence  0   Copy design  1   Total score  26         01/04/2022   11:40 AM 12/24/2019    1:29 PM 12/22/2018    8:58 AM 12/22/2018    8:57 AM 12/19/2017    3:23 PM  6CIT Screen  What Year? 0 points 0 points 0 points  0 points  What month? 0 points 0 points 0 points  0 points  What time? 0 points 0  points 0 points 0 points 3 points  Count back from 20 0 points 0 points 0 points  0 points  Months in reverse 0 points 0 points 0 points  0 points  Repeat  phrase 4 points 4 points 0 points  8 points  Total Score 4 points 4 points 0 points  11 points    Immunizations Immunization History  Administered Date(s) Administered   Fluad Quad(high Dose 65+) 05/28/2019, 06/03/2020, 07/23/2021, 05/19/2022   H1N1 10/01/2008   Influenza Split 05/18/2012   Influenza Whole 05/26/2007, 06/12/2009, 05/26/2011   Influenza,inj,Quad PF,6+ Mos 06/22/2013, 06/25/2014, 08/06/2015, 09/28/2016, 04/27/2017, 05/02/2018   PFIZER Comirnaty(Gray Top)Covid-19 Tri-Sucrose Vaccine 04/03/2021   PFIZER(Purple Top)SARS-COV-2 Vaccination 10/15/2019, 11/09/2019, 08/05/2020   Pneumococcal Conjugate-13 03/17/2015   Pneumococcal Polysaccharide-23 02/11/2004   Td 02/11/2004   Tdap 07/14/2013, 05/29/2020   Zoster, Live 12/26/2006    TDAP status: Up to date  Flu Vaccine status: Up to date  Pneumococcal vaccine status: Up to date  Covid-19 vaccine status: Completed vaccines  Qualifies for Shingles Vaccine? Yes   Zostavax completed Yes   Shingrix Completed?: Yes  Screening Tests Health Maintenance  Topic Date Due   Zoster Vaccines- Shingrix (1 of 2) Never done   COVID-19 Vaccine (5 - 2023-24 season) 05/07/2022   INFLUENZA VACCINE  04/07/2023   Medicare Annual Wellness (AWV)  01/07/2024   DTaP/Tdap/Td (4 - Td or Tdap) 05/29/2030   Pneumonia Vaccine 80+ Years old  Completed   DEXA SCAN  Completed   HPV VACCINES  Aged Out    Health Maintenance  Health Maintenance Due  Topic Date Due   Zoster Vaccines- Shingrix (1 of 2) Never done   COVID-19 Vaccine (5 - 2023-24 season) 05/07/2022    Colorectal cancer screening: No longer required.   Mammogram status: No longer required due to age.  Bone Density status: Completed 11/20/2020. Results reflect: Bone density results: OSTEOPENIA. Repeat every 2 years.  Lung Cancer Screening: (Low Dose CT Chest recommended if Age 47-80 years, 30 pack-year currently smoking OR have quit w/in 15years.) does not qualify.   Lung  Cancer Screening Referral: No  Additional Screening:  Hepatitis C Screening: does qualify; Completed   Vision Screening: Recommended annual ophthalmology exams for early detection of glaucoma and other disorders of the eye. Is the patient up to date with their annual eye exam?  Yes  Who is the provider or what is the name of the office in which the patient attends annual eye exams? Dr Dione Booze  Dental Screening: Recommended annual dental exams for proper oral hygiene  Community Resource Referral / Chronic Care Management: CRR required this visit?  No   CCM required this visit?  No      Plan:     I have personally reviewed and noted the following in the patient's chart:   Medical and social history Use of alcohol, tobacco or illicit drugs  Current medications and supplements including opioid prescriptions. Patient is currently taking opioid prescriptions. Information provided to patient regarding non-opioid alternatives. Patient advised to discuss non-opioid treatment plan with their provider. Functional ability and status Nutritional status Physical activity Advanced directives List of other physicians Hospitalizations, surgeries, and ER visits in previous 12 months Vitals Screenings to include cognitive, depression, and falls Referrals and appointments  In addition, I have reviewed and discussed with patient certain preventive protocols, quality metrics, and best practice recommendations. A written personalized care plan for preventive services as well as general preventive health recommendations were provided to patient.  Telford Nab, CMA   01/07/2023

## 2023-01-07 NOTE — Patient Instructions (Signed)

## 2023-01-29 ENCOUNTER — Other Ambulatory Visit: Payer: Self-pay | Admitting: Family Medicine

## 2023-02-01 DIAGNOSIS — G894 Chronic pain syndrome: Secondary | ICD-10-CM | POA: Diagnosis not present

## 2023-02-01 DIAGNOSIS — M47816 Spondylosis without myelopathy or radiculopathy, lumbar region: Secondary | ICD-10-CM | POA: Diagnosis not present

## 2023-02-01 DIAGNOSIS — Z79891 Long term (current) use of opiate analgesic: Secondary | ICD-10-CM | POA: Diagnosis not present

## 2023-02-01 DIAGNOSIS — M545 Low back pain, unspecified: Secondary | ICD-10-CM | POA: Diagnosis not present

## 2023-02-07 DIAGNOSIS — M47816 Spondylosis without myelopathy or radiculopathy, lumbar region: Secondary | ICD-10-CM | POA: Diagnosis not present

## 2023-02-17 DIAGNOSIS — E785 Hyperlipidemia, unspecified: Secondary | ICD-10-CM | POA: Diagnosis not present

## 2023-02-17 DIAGNOSIS — M545 Low back pain, unspecified: Secondary | ICD-10-CM | POA: Diagnosis not present

## 2023-02-17 DIAGNOSIS — E039 Hypothyroidism, unspecified: Secondary | ICD-10-CM | POA: Diagnosis not present

## 2023-02-17 DIAGNOSIS — Z79899 Other long term (current) drug therapy: Secondary | ICD-10-CM | POA: Diagnosis not present

## 2023-02-17 DIAGNOSIS — G3184 Mild cognitive impairment, so stated: Secondary | ICD-10-CM | POA: Diagnosis not present

## 2023-02-17 DIAGNOSIS — E876 Hypokalemia: Secondary | ICD-10-CM | POA: Diagnosis not present

## 2023-02-17 DIAGNOSIS — Z87891 Personal history of nicotine dependence: Secondary | ICD-10-CM | POA: Diagnosis not present

## 2023-02-17 DIAGNOSIS — I25119 Atherosclerotic heart disease of native coronary artery with unspecified angina pectoris: Secondary | ICD-10-CM | POA: Diagnosis not present

## 2023-02-17 DIAGNOSIS — I252 Old myocardial infarction: Secondary | ICD-10-CM | POA: Diagnosis not present

## 2023-02-17 DIAGNOSIS — M1990 Unspecified osteoarthritis, unspecified site: Secondary | ICD-10-CM | POA: Diagnosis not present

## 2023-02-17 DIAGNOSIS — I10 Essential (primary) hypertension: Secondary | ICD-10-CM | POA: Diagnosis not present

## 2023-02-17 DIAGNOSIS — F1110 Opioid abuse, uncomplicated: Secondary | ICD-10-CM | POA: Diagnosis not present

## 2023-03-01 DIAGNOSIS — M545 Low back pain, unspecified: Secondary | ICD-10-CM | POA: Diagnosis not present

## 2023-03-01 DIAGNOSIS — G894 Chronic pain syndrome: Secondary | ICD-10-CM | POA: Diagnosis not present

## 2023-03-01 DIAGNOSIS — Z79891 Long term (current) use of opiate analgesic: Secondary | ICD-10-CM | POA: Diagnosis not present

## 2023-03-01 DIAGNOSIS — M47816 Spondylosis without myelopathy or radiculopathy, lumbar region: Secondary | ICD-10-CM | POA: Diagnosis not present

## 2023-03-15 ENCOUNTER — Ambulatory Visit (INDEPENDENT_AMBULATORY_CARE_PROVIDER_SITE_OTHER): Payer: Medicare HMO | Admitting: Family Medicine

## 2023-03-15 ENCOUNTER — Encounter: Payer: Self-pay | Admitting: Family Medicine

## 2023-03-15 VITALS — BP 129/80 | HR 81 | Ht 60.0 in | Wt 139.0 lb

## 2023-03-15 DIAGNOSIS — E039 Hypothyroidism, unspecified: Secondary | ICD-10-CM

## 2023-03-15 DIAGNOSIS — R6889 Other general symptoms and signs: Secondary | ICD-10-CM

## 2023-03-15 DIAGNOSIS — E7800 Pure hypercholesterolemia, unspecified: Secondary | ICD-10-CM | POA: Diagnosis not present

## 2023-03-15 DIAGNOSIS — I10 Essential (primary) hypertension: Secondary | ICD-10-CM | POA: Diagnosis not present

## 2023-03-15 DIAGNOSIS — M47816 Spondylosis without myelopathy or radiculopathy, lumbar region: Secondary | ICD-10-CM

## 2023-03-15 DIAGNOSIS — F4321 Adjustment disorder with depressed mood: Secondary | ICD-10-CM | POA: Diagnosis not present

## 2023-03-15 NOTE — Patient Instructions (Addendum)
F/U in 8 weeks, call if you need me sooner  MMSE today by nurse, if abnormal , 25 or less you will be started on medication as we discussed  Please consider talking to therapist, this wil help ou  Blood pressure is excellent  Thanks for choosing [facility], we consider it a privelige to serve you.

## 2023-03-16 ENCOUNTER — Encounter: Payer: Self-pay | Admitting: Family Medicine

## 2023-03-16 ENCOUNTER — Telehealth: Payer: Self-pay | Admitting: Family Medicine

## 2023-03-16 DIAGNOSIS — R6889 Other general symptoms and signs: Secondary | ICD-10-CM | POA: Insufficient documentation

## 2023-03-16 NOTE — Assessment & Plan Note (Signed)
Controlled, no change in medication  

## 2023-03-16 NOTE — Assessment & Plan Note (Signed)
Episodes of forgetfulness noted to be increased in frequency in past 4 to 8 weeks , family concerned and is wanting to request in home assistance Pt did approx 50 % of MSE , score nearly 100 % on thos questions then stopped the test , stating hse had had enough [facility] consult will be beneficial to work with pt and her family, hopefully she will agree

## 2023-03-16 NOTE — Assessment & Plan Note (Signed)
Chronic pain management through pain clinic, at increased fall risk and has limitations in ability to do housekeeping due to severe arthritis, would benefit from and in home assistance on some basis

## 2023-03-16 NOTE — Assessment & Plan Note (Signed)
Ongoing grief with increased feelings of wanting to be isolated and very agitated with family who are expressing concern and rightly so about her safety alone at home and the need for some help , at visit pt totally disagrees with this opinion and is extremely offended Will try to see if she will  get [facility] to be involved  with helping to determine her needs

## 2023-03-16 NOTE — Telephone Encounter (Signed)
Pks call pt let her know tat she was very upset at her visit on Tuesday, Iunderstanf her feelings and I also hear the valid concerns of her family as voiced by her grandson I ama ssiking if she will agree to a social worker/ case worker/ Nurse, learning disability and work with her and her family to help to assess needs an the help that is available Tell her also htat se had an excellent bloo pressure at the visit which is great Plslet me know her esponse, thanks

## 2023-03-16 NOTE — Progress Notes (Signed)
[name]     MRN: [id number]      DOB: 05-19-1934  Chief Complaint  Patient presents with   memory test    HPI [name] is here for follow up and re-evaluation of chronic medical conditions, medication management and review of any available recent lab and radiology data.  Preventive health is updated, specifically  Cancer screening and Immunization.   She is accompanied by her [name], who is listed as DPR, and the stated reason for the visit is memory testing. [name] states she is extremely offended about the way that the visit was organized witout her consent to discuss memory issues. States she is capable of living alone which she wants o o, and also states she will  know when to ask for help if needed. Grandson reports increased forgetfulness, episodes of " getting lost" in [place] where she lives, and a recent fall in a supermarket, which he heard of indirectly from his mother who lives in another state. He is hurt that his grandmother did not call/ tell him and he had to learn indirectly [name] feels as though she is now a bother to her family and does not want that to be the case. Grandson feels she should not be driving and is willing to trnsport her where she needs to go, she does not feel it necessary  ROS See HPI  . C/o chronic  joint pain, and limitation in mobility. Denies headaches, seizures, numbness, or tingling. C/o grief and depression, not suicidal or homicidal, grieving loss of hr long time friend less than 1 year ago, does not want therapy at this time.  . Denies skin break down or rash.   PE  BP 129/80   Pulse 81   Ht 5' (1.524 m)   Wt 139 lb 0.6 oz (63.1 kg)   SpO2 93%   BMI 27.15 kg/m   Patient alert and oriented and in no cardiopulmonary distress.  HEENT: No facial asymmetry, EOMI,     Neck decreased ROM .  Chest: Clear to auscultation bilaterally.  CVS: S1, S2 no murmurs, no S3.Regular rate.  ABD: Soft non tender.    Ext: No edema  Kaitlyn: markedly decreased ROM spine, shoulders, hips and knees.  Skin: Intact, no ulcerations or rash noted.  Psych: Good eye contact, agitated, upset and angry not at all in agreement with information/ concerns shared by her grandson, agreed to do MMSE and stopped the interview before it was 50 % completed, stating she had had enough   CNS: CN 2-12 intact, power,  normal throughout.no focal deficits noted.   Assessment & Plan  Hypothyroid Controlled, no change in medication   Essential hypertension Controlled, no change in medication   Grief Ongoing grief with increased feelings of wanting to be isolated and very agitated with family who are expressing concern and rightly so about her safety alone at home and the need for some help , at visit pt totally disagrees with this opinion and is extremely offended Will try to see if she will  get [facility] to be involved  with helping to determine her needs   Lumbar spondylosis Chronic pain management through pain clinic, at increased fall risk and has limitations in ability to do housekeeping due to severe arthritis, would benefit from and in home assistance on some basis   Hyperlipidemia Hyperlipidemia:Low fat diet discussed and encouraged.   Lipid Panel  Lab Results  Component Value Date   CHOL 194 12/10/2022  HDL 54 12/10/2022   LDLCALC 121 (H) 12/10/2022   TRIG 106 12/10/2022   CHOLHDL 3.6 12/10/2022     Not at goal Updated lab needed at/ before next visit.   Forgetfulness Episodes of forgetfulness noted to be increased in frequency in past 4 to 8 weeks , family concerned and is wanting to request in home assistance Pt did approx 50 % of MSE , score nearly 100 % on thos questions then stopped the test , stating hse had had enough [facility] consult will be beneficial to work with pt and her family, hopefully she will agree

## 2023-03-16 NOTE — Assessment & Plan Note (Signed)
Hyperlipidemia:Low fat diet discussed and encouraged.   Lipid Panel  Lab Results  Component Value Date   CHOL 194 12/10/2022   HDL 54 12/10/2022   LDLCALC 121 (H) 12/10/2022   TRIG 106 12/10/2022   CHOLHDL 3.6 12/10/2022     Not at goal Updated lab needed at/ before next visit.

## 2023-03-18 NOTE — Telephone Encounter (Signed)
[id number] 

## 2023-03-29 DIAGNOSIS — G894 Chronic pain syndrome: Secondary | ICD-10-CM | POA: Diagnosis not present

## 2023-03-29 DIAGNOSIS — M47816 Spondylosis without myelopathy or radiculopathy, lumbar region: Secondary | ICD-10-CM | POA: Diagnosis not present

## 2023-03-29 DIAGNOSIS — M545 Low back pain, unspecified: Secondary | ICD-10-CM | POA: Diagnosis not present

## 2023-03-29 DIAGNOSIS — Z79891 Long term (current) use of opiate analgesic: Secondary | ICD-10-CM | POA: Diagnosis not present

## 2023-04-11 DIAGNOSIS — M47816 Spondylosis without myelopathy or radiculopathy, lumbar region: Secondary | ICD-10-CM | POA: Diagnosis not present

## 2023-04-28 DIAGNOSIS — G894 Chronic pain syndrome: Secondary | ICD-10-CM | POA: Diagnosis not present

## 2023-04-28 DIAGNOSIS — Z79891 Long term (current) use of opiate analgesic: Secondary | ICD-10-CM | POA: Diagnosis not present

## 2023-04-28 DIAGNOSIS — M545 Low back pain, unspecified: Secondary | ICD-10-CM | POA: Diagnosis not present

## 2023-04-28 DIAGNOSIS — M47816 Spondylosis without myelopathy or radiculopathy, lumbar region: Secondary | ICD-10-CM | POA: Diagnosis not present

## 2023-05-10 ENCOUNTER — Encounter: Payer: Self-pay | Admitting: Family Medicine

## 2023-05-10 ENCOUNTER — Ambulatory Visit (INDEPENDENT_AMBULATORY_CARE_PROVIDER_SITE_OTHER): Payer: Medicare HMO | Admitting: Family Medicine

## 2023-05-10 VITALS — BP 170/90 | HR 76 | Ht 63.0 in | Wt 137.1 lb

## 2023-05-10 DIAGNOSIS — M159 Polyosteoarthritis, unspecified: Secondary | ICD-10-CM

## 2023-05-10 DIAGNOSIS — I10 Essential (primary) hypertension: Secondary | ICD-10-CM | POA: Diagnosis not present

## 2023-05-10 DIAGNOSIS — R296 Repeated falls: Secondary | ICD-10-CM

## 2023-05-10 DIAGNOSIS — E7800 Pure hypercholesterolemia, unspecified: Secondary | ICD-10-CM

## 2023-05-10 DIAGNOSIS — F321 Major depressive disorder, single episode, moderate: Secondary | ICD-10-CM | POA: Diagnosis not present

## 2023-05-10 DIAGNOSIS — Z23 Encounter for immunization: Secondary | ICD-10-CM

## 2023-05-10 DIAGNOSIS — F4321 Adjustment disorder with depressed mood: Secondary | ICD-10-CM | POA: Diagnosis not present

## 2023-05-10 DIAGNOSIS — E039 Hypothyroidism, unspecified: Secondary | ICD-10-CM | POA: Diagnosis not present

## 2023-05-10 DIAGNOSIS — Z9189 Other specified personal risk factors, not elsewhere classified: Secondary | ICD-10-CM

## 2023-05-10 MED ORDER — SERTRALINE HCL 25 MG PO TABS: 25.0000 mg | ORAL_TABLET | Freq: Every day | ORAL | 3 refills | Status: AC

## 2023-05-10 NOTE — Patient Instructions (Addendum)
F/U in 6 to 8  weeks, re evaluate depression and an blood pressure, call if you need me sooner  N ame and contact tele # of her daughter [name] need to be added on the [facility] before she leaves today please   TSH, cmp and EGFR today  Flu vaccine in office today   BP is high, you have been forgetting the amoldipine 10 mg tablet, you need to take all of your medications every day  PLEASE let your Grandson pack out your medications for the week with you, so that you keep things controlled  New for depression and grief is zoloft 25 mg one daily, you have been on this medication before  You are being referred to Therapist, very important for you to share your feelings of  sadness and grief to get to a better place mentally and emotionally  NO ONE is pushing you  or trying to take away your independence, family is only showing love and trying to protect you, we are all INTERDEPENDENT  I would like you to go to PT for 4 weeks even once per week to reduce fall risk  I am going to request social  worker to work with you and your family to improve things for everyone  Thanks for choosing [facility], we consider it a privelige to serve you.

## 2023-05-11 LAB — CMP14+EGFR
ALT: 11 IU/L (ref 0–32)
AST: 28 IU/L (ref 0–40)
Albumin: 4.3 g/dL (ref 3.7–4.7)
Alkaline Phosphatase: 98 IU/L (ref 44–121)
BUN/Creatinine Ratio: 16 (ref 12–28)
BUN: 16 mg/dL (ref 8–27)
Bilirubin Total: 0.5 mg/dL (ref 0.0–1.2)
CO2: 25 mmol/L (ref 20–29)
Calcium: 9.7 mg/dL (ref 8.7–10.3)
Chloride: 97 mmol/L (ref 96–106)
Creatinine, Ser: 1.02 mg/dL — ABNORMAL HIGH (ref 0.57–1.00)
Globulin, Total: 2.7 g/dL (ref 1.5–4.5)
Glucose: 80 mg/dL (ref 70–99)
Potassium: 4.5 mmol/L (ref 3.5–5.2)
Sodium: 138 mmol/L (ref 134–144)
Total Protein: 7 g/dL (ref 6.0–8.5)
eGFR: 54 mL/min/{1.73_m2} — ABNORMAL LOW (ref >59)

## 2023-05-11 LAB — TSH: TSH: 5.45 u[IU]/mL — ABNORMAL HIGH (ref 0.450–4.500)

## 2023-05-17 ENCOUNTER — Encounter: Payer: Self-pay | Admitting: Family Medicine

## 2023-05-17 ENCOUNTER — Telehealth: Payer: Self-pay | Admitting: *Deleted

## 2023-05-17 DIAGNOSIS — F321 Major depressive disorder, single episode, moderate: Secondary | ICD-10-CM | POA: Insufficient documentation

## 2023-05-17 DIAGNOSIS — R296 Repeated falls: Secondary | ICD-10-CM | POA: Insufficient documentation

## 2023-05-17 NOTE — Progress Notes (Signed)
[name]     MRN: [id number]      DOB: Nov 15, 1933  Chief Complaint  Patient presents with   Follow-up    Bp follow up    HPI [name] is here for follow up and re-evaluation of chronic medical conditions, medication management and review of any available recent lab and radiology data.  Preventive health is updated, specifically  Cancer screening and Immunization.   Questions or concerns regarding consultations or procedures which the PT has had in the interim are  addressed. The PT denies any adverse reactions to current medications since the last visit. Hs been forgetting to take her amlodipine as prescribed and BP high at visit Has had another fall since last visit Not allowing grandson to pack her medications consistently Has not visited/ stayed with her daughter [name] who is responsible for her since she relocated to [facility] Continues t feel she is being pushed into moving to live with her and as though her independence is being taken from her Feels sad , cries often, withdrawn, having great difficulty with loss of her female friend who has been in her life for at least 50 years   Daughter [name] contacted during the visit to get her input into her Mother's health, her son , id the family member on spot whio is diectly responsible for [name]  ROS Denies recent fever or chills. Denies sinus pressure, nasal congestion, ear pain or sore throat. Denies chest congestion, productive cough or wheezing. Denies chest pains, palpitations and leg swelling Denies abdominal pain, nausea, vomiting,diarrhea or constipation.   Denies dysuria, frequency, hesitancy or incontinence. Chronic  joint pain, swelling and limitation in mobility. Denies headaches, seizures, numbness, or tingling.  Denies skin break down or rash.   PE  BP (!) 170/90   Pulse 76   Ht 5\' 3"  (1.6 m)   Wt 137 lb 1.9 oz (62.2 kg)   SpO2 93%   BMI 24.29 kg/m   Patient alert and oriented tearful nd  anxious   HEENT: No facial asymmetry, EOMI,     Neck supple .  Chest: Clear to auscultation bilaterally.  CVS: S1, S2 no murmurs, no S3.Regular rate.  ABD: Soft non tender.   Ext: No edema  Kaitlyn: Adequate ROM spine, shoulders, hips and knees.  Skin: Intact, no ulcerations or rash noted.  Psych: Good eye contact, normal affect. Memory intact not anxious or depressed appearing.  CNS: CN 2-12 intact, power,  normal throughout.no focal deficits noted.   Assessment & Plan  Essential hypertension DASH diet and commitment to daily physical activity for a minimum of 30 minutes discussed and encouraged, as a part of hypertension management. The importance of attaining a healthy weight is also discussed.     05/10/2023    2:14 PM 05/10/2023    1:08 PM 05/10/2023    1:05 PM 03/15/2023    3:57 PM 12/14/2022    4:27 PM 12/10/2022   10:13 AM 12/10/2022   10:11 AM  BP/Weight  Systolic BP 170 168 175 129 122 170 170  Diastolic BP 90 108 103 80 80 110 110  Wt. (Lbs)   137.12 139.04 144    BMI   24.29 kg/m2 27.15 kg/m2 25.51 kg/m2       Uncontrolled as non compliant with medication, grandson to be allowed to assis with med management  Grief Prolonged grief and depression, agrees to medication and therapy , will refer , start zoloft  Hypothyroid No med change  Osteoarthritis Severe generalized osteo , pain management through clinic  Falls frequently Severe osteoarthtoritis, 2 falls in past 6 months, refer PT  Depression, major, single episode, moderate (HCC) Withdrawn, anxious, excessively tearful, mild paranoia and refusing help from closest family who cares about her thinks her independence is at risk , will benefit from therapy also antidepressant started

## 2023-05-17 NOTE — Assessment & Plan Note (Signed)
No med change 

## 2023-05-17 NOTE — Assessment & Plan Note (Signed)
Severe generalized osteo , pain management through clinic

## 2023-05-17 NOTE — Assessment & Plan Note (Signed)
DASH diet and commitment to daily physical activity for a minimum of 30 minutes discussed and encouraged, as a part of hypertension management. The importance of attaining a healthy weight is also discussed.     05/10/2023    2:14 PM 05/10/2023    1:08 PM 05/10/2023    1:05 PM 03/15/2023    3:57 PM 12/14/2022    4:27 PM 12/10/2022   10:13 AM 12/10/2022   10:11 AM  BP/Weight  Systolic BP 170 168 175 129 122 170 170  Diastolic BP 90 108 103 80 80 110 110  Wt. (Lbs)   137.12 139.04 144    BMI   24.29 kg/m2 27.15 kg/m2 25.51 kg/m2       Uncontrolled as non compliant with medication, grandson to be allowed to assis with med management

## 2023-05-17 NOTE — Assessment & Plan Note (Signed)
Withdrawn, anxious, excessively tearful, mild paranoia and refusing help from closest family who cares about her thinks her independence is at risk , will benefit from therapy also antidepressant started

## 2023-05-17 NOTE — Assessment & Plan Note (Signed)
Hyperlipidemia:Low fat diet discussed and encouraged.   Lipid Panel  Lab Results  Component Value Date   CHOL 194 12/10/2022   HDL 54 12/10/2022   LDLCALC 121 (H) 12/10/2022   TRIG 106 12/10/2022   CHOLHDL 3.6 12/10/2022     Needs to reduce fried and fatty foods

## 2023-05-17 NOTE — Progress Notes (Signed)
  Care Coordination   Note   05/17/2023 Name: [name] MRN: [id number] DOB: [birth year]  [name] is a 89 y.o. year old female who sees [name], MD for primary care. I reached out to [name] by phone today to offer care coordination services.  Ms. Cancilla was given information about Care Coordination services today including:   The Care Coordination services include support from the care team which includes your Nurse Coordinator, Clinical Social Worker, or Pharmacist.  The Care Coordination team is here to help remove barriers to the health concerns and goals most important to you. Care Coordination services are voluntary, and the patient may decline or stop services at any time by request to their care team member.   Care Coordination Consent Status: Patient agreed to services and verbal consent obtained.   Follow up plan:  Telephone appointment with care coordination team member scheduled for:  [facility] 05/20/23 and SW 06/01/23  Encounter Outcome:  Patient Scheduled  [facility]  Care Coordination Care Guide  Direct Dial: [phone]

## 2023-05-17 NOTE — Assessment & Plan Note (Signed)
Prolonged grief and depression, agrees to medication and therapy , will refer , start zoloft

## 2023-05-17 NOTE — Assessment & Plan Note (Signed)
Severe osteoarthtoritis, 2 falls in past 6 months, refer PT

## 2023-05-18 ENCOUNTER — Telehealth: Payer: Self-pay | Admitting: Family Medicine

## 2023-05-18 NOTE — Telephone Encounter (Signed)
Patient called in requesting call  back.  Patient is wanting to know the reasoning behind physical therapy referral. Wants to know if this is necessary since she is having pain management.

## 2023-05-18 NOTE — Telephone Encounter (Signed)
Patient aware has an appointment Friday

## 2023-05-18 NOTE — Telephone Encounter (Signed)
Patient called asked if physical therapy does not help after her first visit does she have to continue this for her back, she has had surgeries on her back.

## 2023-05-18 NOTE — Telephone Encounter (Signed)
Patient aware.

## 2023-05-19 NOTE — Therapy (Addendum)
OUTPATIENT PHYSICAL THERAPY NEURO EVALUATION   Patient Name: [name] MRN: [id number] DOB:1933/10/06, 89 y.o., female Today's Date: 05/20/2023   PCP: Syliva Overman MD REFERRING PROVIDER: Syliva Overman MD  END OF SESSION:  PT End of Session - 05/20/23 1607     Visit Number 1    Date for PT Re-Evaluation 07/01/23    Authorization Type [employer]    Authorization Time Period 27 visit limit between PT/OT    Progress Note Due on Visit 10    PT Start Time 1435    PT Stop Time 1510    PT Time Calculation (min) 35 min    Behavior During Therapy WFL for tasks assessed/performed             Past Medical History:  Diagnosis Date   Anemia    Blood clot associated with vein wall inflammation    hx of in right leg   CAD (coronary artery disease)    Carpal tunnel syndrome on left    Left Hand pain with numbness, new onset    Chronic back pain    Chronic back pain    buldging disc   Constipation    Diarrhea    Difficult intubation    small airway   DJD (degenerative joint disease)    Severe   Gastric ulcer    hx of    GERD (gastroesophageal reflux disease)    takes Omeprazole daily   Headache(784.0)    occasionally   Hyperlipidemia    takes Lipitor nightly   Hypertension    takes Amlodipine and Metoprolol daily   Nocturia    Obesity    Pneumonia    hx of-in 2011   Shortness of breath    with exertion   Sleep apnea    sleep study done 32yrs ago   Thyroid mass    right   Past Surgical History:  Procedure Laterality Date   ABDOMINAL HYSTERECTOMY     APPENDECTOMY  1987   Arthoscopy left knee  1992   Arthroscopy left shoulder  1999   Back  Surgery  lumbar  1989 /1999   x 2   BACK SURGERY     CARDIAC CATHETERIZATION  05/2010   CARDIAC CATHETERIZATION N/A 09/30/2015   Procedure: Left Heart Cath and Coronary Angiography;  Surgeon: Lennette Bihari, MD;  Location: [facility] INVASIVE [facility];  Service: Cardiovascular;  Laterality: N/A;   Carpal tunnel  release right     x 2   CHOLECYSTECTOMY     COLONOSCOPY     ESOPHAGOGASTRODUODENOSCOPY     LUMBAR FUSION  March 19, 2014   NM MYOCAR PERF WALL MOTION  11/12/2008   no ischemia   PARTIAL HYSTERECTOMY  1972   ROTATOR CUFF REPAIR     right   SPINE SURGERY     THYROIDECTOMY  11/17/2011   Procedure: THYROIDECTOMY;  Surgeon: Darletta Moll, MD;  [facility] OR;  Service: ENT;  Laterality: Right;  WITH FROZEN SECTION   TONSILLECTOMY     Patient Active Problem List   Diagnosis Date Noted   Falls frequently 05/17/2023   Depression, major, single episode, moderate (HCC) 05/17/2023   Forgetfulness 03/16/2023   Vitamin D deficiency 12/10/2022   Grief 12/10/2022   Encounter for Medicare annual examination with abnormal findings 07/27/2022   Chronic pain syndrome 11/20/2021   Long term (current) use of opiate analgesic 11/20/2021   Primary osteoarthritis of right knee 11/20/2021   Lumbar spondylosis 11/20/2021  Sciatica 11/20/2021   Retinoschisis, right 04/21/2020   Cortical age-related cataract of right eye 04/21/2020   Cortical age-related cataract of left eye 04/21/2020   Age-related nuclear cataract of right eye 04/21/2020   Age-related nuclear cataract of left eye 04/21/2020   Angioedema 02/02/2019   Seafood allergy 01/31/2017   Overweight (BMI 25.0-29.9) 10/12/2016   CAD in native artery    Osteoarthritis 04/17/2013   Coronary vasospasm (HCC) 02/07/2013   Hypothyroid 08/14/2012   Prediabetes 10/10/2011   CARPAL TUNNEL SYNDROME, BILATERAL 02/19/2010   GERD 09/15/2009   Coronary atherosclerosis 10/01/2008   DYSPEPSIA 07/07/2008   Back pain of lumbar region with sciatica 07/03/2008   Hyperlipidemia 11/24/2007   Essential hypertension 11/24/2007    ONSET DATE: ~ 6 months ago  REFERRING DIAG:  M15.9 (ICD-10-CM) - Osteoarthritis of multiple joints, unspecified osteoarthritis type  R29.6 (ICD-10-CM) - Falls frequently    THERAPY DIAG:  Generalized osteoarthrosis, involving  multiple sites  Falls frequently  Muscle weakness (generalized)  Rationale for Evaluation and Treatment: Rehabilitation  SUBJECTIVE:                                                                                                                                                                                             SUBJECTIVE STATEMENT: Pt reports MD recommended some PT due to four back surgeries in Lumbar spine.  grandson reports more conern about the number of falls an leg weakness. Pt is seeing pain management.   Pt lives alone, and does "whatever." Pt still drives and walks to mailbox daily.   Pt accompanied by:  grandson  PERTINENT HISTORY: 4 Lumbar spine surgeries.   PAIN:  Are you having pain? Yes: NPRS scale: 8/10 Pain location: points to lumbar spine Pain description: sharp, sore. Aggravating factors: moving Relieving factors: "when I'm not thinking about it"  PRECAUTIONS: None  RED FLAGS: None   WEIGHT BEARING RESTRICTIONS: No  FALLS: Has patient fallen in last 6 months? Yes. Number of falls 2  LIVING ENVIRONMENT: Lives with: lives alone and family lives close by Lives in: House/apartment Stairs:  3 steps to patio Has following equipment at home: Quad cane small base  PLOF: Independent  PATIENT GOALS: "get off pain meds"  OBJECTIVE:   COGNITION: Overall cognitive status: Within functional limits for tasks assessed   SENSATION: WFL   POSTURE: rounded shoulders, forward head, and increased thoracic kyphosis  LOWER EXTREMITY ROM:     Active  Right Eval Left Eval  Hip flexion    Hip extension    Hip abduction    Hip adduction    Hip internal rotation  Hip external rotation    Knee flexion    Knee extension    Ankle dorsiflexion    Ankle plantarflexion    Ankle inversion    Ankle eversion     (Blank rows = not tested)  LOWER EXTREMITY MMT:    MMT Right Eval Left Eval  Hip flexion    Hip extension    Hip abduction    Hip  adduction    Hip internal rotation    Hip external rotation    Knee flexion    Knee extension 3+ 3+  Ankle dorsiflexion 3+ 3+  Ankle plantarflexion    Ankle inversion    Ankle eversion    (Blank rows = not tested)  GAIT: Gait pattern: decreased step length- Right, decreased step length- Left, decreased stride length, shuffling, trunk flexed, poor foot clearance- Right, and poor foot clearance- Left Distance walked: 130ft Assistive device utilized: None Level of assistance: Complete Independence Comments: slow, excessive kyphosis during ambulation.   FUNCTIONAL TESTS:  30 seconds chair stand test: 9x Timed up and go (TUG): 17 seconds DGI 1. Gait level surface (2) Mild Impairment: Walks 20', uses assistive devices, slower speed, mild gait deviations. 2. Change in gait speed (2) Mild Impairment: Is able to change speed but demonstrates mild gait deviations, or not gait deviations but unable to achieve a significant change in velocity, or uses an assistive device. 3. Gait with horizontal head turns (2) Mild Impairment: Performs head turns smoothly with slight change in gait velocity, i.e., minor disruption to smooth gait path or uses walking aid. 4. Gait with vertical head turns (2) Mild Impairment: Performs head turns smoothly with slight change in gait velocity, i.e., minor disruption to smooth gait path or uses walking aid. 5. Gait and pivot turn (3) Normal: Pivot turns safely within 3 seconds and stops quickly with no loss of balance. 6. Step over obstacle (0) Severe Impairment: Cannot perform without assistance. 7. Step around obstacles (2) Mild Impairment: Is able to step around both cones, but must slow down and adjust steps to clear cones. 8. Stairs (1) Moderate Impairment: Two feet to a stair, must use rail.  TOTAL SCORE: 14 / 24  PATIENT SURVEYS:  ABC scale 51.875% confidence  TODAY'S TREATMENT:                                                                                                                               DATE: PT Evaluation, findings, prognosis, frequency.     PATIENT EDUCATION: Education details: PT Evaluation, findings, prognosis, frequency Person educated: Patient Education method: Medical illustrator Education comprehension: verbalized understanding  HOME EXERCISE PROGRAM: Next session.   GOALS: Goals reviewed with patient? No  SHORT TERM GOALS: Target date: 06/17/2023  Pt will be independent with HEP in order to demonstrate participation in Physical Therapy POC.  Baseline: Goal status: INITIAL  2.  Pt will report > 25% improvement in subjective safety/balance concerns in order  to demonstrate improved self awareness of balance deficits.  Baseline:  Goal status: INITIAL  LONG TERM GOALS: Target date: 07/15/2023  Pt will increase DGI score by > 8 in order to demonstrate improved functional safety and balance skills in ADL/mobility.   Baseline: see objective Goal status: INITIAL  2.  Pt will improve TUG by > 3 seconds in order to demonstrate improved functional mobility capacity in community setting.  Baseline: see objective Goal status: INITIAL  3.  Pt will improve ABC score by MCID in order to demonstrate improved pain with functional goals and outcomes. Baseline: see objective Goal status: INITIAL  4.  Pt will report > 50% improvement in subjective safety/balance concerns in order to demonstrate improved self awareness of balance deficits..  Baseline:  Goal status: INITIAL  ASSESSMENT:  CLINICAL IMPRESSION: Patient is a 89 y.o. female who was seen today for physical therapy evaluation and treatment for  M15.9 (ICD-10-CM) - Osteoarthritis of multiple joints, unspecified osteoarthritis type  R29.6 (ICD-10-CM) - Falls frequently  Pt demonstrating deficits in safety during ambulation, functional mobiltiy, and transfers due to muscular weakness, increased abnormal posture, pain, and poor balance reactions.  Pt would benefit from skilled PT services to address deficits and improve safety with functional activities. .   OBJECTIVE IMPAIRMENTS: Abnormal gait, decreased activity tolerance, decreased balance, decreased mobility, difficulty walking, decreased ROM, decreased strength, and pain.   ACTIVITY LIMITATIONS: carrying, lifting, bending, sitting, standing, squatting, stairs, transfers, and locomotion level  PARTICIPATION LIMITATIONS: shopping, community activity, occupation, and yard work  PERSONAL FACTORS: Age are also affecting patient's functional outcome.   REHAB POTENTIAL: Good  CLINICAL DECISION MAKING: Stable/uncomplicated  EVALUATION COMPLEXITY: Low  PLAN:  PT FREQUENCY: 2x/week  PT DURATION: 6 weeks  PLANNED INTERVENTIONS: Therapeutic exercises, Therapeutic activity, Neuromuscular re-education, Balance training, Gait training, Patient/Family education, Self Care, Joint mobilization, Manual therapy, and Re-evaluation  PLAN FOR NEXT SESSION: BLE strengthening, calf stretching, postural awareness. UE exercises for posture.    Nelida Meuse, PT 05/20/2023, 4:15 PM

## 2023-05-20 ENCOUNTER — Encounter (HOSPITAL_COMMUNITY): Payer: Self-pay

## 2023-05-20 ENCOUNTER — Other Ambulatory Visit: Payer: Self-pay

## 2023-05-20 ENCOUNTER — Ambulatory Visit: Payer: Self-pay | Admitting: *Deleted

## 2023-05-20 ENCOUNTER — Ambulatory Visit (HOSPITAL_COMMUNITY): Payer: Medicare HMO | Attending: Family Medicine

## 2023-05-20 DIAGNOSIS — R296 Repeated falls: Secondary | ICD-10-CM | POA: Diagnosis not present

## 2023-05-20 DIAGNOSIS — M6281 Muscle weakness (generalized): Secondary | ICD-10-CM | POA: Diagnosis not present

## 2023-05-20 DIAGNOSIS — M159 Polyosteoarthritis, unspecified: Secondary | ICD-10-CM | POA: Insufficient documentation

## 2023-05-20 NOTE — Patient Outreach (Addendum)
Care Coordination   Initial Visit Note   06/03/2023 updated note for 05/20/23 Name: [name] MRN: [id number] DOB: 07/18/1934  [name] is a 89 y.o. year old female who sees [name], MD for primary care. I spoke with  [name] by phone today.  What matters to the patients health and wellness today?  Pain management,  Pain management- for her back, unable to stand straight. History of infusion with screws Present medication not helping at this time. Concern with not becoming addicted to pain medication. Interest in the origin in her pain Discussed the importance of stretching, other pain interventions available in addition to pain medicines. She voiced appreciation of the discussion of other options She will consider outpatient therapy/rehab    discuss upcoming [facility] SW outreach for counseling and resources   Voiced understanding during the review of her pcp and specialists appointments   Memory She stated she was 63 then clarified after she is 76. She reports helping herself with her memory by writing sticky notes and posting them around her home. She voices she understands her daughters concern about her memory changes She discussed the passing of her parents    Goals Addressed             This Visit's Progress    home management of memory concern & pain management - [facility] care coordination   Not on track    Patient with receive outreaches from [facility] SW & RN CM Patient will attend all medical appointments Patient will take medications as ordered   Interventions Today    Flowsheet Row Most Recent Value  Chronic Disease   Chronic disease during today's visit Other  [memory pain management]  General Interventions   General Interventions Discussed/Reviewed General Interventions Discussed, Programmer, applications, Doctor Visits  Doctor Visits Discussed/Reviewed Doctor Visits Discussed, PCP, Specialist  [discussed her visits to her pcp , inquired  about neurology services for memory changes]  PCP/Specialist Visits Compliance with follow-up visit  [Confirm she is very appreciative of her pcp services for her and her family members]  Exercise Interventions   Exercise Discussed/Reviewed Exercise Discussed, Physical Activity  [encouraged]  Physical Activity Discussed/Reviewed Physical Activity Discussed, Types of exercise  [discussed walking, chair exercises, Exercise per PT guidance]  Education Interventions   Education Provided Provided Education, Provided Web-based Education  Provided Verbal Education On Mental Health/Coping with Illness, Exercise, Medication, Community Resources  [encouraged outreaches with [employer] SW for counseling & resources]  Mental Health Interventions   Mental Health Discussed/Reviewed Mental Health Discussed, Coping Strategies, Grief and Loss  Pharmacy Interventions   Pharmacy Dicussed/Reviewed Pharmacy Topics Discussed, Medications and their functions, Affording Medications  Safety Interventions   Safety Discussed/Reviewed Safety Discussed, Fall Risk, Home Safety  Home Safety Assistive Devices              SDOH assessments and interventions completed:  Yes     Care Coordination Interventions:  Yes, provided   Follow up plan: Follow up call scheduled for 06/03/23    Encounter Outcome:  Patient Visit Completed   Cala Bradford L. Noelle Penner, RN, BSN, CCM, Care Management Coordinator [phone]

## 2023-05-21 ENCOUNTER — Other Ambulatory Visit: Payer: Self-pay | Admitting: Family Medicine

## 2023-05-24 ENCOUNTER — Ambulatory Visit: Payer: Medicare HMO | Admitting: *Deleted

## 2023-05-24 DIAGNOSIS — G894 Chronic pain syndrome: Secondary | ICD-10-CM | POA: Diagnosis not present

## 2023-05-24 DIAGNOSIS — M545 Low back pain, unspecified: Secondary | ICD-10-CM | POA: Diagnosis not present

## 2023-05-24 DIAGNOSIS — M47816 Spondylosis without myelopathy or radiculopathy, lumbar region: Secondary | ICD-10-CM | POA: Diagnosis not present

## 2023-05-24 DIAGNOSIS — Z79891 Long term (current) use of opiate analgesic: Secondary | ICD-10-CM | POA: Diagnosis not present

## 2023-05-24 NOTE — Patient Outreach (Signed)
Care Coordination   05/24/2023  Name: [name] MRN: [id number] DOB: Jan 14, 1934   Care Coordination Outreach Attempts:  An unsuccessful telephone outreach was attempted today to offer the patient information about available care coordination services. HIPAA compliant messages left on voicemail for patient and daughter, providing contact information for CSW, encouraging them to return CSW's call at their earliest convenience.  Follow Up Plan:  Additional outreach attempts will be made to offer the patient care coordination information and services.   Encounter Outcome:  No Answer.   Care Coordination Interventions:  No, not indicated.    Danford Bad, BSW, MSW, Printmaker Social Work Case Set designer Health  [facility], Population [facility] Direct Dial: [phone]  Fax: [phone] Email: [email or web address]: [email or web address]

## 2023-05-27 ENCOUNTER — Encounter (HOSPITAL_COMMUNITY): Payer: Self-pay

## 2023-05-27 ENCOUNTER — Ambulatory Visit (HOSPITAL_COMMUNITY): Payer: Medicare HMO

## 2023-05-27 DIAGNOSIS — M159 Polyosteoarthritis, unspecified: Secondary | ICD-10-CM

## 2023-05-27 DIAGNOSIS — R296 Repeated falls: Secondary | ICD-10-CM

## 2023-05-27 DIAGNOSIS — M6281 Muscle weakness (generalized): Secondary | ICD-10-CM | POA: Diagnosis not present

## 2023-05-27 NOTE — Therapy (Signed)
OUTPATIENT PHYSICAL THERAPY NEURO EVALUATION   Patient Name: [name] MRN: [id number] DOB:03-Sep-1934, 89 y.o., female Today's Date: 05/27/2023   PCP: Syliva Overman MD REFERRING PROVIDER: Syliva Overman MD  END OF SESSION:  PT End of Session - 05/27/23 1515     Visit Number 2    Number of Visits 12    Authorization Type [employer]    Authorization Time Period 27 visit limit between PT/OT    Progress Note Due on Visit 10    PT Start Time 1435    PT Stop Time 1513    PT Time Calculation (min) 38 min    Equipment Utilized During Treatment Gait belt    Activity Tolerance Patient tolerated treatment well    Behavior During Therapy WFL for tasks assessed/performed              Past Medical History:  Diagnosis Date   Anemia    Blood clot associated with vein wall inflammation    hx of in right leg   CAD (coronary artery disease)    Carpal tunnel syndrome on left    Left Hand pain with numbness, new onset    Chronic back pain    Chronic back pain    buldging disc   Constipation    Diarrhea    Difficult intubation    small airway   DJD (degenerative joint disease)    Severe   Gastric ulcer    hx of    GERD (gastroesophageal reflux disease)    takes Omeprazole daily   Headache(784.0)    occasionally   Hyperlipidemia    takes Lipitor nightly   Hypertension    takes Amlodipine and Metoprolol daily   Nocturia    Obesity    Pneumonia    hx of-in 2011   Shortness of breath    with exertion   Sleep apnea    sleep study done 26yrs ago   Thyroid mass    right   Past Surgical History:  Procedure Laterality Date   ABDOMINAL HYSTERECTOMY     APPENDECTOMY  1987   Arthoscopy left knee  1992   Arthroscopy left shoulder  1999   Back  Surgery  lumbar  1989 /1999   x 2   BACK SURGERY     CARDIAC CATHETERIZATION  05/2010   CARDIAC CATHETERIZATION N/A 09/30/2015   Procedure: Left Heart Cath and Coronary Angiography;  Surgeon: Lennette Bihari, MD;   Location: [facility];  Service: Cardiovascular;  Laterality: N/A;   Carpal tunnel release right     x 2   CHOLECYSTECTOMY     COLONOSCOPY     ESOPHAGOGASTRODUODENOSCOPY     LUMBAR FUSION  March 19, 2014   NM MYOCAR PERF WALL MOTION  11/12/2008   no ischemia   PARTIAL HYSTERECTOMY  1972   ROTATOR CUFF REPAIR     right   SPINE SURGERY     THYROIDECTOMY  11/17/2011   Procedure: THYROIDECTOMY;  Surgeon: Darletta Moll, MD;  Location: [facility];  Service: ENT;  Laterality: Right;  WITH FROZEN SECTION   TONSILLECTOMY     Patient Active Problem List   Diagnosis Date Noted   Falls frequently 05/17/2023   Depression, major, single episode, moderate (HCC) 05/17/2023   Forgetfulness 03/16/2023   Vitamin D deficiency 12/10/2022   Grief 12/10/2022   Encounter for Medicare annual examination with abnormal findings 07/27/2022   Chronic pain syndrome 11/20/2021   Long term (current) use of  opiate analgesic 11/20/2021   Primary osteoarthritis of right knee 11/20/2021   Lumbar spondylosis 11/20/2021   Sciatica 11/20/2021   Retinoschisis, right 04/21/2020   Cortical age-related cataract of right eye 04/21/2020   Cortical age-related cataract of left eye 04/21/2020   Age-related nuclear cataract of right eye 04/21/2020   Age-related nuclear cataract of left eye 04/21/2020   Angioedema 02/02/2019   Seafood allergy 01/31/2017   Overweight (BMI 25.0-29.9) 10/12/2016   CAD in native artery    Osteoarthritis 04/17/2013   Coronary vasospasm (HCC) 02/07/2013   Hypothyroid 08/14/2012   Prediabetes 10/10/2011   CARPAL TUNNEL SYNDROME, BILATERAL 02/19/2010   GERD 09/15/2009   Coronary atherosclerosis 10/01/2008   DYSPEPSIA 07/07/2008   Back pain of lumbar region with sciatica 07/03/2008   Hyperlipidemia 11/24/2007   Essential hypertension 11/24/2007    ONSET DATE: ~ 6 months ago  REFERRING DIAG:  M15.9 (ICD-10-CM) - Osteoarthritis of multiple joints, unspecified osteoarthritis type  R29.6  (ICD-10-CM) - Falls frequently    THERAPY DIAG:  Generalized osteoarthrosis, involving multiple sites  Falls frequently  Muscle weakness (generalized)  Rationale for Evaluation and Treatment: Rehabilitation  SUBJECTIVE:                                                                                                                                                                                             SUBJECTIVE STATEMENT: 7/10 painin low back. Had doctor's appointment in [place] this past. Pt reports taking pain medication today. .  Eval:Pt reports MD recommended some PT due to four back surgeries in Lumbar spine.  grandson reports more conern about the number of falls an leg weakness. Pt is seeing pain management.   Pt lives alone, and does "whatever." Pt still drives and walks to mailbox daily.   Pt accompanied by:  grandson  PERTINENT HISTORY: 4 Lumbar spine surgeries.   PAIN:  Are you having pain? Yes: NPRS scale: 8/10 Pain location: points to lumbar spine Pain description: sharp, sore. Aggravating factors: moving Relieving factors: "when I'm not thinking about it"  PRECAUTIONS: None  RED FLAGS: None   WEIGHT BEARING RESTRICTIONS: No  FALLS: Has patient fallen in last 6 months? Yes. Number of falls 2  LIVING ENVIRONMENT: Lives with: lives alone and family lives close by Lives in: House/apartment Stairs:  3 steps to patio Has following equipment at home: Quad cane small base  PLOF: Independent  PATIENT GOALS: "get off pain meds"  OBJECTIVE:   COGNITION: Overall cognitive status: Within functional limits for tasks assessed   SENSATION: WFL   POSTURE: rounded shoulders, forward head, and increased thoracic kyphosis  LOWER EXTREMITY  ROM:     Active  Right Eval Left Eval  Hip flexion    Hip extension    Hip abduction    Hip adduction    Hip internal rotation    Hip external rotation    Knee flexion    Knee extension    Ankle  dorsiflexion    Ankle plantarflexion    Ankle inversion    Ankle eversion     (Blank rows = not tested)  LOWER EXTREMITY MMT:    MMT Right Eval Left Eval  Hip flexion    Hip extension    Hip abduction    Hip adduction    Hip internal rotation    Hip external rotation    Knee flexion    Knee extension 3+ 3+  Ankle dorsiflexion 3+ 3+  Ankle plantarflexion    Ankle inversion    Ankle eversion    (Blank rows = not tested)  GAIT: Gait pattern: decreased step length- Right, decreased step length- Left, decreased stride length, shuffling, trunk flexed, poor foot clearance- Right, and poor foot clearance- Left Distance walked: 133ft Assistive device utilized: None Level of assistance: Complete Independence Comments: slow, excessive kyphosis during ambulation.   FUNCTIONAL TESTS:  30 seconds chair stand test: 9x Timed up and go (TUG): 17 seconds DGI 1. Gait level surface (2) Mild Impairment: Walks 20', uses assistive devices, slower speed, mild gait deviations. 2. Change in gait speed (2) Mild Impairment: Is able to change speed but demonstrates mild gait deviations, or not gait deviations but unable to achieve a significant change in velocity, or uses an assistive device. 3. Gait with horizontal head turns (2) Mild Impairment: Performs head turns smoothly with slight change in gait velocity, i.e., minor disruption to smooth gait path or uses walking aid. 4. Gait with vertical head turns (2) Mild Impairment: Performs head turns smoothly with slight change in gait velocity, i.e., minor disruption to smooth gait path or uses walking aid. 5. Gait and pivot turn (3) Normal: Pivot turns safely within 3 seconds and stops quickly with no loss of balance. 6. Step over obstacle (0) Severe Impairment: Cannot perform without assistance. 7. Step around obstacles (2) Mild Impairment: Is able to step around both cones, but must slow down and adjust steps to clear cones. 8. Stairs (1)  Moderate Impairment: Two feet to a stair, must use rail.  TOTAL SCORE: 14 / 24  PATIENT SURVEYS:  ABC scale 51.875% confidence  TODAY'S TREATMENT:                                                                                                                              DATE:  05/27/2023  -3x30' tandem stance with no UE support; CGA for maintaining balance with intermittent UE touchdown on parallel bars -Seated shoulder horizontal abduction 3 x 12 with YTB- postural training -Sit/stands 2 x 10 with yellow theraball with blue foam on seat. -Side stepping in parallel bars  65ft x 5 with YTB around knees- CGA level.    05/20/2023 PT Evaluation, findings, prognosis, frequency.     PATIENT EDUCATION: Education details: PT Evaluation, findings, prognosis, frequency Person educated: Patient Education method: Medical illustrator Education comprehension: verbalized understanding  HOME EXERCISE PROGRAM: Access Code: [email or web address] URL: [email or web address] Date: 05/27/2023 Prepared by: Starling Manns  Exercises - Standing Tandem Balance with Counter Support  - 1 x daily - 7 x weekly - 3 sets - 10 reps - Seated Shoulder Horizontal Abduction with Resistance  - 1 x daily - 7 x weekly - 3 sets - 10 reps - Squat with Chair Touch  - 1 x daily - 7 x weekly - 3 sets - 10 reps  GOALS: Goals reviewed with patient? No  SHORT TERM GOALS: Target date: 06/17/2023  Pt will be independent with HEP in order to demonstrate participation in Physical Therapy POC.  Baseline: Goal status: INITIAL  2.  Pt will report > 25% improvement in subjective safety/balance concerns in order to demonstrate improved self awareness of balance deficits.  Baseline:  Goal status: INITIAL  LONG TERM GOALS: Target date: 07/15/2023  Pt will increase DGI score by > 8 in order to demonstrate improved functional safety and balance skills in ADL/mobility.   Baseline: see objective Goal status:  INITIAL  2.  Pt will improve TUG by > 3 seconds in order to demonstrate improved functional mobility capacity in community setting.  Baseline: see objective Goal status: INITIAL  3.  Pt will improve ABC score by MCID in order to demonstrate improved pain with functional goals and outcomes. Baseline: see objective Goal status: INITIAL  4.  Pt will report > 50% improvement in subjective safety/balance concerns in order to demonstrate improved self awareness of balance deficits..  Baseline:  Goal status: INITIAL  ASSESSMENT:  CLINICAL IMPRESSION: Pt tolerating session well. Overview HEP and goal. Shows reduced tolerance to activities and most assistance with narrow BOS balancing due to muscle weakness. Giving postural strengthening exericses to improve posture, as this plays a part of pt's balance deficits. Pt would benefit from skilled PT services to address deficits and improve safety with functional activities. .   OBJECTIVE IMPAIRMENTS: Abnormal gait, decreased activity tolerance, decreased balance, decreased mobility, difficulty walking, decreased ROM, decreased strength, and pain.   ACTIVITY LIMITATIONS: carrying, lifting, bending, sitting, standing, squatting, stairs, transfers, and locomotion level  PARTICIPATION LIMITATIONS: shopping, community activity, occupation, and yard work  PERSONAL FACTORS: Age are also affecting patient's functional outcome.   REHAB POTENTIAL: Good  CLINICAL DECISION MAKING: Stable/uncomplicated  EVALUATION COMPLEXITY: Low  PLAN:  PT FREQUENCY: 2x/week  PT DURATION: 6 weeks  PLANNED INTERVENTIONS: Therapeutic exercises, Therapeutic activity, Neuromuscular re-education, Balance training, Gait training, Patient/Family education, Self Care, Joint mobilization, Manual therapy, and Re-evaluation  PLAN FOR NEXT SESSION: BLE strengthening, calf stretching, postural awareness. UE exercises for posture.    Nelida Meuse, PT 05/27/2023, 3:16  PM

## 2023-05-31 ENCOUNTER — Encounter (HOSPITAL_COMMUNITY): Payer: Self-pay

## 2023-05-31 ENCOUNTER — Ambulatory Visit (HOSPITAL_COMMUNITY): Payer: Medicare HMO

## 2023-05-31 DIAGNOSIS — R296 Repeated falls: Secondary | ICD-10-CM | POA: Diagnosis not present

## 2023-05-31 DIAGNOSIS — M159 Polyosteoarthritis, unspecified: Secondary | ICD-10-CM

## 2023-05-31 DIAGNOSIS — M6281 Muscle weakness (generalized): Secondary | ICD-10-CM

## 2023-05-31 NOTE — Therapy (Signed)
OUTPATIENT PHYSICAL THERAPY NEURO EVALUATION   Patient Name: [name] MRN: [id number] DOB:09-20-1933, 89 y.o., female Today's Date: 05/31/2023   PCP: Syliva Overman MD REFERRING PROVIDER: Syliva Overman MD  END OF SESSION:  PT End of Session - 05/31/23 1341     Visit Number 3    Number of Visits 12    Date for PT Re-Evaluation 07/01/23    Authorization Type [employer] Medicare    Authorization Time Period 27 visit limit between PT/OT    Authorization - Visit Number 2    Progress Note Due on Visit 10    PT Start Time 1307    PT Stop Time 1344    PT Time Calculation (min) 37 min    Equipment Utilized During Treatment Gait belt    Activity Tolerance Patient tolerated treatment well    Behavior During Therapy WFL for tasks assessed/performed               Past Medical History:  Diagnosis Date   Anemia    Blood clot associated with vein wall inflammation    hx of in right leg   CAD (coronary artery disease)    Carpal tunnel syndrome on left    Left Hand pain with numbness, new onset    Chronic back pain    Chronic back pain    buldging disc   Constipation    Diarrhea    Difficult intubation    small airway   DJD (degenerative joint disease)    Severe   Gastric ulcer    hx of    GERD (gastroesophageal reflux disease)    takes Omeprazole daily   Headache(784.0)    occasionally   Hyperlipidemia    takes Lipitor nightly   Hypertension    takes Amlodipine and Metoprolol daily   Nocturia    Obesity    Pneumonia    hx of-in 2011   Shortness of breath    with exertion   Sleep apnea    sleep study done 3yrs ago   Thyroid mass    right   Past Surgical History:  Procedure Laterality Date   ABDOMINAL HYSTERECTOMY     APPENDECTOMY  1987   Arthoscopy left knee  1992   Arthroscopy left shoulder  1999   Back  Surgery  lumbar  1989 /1999   x 2   BACK SURGERY     CARDIAC CATHETERIZATION  05/2010   CARDIAC CATHETERIZATION N/A 09/30/2015   Procedure:  Left Heart Cath and Coronary Angiography;  Surgeon: Lennette Bihari, MD;  Location: [facility];  Service: Cardiovascular;  Laterality: N/A;   Carpal tunnel release right     x 2   CHOLECYSTECTOMY     COLONOSCOPY     ESOPHAGOGASTRODUODENOSCOPY     LUMBAR FUSION  March 19, 2014   NM MYOCAR PERF WALL MOTION  11/12/2008   no ischemia   PARTIAL HYSTERECTOMY  1972   ROTATOR CUFF REPAIR     right   SPINE SURGERY     THYROIDECTOMY  11/17/2011   Procedure: THYROIDECTOMY;  Surgeon: Darletta Moll, MD;  Location: [facility] OR;  Service: ENT;  Laterality: Right;  WITH FROZEN SECTION   TONSILLECTOMY     Patient Active Problem List   Diagnosis Date Noted   Falls frequently 05/17/2023   Depression, major, single episode, moderate (HCC) 05/17/2023   Forgetfulness 03/16/2023   Vitamin D deficiency 12/10/2022   Grief 12/10/2022   Encounter for Medicare annual examination  with abnormal findings 07/27/2022   Chronic pain syndrome 11/20/2021   Long term (current) use of opiate analgesic 11/20/2021   Primary osteoarthritis of right knee 11/20/2021   Lumbar spondylosis 11/20/2021   Sciatica 11/20/2021   Retinoschisis, right 04/21/2020   Cortical age-related cataract of right eye 04/21/2020   Cortical age-related cataract of left eye 04/21/2020   Age-related nuclear cataract of right eye 04/21/2020   Age-related nuclear cataract of left eye 04/21/2020   Angioedema 02/02/2019   Seafood allergy 01/31/2017   Overweight (BMI 25.0-29.9) 10/12/2016   CAD in native artery    Osteoarthritis 04/17/2013   Coronary vasospasm (HCC) 02/07/2013   Hypothyroid 08/14/2012   Prediabetes 10/10/2011   CARPAL TUNNEL SYNDROME, BILATERAL 02/19/2010   GERD 09/15/2009   Coronary atherosclerosis 10/01/2008   DYSPEPSIA 07/07/2008   Back pain of lumbar region with sciatica 07/03/2008   Hyperlipidemia 11/24/2007   Essential hypertension 11/24/2007    ONSET DATE: ~ 6 months ago  REFERRING DIAG:  M15.9 (ICD-10-CM) -  Osteoarthritis of multiple joints, unspecified osteoarthritis type  R29.6 (ICD-10-CM) - Falls frequently    THERAPY DIAG:  Generalized osteoarthrosis, involving multiple sites  Falls frequently  Muscle weakness (generalized)  Rationale for Evaluation and Treatment: Rehabilitation  SUBJECTIVE:                                                                                                                                                                                             SUBJECTIVE STATEMENT: 4-5/10 pain mainly when moving.  Eval:Pt reports MD recommended some PT due to four back surgeries in Lumbar spine.  grandson reports more conern about the number of falls an leg weakness. Pt is seeing pain management.   Pt lives alone, and does "whatever." Pt still drives and walks to mailbox daily.   Pt accompanied by:  grandson  PERTINENT HISTORY: 4 Lumbar spine surgeries.   PAIN:  Are you having pain? Yes: NPRS scale: 8/10 Pain location: points to lumbar spine Pain description: sharp, sore. Aggravating factors: moving Relieving factors: "when I'm not thinking about it"  PRECAUTIONS: None  RED FLAGS: None   WEIGHT BEARING RESTRICTIONS: No  FALLS: Has patient fallen in last 6 months? Yes. Number of falls 2  LIVING ENVIRONMENT: Lives with: lives alone and family lives close by Lives in: House/apartment Stairs:  3 steps to patio Has following equipment at home: Quad cane small base  PLOF: Independent  PATIENT GOALS: "get off pain meds"  OBJECTIVE:   COGNITION: Overall cognitive status: Within functional limits for tasks assessed   SENSATION: WFL   POSTURE: rounded shoulders, forward head, and increased thoracic  kyphosis  LOWER EXTREMITY ROM:     Active  Right Eval Left Eval  Hip flexion    Hip extension    Hip abduction    Hip adduction    Hip internal rotation    Hip external rotation    Knee flexion    Knee extension    Ankle dorsiflexion     Ankle plantarflexion    Ankle inversion    Ankle eversion     (Blank rows = not tested)  LOWER EXTREMITY MMT:    MMT Right Eval Left Eval  Hip flexion    Hip extension    Hip abduction    Hip adduction    Hip internal rotation    Hip external rotation    Knee flexion    Knee extension 3+ 3+  Ankle dorsiflexion 3+ 3+  Ankle plantarflexion    Ankle inversion    Ankle eversion    (Blank rows = not tested)  GAIT: Gait pattern: decreased step length- Right, decreased step length- Left, decreased stride length, shuffling, trunk flexed, poor foot clearance- Right, and poor foot clearance- Left Distance walked: 172ft Assistive device utilized: None Level of assistance: Complete Independence Comments: slow, excessive kyphosis during ambulation.   FUNCTIONAL TESTS:  30 seconds chair stand test: 9x Timed up and go (TUG): 17 seconds DGI 1. Gait level surface (2) Mild Impairment: Walks 20', uses assistive devices, slower speed, mild gait deviations. 2. Change in gait speed (2) Mild Impairment: Is able to change speed but demonstrates mild gait deviations, or not gait deviations but unable to achieve a significant change in velocity, or uses an assistive device. 3. Gait with horizontal head turns (2) Mild Impairment: Performs head turns smoothly with slight change in gait velocity, i.e., minor disruption to smooth gait path or uses walking aid. 4. Gait with vertical head turns (2) Mild Impairment: Performs head turns smoothly with slight change in gait velocity, i.e., minor disruption to smooth gait path or uses walking aid. 5. Gait and pivot turn (3) Normal: Pivot turns safely within 3 seconds and stops quickly with no loss of balance. 6. Step over obstacle (0) Severe Impairment: Cannot perform without assistance. 7. Step around obstacles (2) Mild Impairment: Is able to step around both cones, but must slow down and adjust steps to clear cones. 8. Stairs (1) Moderate  Impairment: Two feet to a stair, must use rail.  TOTAL SCORE: 14 / 24  PATIENT SURVEYS:  ABC scale 51.875% confidence  TODAY'S TREATMENT:                                                                                                                              DATE:  05/31/2023  -Standing static balance on foam pad 3 x 1' -Standing foam pad marching single UE support 3x30' -1x30' tandem stance w/o UE support -Standing Rows 3x15 YTB CGA for balance; -Stepping balance reactions over black line 1 x 10 bilaterally; CGA, min  assist for one LOB laterally.  -Standing calf stretch 2 x 1'   05/27/2023  -3x30' tandem stance with no UE support; CGA for maintaining balance with intermittent UE touchdown on parallel bars -Seated shoulder horizontal abduction 3 x 12 with YTB- postural training -Sit/stands 2 x 10 with yellow theraball with blue foam on seat. -Side stepping in parallel bars 62ft x 5 with YTB around knees- CGA level.    05/20/2023 PT Evaluation, findings, prognosis, frequency.     PATIENT EDUCATION: Education details: PT Evaluation, findings, prognosis, frequency Person educated: Patient Education method: Medical illustrator Education comprehension: verbalized understanding  HOME EXERCISE PROGRAM: Access Code: [email or web address] URL: [email or web address] Date: 05/27/2023 Prepared by: Starling Manns  Exercises - Standing Tandem Balance with Counter Support  - 1 x daily - 7 x weekly - 3 sets - 10 reps - Seated Shoulder Horizontal Abduction with Resistance  - 1 x daily - 7 x weekly - 3 sets - 10 reps - Squat with Chair Touch  - 1 x daily - 7 x weekly - 3 sets - 10 reps  GOALS: Goals reviewed with patient? No  SHORT TERM GOALS: Target date: 06/17/2023  Pt will be independent with HEP in order to demonstrate participation in Physical Therapy POC.  Baseline: Goal status: INITIAL  2.  Pt will report > 25% improvement in subjective safety/balance concerns  in order to demonstrate improved self awareness of balance deficits.  Baseline:  Goal status: INITIAL  LONG TERM GOALS: Target date: 07/15/2023  Pt will increase DGI score by > 8 in order to demonstrate improved functional safety and balance skills in ADL/mobility.   Baseline: see objective Goal status: INITIAL  2.  Pt will improve TUG by > 3 seconds in order to demonstrate improved functional mobility capacity in community setting.  Baseline: see objective Goal status: INITIAL  3.  Pt will improve ABC score by MCID in order to demonstrate improved pain with functional goals and outcomes. Baseline: see objective Goal status: INITIAL  4.  Pt will report > 50% improvement in subjective safety/balance concerns in order to demonstrate improved self awareness of balance deficits..  Baseline:  Goal status: INITIAL  ASSESSMENT:  CLINICAL IMPRESSION: Pt tolerating session well. Focusing on static and dynamic standing balance in normal and narrow bases. Benefiting from [facility] for safety due to increased anterior and posterior sways with UE manipulation.Limitations noted in activity tolerance for balance challenges this session as well. Given ample rest breaks today. Pt would benefit from skilled PT services to address deficits and improve safety with functional activities. .   OBJECTIVE IMPAIRMENTS: Abnormal gait, decreased activity tolerance, decreased balance, decreased mobility, difficulty walking, decreased ROM, decreased strength, and pain.   ACTIVITY LIMITATIONS: carrying, lifting, bending, sitting, standing, squatting, stairs, transfers, and locomotion level  PARTICIPATION LIMITATIONS: shopping, community activity, occupation, and yard work  PERSONAL FACTORS: Age are also affecting patient's functional outcome.   REHAB POTENTIAL: Good  CLINICAL DECISION MAKING: Stable/uncomplicated  EVALUATION COMPLEXITY: Low  PLAN:  PT FREQUENCY: 2x/week  PT DURATION: 6 weeks  PLANNED  INTERVENTIONS: Therapeutic exercises, Therapeutic activity, Neuromuscular re-education, Balance training, Gait training, Patient/Family education, Self Care, Joint mobilization, Manual therapy, and Re-evaluation  PLAN FOR NEXT SESSION: BLE strengthening, calf stretching, postural awareness. UE exercises for posture.   Nelida Meuse PT, DPT Physical Therapist with [facility] 336 [phone] office   Nelida Meuse, PT 05/31/2023, 1:44 PM

## 2023-06-01 ENCOUNTER — Encounter: Payer: Self-pay | Admitting: *Deleted

## 2023-06-01 ENCOUNTER — Ambulatory Visit: Payer: Self-pay | Admitting: *Deleted

## 2023-06-01 NOTE — Patient Instructions (Signed)
Visit Information  Thank you for taking time to visit with me today. Please don't hesitate to contact me if I can be of assistance to you.   Following are the goals we discussed today:   Goals Addressed               This Visit's Progress     Assess Need for Social Work Involvement. (pt-stated)   On track     Care Coordination Intervention:  Interventions Today    Flowsheet Row Most Recent Value  Chronic Disease   Chronic disease during today's visit Other, Hypertension (HTN)  [Pre-diabetes, Unsteady Balance/Gait, Frequent Falls, Forgetfulness, Osteoarthritis, Depression, Grief, Chronic Pain Syndrome]  General Interventions   General Interventions Discussed/Reviewed General Interventions Discussed, Labs, Vaccines, Referral to Nurse, Health Screening, Doctor Visits, Annual Foot Exam, Lipid Profile, Communication with, Level of Care, [employer], General Interventions Reviewed, Horticulturist, commercial (DME), Annual Eye Exam  [Encouraged]  Labs Hgb A1c every 3 months, Kidney Function  [Encouraged]  Vaccines COVID-19, Tetanus/Pertussis/Diphtheria, Shingles, RSV, Pneumonia, Flu  [Encouraged]  Doctor Visits Discussed/Reviewed Doctor Visits Discussed, Specialist, Doctor Visits Reviewed, Annual Wellness Visits, PCP  [Encouraged]  Health Screening Mammogram, Colonoscopy, Bone Density  [Encouraged]  Durable Medical Equipment (DME) BP Cuff, Walker  [Encouraged]  PCP/Specialist Visits Compliance with follow-up visit  [Encouraged]  Communication with PCP/Specialists, RN, Pharmacists  [Encouraged]  Level of Care Adult Daycare, Personal Care Services, Applications, Assisted Living, Skilled Nursing Facility  [Encouraged]  Applications [place], Personal Care Services, [place]  [Encouraged]  Exercise Interventions   Exercise Discussed/Reviewed Exercise Discussed, Assistive device use and maintanence, Weight Managment, Exercise Reviewed, Physical Activity  [Encouraged]  Physical Activity  Discussed/Reviewed Physical Activity Discussed, Home Exercise Program (HEP), PREP, Gym, Physical Activity Reviewed, Types of exercise  [Encouraged]  Weight Management Weight maintenance  [Encouraged]  Education Interventions   Education Provided Provided Therapist, sports, Provided Web-based Education, Provided Education  [Encouraged]  Provided Verbal Education On Nutrition, Mental Health/Coping with Illness, When to see the doctor, Eye Care, Foot Care, Labs, Blood Sugar Monitoring, Applications, [employer], Exercise, Medication, Insurance Plans  [Encouraged]  Labs Reviewed --  [N/A]  Applications [place], Personal Care Services, Medicaid  [Encouraged]  Mental Health Interventions   Mental Health Discussed/Reviewed Mental Health Discussed, Anxiety, Grief and Loss, Depression, Mental Health Reviewed, Coping Strategies, Substance Abuse, Suicide, Other, Crisis  [Domestic Violence]  Nutrition Interventions   Nutrition Discussed/Reviewed Supplemental nutrition, Decreasing sugar intake, Decreasing salt, Portion sizes, Carbohydrate meal planning, Fluid intake, Nutrition Reviewed, Nutrition Discussed, Adding fruits and vegetables, Increasing proteins, Decreasing fats  [Encouraged]  Pharmacy Interventions   Pharmacy Dicussed/Reviewed Pharmacy Topics Discussed, Medications and their functions, Medication Adherence, Pharmacy Topics Reviewed, Affording Medications  [Encouraged]  Medication Adherence --  [N/A]  Safety Interventions   Safety Discussed/Reviewed Safety Discussed, Fall Risk, Safety Reviewed, Home Safety  [Encouraged]  Home Safety Assistive Devices, Need for home safety assessment, Refer for home visit, Refer for community resources  [Encouraged]  Advanced Directive Interventions   Advanced Directives Discussed/Reviewed Advanced Directives Discussed  [Encouraged]       Assessed Social Determinant of Health Barriers. Discussed Plans for Ongoing Care Management Follow Up. Provided Archivist Information for Care Management Team Members. Screened for Signs & Symptoms of Depression, Related to Chronic Disease State.  PHQ2 & PHQ9 Depression Screen Completed & Results Reviewed.  Suicidal Ideation & Homicidal Ideation Assessed - None Present.   Domestic Violence Assessed - None Present. Access to Weapons Assessed - None Present.   Active  Listening & Reflection Utilized.  Verbalization of Feelings Encouraged.  Emotional Support Provided. Symptoms of Grief Validated. Feelings of Anxiousness, Nervousness, & Restlessness Acknowledged. Educated on Proper Use of Deep Breathing Exercises, Relaxation Techniques, & Mindfulness Meditation Strategies, & Encouraged Daily Implementation. Self-Enrollment in Grief & Loss Support Group of Interest Emphasized, from List Provided. Crisis Support Information, Agencies, Services, & Resources Discussed. Problem Solving Interventions Identified. Task-Centered Solutions Implemented.   Solution-Focused Strategies Developed. Acceptance & Commitment Therapy Introduced. Brief Cognitive Behavioral Therapy Initiated. Client-Centered Therapy Enacted. Quality of Sleep Assessed & Sleep Hygiene Techniques Promoted. Confirmed Disinterest in Pursuing Higher Level of Care Placement Options, Denying Need for Assistance with Activities of Daily Living. Reviewed Prescription Medications & Discussed Importance of Compliance. Encouraged Administration of Medications, Exactly as Prescribed. Encouraged Routine Engagement in Activities of Interest, Inside & Outside the Home. Encouraged Increased Level of Activity & Exercise, as Tolerated. Encouraged Self-Enrollment with Psychiatrist of Interest in [facility], from List Provided, to Receive Psychotropic Medication Administration & Management, in An Effort to Reduce & Manage Symptoms of Depression & Grief. Encouraged Self-Enrollment with Therapist of Interest in [facility], from List Provided, to Receive  Psychotherapeutic Counseling & Supportive Services, in An Effort to Reduce & Manage Symptoms of Depression & Grief. Encouraged Engagement with Edd Arbour, Nurse Care Manager with [facility] [phone]), During Follow-Up Outreach Call, Scheduled on 06/03/2023 at 10:30 AM. Encouraged Attendance at Follow-Up Appointments with Starling Manns, Physical Therapist with [facility] at [place] [phone]), Scheduled on 06/03/2023 at 1:00 PM, 06/08/2023 at 2:30 PM, 06/14/2023 at 1:00 PM, 06/15/2023 at 2:30 PM, 06/22/2023 at 2:30 PM, 06/29/2023 at 2:30 PM, & 07/06/2023 at 2:30 PM. Encouraged Attendance at Follow-Up Appointments with Wilhemena Durie, Physical Therapist with [facility] at [place] [phone]), Scheduled on 06/07/2023 at 2:30 PM, 06/21/2023 at 2:30 PM, 06/28/2023 at 2:30 PM, & 07/05/2023 at 2:30 PM. Encouraged Contact with CSW (# [phone]), if You Have Questions, Need Assistance, or If Additional Social Work Needs Are Identified Between Now & Our Next Follow-Up Outreach Call, Scheduled on 06/15/2023 at 3:15 PM. Encouraged Attendance at Follow-Up Appointment with Dr. Syliva Overman, Primary Care Provider with [facility] [phone]# [phone]), Scheduled on 07/19/2023 at 1:40 PM.      Our next appointment is by telephone on 06/15/2023 at 3:15 pm.  Please call the care guide team at [phone] if you need to cancel or reschedule your appointment.   If you are experiencing a Mental Health or Behavioral Health Crisis or need someone to talk to, please call the Suicide and Crisis Lifeline: 988 call the [place] National Suicide Prevention Lifeline: 3154795957 or TTY: 419-164-1794 TTY 8483787549) to talk to a trained counselor call 1-800-273-TALK (toll free, 24 hour hotline) go to [facility] [address], [place] [phone]) call the [facility] Crisis Line: 7201681816 call 911  Patient verbalizes understanding of instructions and care plan provided today and agrees to view in MyChart. Active MyChart status and patient understanding of how to access instructions and care plan via MyChart confirmed with patient.     Telephone follow up appointment with care management team member scheduled for:   06/15/2023 at 3:15 pm.  Danford Bad, BSW, MSW, LCSW  Embedded Practice Social Work Case Manager  [facility], Population Health Direct Dial: [phone]  Fax: [phone] Email: [email or web address] Website: [email or web address]

## 2023-06-01 NOTE — Patient Outreach (Signed)
Care Coordination   Follow Up Visit Note   06/01/2023  Name: [name] MRN: [id number] DOB: [birth year]  [name] is a 89 y.o. year old female who sees Kaitlyn Perches, MD for primary care. I spoke with [name] by phone today.  What matters to the patients health and wellness today?  Assess Need for Social Work Involvement.    Goals Addressed               This Visit's Progress     Assess Need for Social Work Involvement. (pt-stated)   On track     Care Coordination Intervention:  Interventions Today    Flowsheet Row Most Recent Value  Chronic Disease   Chronic disease during today's visit Other, Hypertension (HTN)  [Pre-diabetes, Unsteady Balance/Gait, Frequent Falls, Forgetfulness, Osteoarthritis, Depression, Grief, Chronic Pain Syndrome]  General Interventions   General Interventions Discussed/Reviewed General Interventions Discussed, Labs, Vaccines, Referral to Nurse, Health Screening, Doctor Visits, Annual Foot Exam, Lipid Profile, Communication with, Level of Care, [employer], General Interventions Reviewed, Horticulturist, commercial (DME), Annual Eye Exam  [Encouraged]  Labs Hgb A1c every 3 months, Kidney Function  [Encouraged]  Vaccines COVID-19, Tetanus/Pertussis/Diphtheria, Shingles, RSV, Pneumonia, Flu  [Encouraged]  Doctor Visits Discussed/Reviewed Doctor Visits Discussed, Specialist, Doctor Visits Reviewed, Annual Wellness Visits, PCP  [Encouraged]  Health Screening Mammogram, Colonoscopy, Bone Density  [Encouraged]  Durable Medical Equipment (DME) BP Cuff, Walker  [Encouraged]  PCP/Specialist Visits Compliance with follow-up visit  [Encouraged]  Communication with PCP/Specialists, RN, Pharmacists  [Encouraged]  Level of Care Adult Daycare, Personal Care Services, Applications, Assisted Living, Skilled Nursing Facility  [Encouraged]  Applications [place], Personal Care Services, [place]  [Encouraged]  Exercise Interventions    Exercise Discussed/Reviewed Exercise Discussed, Assistive device use and maintanence, Weight Managment, Exercise Reviewed, Physical Activity  [Encouraged]  Physical Activity Discussed/Reviewed Physical Activity Discussed, Home Exercise Program (HEP), PREP, Gym, Physical Activity Reviewed, Types of exercise  [Encouraged]  Weight Management Weight maintenance  [Encouraged]  Education Interventions   Education Provided Provided Therapist, sports, Provided Web-based Education, Provided Education  [Encouraged]  Provided Verbal Education On Nutrition, Mental Health/Coping with Illness, When to see the doctor, Eye Care, Foot Care, Labs, Blood Sugar Monitoring, Applications, [employer], Exercise, Medication, Insurance Plans  [Encouraged]  Labs Reviewed --  [N/A]  Applications [place], Personal Care Services, Medicaid  [Encouraged]  Mental Health Interventions   Mental Health Discussed/Reviewed Mental Health Discussed, Anxiety, Grief and Loss, Depression, Mental Health Reviewed, Coping Strategies, Substance Abuse, Suicide, Other, Crisis  [Domestic Violence]  Nutrition Interventions   Nutrition Discussed/Reviewed Supplemental nutrition, Decreasing sugar intake, Decreasing salt, Portion sizes, Carbohydrate meal planning, Fluid intake, Nutrition Reviewed, Nutrition Discussed, Adding fruits and vegetables, Increasing proteins, Decreasing fats  [Encouraged]  Pharmacy Interventions   Pharmacy Dicussed/Reviewed Pharmacy Topics Discussed, Medications and their functions, Medication Adherence, Pharmacy Topics Reviewed, Affording Medications  [Encouraged]  Medication Adherence --  [N/A]  Safety Interventions   Safety Discussed/Reviewed Safety Discussed, Fall Risk, Safety Reviewed, Home Safety  [Encouraged]  Home Safety Assistive Devices, Need for home safety assessment, Refer for home visit, Refer for community resources  [Encouraged]  Advanced Directive Interventions   Advanced Directives Discussed/Reviewed  Advanced Directives Discussed  [Encouraged]       Assessed Social Determinant of Health Barriers. Discussed Plans for Ongoing Care Management Follow Up. Provided Careers information officer Information for Care Management Team Members. Screened for Signs & Symptoms of Depression, Related to Chronic Disease State.  PHQ2 & PHQ9 Depression Screen Completed & Results Reviewed.  Suicidal Ideation & Homicidal Ideation Assessed - None Present.   Domestic Violence Assessed - None Present. Access to Weapons Assessed - None Present.   Active Listening & Reflection Utilized.  Verbalization of Feelings Encouraged.  Emotional Support Provided. Symptoms of Grief Validated. Feelings of Anxiousness, Nervousness, & Restlessness Acknowledged. Educated on Proper Use of Deep Breathing Exercises, Relaxation Techniques, & Mindfulness Meditation Strategies, & Encouraged Daily Implementation. Self-Enrollment in Grief & Loss Support Group of Interest Emphasized, from List Provided. Crisis Support Information, Agencies, Services, & Resources Discussed. Problem Solving Interventions Identified. Task-Centered Solutions Implemented.   Solution-Focused Strategies Developed. Acceptance & Commitment Therapy Introduced. Brief Cognitive Behavioral Therapy Initiated. Client-Centered Therapy Enacted. Quality of Sleep Assessed & Sleep Hygiene Techniques Promoted. Confirmed Disinterest in Pursuing Higher Level of Care Placement Options, Denying Need for Assistance with Activities of Daily Living. Reviewed Prescription Medications & Discussed Importance of Compliance. Encouraged Administration of Medications, Exactly as Prescribed. Encouraged Routine Engagement in Activities of Interest, Inside & Outside the Home. Encouraged Increased Level of Activity & Exercise, as Tolerated. Encouraged Self-Enrollment with Psychiatrist of Interest in [facility], from List Provided, to Receive Psychotropic Medication Administration &  Management, in An Effort to Reduce & Manage Symptoms of Depression & Grief. Encouraged Self-Enrollment with Therapist of Interest in [facility], from List Provided, to Receive Psychotherapeutic Counseling & Supportive Services, in An Effort to Reduce & Manage Symptoms of Depression & Grief. Encouraged Engagement with Edd Arbour, Nurse Care Manager with [facility] [phone]), During Follow-Up Outreach Call, Scheduled on 06/03/2023 at 10:30 AM. Encouraged Attendance at Follow-Up Appointments with Starling Manns, Physical Therapist with [facility] at [place] [phone]), Scheduled on 06/03/2023 at 1:00 PM, 06/08/2023 at 2:30 PM, 06/14/2023 at 1:00 PM, 06/15/2023 at 2:30 PM, 06/22/2023 at 2:30 PM, 06/29/2023 at 2:30 PM, & 07/06/2023 at 2:30 PM. Encouraged Attendance at Follow-Up Appointments with Wilhemena Durie, Physical Therapist with [facility] at [place] [phone]), Scheduled on 06/07/2023 at 2:30 PM, 06/21/2023 at 2:30 PM, 06/28/2023 at 2:30 PM, & 07/05/2023 at 2:30 PM. Encouraged Contact with CSW (# [phone]), if You Have Questions, Need Assistance, or If Additional Social Work Needs Are Identified Between Now & Our Next Follow-Up Outreach Call, Scheduled on 06/15/2023 at 3:15 PM. Encouraged Attendance at Follow-Up Appointment with Dr. Syliva Overman, Primary Care Provider with [facility] [phone]# [phone]), Scheduled on 07/19/2023 at 1:40 PM.        SDOH assessments and interventions completed:  Yes.  SDOH Interventions Today    Flowsheet Row Most Recent Value  SDOH Interventions   Food Insecurity Interventions Intervention Not Indicated  Housing Interventions Intervention Not Indicated  Transportation Interventions Intervention Not Indicated, Patient Resources (Friends/Family)  Utilities Interventions Intervention Not Indicated  Alcohol Usage Interventions  Intervention Not Indicated (Score <7)  Financial Strain Interventions Intervention Not Indicated  Physical Activity Interventions Intervention Not Indicated  Stress Interventions Intervention Not Indicated  Social Connections Interventions Intervention Not Indicated  Health Literacy Interventions Intervention Not Indicated     Care Coordination Interventions:  Yes, provided.   Follow up plan: Follow up call scheduled for 06/15/2023 at 3:15 pm.  Encounter Outcome:  Patient Visit Completed.   Danford Bad, BSW, MSW, Printmaker Social Work Case Set designer Health  [facility], Population Health Direct Dial: [phone]  Fax: [phone] Email: [email or web address] [email or web address]

## 2023-06-03 ENCOUNTER — Encounter (HOSPITAL_COMMUNITY): Payer: Medicare HMO

## 2023-06-03 ENCOUNTER — Ambulatory Visit: Payer: Self-pay | Admitting: *Deleted

## 2023-06-03 NOTE — Patient Outreach (Signed)
Care Coordination   Follow Up Visit Note   06/03/2023 Name: [name] MRN: [id number] DOB: 04/25/1934  [name] is a 89 y.o. year old female who sees [name], MD for primary care. I spoke with  [name] by phone today.  What matters to the patients health and wellness today?  "I want to stay independent. I really don't need anything" "I don't want anyone to think I can not care for myself" "I do get lonely though" "I'm competent" "I would like to speak with others more often" "it keeps me on track" "I have accepted that I need to take my pain medication as ordered and I will not get addicted to it"  Home medical management/Memory- She wants to have someone to keep her on track plus continue to write frequent sticky notes as her reminders She had written RN CM name and number down on a sticky note during the last outreach but is now not able to recall where it is    voices concern of her noted memory changes. She has not spoken with her pcp to discuss neurology services related to her memory as she thinks she did not need the services "I think my family think I am to old to be living by myself" She reports speaking with her pcp, grandson & daughter in [place] often Her sister in law called today which made her feel good She reports plans to go to [place] to visit her daughter but was unable to provide a concrete time frame "when I complete most of my outpatient therapy" Her last outpatient rehab PT is listed for 07/06/23  Loss of a friend/Grief She lost a female friend who passed who did not let her how ill he was She refused for RN CM to check for resources for companionship She discusses the loss of her parents She continues to receive outreaches from [place] SW with next follow up on 06/15/23. Per SW notes she has shown a disinterest in higher level of care placement or assist with  Activities of daily living (ADLs). She has been encouraged to sleep well  and to engage in activities outside of her home  Pain management- Concern with "becoming a junkie" She voiced understanding that her pain management providers are aware of managing her medication to prevent addiction. She voices being aware that her arthritis "is not curable" "now my pain is stable because I am now taking the medicine the way was ordered and I am not afraid of them"    Goals Addressed             This Visit's Progress    home management of memory concern & pain management - [facility] care coordination   On track    Patient with receive outreaches from [facility] SW & RN CM Patient will attend all medical appointments Patient will take medications as ordered   Interventions Today    Flowsheet Row Most Recent Value  Chronic Disease   Chronic disease during today's visit --  [pain management, outpatient therapies, memory]  General Interventions   General Interventions Discussed/Reviewed General Interventions Reviewed, Community Resources, Doctor Visits  Doctor Visits Discussed/Reviewed Doctor Visits Reviewed, PCP  PCP/Specialist Visits Compliance with follow-up visit  Exercise Interventions   Exercise Discussed/Reviewed Exercise Reviewed, Physical Activity  Physical Activity Discussed/Reviewed Physical Activity Reviewed  [outpatiet therapy]  Education Interventions   Education Provided Provided Education, Provided Web-based Education  Provided Verbal Education On Mental Health/Coping with Illness, Exercise, Medication, Community  Resources  Mental Health Interventions   Mental Health Discussed/Reviewed Mental Health Reviewed, Coping Strategies, Grief and Loss  Pharmacy Interventions   Pharmacy Dicussed/Reviewed Pharmacy Topics Reviewed, Medications and their functions              SDOH assessments and interventions completed:  No     Care Coordination Interventions:  Yes, provided   Follow up plan: Follow up call scheduled for 07/04/23    Encounter Outcome:   Patient Visit Completed   Cala Bradford L. Noelle Penner, RN, BSN, CCM, Care Management Coordinator [phone]

## 2023-06-03 NOTE — Patient Instructions (Signed)
Visit Information  Thank you for taking time to visit with me today. Please don't hesitate to contact me if I can be of assistance to you.   Following are the goals we discussed today:   Goals Addressed             This Visit's Progress    home management of memory concern & pain management - [facility] care coordination   Not on track    Patient with receive outreaches from [facility] SW & RN CM Patient will attend all medical appointments Patient will take medications as ordered   Interventions Today    Flowsheet Row Most Recent Value  Chronic Disease   Chronic disease during today's visit Other  [memory pain management]  General Interventions   General Interventions Discussed/Reviewed General Interventions Discussed, Programmer, applications, Doctor Visits  Doctor Visits Discussed/Reviewed Doctor Visits Discussed, PCP, Specialist  [discussed her visits to her pcp , inquired about neurology services for memory changes]  PCP/Specialist Visits Compliance with follow-up visit  [Confirm she is very appreciative of her pcp services for her and her family members]  Exercise Interventions   Exercise Discussed/Reviewed Exercise Discussed, Physical Activity  [encouraged]  Physical Activity Discussed/Reviewed Physical Activity Discussed, Types of exercise  [discussed walking, chair exercises, Exercise per PT guidance]  Education Interventions   Education Provided Provided Education, Provided Web-based Education  Provided Verbal Education On Mental Health/Coping with Illness, Exercise, Medication, Community Resources  [encouraged outreaches with [employer] SW for counseling & resources]  Mental Health Interventions   Mental Health Discussed/Reviewed Mental Health Discussed, Coping Strategies, Grief and Loss  Pharmacy Interventions   Pharmacy Dicussed/Reviewed Pharmacy Topics Discussed, Medications and their functions, Affording Medications  Safety Interventions   Safety Discussed/Reviewed Safety Discussed, Fall  Risk, Home Safety  Home Safety Assistive Devices              Our next appointment is by telephone on 06/03/23 at 1100  Please call the care guide team at [phone] if you need to cancel or reschedule your appointment.   If you are experiencing a Mental Health or Behavioral Health Crisis or need someone to talk to, please call the Suicide and Crisis Lifeline: 988 call the [place] National Suicide Prevention Lifeline: 312 511 9656 or TTY: (831)876-0231 TTY (269) 017-5092) to talk to a trained counselor call 1-800-273-TALK (toll free, 24 hour hotline) call the [facility]: 343-612-2193 call 911   Patient verbalizes understanding of instructions and care plan provided today and agrees to view in MyChart. Active MyChart status and patient understanding of how to access instructions and care plan via MyChart confirmed with patient.     The patient has been provided with contact information for the care management team and has been advised to call with any health related questions or concerns.   Emer Onnen L. Noelle Penner, RN, BSN, CCM, Care Management Coordinator [phone]

## 2023-06-03 NOTE — Patient Instructions (Signed)
Visit Information  Thank you for taking time to visit with me today. Please don't hesitate to contact me if I can be of assistance to you.   Following are the goals we discussed today:   Goals Addressed             This Visit's Progress    home management of memory concern & pain management - [facility] care coordination   On track    Patient with receive outreaches from [facility] SW & RN CM Patient will attend all medical appointments Patient will take medications as ordered   Interventions Today    Flowsheet Row Most Recent Value  Chronic Disease   Chronic disease during today's visit --  [pain management, outpatient therapies, memory]  General Interventions   General Interventions Discussed/Reviewed General Interventions Reviewed, Community Resources, Doctor Visits  Doctor Visits Discussed/Reviewed Doctor Visits Reviewed, PCP  PCP/Specialist Visits Compliance with follow-up visit  Exercise Interventions   Exercise Discussed/Reviewed Exercise Reviewed, Physical Activity  Physical Activity Discussed/Reviewed Physical Activity Reviewed  [outpatiet therapy]  Education Interventions   Education Provided Provided Education, Provided Web-based Education  Provided Verbal Education On Mental Health/Coping with Illness, Exercise, Medication, Community Resources  Mental Health Interventions   Mental Health Discussed/Reviewed Mental Health Reviewed, Coping Strategies, Grief and Loss  Pharmacy Interventions   Pharmacy Dicussed/Reviewed Pharmacy Topics Reviewed, Medications and their functions              Our next appointment is by telephone on 07/04/23 at 1100  Please call the care guide team at [phone] if you need to cancel or reschedule your appointment.   If you are experiencing a Mental Health or Behavioral Health Crisis or need someone to talk to, please call the Suicide and Crisis Lifeline: 988 call the [place] National Suicide Prevention Lifeline: 360-273-3096 or TTY:  623 799 8401 TTY 915-661-8370) to talk to a trained counselor call 1-800-273-TALK (toll free, 24 hour hotline) call the [facility]: [phone] call 911   Patient verbalizes understanding of instructions and care plan provided today and agrees to view in MyChart. Active MyChart status and patient understanding of how to access instructions and care plan via MyChart confirmed with patient.     The patient has been provided with contact information for the care management team and has been advised to call with any health related questions or concerns.   Onesty Clair L. Noelle Penner, RN, BSN, CCM, Care Management Coordinator [phone]

## 2023-06-06 DIAGNOSIS — M47816 Spondylosis without myelopathy or radiculopathy, lumbar region: Secondary | ICD-10-CM | POA: Diagnosis not present

## 2023-06-07 ENCOUNTER — Ambulatory Visit (HOSPITAL_COMMUNITY): Payer: Medicare HMO | Attending: Family Medicine

## 2023-06-07 DIAGNOSIS — M6281 Muscle weakness (generalized): Secondary | ICD-10-CM | POA: Diagnosis not present

## 2023-06-07 DIAGNOSIS — M159 Polyosteoarthritis, unspecified: Secondary | ICD-10-CM | POA: Insufficient documentation

## 2023-06-07 DIAGNOSIS — R296 Repeated falls: Secondary | ICD-10-CM | POA: Diagnosis not present

## 2023-06-07 NOTE — Therapy (Signed)
OUTPATIENT PHYSICAL THERAPY NEURO TREATMENT/DISCHARGE PHYSICAL THERAPY DISCHARGE SUMMARY  Visits from Start of Care: 4  Current functional level related to goals / functional outcomes: See below   Remaining deficits: See below   Education / Equipment: HEP   Patient agrees to discharge. Patient goals were partially met. Patient is being discharged due to the patient's request.    Patient Name: [name] MRN: [id number] DOB:Sep 27, 1933, 89 y.o., female Today's Date: 06/07/2023   PCP: Syliva Overman MD REFERRING PROVIDER: Syliva Overman MD  END OF SESSION:  PT End of Session - 06/07/23 1431     Visit Number 4    Number of Visits 12    Date for PT Re-Evaluation 07/01/23    Authorization Type [employer]    Authorization Time Period 27 visit limit between PT/OT    Authorization - Visit Number 3    Progress Note Due on Visit 10    PT Start Time 1431    PT Stop Time 1510    PT Time Calculation (min) 39 min    Equipment Utilized During Treatment Gait belt    Activity Tolerance Patient tolerated treatment well    Behavior During Therapy WFL for tasks assessed/performed               Past Medical History:  Diagnosis Date   Anemia    Blood clot associated with vein wall inflammation    hx of in right leg   CAD (coronary artery disease)    Carpal tunnel syndrome on left    Left Hand pain with numbness, new onset    Chronic back pain    Chronic back pain    buldging disc   Constipation    Diarrhea    Difficult intubation    small airway   DJD (degenerative joint disease)    Severe   Gastric ulcer    hx of    GERD (gastroesophageal reflux disease)    takes Omeprazole daily   Headache(784.0)    occasionally   Hyperlipidemia    takes Lipitor nightly   Hypertension    takes Amlodipine and Metoprolol daily   Nocturia    Obesity    Pneumonia    hx of-in 2011   Shortness of breath    with exertion   Sleep apnea    sleep study done 92yrs  ago   Thyroid mass    right   Past Surgical History:  Procedure Laterality Date   ABDOMINAL HYSTERECTOMY     APPENDECTOMY  1987   Arthoscopy left knee  1992   Arthroscopy left shoulder  1999   Back  Surgery  lumbar  1989 /1999   x 2   BACK SURGERY     CARDIAC CATHETERIZATION  05/2010   CARDIAC CATHETERIZATION N/A 09/30/2015   Procedure: Left Heart Cath and Coronary Angiography;  Surgeon: Lennette Bihari, MD;  Location: [facility];  Service: Cardiovascular;  Laterality: N/A;   Carpal tunnel release right     x 2   CHOLECYSTECTOMY     COLONOSCOPY     ESOPHAGOGASTRODUODENOSCOPY     LUMBAR FUSION  March 19, 2014   NM MYOCAR PERF WALL MOTION  11/12/2008   no ischemia   PARTIAL HYSTERECTOMY  1972   ROTATOR CUFF REPAIR     right   SPINE SURGERY     THYROIDECTOMY  11/17/2011   Procedure: THYROIDECTOMY;  Surgeon: Darletta Moll, MD;  Location: [facility];  Service: ENT;  Laterality:  Right;  WITH FROZEN SECTION   TONSILLECTOMY     Patient Active Problem List   Diagnosis Date Noted   Falls frequently 05/17/2023   Depression, major, single episode, moderate (HCC) 05/17/2023   Forgetfulness 03/16/2023   Vitamin D deficiency 12/10/2022   Grief 12/10/2022   Encounter for Medicare annual examination with abnormal findings 07/27/2022   Chronic pain syndrome 11/20/2021   Long term (current) use of opiate analgesic 11/20/2021   Primary osteoarthritis of right knee 11/20/2021   Lumbar spondylosis 11/20/2021   Sciatica 11/20/2021   Retinoschisis, right 04/21/2020   Cortical age-related cataract of right eye 04/21/2020   Cortical age-related cataract of left eye 04/21/2020   Age-related nuclear cataract of right eye 04/21/2020   Age-related nuclear cataract of left eye 04/21/2020   Angioedema 02/02/2019   Seafood allergy 01/31/2017   Overweight (BMI 25.0-29.9) 10/12/2016   CAD in native artery    Osteoarthritis 04/17/2013   Coronary vasospasm (HCC) 02/07/2013   Hypothyroid 08/14/2012    Prediabetes 10/10/2011   CARPAL TUNNEL SYNDROME, BILATERAL 02/19/2010   GERD 09/15/2009   Coronary atherosclerosis 10/01/2008   DYSPEPSIA 07/07/2008   Back pain of lumbar region with sciatica 07/03/2008   Hyperlipidemia 11/24/2007   Essential hypertension 11/24/2007    ONSET DATE: ~ 6 months ago  REFERRING DIAG:  M15.9 (ICD-10-CM) - Osteoarthritis of multiple joints, unspecified osteoarthritis type  R29.6 (ICD-10-CM) - Falls frequently    THERAPY DIAG:  Generalized osteoarthrosis, involving multiple sites  Falls frequently  Muscle weakness (generalized)  Rationale for Evaluation and Treatment: Rehabilitation  SUBJECTIVE:                                                                                                                                                                                             SUBJECTIVE STATEMENT: I feel about "50%" better  Eval:Pt reports MD recommended some PT due to four back surgeries in Lumbar spine.  grandson reports more conern about the number of falls an leg weakness. Pt is seeing pain management.   Pt lives alone, and does "whatever." Pt still drives and walks to mailbox daily.   Pt accompanied by:  grandson  PERTINENT HISTORY: 4 Lumbar spine surgeries.   PAIN:  Are you having pain? Yes: NPRS scale: 8/10 Pain location: points to lumbar spine Pain description: sharp, sore. Aggravating factors: moving Relieving factors: "when I'm not thinking about it"  PRECAUTIONS: None  RED FLAGS: None   WEIGHT BEARING RESTRICTIONS: No  FALLS: Has patient fallen in last 6 months? Yes. Number of falls 2  LIVING ENVIRONMENT: Lives with: lives alone and family lives close by  Lives in: House/apartment Stairs:  3 steps to patio Has following equipment at home: Quad cane small base  PLOF: Independent  PATIENT GOALS: "get off pain meds"  OBJECTIVE:   COGNITION: Overall cognitive status: Within functional limits for tasks  assessed   SENSATION: WFL   POSTURE: rounded shoulders, forward head, and increased thoracic kyphosis  LOWER EXTREMITY ROM:     Active  Right Eval Left Eval  Hip flexion    Hip extension    Hip abduction    Hip adduction    Hip internal rotation    Hip external rotation    Knee flexion    Knee extension    Ankle dorsiflexion    Ankle plantarflexion    Ankle inversion    Ankle eversion     (Blank rows = not tested)  LOWER EXTREMITY MMT:    MMT Right Eval Left Eval  Hip flexion    Hip extension    Hip abduction    Hip adduction    Hip internal rotation    Hip external rotation    Knee flexion    Knee extension 3+ 3+  Ankle dorsiflexion 3+ 3+  Ankle plantarflexion    Ankle inversion    Ankle eversion    (Blank rows = not tested)  GAIT: Gait pattern: decreased step length- Right, decreased step length- Left, decreased stride length, shuffling, trunk flexed, poor foot clearance- Right, and poor foot clearance- Left Distance walked: 137ft Assistive device utilized: None Level of assistance: Complete Independence Comments: slow, excessive kyphosis during ambulation.   FUNCTIONAL TESTS:  30 seconds chair stand test: 9x Timed up and go (TUG): 17 seconds DGI 1. Gait level surface (2) Mild Impairment: Walks 20', uses assistive devices, slower speed, mild gait deviations. 2. Change in gait speed (2) Mild Impairment: Is able to change speed but demonstrates mild gait deviations, or not gait deviations but unable to achieve a significant change in velocity, or uses an assistive device. 3. Gait with horizontal head turns (2) Mild Impairment: Performs head turns smoothly with slight change in gait velocity, i.e., minor disruption to smooth gait path or uses walking aid. 4. Gait with vertical head turns (2) Mild Impairment: Performs head turns smoothly with slight change in gait velocity, i.e., minor disruption to smooth gait path or uses walking aid. 5. Gait and pivot  turn (3) Normal: Pivot turns safely within 3 seconds and stops quickly with no loss of balance. 6. Step over obstacle (0) Severe Impairment: Cannot perform without assistance. 7. Step around obstacles (2) Mild Impairment: Is able to step around both cones, but must slow down and adjust steps to clear cones. 8. Stairs (1) Moderate Impairment: Two feet to a stair, must use rail.  TOTAL SCORE: 14 / 24  PATIENT SURVEYS:  ABC scale 51.875% confidence  TODAY'S TREATMENT:  DATE:  06/07/23 Discharge note 30 sec sit to stand 8 reps TUG 17.45 sec without assistive device ABC score 75.6% DGI 1. Gait level surface (2) Mild Impairment: Walks 20', uses assistive devices, slower speed, mild gait deviations. 2. Change in gait speed (2) Mild Impairment: Is able to change speed but demonstrates mild gait deviations, or not gait deviations but unable to achieve a significant change in velocity, or uses an assistive device. 3. Gait with horizontal head turns (2) Mild Impairment: Performs head turns smoothly with slight change in gait velocity, i.e., minor disruption to smooth gait path or uses walking aid. 4. Gait with vertical head turns (2) Mild Impairment: Performs head turns smoothly with slight change in gait velocity, i.e., minor disruption to smooth gait path or uses walking aid. 5. Gait and pivot turn (3) Normal: Pivot turns safely within 3 seconds and stops quickly with no loss of balance. 6. Step over obstacle (1) Moderate Impairment: Is able to step over box but must stop, then step over. May require verbal cueing. 7. Step around obstacles (3) Normal: Is able to walk around cones safely without changing gait speed; no evidence of imbalance. 8. Stairs (2) Mild Impairment: Alternating feet, must use rail.  TOTAL SCORE: 17 / 24   05/31/2023  -Standing static balance  on foam pad 3 x 1' -Standing foam pad marching single UE support 3x30' -1x30' tandem stance w/o UE support -Standing Rows 3x15 YTB CGA for balance; -Stepping balance reactions over black line 1 x 10 bilaterally; CGA, min assist for one LOB laterally.  -Standing calf stretch 2 x 1'   05/27/2023  -3x30' tandem stance with no UE support; CGA for maintaining balance with intermittent UE touchdown on parallel bars -Seated shoulder horizontal abduction 3 x 12 with YTB- postural training -Sit/stands 2 x 10 with yellow theraball with blue foam on seat. -Side stepping in parallel bars 3ft x 5 with YTB around knees- CGA level.    05/20/2023 PT Evaluation, findings, prognosis, frequency.     PATIENT EDUCATION: Education details: PT Evaluation, findings, prognosis, frequency Person educated: Patient Education method: Medical illustrator Education comprehension: verbalized understanding  HOME EXERCISE PROGRAM: Access Code: [email or web address] URL: [email or web address] Date: 05/27/2023 Prepared by: Starling Manns  Exercises - Standing Tandem Balance with Counter Support  - 1 x daily - 7 x weekly - 3 sets - 10 reps - Seated Shoulder Horizontal Abduction with Resistance  - 1 x daily - 7 x weekly - 3 sets - 10 reps - Squat with Chair Touch  - 1 x daily - 7 x weekly - 3 sets - 10 reps  GOALS: Goals reviewed with patient? No  SHORT TERM GOALS: Target date: 06/17/2023  Pt will be independent with HEP in order to demonstrate participation in Physical Therapy POC.  Baseline: Goal status: met  2.  Pt will report > 25% improvement in subjective safety/balance concerns in order to demonstrate improved self awareness of balance deficits.  Baseline:  Goal status: met  LONG TERM GOALS: Target date: 07/15/2023  Pt will increase DGI score by > 8 in order to demonstrate improved functional safety and balance skills in ADL/mobility.   Baseline: 17/24 06/07/23 (improved by 3  points) Goal status: INITIAL  2.  Pt will improve TUG by > 3 seconds in order to demonstrate improved functional mobility capacity in community setting.  Baseline: see objective; see above Goal status: INITIAL  3.  Pt will improve ABC score by MCID in order to demonstrate improved  pain with functional goals and outcomes. Baseline: see objective Goal status:met 75.6%  4.  Pt will report > 50% improvement in subjective safety/balance concerns in order to demonstrate improved self awareness of balance deficits..  Baseline:  Goal status: met  ASSESSMENT:  CLINICAL IMPRESSION: Discharge today per patient request.  She has met 4/6 set rehab goals at this time.   Pt would benefit from skilled PT services to address deficits and improve safety with functional activities. .   OBJECTIVE IMPAIRMENTS: Abnormal gait, decreased activity tolerance, decreased balance, decreased mobility, difficulty walking, decreased ROM, decreased strength, and pain.   ACTIVITY LIMITATIONS: carrying, lifting, bending, sitting, standing, squatting, stairs, transfers, and locomotion level  PARTICIPATION LIMITATIONS: shopping, community activity, occupation, and yard work  PERSONAL FACTORS: Age are also affecting patient's functional outcome.   REHAB POTENTIAL: Good  CLINICAL DECISION MAKING: Stable/uncomplicated  EVALUATION COMPLEXITY: Low  PLAN:  PT FREQUENCY: 2x/week  PT DURATION: 6 weeks  PLANNED INTERVENTIONS: Therapeutic exercises, Therapeutic activity, Neuromuscular re-education, Balance training, Gait training, Patient/Family education, Self Care, Joint mobilization, Manual therapy, and Re-evaluation  PLAN FOR NEXT SESSION: BLE strengthening, calf stretching, postural awareness. UE exercises for posture.   3:07 PM, 06/07/23 Chanele Douglas Small Avis Tirone MPT [facility] [place] [phone]

## 2023-06-08 ENCOUNTER — Encounter (HOSPITAL_COMMUNITY): Payer: Medicare HMO

## 2023-06-14 ENCOUNTER — Encounter (HOSPITAL_COMMUNITY): Payer: Medicare HMO

## 2023-06-15 ENCOUNTER — Encounter (HOSPITAL_COMMUNITY): Payer: Medicare HMO

## 2023-06-15 ENCOUNTER — Ambulatory Visit: Payer: Self-pay | Admitting: *Deleted

## 2023-06-15 NOTE — Patient Outreach (Signed)
Care Coordination   Follow Up Visit Note   06/15/2023  Name: [name] MRN: [id number] DOB: [birth year]  Bora Densley is a 89 y.o. year old female who sees [name], MD for primary care. I spoke with [name] by phone today.  What matters to the patients health and wellness today?  Assess Need for Social Work Involvement.    Goals Addressed               This Visit's Progress     Assess Need for Social Work Involvement. (pt-stated)   On track     Care Coordination Intervention:  Interventions Today    Flowsheet Row Most Recent Value  Chronic Disease   Chronic disease during today's visit Other, Hypertension (HTN)  [Pre-diabetes, Unsteady Balance/Gait, Frequent Falls, Forgetfulness, Osteoarthritis, Depression, Grief, Chronic Pain Syndrome]  General Interventions   General Interventions Discussed/Reviewed General Interventions Discussed, Labs, Vaccines, Referral to Nurse, Health Screening, Doctor Visits, Annual Foot Exam, Lipid Profile, Communication with, Level of Care, [employer], General Interventions Reviewed, Horticulturist, commercial (DME), Annual Eye Exam  [Encouraged]  Labs Hgb A1c every 3 months, Kidney Function  [Encouraged]  Vaccines COVID-19, Tetanus/Pertussis/Diphtheria, Shingles, RSV, Pneumonia, Flu  [Encouraged]  Doctor Visits Discussed/Reviewed Doctor Visits Discussed, Specialist, Doctor Visits Reviewed, Annual Wellness Visits, PCP  [Encouraged]  Health Screening Mammogram, Colonoscopy, Bone Density  [Encouraged]  Durable Medical Equipment (DME) BP Cuff, Walker  [Encouraged]  PCP/Specialist Visits Compliance with follow-up visit  [Encouraged]  Communication with PCP/Specialists, RN, Pharmacists  [Encouraged]  Level of Care Adult Daycare, Personal Care Services, Applications, Assisted Living, Skilled Nursing Facility  [Encouraged]  Applications [place], Personal Care Services, [place]  [Encouraged]  Exercise Interventions    Exercise Discussed/Reviewed Exercise Discussed, Assistive device use and maintanence, Weight Managment, Exercise Reviewed, Physical Activity  [Encouraged]  Physical Activity Discussed/Reviewed Physical Activity Discussed, Home Exercise Program (HEP), PREP, Gym, Physical Activity Reviewed, Types of exercise  [Encouraged]  Weight Management Weight maintenance  [Encouraged]  Education Interventions   Education Provided Provided Therapist, sports, Provided Web-based Education, Provided Education  [Encouraged]  Provided Verbal Education On Nutrition, Mental Health/Coping with Illness, When to see the doctor, Eye Care, Foot Care, Labs, Blood Sugar Monitoring, Applications, [employer], Exercise, Medication, Insurance Plans  [Encouraged]  Labs Reviewed --  [N/A]  Applications [place], Personal Care Services, Medicaid  [Encouraged]  Mental Health Interventions   Mental Health Discussed/Reviewed Mental Health Discussed, Anxiety, Grief and Loss, Depression, Mental Health Reviewed, Coping Strategies, Substance Abuse, Suicide, Other, Crisis  [Domestic Violence]  Nutrition Interventions   Nutrition Discussed/Reviewed Supplemental nutrition, Decreasing sugar intake, Decreasing salt, Portion sizes, Carbohydrate meal planning, Fluid intake, Nutrition Reviewed, Nutrition Discussed, Adding fruits and vegetables, Increasing proteins, Decreasing fats  [Encouraged]  Pharmacy Interventions   Pharmacy Dicussed/Reviewed Pharmacy Topics Discussed, Medications and their functions, Medication Adherence, Pharmacy Topics Reviewed, Affording Medications  [Encouraged]  Medication Adherence --  [N/A]  Safety Interventions   Safety Discussed/Reviewed Safety Discussed, Fall Risk, Safety Reviewed, Home Safety  [Encouraged]  Home Safety Assistive Devices, Need for home safety assessment, Refer for home visit, Refer for community resources  [Encouraged]  Advanced Directive Interventions   Advanced Directives Discussed/Reviewed  Advanced Directives Discussed  [Encouraged]      Active Listening & Reflection Utilized.  Verbalization of Feelings Encouraged.  Emotional Support Provided. Problem Solving Interventions Employed.   Solution-Focused Strategies Initiated. Acceptance & Commitment Therapy Indicated. Cognitive Behavioral Therapy Conducted Client-Centered Therapy Performed. Encouraged Daily Implementation of Deep Breathing Exercises, Relaxation Techniques, & Mindfulness  Meditation Strategies. Encouraged Administration of Medications, Exactly as Prescribed. Encouraged Routine Engagement in Activities of Interest, Inside & Outside the Home. Encouraged Increased Level of Activity & Exercise, as Tolerated. Encouraged Journaling, As a Means to Express Thoughts, Feelings, & Emotions. Encouraged Review of Educational Material, "Loneliness & Isolation: Recruitment consultant", & Be Prepared to Discuss During Our Next Scheduled Follow-Up [employer]. Encouraged Self-Enrollment with Psychiatrist of Interest in [facility], from List Provided, to Receive Psychotropic Medication Administration & Management, in An Effort to Reduce & Manage Symptoms of Depression & Grief. Encouraged Self-Enrollment with Therapist of Interest in [facility], from List Provided, to Receive Psychotherapeutic Counseling & Supportive Services, in An Effort to Reduce & Manage Symptoms of Depression & Grief. Encouraged Self-Enrollment with Grief & Loss Support Group of Interest in [facility], from List Provided, to [employer], Cytogeneticist, Resources, Education, Sales promotion account executive, Etc., in An Effort to Reduce & Manage Symptoms of Depression & Grief. Encouraged Engagement with [name] to [employer] To & From Physician Appointments & Provider Practice Visits. Encouraged Engagement with Edd Arbour, Nurse Care Manager with [facility] [phone]), During Follow-Up Outreach Call, Scheduled on  07/04/2023 at 11:00 AM. Encouraged Engagement with Danford Bad, Social Work Case Manager with [facility] [phone]), if You Have Questions, Need Assistance, or If Additional Social Work Needs Are Identified Between Now & Our Next Follow-Up Outreach Call, Scheduled on 07/15/2023 at 1:30 PM. Encouraged Attendance at Follow-Up Appointment with Dr. Syliva Overman, Primary Care Provider with [facility] Primary [facility] [phone]# [phone]), Scheduled on 07/19/2023 at 1:40 PM.      SDOH assessments and interventions completed:  Yes.  Care Coordination Interventions:  Yes, provided.   Follow up plan: Follow up call scheduled for 07/15/2023 at 1:30 pm.  Encounter Outcome:  Patient Visit Completed.   Danford Bad, BSW, MSW, Printmaker Social Work Case Set designer Health  [facility], Population Health Direct Dial: [phone]  Fax: [phone] Email: [email or web address]
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
## 2023-06-21 ENCOUNTER — Encounter (HOSPITAL_COMMUNITY): Payer: Medicare HMO

## 2023-06-22 ENCOUNTER — Encounter (HOSPITAL_COMMUNITY): Payer: Medicare HMO

## 2023-06-22 DIAGNOSIS — M47816 Spondylosis without myelopathy or radiculopathy, lumbar region: Secondary | ICD-10-CM | POA: Diagnosis not present

## 2023-06-22 DIAGNOSIS — Z79891 Long term (current) use of opiate analgesic: Secondary | ICD-10-CM | POA: Diagnosis not present

## 2023-06-22 DIAGNOSIS — M545 Low back pain, unspecified: Secondary | ICD-10-CM | POA: Diagnosis not present

## 2023-06-22 DIAGNOSIS — G894 Chronic pain syndrome: Secondary | ICD-10-CM | POA: Diagnosis not present

## 2023-06-28 ENCOUNTER — Encounter (HOSPITAL_COMMUNITY): Payer: Medicare HMO

## 2023-06-29 ENCOUNTER — Encounter (HOSPITAL_COMMUNITY): Payer: Medicare HMO

## 2023-07-04 ENCOUNTER — Ambulatory Visit: Payer: Self-pay | Admitting: *Deleted

## 2023-07-04 NOTE — Patient Instructions (Addendum)
Visit Information  Thank you for taking time to visit with me today. Please don't hesitate to contact me if I can be of assistance to you.   Following are the goals we discussed today:   Goals Addressed             This Visit's Progress    home management of memory concern & pain management - [facility] care coordination       Patient with receive outreaches from [facility] SW & RN CM Patient will attend all medical appointments Patient will take medications as ordered   Interventions Today    Flowsheet Row Most Recent Value  Chronic Disease   Chronic disease during today's visit Hypertension (HTN), Other  [osteoarthritis pain management, memory changes]  General Interventions   General Interventions Discussed/Reviewed General Interventions Reviewed, Doctor Visits  Doctor Visits Discussed/Reviewed Doctor Visits Reviewed, PCP  PCP/Specialist Visits Compliance with follow-up visit  Education Interventions   Provided Verbal Education On Other, Medication, When to see the doctor  [memory change progression, aneurysm worsening symptoms]  Nutrition Interventions   Nutrition Discussed/Reviewed Nutrition Discussed, Fluid intake  Pharmacy Interventions   Pharmacy Dicussed/Reviewed Pharmacy Topics Reviewed, Medications and their functions, Affording Medications  Safety Interventions   Safety Discussed/Reviewed Safety Reviewed, Fall Risk, Home Safety  Home Safety Assistive Devices              Our next appointment is by telephone on 07/15/23 at 3 pm   Please call the care guide team at [phone] if you need to cancel or reschedule your appointment.   If you are experiencing a Mental Health or Behavioral Health Crisis or need someone to talk to, please call the Suicide and Crisis Lifeline: 988 call the [place] National Suicide Prevention Lifeline: 920-816-9689 or TTY: (956)321-7050 TTY 8622543273) to talk to a trained counselor call 1-800-273-TALK (toll free, 24 hour hotline) call the  [facility]: [phone] call 911   Print copy of patient instructions, educational materials, and care plan provided in person.   The patient has been provided with contact information for the care management team and has been advised to call with any health related questions or concerns.   Ludy Messamore L. Noelle Penner, RN, BSN, [facility]  VBCI Care Management Coordinator  [phone]  Fax: [phone]

## 2023-07-04 NOTE — Patient Outreach (Signed)
  Care Coordination   Follow Up Visit Note   08/19/2023 late update for 07/04/23 Name: [name] MRN: [id number] DOB: 1934-06-12  [name] is a 89 y.o. year old female who sees Kaitlyn Perches, MD for primary care. I spoke with  [name] by phone today.  What matters to the patients health and wellness today?  "I am losing some of memory",+ problems with her back,  Does not want to be dependent on others   She spoke about her family member [name] & her visiting her daughter, [name] to [facility], Not wanting to move to [facility]  yet She is aware she is getting like her parents + [name]/[name] but not  Voices understanding of different levels of memory changes She is noted to repeat information today- about her father, [name]  Concerned that her mother passed at age 7 (aneurysm/ventilator) and she is now 73 since 06/17/2023 the same age of her mother She confirms these feelings are NOT extremely distressing and interfere with her daily life She voices understanding of her maybe experiencing death anxiety or thanatophobia She voices understanding that if she becomes unable to bathe, dress, feeding self this is when she may need to live with family She gave permission for [name] to outreach to RN CM or Social workers  Pain management treatment  Severe osteoarthritis cared for by pain management clinic "It is going okayScientist, clinical (histocompatibility and immunogenetics), [name],  has patience and talks to me Grandson visits her every day & he drives for her. He lives near her  She walks to get her mail box and tries to stay as active  Wrote down her 07/19/23 pcp visit at 1:40 pm     Goals Addressed             This Visit's Progress    home management of memory concern & pain management - [facility]BCI care coordination       Patient with receive outreaches from [facility] SW & RN CM Patient will attend all medical appointments Patient will take medications as ordered   Interventions Today    Flowsheet Row Most  Recent Value  Chronic Disease   Chronic disease during today's visit Hypertension (HTN), Other  [osteoarthritis pain management, memory changes]  General Interventions   General Interventions Discussed/Reviewed General Interventions Reviewed, Doctor Visits  Doctor Visits Discussed/Reviewed Doctor Visits Reviewed, PCP  PCP/Specialist Visits Compliance with follow-up visit  Education Interventions   Provided Verbal Education On Other, Medication, When to see the doctor  [memory change progression, aneurysm worsening symptoms]  Nutrition Interventions   Nutrition Discussed/Reviewed Nutrition Discussed, Fluid intake  Pharmacy Interventions   Pharmacy Dicussed/Reviewed Pharmacy Topics Reviewed, Medications and their functions, Affording Medications  Safety Interventions   Safety Discussed/Reviewed Safety Reviewed, Fall Risk, Home Safety  Home Safety Assistive Devices              SDOH assessments and interventions completed:  No     Care Coordination Interventions:  Yes, provided   Follow up plan: Follow up call scheduled for 07/15/23    Encounter Outcome:  Patient Visit Completed   Kaitlyn Bradford L. Noelle Penner, RN, BSN, [facility]  VBCI Care Management Coordinator  [phone]  Fax: [phone]

## 2023-07-05 ENCOUNTER — Encounter (HOSPITAL_COMMUNITY): Payer: Medicare HMO

## 2023-07-06 ENCOUNTER — Encounter (HOSPITAL_COMMUNITY): Payer: Medicare HMO

## 2023-07-15 ENCOUNTER — Ambulatory Visit: Payer: Self-pay | Admitting: *Deleted
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
## 2023-07-15 NOTE — Patient Outreach (Signed)
  Care Coordination   07/15/2023 Name: Aarielle Urbanovsky MRN: [id number] DOB: 1933/09/15   Care Coordination Outreach Attempts:  An unsuccessful telephone outreach was attempted today to offer the patient information about available care coordination services.  Follow Up Plan:  Additional outreach attempts will be made to offer the patient care coordination information and services.   Encounter Outcome:  No Answer   Care Coordination Interventions:  Yes, provided Patient home number busy x 2 Left message for grandson, Carollee Sires. Noelle Penner, RN, BSN, [facility]  VBCI Care Management Coordinator  [phone]  Fax: [phone]

## 2023-07-19 ENCOUNTER — Ambulatory Visit: Payer: Medicare HMO | Admitting: Family Medicine

## 2023-07-20 DIAGNOSIS — Z79891 Long term (current) use of opiate analgesic: Secondary | ICD-10-CM | POA: Diagnosis not present

## 2023-07-20 DIAGNOSIS — M545 Low back pain, unspecified: Secondary | ICD-10-CM | POA: Diagnosis not present

## 2023-07-20 DIAGNOSIS — M47816 Spondylosis without myelopathy or radiculopathy, lumbar region: Secondary | ICD-10-CM | POA: Diagnosis not present

## 2023-07-20 DIAGNOSIS — G894 Chronic pain syndrome: Secondary | ICD-10-CM | POA: Diagnosis not present

## 2023-08-10 ENCOUNTER — Other Ambulatory Visit: Payer: Self-pay | Admitting: *Deleted

## 2023-08-12 ENCOUNTER — Encounter: Payer: Self-pay | Admitting: *Deleted

## 2023-08-12 ENCOUNTER — Ambulatory Visit: Payer: Self-pay | Admitting: *Deleted

## 2023-08-12 NOTE — Patient Instructions (Signed)
Visit Information  Thank you for taking time to visit with me today. Please don't hesitate to contact me if I can be of assistance to you.   Following are the goals we discussed today:   Goals Addressed               This Visit's Progress     Receive Counseling & Supportive Services to Reduce & Manage Symptoms of Depression, Grief & Loss. (pt-stated)   On track     Care Coordination Intervention:  Interventions Today    Flowsheet Row Most Recent Value  Chronic Disease   Chronic disease during today's visit Hypertension (HTN), Other  [Pre-diabetes, Unsteady Balance/Gait, Frequent Falls, Forgetfulness, Osteoarthritis, Depression, Grief, Chronic Pain Syndrome]  General Interventions   General Interventions Discussed/Reviewed General Interventions Discussed, General Interventions Reviewed, Communication with, Doctor Visits, Community Resources, Level of Care  [Commuication with Care Team Members.]  Doctor Visits Discussed/Reviewed Doctor Visits Discussed, Doctor Visits Reviewed, Annual Wellness Visits, PCP, Specialist  [Encouraged Routine Engagement.]  PCP/Specialist Visits Compliance with follow-up visit  [Encouraged Routine Engagement.]  Communication with PCP/Specialists, Charity fundraiser, Pharmacists, Social Work  [employer] Routine Engagement.]  Level of Care Adult Daycare, Air traffic controller, Assisted Living, Skilled Nursing Facility  [Confirmed Disinterest in Enrollment in Adult Day Care Program or Receiving Assistance with Pursuing Higher Level of Care Placement Options (I.e Assisted Living or Skilled Nursing Facility).]  Applications Medicaid, Personal Care Services  [Confirmed Disinterest in Applying for Medicaid or Personal Care Services.]  Education Interventions   Education Provided Provided Education  [Encouraged Review & Implementation.]  Provided Verbal Education On [employer], Mental Health/Coping with Illness, When to see the doctor, Applications, Insurance Plans  [Encouraged  Consideration of Topics Discussed.]  Applications Medicaid, Personal Care Services  [Confirmed Disinterest in Applying for Medicaid or Personal Care Services.]  Mental Health Interventions   Mental Health Discussed/Reviewed Mental Health Discussed, Grief and Loss, Substance Abuse, Mental Health Reviewed, Suicide, Coping Strategies, Other, Crisis, Anxiety, Depression  [Assessed Mental Health & Cognitive Status.]  Safety Interventions   Safety Discussed/Reviewed Safety Discussed, Safety Reviewed, Fall Risk, Home Safety  [Encouraged Consideration of Home Safety Evaluation.]  Home Safety Assistive Devices, Need for home safety assessment, Refer for community resources  [Encouraged Routine Use of Assistive Devices.]  Advanced Directive Interventions   Advanced Directives Discussed/Reviewed Advanced Directives Discussed, Advanced Directives Reviewed  [Encouraged Initiation of Advanced Directives, Offering to [employer] & Assist with Completion.]      Active Listening & Reflection Utilized.  Verbalization of Feelings Encouraged.  Emotional Support Provided. Acceptance & Commitment Therapy Implemented. Cognitive Behavioral Therapy Initiated. Client-Centered Therapy Performed. Encouraged Administration of Medications Exactly as Prescribed. Encouraged Routine Engagement with Danford Bad, Licensed Clinical Social Worker with [facility] [phone]), if You Have Questions, Need Assistance, or If Additional Social Work Needs Are Identified Between Now & Our Next Follow-Up Outreach Call, Scheduled on 09/09/2023 at 9:00 AM.      Our next appointment is by telephone on 09/08/2022 at 9:00 am.  Please call the care guide team at [phone] if you need to cancel or reschedule your appointment.   If you are experiencing a Mental Health or Behavioral Health Crisis or need someone to talk to, please call the Suicide and Crisis Lifeline: 988 call the [place] National Suicide Prevention  Lifeline: 617-283-1097 or TTY: 316-322-7936 TTY (870)185-9051) to talk to a trained counselor call 1-800-273-TALK (toll free, 24 hour hotline) go to [facility] [address], [place] [phone]) call  the [facility]: [phone] call 911  Patient verbalizes understanding of instructions and care plan provided today and agrees to view in MyChart. Active MyChart status and patient understanding of how to access instructions and care plan via MyChart confirmed with patient.     Telephone follow up appointment with care management team member scheduled for:  09/08/2022 at 9:00 am.  Danford Bad, BSW, MSW, LCSW  Embedded Practice Social Work Case Manager  [facility], Population Health Direct Dial: [phone]  Fax: [phone] Email: [email or web address]

## 2023-08-12 NOTE — Patient Outreach (Signed)
Care Coordination   Follow Up Visit Note   08/12/2023  Name: [name] MRN: [id number] DOB: 1933-10-01  [name] is a 89 y.o. year old female who sees Kaitlyn Perches, MD for primary care. I spoke with [name] by phone today.  What matters to the patients health and wellness today?  Receive Counseling & Supportive Services to Reduce & Manage Symptoms of Depression, Grief & Loss.    Goals Addressed               This Visit's Progress     Receive Counseling & Supportive Services to Reduce & Manage Symptoms of Depression, Grief & Loss. (pt-stated)   On track     Care Coordination Intervention:  Interventions Today    Flowsheet Row Most Recent Value  Chronic Disease   Chronic disease during today's visit Hypertension (HTN), Other  [Pre-diabetes, Unsteady Balance/Gait, Frequent Falls, Forgetfulness, Osteoarthritis, Depression, Grief, Chronic Pain Syndrome]  General Interventions   General Interventions Discussed/Reviewed General Interventions Discussed, General Interventions Reviewed, Communication with, Doctor Visits, Community Resources, Level of Care  [Commuication with Care Team Members.]  Doctor Visits Discussed/Reviewed Doctor Visits Discussed, Doctor Visits Reviewed, Annual Wellness Visits, PCP, Specialist  [Encouraged Routine Engagement.]  PCP/Specialist Visits Compliance with follow-up visit  [Encouraged Routine Engagement.]  Communication with PCP/Specialists, Charity fundraiser, Pharmacists, Social Work  [employer] Routine Engagement.]  Level of Care Adult Daycare, Air traffic controller, Assisted Living, Skilled Nursing Facility  [Confirmed Disinterest in Enrollment in Adult Day Care Program or Receiving Assistance with Pursuing Higher Level of Care Placement Options (I.e Assisted Living or Skilled Nursing Facility).]  Applications Medicaid, Personal Care Services  [Confirmed Disinterest in Applying for Medicaid or Personal Care Services.]  Education Interventions    Education Provided Provided Education  [Encouraged Review & Implementation.]  Provided Verbal Education On [employer], Mental Health/Coping with Illness, When to see the doctor, Applications, Insurance Plans  [Encouraged Consideration of Topics Discussed.]  Applications Medicaid, Personal Care Services  [Confirmed Disinterest in Applying for Medicaid or Personal Care Services.]  Mental Health Interventions   Mental Health Discussed/Reviewed Mental Health Discussed, Grief and Loss, Substance Abuse, Mental Health Reviewed, Suicide, Coping Strategies, Other, Crisis, Anxiety, Depression  [Assessed Mental Health & Cognitive Status.]  Safety Interventions   Safety Discussed/Reviewed Safety Discussed, Safety Reviewed, Fall Risk, Home Safety  [Encouraged Consideration of Home Safety Evaluation.]  Home Safety Assistive Devices, Need for home safety assessment, Refer for community resources  [Encouraged Routine Use of Assistive Devices.]  Advanced Directive Interventions   Advanced Directives Discussed/Reviewed Advanced Directives Discussed, Advanced Directives Reviewed  [Encouraged Initiation of Advanced Directives, Offering to [employer] & Assist with Completion.]      Active Listening & Reflection Utilized.  Verbalization of Feelings Encouraged.  Emotional Support Provided. Acceptance & Commitment Therapy Implemented. Cognitive Behavioral Therapy Initiated. Client-Centered Therapy Performed. Encouraged Administration of Medications Exactly as Prescribed. Encouraged Routine Engagement with [name], Licensed Clinical Social Worker with [facility] [phone]), if You Have Questions, Need Assistance, or If Additional Social Work Needs Are Identified Between Now & Our Next Follow-Up Outreach Call, Scheduled on 09/09/2023 at 9:00 AM.        SDOH assessments and interventions completed:  Yes.  SDOH Interventions Today    Flowsheet Row Most Recent Value  SDOH  Interventions   Food Insecurity Interventions Intervention Not Indicated  Housing Interventions Intervention Not Indicated  Transportation Interventions Intervention Not Indicated, Patient Resources (Friends/Family)  Utilities Interventions Intervention Not Indicated  Alcohol Usage Interventions Intervention Not  Indicated (Score <7)  Depression Interventions/Treatment  Referral to Psychiatry, Medication, Counseling, Currently on Treatment  Financial Strain Interventions Intervention Not Indicated  Physical Activity Interventions Intervention Not Indicated  Stress Interventions Offered [employer] Resources, Provide Counseling  Social Connections Interventions Intervention Not Indicated  Health Literacy Interventions Intervention Not Indicated     Care Coordination Interventions:  Yes, provided.   Follow up plan: Follow up call scheduled for 09/08/2022 at 9:00 am.  Encounter Outcome:  Patient Visit Completed.   [name], BSW, MSW, Printmaker Social Work Case Set designer Health  [facility], Population Health Direct Dial: [phone]  Fax: [phone] Email: [email or web address]

## 2023-08-17 ENCOUNTER — Other Ambulatory Visit: Payer: Self-pay

## 2023-08-17 DIAGNOSIS — G894 Chronic pain syndrome: Secondary | ICD-10-CM | POA: Diagnosis not present

## 2023-08-17 DIAGNOSIS — M47816 Spondylosis without myelopathy or radiculopathy, lumbar region: Secondary | ICD-10-CM | POA: Diagnosis not present

## 2023-08-17 DIAGNOSIS — M545 Low back pain, unspecified: Secondary | ICD-10-CM | POA: Diagnosis not present

## 2023-08-17 DIAGNOSIS — Z79891 Long term (current) use of opiate analgesic: Secondary | ICD-10-CM | POA: Diagnosis not present

## 2023-08-17 MED ORDER — NITROGLYCERIN 0.4 MG/SPRAY TL SOLN: 1.0000 | 0 refills | Status: DC | PRN

## 2023-08-18 ENCOUNTER — Ambulatory Visit: Payer: Medicare HMO | Admitting: Family Medicine

## 2023-08-19 ENCOUNTER — Encounter: Payer: Self-pay | Admitting: Family Medicine

## 2023-08-19 ENCOUNTER — Ambulatory Visit (INDEPENDENT_AMBULATORY_CARE_PROVIDER_SITE_OTHER): Payer: Medicare HMO | Admitting: Family Medicine

## 2023-08-19 VITALS — BP 148/88 | HR 88 | Ht 63.0 in | Wt 135.0 lb

## 2023-08-19 DIAGNOSIS — E039 Hypothyroidism, unspecified: Secondary | ICD-10-CM | POA: Diagnosis not present

## 2023-08-19 DIAGNOSIS — D511 Vitamin B12 deficiency anemia due to selective vitamin B12 malabsorption with proteinuria: Secondary | ICD-10-CM | POA: Diagnosis not present

## 2023-08-19 DIAGNOSIS — R7303 Prediabetes: Secondary | ICD-10-CM

## 2023-08-19 DIAGNOSIS — F4321 Adjustment disorder with depressed mood: Secondary | ICD-10-CM | POA: Diagnosis not present

## 2023-08-19 DIAGNOSIS — E559 Vitamin D deficiency, unspecified: Secondary | ICD-10-CM

## 2023-08-19 DIAGNOSIS — M5440 Lumbago with sciatica, unspecified side: Secondary | ICD-10-CM | POA: Diagnosis not present

## 2023-08-19 DIAGNOSIS — I10 Essential (primary) hypertension: Secondary | ICD-10-CM

## 2023-08-19 DIAGNOSIS — E785 Hyperlipidemia, unspecified: Secondary | ICD-10-CM

## 2023-08-19 DIAGNOSIS — R6889 Other general symptoms and signs: Secondary | ICD-10-CM | POA: Diagnosis not present

## 2023-08-19 NOTE — Patient Instructions (Addendum)
F/U in 8 to 10 weeks, call if you need me sooner  Fasting lipid, cmp and EGr, cBC, B12, tsh and vit D 3  to 5 days before next visit  PLease bring medications to your next appointment  Remember you  are turning over to a NEW PAGE in the nEW YEAR!  Best for 2025!  Thanks for choosing [facility], we consider it a privelige to serve you.
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
## 2023-08-22 DIAGNOSIS — D511 Vitamin B12 deficiency anemia due to selective vitamin B12 malabsorption with proteinuria: Secondary | ICD-10-CM | POA: Insufficient documentation

## 2023-08-22 NOTE — Assessment & Plan Note (Signed)
Updated lab needed at/ before next visit.   

## 2023-08-22 NOTE — Assessment & Plan Note (Signed)
Sub optimal control, need to review medications currently taking, will bring to visit  , will also rach out to [facility] nurse for help

## 2023-08-22 NOTE — Assessment & Plan Note (Signed)
Hyperlipidemia:Low fat diet discussed and encouraged.   Lipid Panel  Lab Results  Component Value Date   CHOL 194 12/10/2022   HDL 54 12/10/2022   LDLCALC 121 (H) 12/10/2022   TRIG 106 12/10/2022   CHOLHDL 3.6 12/10/2022     Updated lab needed at/ before next visit.

## 2023-08-22 NOTE — Assessment & Plan Note (Signed)
Patient educated about the importance of limiting  Carbohydrate intake , the need to commit to daily physical activity for a minimum of 30 minutes , and to commit weight loss. The fact that changes in all these areas will reduce or eliminate all together the development of diabetes is stressed.      Latest Ref Rng & Units 05/10/2023    2:34 PM 12/10/2022   10:30 AM 12/18/2021   11:46 AM 07/23/2021   12:14 PM 01/20/2021   11:37 AM  Diabetic Labs  Chol 100 - 199 mg/dL  604  540  981  191   HDL >39 mg/dL  54  55  56  49   Calc LDL 0 - 99 mg/dL  478  295  621  97   Triglycerides 0 - 149 mg/dL  308  87  86  657   Creatinine 0.57 - 1.00 mg/dL 8.46  9.62  9.52  8.41        08/19/2023    3:00 PM 08/19/2023    2:04 PM 08/19/2023    2:01 PM 05/10/2023    2:14 PM 05/10/2023    1:08 PM 05/10/2023    1:05 PM 03/15/2023    3:57 PM  BP/Weight  Systolic BP 148 157 173 170 168 175 129  Diastolic BP 88 114 105 90 108 324 80  Wt. (Lbs)   135   137.12 139.04  BMI   23.91 kg/m2   24.29 kg/m2 27.15 kg/m2       No data to display          Updated lab needed at/ before next visit.

## 2023-08-22 NOTE — Assessment & Plan Note (Signed)
Reports lessening of   her grief and depression , has spoken with therapisrt

## 2023-08-22 NOTE — Assessment & Plan Note (Signed)
An increasing  challenge as patient is incapable of being 100  percent responsible for herself, she is aware of this, however has trust issues/paranoia with regards to family helping her as she maneuvers this stage of her life.   I will reach out to both  the nurse manager as well as to social worker as the family dynamics are sharp challenged significantly  The grandson who cares for her reports at this visit there is mention of possible nursing home placement unless  becomes more willing to get the help that she needs from family who cares as well as a fact that physically combative behavior is ".

## 2023-08-22 NOTE — Assessment & Plan Note (Signed)
Chronic pain management  is through  pain clinic

## 2023-08-22 NOTE — Assessment & Plan Note (Signed)
Updated lab needed at/ before next visit. Especially as she has impairment in memory and judgement

## 2023-09-02 ENCOUNTER — Telehealth: Payer: Self-pay | Admitting: *Deleted

## 2023-09-02 NOTE — Progress Notes (Unsigned)
Complex Care Management Care Guide Note  09/02/2023 Name: [name] MRN: [id number] DOB: [birth year]  [name] is a 89 y.o. year old female who is a primary care patient of [name], MD and is actively engaged with the care management team. I reached out to [name] by phone today to assist with re-scheduling  with the [name].  Follow up plan: Unsuccessful telephone outreach attempt made.  [facility]  Care Coordination Care Guide  Direct Dial: [phone]

## 2023-09-09 ENCOUNTER — Ambulatory Visit: Payer: Self-pay | Admitting: *Deleted

## 2023-09-09 NOTE — Progress Notes (Deleted)
  Cardiology Office Note:  .   Date:  09/09/2023  ID:  [name], DOB March 26, 1934, MRN [id number] PCP: Antonetta Rollene BRAVO, MD  [facility] Providers Cardiologist:  Jerel Balding, MD { }   History of Present Illness: .   [name] is a 89 y.o. female with chronic intermittent chest discomfort suggestive of angina, mild scattered CAD by heart cath in 2017, possible coronary artery spasm, hyperlipidemia, borderline diabetes, hypertension, and remote history of smoking quit 30 years ago.  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  Last seen by Dr. Balding on 10/22/2020 and was stable from a cardiac standpoint.  She was continued on nitrates and calcium  channel blocker for coronary vasospasms.  Zetia  10 mg daily was added to her atorvastatin  to reach a target goal of 70 plans for to stop HCTZ if blood pressure was well-controlled on follow-up visit.  ROS: ***  Studies Reviewed: SABRA      LHC 09/30/2015 Post Atrio lesion, 20% stenosed. Dist Cx lesion, 20% stenosed. Prox Cx lesion, 20% stenosed. Mid LAD to Dist LAD lesion, 20% stenosed. The left ventricular systolic function is normal.   Normal LV systolic function with an ejection fraction of 55-60%.   Mild nonobstructive CAD with 20% smooth mid LAD narrowing proximal to a mid LAD intramyocardial segment; smooth 20% proximal and mid left circumflex stenoses; and 20% stenosis in the distal RCA.  *** EKG Interpretation Date/Time:    Ventricular Rate:    PR Interval:    QRS Duration:    QT Interval:    QTC Calculation:   R Axis:      Text Interpretation:      Physical Exam:   VS:  There were no vitals taken for this visit.   Wt Readings from Last 3 Encounters:  08/19/23 135 lb (61.2 kg)  05/10/23 137 lb 1.9 oz (62.2 kg)  03/15/23 139 lb 0.6 oz (63.1 kg)    GEN: Well nourished, well developed in no acute distress NECK: No JVD; No carotid bruits CARDIAC: ***RRR, no murmurs,  rubs, gallops RESPIRATORY:  Clear to auscultation without rales, wheezing or rhonchi  ABDOMEN: Soft, non-tender, non-distended EXTREMITIES:  No edema; No deformity   ASSESSMENT AND PLAN: .   ***    {Are you ordering a CV Procedure (e.g. stress test, cath, DCCV, TEE, etc)?   Press F2        :[id number]}    Signed, Lamarr HERO. Jerilynn CHOL, ANP, AACC

## 2023-09-09 NOTE — Patient Outreach (Signed)
 Care Coordination   Follow Up Visit Note   09/09/2023  Name: [name] MRN: [id number] DOB: 1934/04/03  [name] is a 89 y.o. year old female who sees Antonetta Rollene BRAVO, MD for primary care. I spoke with  Gustav Lowe by phone today.  What matters to the patients health and wellness today?  Receive Counseling & Supportive Services to Reduce & Manage Symptoms of Depression, Grief & Loss.    Goals Addressed               This Visit's Progress     Receive Counseling & Supportive Services to Reduce & Manage Symptoms of Depression, Grief & Loss. (pt-stated)   On track     Care Coordination Intervention:  Interventions Today    Flowsheet Row Most Recent Value  Chronic Disease   Chronic disease during today's visit Hypertension (HTN), Other  [Pre-diabetes, Unsteady Balance/Gait, Frequent Falls, Forgetfulness, Osteoarthritis, Depression, Grief, Chronic Pain Syndrome, Sciatica, Dyspepsia, Osteoarthritis, Overweight, Hyperlipidemia, Bilateral Age-Related Nuclear & Cortical Cataracts.]  General Interventions   General Interventions Discussed/Reviewed General Interventions Discussed, Labs, Vaccines, Doctor Visits, Health Screening, Annual Foot Exam, General Interventions Reviewed, Annual Eye Exam, Durable Medical Equipment (DME), Community Resources, Level of Care, Communication with  [Encouraged Routine Engagement with Care Team Members & Providers.]  Labs Hgb A1c every 3 months, Hgb A1c annually  [Encouraged Routine Labwork.]  Vaccines COVID-19, Flu, Pneumonia, RSV, Shingles, Tetanus/Pertussis/Diphtheria  [Encouraged Annual Vaccinations.]  Doctor Visits Discussed/Reviewed Doctor Visits Discussed, Specialist, Doctor Visits Reviewed, Annual Wellness Visits, PCP  [Encouraged Routine Engagement with Care Team Members & Providers.]  Health Screening Bone Density, Colonoscopy, Mammogram  [Encouraged Routine Health Screenings.]  Durable Medical Equipment (DME) BP Cuff, Walker,  Other  [Prescription Eyeglasses.]  PCP/Specialist Visits Compliance with follow-up visit  [Encouraged Routine Engagement with Care Team Members & Providers.]  Communication with PCP/Specialists, RN, Pharmacists, Social Work  [employer] Routine Engagement with Care Team Members & Providers.]  Level of Care Adult Daycare, Air Traffic Controller, Assisted Living, Skilled Nursing Facility  [Confirmed Disinterest in Enrollment in Adult Day Care Program or Receiving Assistance Pursuing Higher Level of Care Placement Options (I.e Assisted Living Versus Skilled Nursing Facility).]  Applications Medicaid, Personal Care Services  [Confirmed Disinterest in Applying for Medicaid or Personal Care Services.]  Exercise Interventions   Exercise Discussed/Reviewed Exercise Discussed, Assistive device use and maintanence, Exercise Reviewed, Physical Activity, Weight Managment  [Encouraged Daily Exercise Regimen, as Tolerated.]  Physical Activity Discussed/Reviewed Physical Activity Discussed, Home Exercise Program (HEP), PREP, Physical Activity Reviewed, Gym, Types of exercise  [Encouraged Increased Level of Activity & Exercise, as Tolerated.]  Weight Management Weight maintenance  [Encouraged Healthy Diet.]  Education Interventions   Education Provided Provided Education  Provided Verbal Education On Nutrition, Mental Health/Coping with Illness, When to see the doctor, Foot Care, Eye Care, Labs, Blood Sugar Monitoring, Applications, Exercise, Medication, Development Worker, Community, [employer] Resources  [employer] Reviewed Educational Material & Encouraged Implementation.]  Labs Reviewed Hgb A1c  [Reviewed.]  Applications Medicaid, Personal Care Services  [Confirmed Disinterest in Applying for Medicaid or Personal Care Services.]  Mental Health Interventions   Mental Health Discussed/Reviewed Mental Health Discussed, Anxiety, Depression, Mental Health Reviewed, Grief and Loss, Coping Strategies, Substance Abuse, Suicide, Crisis, Other   [Assessed Mental Health & Cognitive Status.]  Nutrition Interventions   Nutrition Discussed/Reviewed Nutrition Discussed, Adding fruits and vegetables, Increasing proteins, Decreasing fats, Decreasing salt, Supplemental nutrition, Decreasing sugar intake, Portion sizes, Carbohydrate meal planning, Fluid intake, Nutrition Reviewed  Pharmacy Interventions   Pharmacy Dicussed/Reviewed Pharmacy  Topics Discussed, Medications and their functions, Medication Adherence, Pharmacy Topics Reviewed, Affording Medications  [Confirmed Prescription Medication Compliance.]  Medication Adherence --  [Confirmed Ability to [employer] Prescription Medications.]  Safety Interventions   Safety Discussed/Reviewed Safety Discussed, Safety Reviewed, Fall Risk, Home Safety  [Encouraged Consideration of Home Safety Evaluation.]  Home Safety Assistive Devices, Need for home safety assessment  [Encouraged Routine Use of Assistive Devices & Durable Medical Equipment.]  Advanced Directive Interventions   Advanced Directives Discussed/Reviewed Advanced Directives Discussed, Advanced Directives Reviewed  [Encouraged Initiation of Advanced Directives (Living Will & Healthcare Power of Corporate Treasurer), Offering to [employer], Assist with Completion, Make Copies & Scan Into Electronic Medical Record in Epic.]      Active Listening & Reflection Utilized.  Verbalization of Feelings Encouraged.  Emotional Support Provided. Acceptance & Commitment Therapy Initiated. Cognitive Behavioral Therapy Performed. Client-Centered Therapy Indicated. Encouraged Routine Engagement with Edwena Mayorga, Licensed Clinical Social Worker with [facility] [phone]), if You Have Questions, Need Assistance, or If Additional Social Work Needs Are Identified Between Now & Our Next Follow-Up Outreach Call, Scheduled on 10/07/2023 at 9:00 AM.      SDOH assessments and interventions completed:  Yes.  Care Coordination  Interventions:  Yes, provided.   Follow up plan: Follow up call scheduled for 10/07/2023 at 9:00 am.  Encounter Outcome:  Patient Visit Completed.   Philippe Desanctis, BSW, MSW, Printmaker Social Work Case Set Designer Health  [facility], Population Health Direct Dial: [phone]  Fax: [phone] Email: [email or web address]

## 2023-09-09 NOTE — Patient Instructions (Signed)
 Visit Information  Thank you for taking time to visit with me today. Please don't hesitate to contact me if I can be of assistance to you.   Following are the goals we discussed today:   Goals Addressed               This Visit's Progress     Receive Counseling & Supportive Services to Reduce & Manage Symptoms of Depression, Grief & Loss. (pt-stated)   On track     Care Coordination Intervention:  Interventions Today    Flowsheet Row Most Recent Value  Chronic Disease   Chronic disease during today's visit Hypertension (HTN), Other  [Pre-diabetes, Unsteady Balance/Gait, Frequent Falls, Forgetfulness, Osteoarthritis, Depression, Grief, Chronic Pain Syndrome, Sciatica, Dyspepsia, Osteoarthritis, Overweight, Hyperlipidemia, Bilateral Age-Related Nuclear & Cortical Cataracts.]  General Interventions   General Interventions Discussed/Reviewed General Interventions Discussed, Labs, Vaccines, Doctor Visits, Health Screening, Annual Foot Exam, General Interventions Reviewed, Annual Eye Exam, Durable Medical Equipment (DME), Community Resources, Level of Care, Communication with  [Encouraged Routine Engagement with Care Team Members & Providers.]  Labs Hgb A1c every 3 months, Hgb A1c annually  [Encouraged Routine Labwork.]  Vaccines COVID-19, Flu, Pneumonia, RSV, Shingles, Tetanus/Pertussis/Diphtheria  [Encouraged Annual Vaccinations.]  Doctor Visits Discussed/Reviewed Doctor Visits Discussed, Specialist, Doctor Visits Reviewed, Annual Wellness Visits, PCP  [Encouraged Routine Engagement with Care Team Members & Providers.]  Health Screening Bone Density, Colonoscopy, Mammogram  [Encouraged Routine Health Screenings.]  Durable Medical Equipment (DME) BP Cuff, Walker, Other  [Prescription Eyeglasses.]  PCP/Specialist Visits Compliance with follow-up visit  [Encouraged Routine Engagement with Care Team Members & Providers.]  Communication with PCP/Specialists, RN, Pharmacists, Social Work   [employer] Routine Engagement with Care Team Members & Providers.]  Level of Care Adult Daycare, Air Traffic Controller, Assisted Living, Skilled Nursing Facility  [Confirmed Disinterest in Enrollment in Adult Day Care Program or Receiving Assistance Pursuing Higher Level of Care Placement Options (I.e Assisted Living Versus Skilled Nursing Facility).]  Applications Medicaid, Personal Care Services  [Confirmed Disinterest in Applying for Medicaid or Personal Care Services.]  Exercise Interventions   Exercise Discussed/Reviewed Exercise Discussed, Assistive device use and maintanence, Exercise Reviewed, Physical Activity, Weight Managment  [Encouraged Daily Exercise Regimen, as Tolerated.]  Physical Activity Discussed/Reviewed Physical Activity Discussed, Home Exercise Program (HEP), PREP, Physical Activity Reviewed, Gym, Types of exercise  [Encouraged Increased Level of Activity & Exercise, as Tolerated.]  Weight Management Weight maintenance  [Encouraged Healthy Diet.]  Education Interventions   Education Provided Provided Education  Provided Verbal Education On Nutrition, Mental Health/Coping with Illness, When to see the doctor, Foot Care, Eye Care, Labs, Blood Sugar Monitoring, Applications, Exercise, Medication, Development Worker, Community, [employer] Resources  [employer] Reviewed Educational Material & Encouraged Implementation.]  Labs Reviewed Hgb A1c  [Reviewed.]  Applications Medicaid, Personal Care Services  [Confirmed Disinterest in Applying for Medicaid or Personal Care Services.]  Mental Health Interventions   Mental Health Discussed/Reviewed Mental Health Discussed, Anxiety, Depression, Mental Health Reviewed, Grief and Loss, Coping Strategies, Substance Abuse, Suicide, Crisis, Other  [Assessed Mental Health & Cognitive Status.]  Nutrition Interventions   Nutrition Discussed/Reviewed Nutrition Discussed, Adding fruits and vegetables, Increasing proteins, Decreasing fats, Decreasing salt, Supplemental  nutrition, Decreasing sugar intake, Portion sizes, Carbohydrate meal planning, Fluid intake, Nutrition Reviewed  Pharmacy Interventions   Pharmacy Dicussed/Reviewed Pharmacy Topics Discussed, Medications and their functions, Medication Adherence, Pharmacy Topics Reviewed, Affording Medications  [Confirmed Prescription Medication Compliance.]  Medication Adherence --  [Confirmed Ability to Afford Prescription Medications.]  Safety Interventions   Safety Discussed/Reviewed Safety Discussed,  Safety Reviewed, Fall Risk, Home Safety  [Encouraged Consideration of Home Safety Evaluation.]  Home Safety Assistive Devices, Need for home safety assessment  [Encouraged Routine Use of Assistive Devices & Durable Medical Equipment.]  Advanced Directive Interventions   Advanced Directives Discussed/Reviewed Advanced Directives Discussed, Advanced Directives Reviewed  [Encouraged Initiation of Advanced Directives (Living Will & Healthcare Power of Corporate Treasurer), Offering to [employer], Assist with Completion, Make Copies & Scan Into Electronic Medical Record in Epic.]      Active Listening & Reflection Utilized.  Verbalization of Feelings Encouraged.  Emotional Support Provided. Acceptance & Commitment Therapy Initiated. Cognitive Behavioral Therapy Performed. Client-Centered Therapy Indicated. Encouraged Routine Engagement with Suprina Mandeville, Licensed Clinical Social Worker with [facility] [phone]), if You Have Questions, Need Assistance, or If Additional Social Work Needs Are Identified Between Now & Our Next Follow-Up Outreach Call, Scheduled on 10/07/2023 at 9:00 AM.      Our next appointment is by telephone on 10/07/2023 at 9:00 am.  Please call the care guide team at [phone] if you need to cancel or reschedule your appointment.   If you are experiencing a Mental Health or Behavioral Health Crisis or need someone to talk to, please call the Suicide and Crisis  Lifeline: 988 call the [place]  National Suicide Prevention Lifeline: 581-148-5614 or TTY: 848-215-2362 TTY 706-434-1486) to talk to a trained counselor call 1-800-273-TALK (toll free, 24 hour hotline) go to [facility] [address], [place] [phone]) call the [facility] Crisis Line: 7195720978 call 911  Patient verbalizes understanding of instructions and care plan provided today and agrees to view in MyChart. Active MyChart status and patient understanding of how to access instructions and care plan via MyChart confirmed with patient.     Telephone follow up appointment with care management team member scheduled for:   10/07/2023 at 9:00 am.  Irmalee Riemenschneider, BSW, MSW, LCSW  Embedded [employer] Social Work Case Manager  [facility], Population Health Direct Dial: [phone]  Fax: [phone] Email: [email or web address]

## 2023-09-13 ENCOUNTER — Ambulatory Visit: Payer: Medicare HMO | Admitting: Adult Health

## 2023-09-15 NOTE — Progress Notes (Signed)
 Complex Care Management Care Guide Note  09/15/2023 Name: [name] MRN: [id number] DOB: [birth year]  [name] is a 89 y.o. year old female who is a primary care patient of Antonetta Rollene BRAVO, MD and is actively engaged with the care management team. I reached out to Azilee Vogelsang by phone today to assist with scheduling  with the RN Case Manager.  Follow up plan: Telephone appointment with complex care management team member scheduled for:  09/20/23  [facility]  Care Coordination Care Guide  Direct Dial: [phone]

## 2023-09-20 ENCOUNTER — Ambulatory Visit: Payer: Self-pay | Admitting: *Deleted

## 2023-09-20 NOTE — Patient Outreach (Signed)
  Care Coordination   Follow Up Visit Note   09/21/2023 Name: [name] MRN: [id number] DOB: [birth year]  [name] is a 89 y.o. year old female who sees Kaitlyn Rollene BRAVO, MD for primary care. I spoke with  [name] 's daughter [name] by phone today.  What matters to the patients health and wellness today?  Outreach to daughter, unable to reach patient Patient memory changes, home safety/ living with her daughter or grandson live near patient, pill packaging  [name] states she believes [name] is not acknowledging noticeable changes. [name] stayed with her daughter for 4 weeks during the 2024 holidays but patient requested to return home in first week of 2025.  Her daughter outreaches to patient around 4 times a day (plan of care) Daughter has noticed [name] frequently outreaches in a day time asking similar questions & recent increase purchases by evidence of increased packages/items noted in the home. Recently questions have been related to the patient's phone Daughter lives in a 3 story [place] home & voices potential safety concerns related to falling.  support Daughter primary goal is my mother's well being is my first priority Patient's son in law, grandson are also very supportive Darden has access to patient my chart Patient has shared some things with her son in law about future care     Goals Addressed             This Visit's Progress    home management of memory concern & pain management - [facility]BCI care coordination       Patient with receive outreaches from VBCI SW & RN CM Patient will attend all medical appointments Patient will take medications as ordered  Patient can be assisted with pill packaging and caregiver assist to daughter, [name] Interventions Today    Flowsheet Row Most Recent Value  Chronic Disease   Chronic disease during today's visit Hypertension (HTN), Other  [spoke with patient's daughter [name], memory  changes, increase purchases, home safety]  General Interventions   General Interventions Discussed/Reviewed General Interventions Reviewed, [employer], Doctor Visits, Communication with  Doctor Visits Discussed/Reviewed Doctor Visits Reviewed, PCP  PCP/Specialist Visits Compliance with follow-up visit  Communication with PCP/Specialists  Education Interventions   Education Provided Provided Education, Provided Web-based Education  Provided Verbal Education On [employer], Other  [aging gracefully, dementia specialist]  Mental Health Interventions   Mental Health Discussed/Reviewed Mental Health Reviewed, Coping Strategies, Other  [memory changes]  Pharmacy Interventions   Pharmacy Dicussed/Reviewed Pharmacy Topics Reviewed, Medication Adherence  Medication Adherence --  [discussed pill packaging with her daughter]  Safety Interventions   Safety Discussed/Reviewed Safety Reviewed, Home Safety, Fall Risk  [discussed patient's home safety with Daughter [name]]  Home Safety Assistive Devices              SDOH assessments and interventions completed:  No     Care Coordination Interventions:  Yes, provided   Follow up plan: Follow up call scheduled for 10/05/23    Encounter Outcome:  Patient Visit Completed   Suzen L. Ramonita, RN, BSN, CCM [place]  [facility], [facility] RN Care Manager Direct Dial: [phone]  Fax: [phone] Mailing Address: [address]  [place] [address] Website: [email or web address]

## 2023-10-05 ENCOUNTER — Ambulatory Visit: Payer: Self-pay | Admitting: *Deleted

## 2023-10-05 NOTE — Patient Instructions (Signed)
 Visit Information  Thank you for taking time to visit with me today. Please don't hesitate to contact me if I can be of assistance to you.   Following are the goals we discussed today:   Goals Addressed             This Visit's Progress    Safe home management related to confusion/memory concern & pain management - VBCI care coordination       Patient with receive outreaches from VBCI SW & RN CM- 10/05/23 remains active Patient will attend all medical appointments 10/05/23 on track Patient will take medications as ordered - 10/05/23 on track  Patient can be assisted with pill packaging plus caregiver assist to be provided to daughter, Phyliss- pending   Interventions Today    Flowsheet Row Most Recent Value  Chronic Disease   Chronic disease during today's visit Other, Hypertension (HTN)  [Follow up, educ to daughter/address, Cardiac/back pain, update pcp/SW- upcoming trip,]  General Interventions   General Interventions Discussed/Reviewed General Interventions Reviewed, Durable Medical Equipment (DME), Doctor Visits, Communication with  Doctor Visits Discussed/Reviewed Doctor Visits Reviewed, PCP, Specialist  Durable Medical Equipment (DME) Other, Walker  [uses cane mostly for ambulation]  PCP/Specialist Visits Compliance with follow-up visit  Communication with PCP/Specialists, Social Work  Exercise Interventions   Exercise Discussed/Reviewed Exercise Reviewed, Physical Activity  [confirm remaining active- walks to mail box using cane & throughout home]  Physical Activity Discussed/Reviewed Physical Activity Reviewed, Types of exercise  [walking]  Education Interventions   Education Provided Provided Therapist, Sports, Provided Education, Provided Web-based Education  Provided Verbal Education On Other, When to see the doctor, Medication, Exercise, Mental Health/Coping with Illness  [pain management home care, socialization, assessed for cardiac, back pain worsening symptoms,  obtained daughter address to send printed education]  Mental Health Interventions   Mental Health Discussed/Reviewed Mental Health Reviewed, Coping Strategies, Grief and Loss, Depression  [support provided for her confusion today, loss of her friend, Encouraged her to continue to be active with J Saporito, SW]  Pharmacy Interventions   Pharmacy Dicussed/Reviewed Pharmacy Topics Reviewed, Affording Medications, Medications and their functions  Safety Interventions   Safety Discussed/Reviewed Safety Reviewed, Home Safety, Fall Risk  [discussed safety for fall risks with walking outside, inquired about interest in relocating to [facility] - not ready]  Home Safety Assistive Devices  [Discussed availability to have local post office assist with relocating mail box closer to home's doorway. Patient reports her mail box is not to far from her home & she enjoys walking outside]              Our next appointment is by telephone on 10/05/23 at 1600  Please call the care guide team at [phone] if you need to cancel or reschedule your appointment.   If you are experiencing a Mental Health or Behavioral Health Crisis or need someone to talk to, please call the Suicide and Crisis Lifeline: 988 call the [place]  National Suicide Prevention Lifeline: 5062659384 or TTY: 908 367 2498 TTY 5034438374) to talk to a trained counselor call 1-800-273-TALK (toll free, 24 hour hotline) call the [facility]: 918 017 6180 call 911   Patient verbalizes understanding of instructions and care plan provided today and agrees to view in MyChart. Active MyChart status and patient understanding of how to access instructions and care plan via MyChart confirmed with patient.     The patient has been provided with contact information for the care management team and has been advised to call with any  health related questions or concerns.   Aruna Nestler L. Ramonita, RN, BSN, CCM [place]  [facility]  Institute, [facility] RN Care Manager Direct Dial: [phone]  Fax: [phone] Mailing Address: [address]  [place] [address] Website: [email or web address]

## 2023-10-05 NOTE — Patient Instructions (Addendum)
Visit Information  Thank you for taking time to visit with me today. Please don't hesitate to contact me if I can be of assistance to you.   Following are the goals we discussed today:   Goals Addressed             This Visit's Progress    Safe home management related to confusion/memory concern & pain management - VBCI care coordination   On track    Patient with receive outreaches from VBCI SW & RN CM- 10/05/23 remains active Patient will attend all medical appointments 10/05/23 on track Patient will take medications as ordered - 10/05/23 on track  Patient can be assisted with pill packaging plus caregiver assist to be provided to daughter, Phyliss- pending   Interventions Today    Flowsheet Row Most Recent Value  Chronic Disease   Chronic disease during today's visit Other, Hypertension (HTN)  [Follow up, educ to daughter/address, Cardiac/back pain, update pcp/SW- upcoming trip,]  General Interventions   General Interventions Discussed/Reviewed General Interventions Reviewed, Durable Medical Equipment (DME), Doctor Visits, Communication with  Doctor Visits Discussed/Reviewed Doctor Visits Reviewed, PCP, Specialist  Durable Medical Equipment (DME) Other, Walker  [uses cane mostly for ambulation]  PCP/Specialist Visits Compliance with follow-up visit  Communication with PCP/Specialists, Social Work  Exercise Interventions   Exercise Discussed/Reviewed Exercise Reviewed, Physical Activity  [confirm remaining active- walks to mail box using cane & throughout home]  Physical Activity Discussed/Reviewed Physical Activity Reviewed, Types of exercise  [walking]  Education Interventions   Education Provided Provided Therapist, sports, Provided Education, Provided Web-based Education  Provided Verbal Education On Other, When to see the doctor, Medication, Exercise, Mental Health/Coping with Illness  [pain management home care, socialization, assessed for cardiac, back pain worsening symptoms,  obtained daughter address to send printed education]  Mental Health Interventions   Mental Health Discussed/Reviewed Mental Health Reviewed, Coping Strategies, Grief and Loss, Depression  [support provided for her confusion today, loss of her friend, Encouraged her to continue to be active with Nelida Gores, SW]  Pharmacy Interventions   Pharmacy Dicussed/Reviewed Pharmacy Topics Reviewed, Affording Medications, Medications and their functions  Safety Interventions   Safety Discussed/Reviewed Safety Reviewed, Home Safety, Fall Risk  [discussed safety for fall risks with walking outside, inquired about interest in relocating to [facility] - not ready]  Home Safety Assistive Devices  [Discussed availability to have local post office assist with relocating mail box closer to home's doorway. Patient reports her mail box is not to far from her home & she enjoys walking outside]              Our next appointment is by telephone on 11/04/23 at 3:15 pm  Please call the care guide team at [phone] if you need to cancel or reschedule your appointment.   If you are experiencing a Mental Health or Behavioral Health Crisis or need someone to talk to, please call the Suicide and Crisis Lifeline: 988 call the [place] National Suicide Prevention Lifeline: (385) 235-1005 or TTY: (732)878-3725 TTY 434-503-2872) to talk to a trained counselor call 1-800-273-TALK (toll free, 24 hour hotline) call the [facility]: 484-621-9478 call 911   Patient verbalizes understanding of instructions and care plan provided today and agrees to view in MyChart. Active MyChart status and patient understanding of how to access instructions and care plan via MyChart confirmed with patient.     The patient has been provided with contact information for the care management team and has been advised to call  with any health related questions or concerns.    Edmund Holcomb L. Noelle Penner, RN, BSN, CCM [place]  Value [facility] Care  Institute, [facility] RN Care Manager Direct Dial: [phone]  Fax: [phone] Mailing Address: [address]  [place] [address] Website: [email or web address]

## 2023-10-05 NOTE — Patient Outreach (Signed)
Care Coordination   Follow Up Visit Note   10/05/2023 Name: [name] MRN: [id number] DOB: [birth year]  [name] is a 89 y.o. year old female who sees [name], MD for primary care. I spoke with  [name] by phone today.  What matters to the patients health and wellness today?  "Turned around some today " (woke up feeling sad and a little confused) - Worsening back pain today  She reports she is washing her [place] and staying active. She states this may help her to recall something she could not remember when she woke up this morning from a dream.   Patient confirms she is going to visit with her daughter in [place] for the upcoming super bowl.  Reports she likes [place] but "I have not gotten to that point yet" to move to [facility] with her daughter   Cardiac- denies chest pain, headaches   Back pain since got up this morning,has back brace on, took ordered medicine, confirms she is active with pain management providers. She reports she will reach out to them if the pain gets worse.  Repeated several statements today about her pcp, upcoming pcp visit  She voices appreciation of her pcp, Dr Lodema Hong and wants RN CM to make pcp and any other medical aware she would be out of town for a few weeks. (Done)     Goals Addressed             This Visit's Progress    Safe home management related to confusion/memory concern & pain management - VBCI care coordination   On track    Patient with receive outreaches from VBCI SW & RN CM- 10/05/23 remains active Patient will attend all medical appointments 10/05/23 on track Patient will take medications as ordered - 10/05/23 on track  Patient can be assisted with pill packaging plus caregiver assist to be provided to daughter, [name]- pending   Interventions Today    Flowsheet Row Most Recent Value  Chronic Disease   Chronic disease during today's visit Other, Hypertension (HTN)  [Follow up, educ to  daughter/address, Cardiac/back pain, update pcp/SW- upcoming trip,]  General Interventions   General Interventions Discussed/Reviewed General Interventions Reviewed, Durable Medical Equipment (DME), Doctor Visits, Communication with  Doctor Visits Discussed/Reviewed Doctor Visits Reviewed, PCP, Specialist  Durable Medical Equipment (DME) Other, Walker  [uses cane mostly for ambulation]  PCP/Specialist Visits Compliance with follow-up visit  Communication with PCP/Specialists, Social Work  Exercise Interventions   Exercise Discussed/Reviewed Exercise Reviewed, Physical Activity  [confirm remaining active- walks to mail box using cane & throughout home]  Physical Activity Discussed/Reviewed Physical Activity Reviewed, Types of exercise  [walking]  Education Interventions   Education Provided Provided Therapist, sports, Provided Education, Provided Web-based Education  Provided Verbal Education On Other, When to see the doctor, Medication, Exercise, Mental Health/Coping with Illness  [pain management home care, socialization, assessed for cardiac, back pain worsening symptoms, obtained daughter address to send printed education]  Mental Health Interventions   Mental Health Discussed/Reviewed Mental Health Reviewed, Coping Strategies, Grief and Loss, Depression  [support provided for her confusion today, loss of her friend, Encouraged her to continue to be active with [name], SW]  Pharmacy Interventions   Pharmacy Dicussed/Reviewed Pharmacy Topics Reviewed, Affording Medications, Medications and their functions  Safety Interventions   Safety Discussed/Reviewed Safety Reviewed, Home Safety, Fall Risk  [discussed safety for fall risks with walking outside, inquired about interest in relocating to [facility] - not ready]  Home  Safety Assistive Devices  [Discussed availability to have local post office assist with relocating mail box closer to home's doorway. Patient reports her mail box is not to far from  her home & she enjoys walking outside]              SDOH assessments and interventions completed:  No     Care Coordination Interventions:  Yes, provided   Follow up plan: Follow up call scheduled for 11/04/23    Encounter Outcome:  Patient Visit Completed   Cala Bradford L. Noelle Penner, RN, BSN, CCM [place]  [facility] Institute, [facility] Health RN Care Manager Direct Dial: [phone]  Fax: [phone] Mailing Address: [address]  [place] [address] Website: [email or web address]

## 2023-10-07 ENCOUNTER — Ambulatory Visit: Payer: Self-pay | Admitting: *Deleted

## 2023-10-07 NOTE — Patient Outreach (Signed)
Care Coordination   Follow Up Visit Note   10/07/2023  Name: Chelesea Weiand MRN: [id number] DOB: [birth year]  Ember Henrikson is a 89 y.o. year old female who sees [name], MD for primary care. I spoke with [name] by phone today.  What matters to the patients health and wellness today?  Receive Counseling & Supportive Services to Reduce & Manage Symptoms of Depression, Grief & Loss.    Goals Addressed               This Visit's Progress     COMPLETED: Receive Counseling & Supportive Services to Reduce & Manage Symptoms of Depression, Grief & Loss. (pt-stated)   On track     Care Coordination Intervention:  Interventions Today    Flowsheet Row Most Recent Value  Chronic Disease   Chronic disease during today's visit Hypertension (HTN), Other  [Pre-diabetes, Unsteady Balance/Gait, Frequent Falls, Forgetfulness, Osteoarthritis, Depression, Grief, Chronic Pain Syndrome, Sciatica, Dyspepsia, Osteoarthritis, Overweight, Hyperlipidemia, Bilateral Age-Related Nuclear & Cortical Cataracts.]  General Interventions   General Interventions Discussed/Reviewed General Interventions Discussed, Labs, Vaccines, Doctor Visits, Health Screening, Annual Foot Exam, General Interventions Reviewed, Annual Eye Exam, Durable Medical Equipment (DME), Community Resources, Level of Care, Communication with  [Encouraged Routine Engagement with Care Team Members & Providers.]  Labs Hgb A1c every 3 months, Hgb A1c annually  [Encouraged Routine Labwork.]  Vaccines COVID-19, Flu, Pneumonia, RSV, Shingles, Tetanus/Pertussis/Diphtheria  [Encouraged Annual Vaccinations.]  Doctor Visits Discussed/Reviewed Doctor Visits Discussed, Specialist, Doctor Visits Reviewed, Annual Wellness Visits, PCP  [Encouraged Routine Engagement with Care Team Members & Providers.]  Health Screening Bone Density, Colonoscopy, Mammogram  [Encouraged Routine Health Screenings.]  Durable Medical Equipment (DME) BP  Cuff, Walker, Other  [Prescription Eyeglasses.]  PCP/Specialist Visits Compliance with follow-up visit  [Encouraged Routine Engagement with Care Team Members & Providers.]  Communication with PCP/Specialists, RN, Pharmacists, Social Work  [employer] Routine Engagement with Care Team Members & Providers.]  Level of Care Adult Daycare, Air traffic controller, Assisted Living, Skilled Nursing Facility  [Confirmed Disinterest in Enrollment in Adult Day Care Program or Receiving Assistance Pursuing Higher Level of Care Placement Options (I.e Assisted Living Versus Skilled Nursing Facility).]  Applications Medicaid, Personal Care Services  [Confirmed Disinterest in Applying for Medicaid or Personal Care Services.]  Exercise Interventions   Exercise Discussed/Reviewed Exercise Discussed, Assistive device use and maintanence, Exercise Reviewed, Physical Activity, Weight Managment  [Encouraged Daily Exercise Regimen, as Tolerated.]  Physical Activity Discussed/Reviewed Physical Activity Discussed, Home Exercise Program (HEP), PREP, Physical Activity Reviewed, Gym, Types of exercise  [Encouraged Increased Level of Activity & Exercise, as Tolerated.]  Weight Management Weight maintenance  [Encouraged Healthy Diet.]  Education Interventions   Education Provided Provided Education  Provided Verbal Education On Nutrition, Mental Health/Coping with Illness, When to see the doctor, Foot Care, Eye Care, Labs, Blood Sugar Monitoring, Applications, Exercise, Medication, Development worker, community, [employer]  [employer] Reviewed Educational Material & Encouraged Implementation.]  Labs Reviewed Hgb A1c  [Reviewed.]  Applications Medicaid, Personal Care Services  [Confirmed Disinterest in Applying for Medicaid or Personal Care Services.]  Mental Health Interventions   Mental Health Discussed/Reviewed Mental Health Discussed, Anxiety, Depression, Mental Health Reviewed, Grief and Loss, Coping Strategies, Substance Abuse, Suicide,  Crisis, Other  [Assessed Mental Health & Cognitive Status.]  Nutrition Interventions   Nutrition Discussed/Reviewed Nutrition Discussed, Adding fruits and vegetables, Increasing proteins, Decreasing fats, Decreasing salt, Supplemental nutrition, Decreasing sugar intake, Portion sizes, Carbohydrate meal planning, Fluid intake, Nutrition Reviewed  Pharmacy Interventions   Pharmacy Dicussed/Reviewed Pharmacy  Topics Discussed, Medications and their functions, Medication Adherence, Pharmacy Topics Reviewed, Affording Medications  [Confirmed Prescription Medication Compliance.]  Medication Adherence --  [Confirmed Ability to [employer] Prescription Medications.]  Safety Interventions   Safety Discussed/Reviewed Safety Discussed, Safety Reviewed, Fall Risk, Home Safety  [Encouraged Consideration of Home Safety Evaluation.]  Home Safety Assistive Devices, Need for home safety assessment  [Encouraged Routine Use of Assistive Devices & Durable Medical Equipment.]  Advanced Directive Interventions   Advanced Directives Discussed/Reviewed Advanced Directives Discussed, Advanced Directives Reviewed  [Encouraged Initiation of Advanced Directives (Living Will & Healthcare Power of Corporate treasurer), Offering to [employer], Assist with Completion, Make Copies & Scan Into Electronic Medical Record in Epic.]      Active Listening & Reflection Utilized.  Verbalization of Feelings Encouraged.  Emotional Support Provided. Acceptance & Commitment Therapy Initiated. Encouraged Engagement with Danford Bad, Licensed Clinical Social Worker with [facility], [facility] [phone]), if You Have Questions, Need Assistance, Additional Social Work Needs Are Identified in The Near Future, or If You Change Your Mind About Wanting to Receive Social Work Services.       SDOH assessments and interventions completed:  Yes.  Care Coordination Interventions:  Yes, provided.   Follow up  plan: No further intervention required.   Encounter Outcome:  Patient Visit Completed.   Danford Bad, BSW, MSW, LCSW [facility], [facility] Clinical Social Worker II Direct Dial: [phone]  Fax: [phone] Website: [email or web address]

## 2023-10-07 NOTE — Patient Instructions (Signed)
Visit Information  Thank you for taking time to visit with me today. Please don't hesitate to contact me if I can be of assistance to you.   Following are the goals we discussed today:   Goals Addressed               This Visit's Progress     COMPLETED: Receive Counseling & Supportive Services to Reduce & Manage Symptoms of Depression, Grief & Loss. (pt-stated)   On track     Care Coordination Intervention:  Interventions Today    Flowsheet Row Most Recent Value  Chronic Disease   Chronic disease during today's visit Hypertension (HTN), Other  [Pre-diabetes, Unsteady Balance/Gait, Frequent Falls, Forgetfulness, Osteoarthritis, Depression, Grief, Chronic Pain Syndrome, Sciatica, Dyspepsia, Osteoarthritis, Overweight, Hyperlipidemia, Bilateral Age-Related Nuclear & Cortical Cataracts.]  General Interventions   General Interventions Discussed/Reviewed General Interventions Discussed, Labs, Vaccines, Doctor Visits, Health Screening, Annual Foot Exam, General Interventions Reviewed, Annual Eye Exam, Durable Medical Equipment (DME), Community Resources, Level of Care, Communication with  [Encouraged Routine Engagement with Care Team Members & Providers.]  Labs Hgb A1c every 3 months, Hgb A1c annually  [Encouraged Routine Labwork.]  Vaccines COVID-19, Flu, Pneumonia, RSV, Shingles, Tetanus/Pertussis/Diphtheria  [Encouraged Annual Vaccinations.]  Doctor Visits Discussed/Reviewed Doctor Visits Discussed, Specialist, Doctor Visits Reviewed, Annual Wellness Visits, PCP  [Encouraged Routine Engagement with Care Team Members & Providers.]  Health Screening Bone Density, Colonoscopy, Mammogram  [Encouraged Routine Health Screenings.]  Durable Medical Equipment (DME) BP Cuff, Walker, Other  [Prescription Eyeglasses.]  PCP/Specialist Visits Compliance with follow-up visit  [Encouraged Routine Engagement with Care Team Members & Providers.]  Communication with PCP/Specialists, RN, Pharmacists, Social  Work  [employer] Routine Engagement with Care Team Members & Providers.]  Level of Care Adult Daycare, Air traffic controller, Assisted Living, Skilled Nursing Facility  [Confirmed Disinterest in Enrollment in Adult Day Care Program or Receiving Assistance Pursuing Higher Level of Care Placement Options (I.e Assisted Living Versus Skilled Nursing Facility).]  Applications Medicaid, Personal Care Services  [Confirmed Disinterest in Applying for Medicaid or Personal Care Services.]  Exercise Interventions   Exercise Discussed/Reviewed Exercise Discussed, Assistive device use and maintanence, Exercise Reviewed, Physical Activity, Weight Managment  [Encouraged Daily Exercise Regimen, as Tolerated.]  Physical Activity Discussed/Reviewed Physical Activity Discussed, Home Exercise Program (HEP), PREP, Physical Activity Reviewed, Gym, Types of exercise  [Encouraged Increased Level of Activity & Exercise, as Tolerated.]  Weight Management Weight maintenance  [Encouraged Healthy Diet.]  Education Interventions   Education Provided Provided Education  Provided Verbal Education On Nutrition, Mental Health/Coping with Illness, When to see the doctor, Foot Care, Eye Care, Labs, Blood Sugar Monitoring, Applications, Exercise, Medication, Development worker, community, [employer]  [employer] Reviewed Educational Material & Encouraged Implementation.]  Labs Reviewed Hgb A1c  [Reviewed.]  Applications Medicaid, Personal Care Services  [Confirmed Disinterest in Applying for Medicaid or Personal Care Services.]  Mental Health Interventions   Mental Health Discussed/Reviewed Mental Health Discussed, Anxiety, Depression, Mental Health Reviewed, Grief and Loss, Coping Strategies, Substance Abuse, Suicide, Crisis, Other  [Assessed Mental Health & Cognitive Status.]  Nutrition Interventions   Nutrition Discussed/Reviewed Nutrition Discussed, Adding fruits and vegetables, Increasing proteins, Decreasing fats, Decreasing salt, Supplemental  nutrition, Decreasing sugar intake, Portion sizes, Carbohydrate meal planning, Fluid intake, Nutrition Reviewed  Pharmacy Interventions   Pharmacy Dicussed/Reviewed Pharmacy Topics Discussed, Medications and their functions, Medication Adherence, Pharmacy Topics Reviewed, Affording Medications  [Confirmed Prescription Medication Compliance.]  Medication Adherence --  [Confirmed Ability to Afford Prescription Medications.]  Safety Interventions   Safety Discussed/Reviewed Safety  Discussed, Safety Reviewed, Fall Risk, Home Safety  [Encouraged Consideration of Home Safety Evaluation.]  Home Safety Assistive Devices, Need for home safety assessment  [Encouraged Routine Use of Assistive Devices & Durable Medical Equipment.]  Advanced Directive Interventions   Advanced Directives Discussed/Reviewed Advanced Directives Discussed, Advanced Directives Reviewed  [Encouraged Initiation of Advanced Directives (Living Will & Healthcare Power of Corporate treasurer), Offering to [employer], Assist with Completion, Make Copies & Scan Into Electronic Medical Record in Epic.]      Active Listening & Reflection Utilized.  Verbalization of Feelings Encouraged.  Emotional Support Provided. Acceptance & Commitment Therapy Initiated. Encouraged Engagement with Danford Bad, Licensed Clinical Social Worker with [facility], [facility] [phone]), if You Have Questions, Need Assistance, Additional Social Work Needs Are Identified in The Near Future, or If You Change Your Mind About Wanting to Receive Social Work Services.       Please call the care guide team at [phone] if you need to cancel or reschedule your appointment.   If you are experiencing a Mental Health or Behavioral Health Crisis or need someone to talk to, please call the Suicide and Crisis Lifeline: 988 call the [place] National Suicide Prevention Lifeline: 414-693-5875 or TTY: 9393041705 TTY  954-366-7842) to talk to a trained counselor call 1-800-273-TALK (toll free, 24 hour hotline) go to [facility] [address] [place] [phone]) call the [facility] Crisis Line: (978) 125-1817 call 911  Patient verbalizes understanding of instructions and care plan provided today and agrees to view in MyChart. Active MyChart status and patient understanding of how to access instructions and care plan via MyChart confirmed with patient.     No further follow up required.   Danford Bad, BSW, MSW, LCSW [facility], [facility] Clinical Social Worker II Direct Dial: [phone]  Fax: [phone] Website: [email or web address]

## 2023-10-10 DIAGNOSIS — M545 Low back pain, unspecified: Secondary | ICD-10-CM | POA: Diagnosis not present

## 2023-10-19 ENCOUNTER — Ambulatory Visit: Payer: Medicare HMO | Admitting: Family Medicine

## 2023-10-25 ENCOUNTER — Ambulatory Visit: Payer: Medicare HMO | Admitting: Cardiovascular Disease

## 2023-11-03 ENCOUNTER — Ambulatory Visit: Payer: Self-pay | Admitting: *Deleted

## 2023-11-03 NOTE — Patient Outreach (Addendum)
 Care Coordination   Follow Up Visit Note   12/10/2023 updated note for 11/03/23 Name: [name] MRN: [id number] DOB: 1934-02-27  [name] is a 89 y.o. year old female who sees [name], MD for primary care. I spoke with  [name] by phone today.  What matters to the patients health and wellness today?  Returning back to [place] from  [facility]. Denies any worsening medical issues at this time   Introduced patient to program aging gracefully . Voiced under standing when reviewed      Goals Addressed             This Visit's Progress    Safe home management related to confusion/memory concern & pain management - [facility] care coordination       Patient with receive outreaches from [facility] SW & RN CM- 11/03/23 remains active Patient will attend all medical appointments 11/03/23 on track Patient will take medications as ordered -11/03/23 on track  Patient can be assisted with pill packaging plus caregiver assist to be provided to daughter, Phyliss- pending   Interventions Today    Flowsheet Row Most Recent Value  Chronic Disease   Chronic disease during today's visit Other, Hypertension (HTN)  remains active with /SW- reviewed previous trip,Review aging gracefully program option]  General Interventions   General Interventions Discussed/Reviewed General Interventions Reviewed, Durable Medical Equipment (DME), Doctor Visits, Communication with  Doctor Visits Discussed/Reviewed Doctor Visits Reviewed, PCP, Specialist  Durable Medical Equipment (DME) Other, Walker  [uses cane mostly for ambulation]  PCP/Specialist Visits Compliance with follow-up visit  Communication with PCP/Specialists, Social Work  Exercise Interventions   Exercise Discussed/Reviewed Exercise Reviewed, Physical Activity  [confirm remaining active- walks to mail box using cane & throughout home]  Physical Activity Discussed/Reviewed Physical Activity Reviewed, Types of exercise  [walking]   Education Interventions   Education Provided Provided Therapist, sports, Provided Web-based Education, Provided Education  Special educational needs teacher Education On Other, When to see the doctor, Medication, Exercise, Mental Health/Coping with Illness  [Aging gracefully]  Mental Health Interventions   Mental Health Discussed/Reviewed Mental Health Reviewed, Coping Strategies, Grief and Loss, Depression  [support provided for her confusion today, loss of her friend, Encouraged her to continue to be active with Nelida Gores, SW]  Pharmacy Interventions   Pharmacy Dicussed/Reviewed Pharmacy Topics Reviewed, Affording Medications, Medications and their functions  Safety Interventions   Safety Discussed/Reviewed Safety Reviewed, Home Safety, Fall Risk  [discussed safety for fall risks with walking outside, inquired about interest in relocating to [facility] - not ready]  Home Safety Assistive Devices               SDOH assessments and interventions completed:  No     Care Coordination Interventions:  Yes, provided   Follow up plan: No further intervention required. Pending re assignment of RN CM     Encounter Outcome:  Patient Visit Completed     Cala Bradford L. Noelle Penner, RN, BSN, CCM [place]  [facility], [facility] RN Care Manager Direct Dial: [phone]  Fax: [phone] Mailing Address: [address]  [place] [address] Website: [email or web address]

## 2023-11-04 ENCOUNTER — Encounter: Payer: Medicare HMO | Admitting: *Deleted

## 2023-11-14 DIAGNOSIS — Z79891 Long term (current) use of opiate analgesic: Secondary | ICD-10-CM | POA: Diagnosis not present

## 2023-11-15 ENCOUNTER — Telehealth: Payer: Self-pay

## 2023-11-15 DIAGNOSIS — M47816 Spondylosis without myelopathy or radiculopathy, lumbar region: Secondary | ICD-10-CM | POA: Diagnosis not present

## 2023-11-15 DIAGNOSIS — Z79891 Long term (current) use of opiate analgesic: Secondary | ICD-10-CM | POA: Diagnosis not present

## 2023-11-15 DIAGNOSIS — G894 Chronic pain syndrome: Secondary | ICD-10-CM | POA: Diagnosis not present

## 2023-11-15 DIAGNOSIS — M545 Low back pain, unspecified: Secondary | ICD-10-CM | POA: Diagnosis not present

## 2023-11-15 NOTE — Telephone Encounter (Signed)
 Copied from CRM [id number]. Topic: Appointments - Scheduling Inquiry for Clinic >> Nov 15, 2023  8:24 AM Izetta Dakin wrote: Reason for CRM: Patient is requesting no visitors during her appointment on tomorrow, 11/16/23. She ask that this be done discreetly so that her grandson is not offended.

## 2023-11-16 ENCOUNTER — Ambulatory Visit: Payer: Medicare HMO | Admitting: Family Medicine

## 2023-12-02 ENCOUNTER — Encounter: Payer: Self-pay | Admitting: Cardiovascular Disease

## 2023-12-02 ENCOUNTER — Ambulatory Visit: Payer: Medicare HMO | Attending: Cardiovascular Disease | Admitting: Cardiovascular Disease

## 2023-12-02 VITALS — BP 147/86 | HR 70 | Ht 63.0 in | Wt 140.0 lb

## 2023-12-02 DIAGNOSIS — I10 Essential (primary) hypertension: Secondary | ICD-10-CM

## 2023-12-02 DIAGNOSIS — E7800 Pure hypercholesterolemia, unspecified: Secondary | ICD-10-CM

## 2023-12-02 DIAGNOSIS — I25118 Atherosclerotic heart disease of native coronary artery with other forms of angina pectoris: Secondary | ICD-10-CM

## 2023-12-02 DIAGNOSIS — I201 Angina pectoris with documented spasm: Secondary | ICD-10-CM

## 2023-12-02 MED ORDER — ROSUVASTATIN CALCIUM 10 MG PO TABS: 10.0000 mg | ORAL_TABLET | Freq: Every day | ORAL | 3 refills | Status: AC

## 2023-12-02 MED ORDER — METOPROLOL SUCCINATE ER 25 MG PO TB24: 25.0000 mg | ORAL_TABLET | Freq: Every day | ORAL | 3 refills | Status: AC

## 2023-12-02 NOTE — Patient Instructions (Signed)
 Medication Instructions:  STOP Metoprolol Tartrate  START Metoprolol Succinate 25 mg daily START Rosuvastatin 10 mg daily *If you need a refill on your cardiac medications before your next appointment, please call your pharmacy*  Keep a daily log of your BP and send in 2 months  Lab Work: Lipid panel- with your PCP in 2 months- Call Dr Royann Shivers when your labs are done If you have labs (blood work) drawn today and your tests are completely normal, you will receive your results only by: MyChart Message (if you have MyChart) OR A paper copy in the mail If you have any lab test that is abnormal or we need to change your treatment, we will call you to review the results.   Follow-Up: At [facility], you and your health needs are our priority.  As part of our continuing mission to provide you with exceptional heart care, our providers are all part of one team.  This team includes your primary Cardiologist (physician) and Advanced Practice Providers or APPs (Physician Assistants and Nurse Practitioners) who all work together to provide you with the care you need, when you need it.  Your next appointment:   1 year(s)  Provider:   Thurmon Fair, MD     We recommend signing up for the patient portal called "MyChart".  Sign up information is provided on this After Visit Summary.  MyChart is used to connect with patients for Virtual Visits (Telemedicine).  Patients are able to view lab/test results, encounter notes, upcoming appointments, etc.  Non-urgent messages can be sent to your provider as well.   To learn more about what you can do with MyChart, go to [email or web address].        1st Floor: - Lobby - Registration  - Pharmacy  - Lab - Cafe  2nd Floor: - PV Lab - Diagnostic Testing (echo, CT, nuclear med)  3rd Floor: - Vacant  4th Floor: - TCTS (cardiothoracic surgery) - AFib Clinic - Structural Heart Clinic - Vascular Surgery  - Vascular Ultrasound  5th  Floor: - HeartCare Cardiology (general and EP) - Clinical Pharmacy for coumadin, hypertension, lipid, weight-loss medications, and med management appointments    Valet parking services will be available as well.

## 2023-12-02 NOTE — Progress Notes (Signed)
 Patient ID: [name], female   DOB: [birth year], 89 y.o.   MRN: [id number]      Cardiology Office Note    Date:  12/10/2023   ID:  [name], DOB Feb 25, 1934, MRN [id number]  PCP:  [name], MD  Cardiologist:   Thurmon Fair, MD   Chief Complaint  Patient presents with   Coronary Artery Disease    History of Present Illness:  [name] is a 89 y.o. female with intermittent chest discomfort suggestive of angina pectoris, mild scattered CAD by coronary angiography in 2017, possible coronary spasm, hyperlipidemia (on atorvastatin), borderline diabetes mellitus, hypertension, quit smoking 30 years ago.  After a tick bite in March 2021, she developed alpha-gal sensitization.  The patient specifically denies any chest pain at rest or with exertion, dyspnea at rest or with exertion, orthopnea, paroxysmal nocturnal dyspnea, syncope, palpitations, focal neurological deficits, intermittent claudication, lower extremity edema, unexplained weight gain, cough, hemoptysis or wheezing. She has not had angina pectoris since her last appointment.  Blood pressure was initially quite high at 160/80 and even when rechecked it was still high at 147/90.  Last December her blood pressure was also elevated at 157/114 at an office visit.  Her metabolic profile is not great.  LDL cholesterol 121, although she has normal HDL and triglyceride levels.  She does not have diabetes mellitus.  Renal function is normal.   Past Medical History:  Diagnosis Date   Anemia    Blood clot associated with vein wall inflammation    hx of in right leg   CAD (coronary artery disease)    Carpal tunnel syndrome on left    Left Hand pain with numbness, new onset    Chronic back pain    Chronic back pain    buldging disc   Constipation    Diarrhea    Difficult intubation    small airway   DJD (degenerative joint disease)    Severe   Gastric ulcer    hx of    GERD (gastroesophageal reflux  disease)    takes Omeprazole daily   Headache(784.0)    occasionally   Hyperlipidemia    takes Lipitor nightly   Hypertension    takes Amlodipine and Metoprolol daily   Nocturia    Obesity    Pneumonia    hx of-in 2011   Shortness of breath    with exertion   Sleep apnea    sleep study done 45yrs ago   Thyroid mass    right    Past Surgical History:  Procedure Laterality Date   ABDOMINAL HYSTERECTOMY     APPENDECTOMY  1987   Arthoscopy left knee  1992   Arthroscopy left shoulder  1999   Back  Surgery  lumbar  1989 /1999   x 2   BACK SURGERY     CARDIAC CATHETERIZATION  05/2010   CARDIAC CATHETERIZATION N/A 09/30/2015   Procedure: Left Heart Cath and Coronary Angiography;  Surgeon: Lennette Bihari, MD;  Location: [facility];  Service: Cardiovascular;  Laterality: N/A;   Carpal tunnel release right     x 2   CHOLECYSTECTOMY     COLONOSCOPY     ESOPHAGOGASTRODUODENOSCOPY     LUMBAR FUSION  March 19, 2014   NM MYOCAR PERF WALL MOTION  11/12/2008   no ischemia   PARTIAL HYSTERECTOMY  1972   ROTATOR CUFF REPAIR     right   SPINE SURGERY     THYROIDECTOMY  11/17/2011   Procedure: THYROIDECTOMY;  Surgeon: Darletta Moll, MD;  Location: [facility] OR;  Service: ENT;  Laterality: Right;  WITH FROZEN SECTION   TONSILLECTOMY      Outpatient Medications Prior to Visit  Medication Sig Dispense Refill   amLODipine (NORVASC) 10 MG tablet TAKE 1 TABLET(10 MG) BY MOUTH DAILY 90 tablet 3   Ascorbic Acid (VITAMIN C) 500 MG tablet Take 500 mg by mouth daily.     aspirin EC 81 MG tablet Take 81 mg by mouth daily.     calcium carbonate (OS-CAL - DOSED IN MG OF ELEMENTAL CALCIUM) 1250 MG tablet Take 1 tablet by mouth daily.     Cyanocobalamin (B-12) 1000 MCG CAPS Take 1 capsule by mouth daily.     ezetimibe (ZETIA) 10 MG tablet TAKE 1 TABLET(10 MG) BY MOUTH DAILY 90 tablet 1   fluticasone (FLONASE) 50 MCG/ACT nasal spray SHAKE LIQUID AND USE 1 SPRAY IN EACH NOSTRIL DAILY 16 g 3    hydrochlorothiazide (HYDRODIURIL) 25 MG tablet Take 1 tablet (25 mg total) by mouth daily. 30 tablet 5   HYDROcodone-acetaminophen (NORCO) 10-325 MG tablet Take 1 tablet by mouth every 6 (six) hours as needed for moderate pain.     isosorbide mononitrate (IMDUR) 30 MG 24 hr tablet Take 1 tablet (30 mg total) by mouth daily. 90 tablet 3   levothyroxine (SYNTHROID) 25 MCG tablet TAKE 1 TABLET BY MOUTH ON MONDAY THROUGH THURSDAY. TAKE 1 1/2 TABLET ON FRIDAY, SATURDAY AND SUNDAY. 110 tablet 3   Multiple Vitamins-Minerals (CENTRUM SILVER 50+WOMEN) TABS Take 1 tablet by mouth.     potassium gluconate 595 (99 K) MG TABS tablet Take 1 tablet (595 mg total) by mouth every evening. 30 tablet 1   sertraline (ZOLOFT) 25 MG tablet Take 1 tablet (25 mg total) by mouth daily. 30 tablet 3   METOPROLOL TARTRATE PO Take 1 tablet by mouth daily.     diclofenac sodium (VOLTAREN) 1 % GEL Apply 2 g topically 2 (two) times daily as needed (for back pain). (Patient not taking: Reported on 12/02/2023)     EPINEPHrine 0.3 mg/0.3 mL IJ SOAJ injection INJECT INTO THE MUSCLE AS NEEDED (Patient not taking: Reported on 12/02/2023) 2 each 1   nitroGLYCERIN (NITROLINGUAL) 0.4 MG/SPRAY spray Place 1 spray under the tongue every 5 (five) minutes x 3 doses as needed for chest pain. (Patient not taking: Reported on 12/02/2023) 12 g 0   No facility-administered medications prior to visit.     Allergies:   Ace inhibitors, Beef-derived drug products, Bupropion hcl, Codeine, Penicillins, Angiotensin receptor blockers, Bee venom, Lambs quarters, and Pork-derived products   Family History:  The patient's family history includes Anuerysm in her mother; Stroke in her father.   ]  PHYSICAL EXAM:   VS:  BP (!) 147/86   Pulse 70   Ht 5\' 3"  (1.6 m)   Wt 140 lb (63.5 kg)   SpO2 93%   BMI 24.80 kg/m      General: Alert, oriented x3, no distress, appears lean, younger than stated age Head: no evidence of trauma, PERRL, EOMI, no  exophtalmos or lid lag, no myxedema, no xanthelasma; normal ears, nose and oropharynx Neck: normal jugular venous pulsations and no hepatojugular reflux; brisk carotid pulses without delay and no carotid bruits Chest: clear to auscultation, no signs of consolidation by percussion or palpation, normal fremitus, symmetrical and full respiratory excursions Cardiovascular: normal position and quality of the apical impulse, regular rhythm, normal first and  second heart sounds, no murmurs, rubs or gallops Abdomen: no tenderness or distention, no masses by palpation, no abnormal pulsatility or arterial bruits, normal bowel sounds, no hepatosplenomegaly Extremities: no clubbing, cyanosis or edema; 2+ radial, ulnar and brachial pulses bilaterally; 2+ right femoral, posterior tibial and dorsalis pedis pulses; 2+ left femoral, posterior tibial and dorsalis pedis pulses; no subclavian or femoral bruits Neurological: grossly nonfocal Psych: Normal mood and affect    Wt Readings from Last 3 Encounters:  12/02/23 140 lb (63.5 kg)  08/19/23 135 lb (61.2 kg)  05/10/23 137 lb 1.9 oz (62.2 kg)      Studies/Labs Reviewed:   EKG:  EKG is ordered today.  It shows NSR with a single PAC, otherwise normal  EKG Interpretation Date/Time:  Friday December 02 2023 09:00:10 EDT Ventricular Rate:  70 PR Interval:  168 QRS Duration:  80 QT Interval:  394 QTC Calculation: 425 R Axis:   -8  Text Interpretation: Normal sinus rhythm Normal ECG When compared with ECG of 02-Feb-2019 21:30, PREVIOUS ECG IS PRESENT Confirmed by Piper Albro [phone]) on 12/02/2023 9:24:54 AM         Recent Labs: 05/10/2023: ALT 11; BUN 16; Creatinine, Ser 1.02; Potassium 4.5; Sodium 138; TSH 5.450   Lipid Panel    Component Value Date/Time   CHOL 194 12/10/2022 1030   TRIG 106 12/10/2022 1030   HDL 54 12/10/2022 1030   CHOLHDL 3.6 12/10/2022 1030   CHOLHDL 4.3 05/29/2020 1428   VLDL 34 (H) 05/03/2017 1047   LDLCALC 121 (H)  12/10/2022 1030   LDLCALC 112 (H) 05/29/2020 1428    ASSESSMENT:    1. Atherosclerosis of native coronary artery of native heart with other form of angina pectoris (HCC)   2. Coronary vasospasm (HCC)   3. Essential hypertension   4. Pure hypercholesterolemia      PLAN:  In order of problems listed above:  Coronary atherosclerosis: There were no severe stenoses at cardiac catheterization.  On aspirin and beta-blocker, as well as amlodipine and long-acting nitrates for presumed coronary vasospasm.  Lipid-lowering therapy is insufficient.  She is taking metoprolol tartrate only once daily. Possible vasospastic angina: Asymptomatic.  Currently well controlled on two vasodilator medications (calcium channel blocker and long-acting nitrate). HTN: Blood pressure is unexpectedly high.  Need to clarify whether she is truly taking amlodipine 10 mg daily, hydrochlorothiazide 25 mg daily and metoprolol tartrate 25 mg daily as well as isosorbide. HLP: The atorvastatin is no longer on her list.  May have had some side effects?  She is only taking ezetimibe it seems.  Not surprisingly her LDL cholesterol is too high.  Will add rosuvastatin today.  Recheck a lipid profile in a couple of months (she is going to have that done with Dr. Lodema Hong). Alpha-gal sensitization: Continues to carefully avoid red meat.  medicines are reviewed at length with the patient today.  Concerns regarding medicines are outlined above.  Medication changes, Labs and Tests ordered today are listed in the Patient Instructions below. Patient Instructions  Medication Instructions:  STOP Metoprolol Tartrate  START Metoprolol Succinate 25 mg daily START Rosuvastatin 10 mg daily *If you need a refill on your cardiac medications before your next appointment, please call your pharmacy*  Keep a daily log of your BP and send in 2 months  Lab Work: Lipid panel- with your PCP in 2 months- Call Dr Royann Shivers when your labs are done If  you have labs (blood work) drawn today and your tests are  completely normal, you will receive your results only by: MyChart Message (if you have MyChart) OR A paper copy in the mail If you have any lab test that is abnormal or we need to change your treatment, we will call you to review the results.   Follow-Up: At [facility], you and your health needs are our priority.  As part of our continuing mission to provide you with exceptional heart care, our providers are all part of one team.  This team includes your primary Cardiologist (physician) and Advanced Practice Providers or APPs (Physician Assistants and Nurse Practitioners) who all work together to provide you with the care you need, when you need it.  Your next appointment:   1 year(s)  Provider:   Thurmon Fair, MD     We recommend signing up for the patient portal called "MyChart".  Sign up information is provided on this After Visit Summary.  MyChart is used to connect with patients for Virtual Visits (Telemedicine).  Patients are able to view lab/test results, encounter notes, upcoming appointments, etc.  Non-urgent messages can be sent to your provider as well.   To learn more about what you can do with MyChart, go to [email or web address].        1st Floor: - Lobby - Registration  - Pharmacy  - Lab - Cafe  2nd Floor: - PV Lab - Diagnostic Testing (echo, CT, nuclear med)  3rd Floor: - Vacant  4th Floor: - TCTS (cardiothoracic surgery) - AFib Clinic - Structural Heart Clinic - Vascular Surgery  - Vascular Ultrasound  5th Floor: - HeartCare Cardiology (general and EP) - Clinical Pharmacy for coumadin, hypertension, lipid, weight-loss medications, and med management appointments    Valet parking services will be available as well.        Signed, Thurmon Fair, MD  12/10/2023 9:04 PM    [facility] [address] [place], [place], [place]  [address] Phone: [phone]; Fax:  [phone]

## 2023-12-10 NOTE — Patient Instructions (Addendum)
 Visit Information  Thank you for taking time to visit with me today. Please don't hesitate to contact me if I can be of assistance to you.   Following are the goals we discussed today:     Goals Addressed             This Visit's Progress    Safe home management related to confusion/memory concern & pain management - [facility] care coordination       Patient with receive outreaches from [facility]I SW & RN CM- 11/03/23 remains active Patient will attend all medical appointments 11/03/23 on track Patient will take medications as ordered -11/03/23 on track  Patient can be assisted with pill packaging plus caregiver assist to be provided to daughter, Phyliss- pending   Interventions Today    Flowsheet Row Most Recent Value  Chronic Disease   Chronic disease during today's visit Other, Hypertension (HTN)  remains active with /SW- reviewed previous trip,Review aging gracefully program option]  General Interventions   General Interventions Discussed/Reviewed General Interventions Reviewed, Durable Medical Equipment (DME), Doctor Visits, Communication with  Doctor Visits Discussed/Reviewed Doctor Visits Reviewed, PCP, Specialist  Durable Medical Equipment (DME) Other, Walker  [uses cane mostly for ambulation]  PCP/Specialist Visits Compliance with follow-up visit  Communication with PCP/Specialists, Social Work  Exercise Interventions   Exercise Discussed/Reviewed Exercise Reviewed, Physical Activity  [confirm remaining active- walks to mail box using cane & throughout home]  Physical Activity Discussed/Reviewed Physical Activity Reviewed, Types of exercise  [walking]  Education Interventions   Education Provided Provided Therapist, sports, Provided Web-based Education, Provided Education  Special educational needs teacher Education On Other, When to see the doctor, Medication, Exercise, Mental Health/Coping with Illness  [Aging gracefully]  Mental Health Interventions   Mental Health  Discussed/Reviewed Mental Health Reviewed, Coping Strategies, Grief and Loss, Depression  [support provided for her confusion today, loss of her friend, Encouraged her to continue to be active with Nelida Gores, SW]  Pharmacy Interventions   Pharmacy Dicussed/Reviewed Pharmacy Topics Reviewed, Affording Medications, Medications and their functions  Safety Interventions   Safety Discussed/Reviewed Safety Reviewed, Home Safety, Fall Risk  [discussed safety for fall risks with walking outside, inquired about interest in relocating to [facility] - not ready]  Home Safety Assistive Devices            Our next appointment is no further scheduled appointments.  Pending re assignment details for RN CM   on - at -  Please call the care guide team at [phone] if you need to cancel or reschedule your appointment.   If you are experiencing a Mental Health or Behavioral Health Crisis or need someone to talk to, please call the Suicide and Crisis Lifeline: 988 call the [place] National Suicide Prevention Lifeline: (769)094-1043 or TTY: 8437403767 TTY 3156386533) to talk to a trained counselor call 1-800-273-TALK (toll free, 24 hour hotline) call the [facility]: (984)078-3576 call 911   Patient verbalizes understanding of instructions and care plan provided today and agrees to view in MyChart. Active MyChart status and patient understanding of how to access instructions and care plan via MyChart confirmed with patient.     The patient has been provided with contact information for the care management team and has been advised to call with any health related questions or concerns.    Antavion Bartoszek L. Noelle Penner, RN, BSN, CCM [place]  [facility], [facility] RN Care Manager Direct Dial: [phone]  Fax: [phone]

## 2023-12-13 DIAGNOSIS — G894 Chronic pain syndrome: Secondary | ICD-10-CM | POA: Diagnosis not present

## 2023-12-13 DIAGNOSIS — M545 Low back pain, unspecified: Secondary | ICD-10-CM | POA: Diagnosis not present

## 2023-12-13 DIAGNOSIS — M47816 Spondylosis without myelopathy or radiculopathy, lumbar region: Secondary | ICD-10-CM | POA: Diagnosis not present

## 2023-12-13 DIAGNOSIS — Z79891 Long term (current) use of opiate analgesic: Secondary | ICD-10-CM | POA: Diagnosis not present

## 2023-12-15 ENCOUNTER — Telehealth: Payer: Self-pay

## 2023-12-15 NOTE — Telephone Encounter (Signed)
 Copied from CRM [id number]. Topic: Referral - Prior Authorization Question >> Dec 15, 2023 12:03 PM DeAngela L wrote: Reason for CRM: Loraine Leriche with [facility] department calling to ask if the office received a prior auth fax that needs to be signed (right lumber & right knee) Call back number is [phone]

## 2023-12-16 NOTE — Telephone Encounter (Signed)
 Form put up to provider, will fax when completed. Mark informed

## 2023-12-20 ENCOUNTER — Telehealth: Payer: Self-pay | Admitting: Family Medicine

## 2023-12-20 NOTE — Telephone Encounter (Signed)
 Copied from CRM [id number]. Topic: General - Other >> Dec 20, 2023  2:21 PM Tiffany S wrote: Reason for CRM: Lavonia Powers from [facility] checking the status of paperwork that was sent over on 4/10 please follow up  [phone]

## 2023-12-21 ENCOUNTER — Telehealth: Payer: Self-pay | Admitting: Family Medicine

## 2023-12-21 NOTE — Telephone Encounter (Signed)
 Copied from CRM [id number]. Topic: General - Other >> Dec 21, 2023  4:23 PM Donald Frost wrote: Reason for CRM: Marjo Sievert with [facility] in [place] he says but was kind of sketchy as he said [place] and was not very clear at first called in stating he sent 2 previous faxed request concerning a blood pressure monitor for the patient. After speaking with Caelyn I was told it was best to send this so it can be sent to the prior authorization team for review. Noah's number in [place] is [phone]. Please assist further

## 2023-12-21 NOTE — Telephone Encounter (Signed)
 We do not prescribe or complete paperwork for orthopedic braces.

## 2023-12-22 NOTE — Telephone Encounter (Signed)
 Marjo Sievert called back from [facility] pharmacy his phone # [phone] When completed fax back [phone]

## 2023-12-22 NOTE — Telephone Encounter (Signed)
 If the fax we have is from [facility] for the monitor, we will complete and fax back in for her.

## 2023-12-23 NOTE — Telephone Encounter (Signed)
 Pls let him know if he calls back again that the provider will not be back in the office until 4/29 and this will be addressed at that time

## 2023-12-27 NOTE — Telephone Encounter (Signed)
 [name] w/ [facility] calling to check on status of paperwork.   Advised of [name]'s message.   Requesting forms to be faxed back w/ statement that office does not prescribe or complete paperwork for orthopedic braces.  Fax number: [phone]

## 2023-12-27 NOTE — Telephone Encounter (Signed)
 Copied from CRM [id number]. Topic: Medical Record Request - Other >> Dec 27, 2023  3:13 PM Baldomero Bone wrote: Reason for CRM: Marjo Sievert with [facility] asked if another provider can complete the paperwork when advised of the note about the provider returning to the office on 01/03/2024. Callback number is [phone]

## 2023-12-28 NOTE — Telephone Encounter (Signed)
 It will be signed once the patient agrees to it

## 2023-12-28 NOTE — Telephone Encounter (Signed)
 Faxed back that we do not write orders for braces

## 2024-01-03 ENCOUNTER — Telehealth: Payer: Self-pay | Admitting: Family Medicine

## 2024-01-03 NOTE — Telephone Encounter (Signed)
 Received call from Patient to [facility] and she stated that she was trying to get in touch with her pcp I will route this to Dr. Rodolph Clap

## 2024-01-03 NOTE — Telephone Encounter (Signed)
 Spoke to pt daughter and discussed

## 2024-01-03 NOTE — Telephone Encounter (Signed)
 Pts daughter called gna again to my private line and I have not called this pt. They would like Dr. Rodolph Clap to call her directly to clear up why they seem to have received a call from [phone] when I have not called the pt and she has not been seen in our office

## 2024-01-03 NOTE — Telephone Encounter (Signed)
 Copied from CRM [id number]. Topic: General - Other >> Jan 03, 2024  2:50 PM Alpha Arts wrote: Reason for CRM: Patient was calling in to let Dr. Rodolph Clap know Margaretann Sharper (another patient and also her aunt) has passed away.  Callback #: [phone]

## 2024-01-04 ENCOUNTER — Telehealth: Payer: Self-pay

## 2024-01-04 NOTE — Telephone Encounter (Signed)
Pt informed

## 2024-01-04 NOTE — Telephone Encounter (Signed)
 No problem. Faxed additional ov notes needed for bp monitor

## 2024-01-04 NOTE — Telephone Encounter (Signed)
 Copied from CRM [id number]. Topic: Medical Record Request - Provider/Facility Request >> Jan 04, 2024 10:15 AM Carlatta H wrote: Reason for CRM: Patients chart notes are requested fax to [phone]

## 2024-01-09 ENCOUNTER — Encounter: Payer: Self-pay | Admitting: Family Medicine

## 2024-01-10 ENCOUNTER — Encounter

## 2024-01-11 ENCOUNTER — Encounter: Payer: Self-pay | Admitting: Family Medicine

## 2024-01-11 ENCOUNTER — Telehealth: Payer: Self-pay | Admitting: Family Medicine

## 2024-01-11 ENCOUNTER — Ambulatory Visit (INDEPENDENT_AMBULATORY_CARE_PROVIDER_SITE_OTHER): Admitting: Family Medicine

## 2024-01-11 VITALS — BP 127/80 | HR 79 | Resp 16 | Ht 63.0 in | Wt 135.1 lb

## 2024-01-11 DIAGNOSIS — R768 Other specified abnormal immunological findings in serum: Secondary | ICD-10-CM | POA: Diagnosis not present

## 2024-01-11 DIAGNOSIS — Z91148 Patient's other noncompliance with medication regimen for other reason: Secondary | ICD-10-CM

## 2024-01-11 DIAGNOSIS — Z789 Other specified health status: Secondary | ICD-10-CM

## 2024-01-11 DIAGNOSIS — E039 Hypothyroidism, unspecified: Secondary | ICD-10-CM

## 2024-01-11 DIAGNOSIS — M5440 Lumbago with sciatica, unspecified side: Secondary | ICD-10-CM

## 2024-01-11 DIAGNOSIS — I10 Essential (primary) hypertension: Secondary | ICD-10-CM

## 2024-01-11 DIAGNOSIS — R6889 Other general symptoms and signs: Secondary | ICD-10-CM

## 2024-01-11 DIAGNOSIS — R7401 Elevation of levels of liver transaminase levels: Secondary | ICD-10-CM

## 2024-01-11 DIAGNOSIS — F4321 Adjustment disorder with depressed mood: Secondary | ICD-10-CM

## 2024-01-11 DIAGNOSIS — E7800 Pure hypercholesterolemia, unspecified: Secondary | ICD-10-CM

## 2024-01-11 NOTE — Telephone Encounter (Signed)
 Dr. Rodolph Clap did receive message and pt was notified and given condolences

## 2024-01-11 NOTE — Telephone Encounter (Signed)
 Copied from CRM [id number]. Topic: General - Other >> Jan 10, 2024  5:34 PM Ameerah G wrote: Reason for CRM: Patient had a recent loss and not sure if MD simpson got the message.

## 2024-01-11 NOTE — Patient Instructions (Addendum)
 F/U in 8 weeks, call if you need me sooner  Nurse BP check in 1 week  Please discard all pills that arwe over 12 months old  Fasting labs this week  ONLY medications to take rigth now  Imdur  one daily  Metoprolol  succinate ( dr C)  Rosuvastatin  ( Dr C)  Pain med  Pls get fasting labs already ordered this week  Brain scan recoommmended and you are giving this consideration  Sincere condolence and prayers re recent loss   Thanks for choosing [facility], we consider it a privelige to serve you.

## 2024-01-12 DIAGNOSIS — E039 Hypothyroidism, unspecified: Secondary | ICD-10-CM | POA: Diagnosis not present

## 2024-01-12 DIAGNOSIS — R7989 Other specified abnormal findings of blood chemistry: Secondary | ICD-10-CM | POA: Diagnosis not present

## 2024-01-12 DIAGNOSIS — E785 Hyperlipidemia, unspecified: Secondary | ICD-10-CM | POA: Diagnosis not present

## 2024-01-12 DIAGNOSIS — I10 Essential (primary) hypertension: Secondary | ICD-10-CM | POA: Diagnosis not present

## 2024-01-12 DIAGNOSIS — D511 Vitamin B12 deficiency anemia due to selective vitamin B12 malabsorption with proteinuria: Secondary | ICD-10-CM | POA: Diagnosis not present

## 2024-01-12 DIAGNOSIS — E559 Vitamin D deficiency, unspecified: Secondary | ICD-10-CM | POA: Diagnosis not present

## 2024-01-13 ENCOUNTER — Other Ambulatory Visit: Payer: Self-pay

## 2024-01-13 ENCOUNTER — Encounter: Payer: Self-pay | Admitting: Family Medicine

## 2024-01-13 DIAGNOSIS — R7989 Other specified abnormal findings of blood chemistry: Secondary | ICD-10-CM

## 2024-01-13 LAB — LIPID PANEL
Chol/HDL Ratio: 2.7 ratio (ref 0.0–4.4)
Cholesterol, Total: 155 mg/dL (ref 100–199)
HDL: 57 mg/dL (ref >39)
LDL Chol Calc (NIH): 78 mg/dL (ref 0–99)
Triglycerides: 111 mg/dL (ref 0–149)
VLDL Cholesterol Cal: 20 mg/dL (ref 5–40)

## 2024-01-13 LAB — CBC
Hematocrit: 39.8 % (ref 34.0–46.6)
Hemoglobin: 12.6 g/dL (ref 11.1–15.9)
MCH: 28.9 pg (ref 26.6–33.0)
MCHC: 31.7 g/dL (ref 31.5–35.7)
MCV: 91 fL (ref 79–97)
Platelets: 235 10*3/uL (ref 150–450)
RBC: 4.36 x10E6/uL (ref 3.77–5.28)
RDW: 13 % (ref 11.7–15.4)
WBC: 4 10*3/uL (ref 3.4–10.8)

## 2024-01-13 LAB — VITAMIN D 25 HYDROXY (VIT D DEFICIENCY, FRACTURES): Vit D, 25-Hydroxy: 32.8 ng/mL (ref 30.0–100.0)

## 2024-01-13 LAB — CMP14+EGFR
ALT: 35 IU/L — ABNORMAL HIGH (ref 0–32)
AST: 79 IU/L — ABNORMAL HIGH (ref 0–40)
Albumin: 4.3 g/dL (ref 3.7–4.7)
Alkaline Phosphatase: 145 IU/L — ABNORMAL HIGH (ref 44–121)
BUN/Creatinine Ratio: 10 — ABNORMAL LOW (ref 12–28)
BUN: 11 mg/dL (ref 8–27)
Bilirubin Total: 0.5 mg/dL (ref 0.0–1.2)
CO2: 25 mmol/L (ref 20–29)
Calcium: 9.5 mg/dL (ref 8.7–10.3)
Chloride: 98 mmol/L (ref 96–106)
Creatinine, Ser: 1.08 mg/dL — ABNORMAL HIGH (ref 0.57–1.00)
Globulin, Total: 2.6 g/dL (ref 1.5–4.5)
Glucose: 89 mg/dL (ref 70–99)
Potassium: 4.9 mmol/L (ref 3.5–5.2)
Sodium: 137 mmol/L (ref 134–144)
Total Protein: 6.9 g/dL (ref 6.0–8.5)
eGFR: 50 mL/min/{1.73_m2} — ABNORMAL LOW (ref >59)

## 2024-01-13 LAB — VITAMIN B12: Vitamin B-12: 477 pg/mL (ref 232–1245)

## 2024-01-13 LAB — TSH: TSH: 3.78 u[IU]/mL (ref 0.450–4.500)

## 2024-01-16 ENCOUNTER — Encounter (INDEPENDENT_AMBULATORY_CARE_PROVIDER_SITE_OTHER): Payer: Self-pay | Admitting: *Deleted

## 2024-01-16 DIAGNOSIS — R7401 Elevation of levels of liver transaminase levels: Secondary | ICD-10-CM | POA: Insufficient documentation

## 2024-01-16 DIAGNOSIS — R768 Other specified abnormal immunological findings in serum: Secondary | ICD-10-CM | POA: Insufficient documentation

## 2024-01-16 DIAGNOSIS — Z789 Other specified health status: Secondary | ICD-10-CM | POA: Insufficient documentation

## 2024-01-16 DIAGNOSIS — Z91148 Patient's other noncompliance with medication regimen for other reason: Secondary | ICD-10-CM | POA: Insufficient documentation

## 2024-01-16 NOTE — Progress Notes (Signed)
 [name]     MRN: [id number]      DOB: 1934-04-20  Chief Complaint  Patient presents with   Medical Management of Chronic Issues    8 week follow up     HPI [name] is here for follow up and re-evaluation of chronic medical conditions, medication management and review of any available recent lab and radiology data.  Currently grieving recent loss of her Aunt who she describes as her rock Still very convinced that her daughter and [name] who seem to be the most involved in her care are trying to take away her independnce and from report of daughter there is conflict and dissent amongst the siblings re Mother, which complicates the situation. [name] has 4 daughters, 3 of which are capable of  being involved in there care , the 4th has severe debilitating mental health illness which is untreated . Medications she states she is taking but is unable to report how consistently are brought in , many f which are expired or dupkicated. Will simplify list to 3 tabs only , 2 by Cardiology specifically with close f/u to re assess  BP, at visit today the BP is controlled but she is incapable of accurately reporting what she is taking and though Grandson attempts to help her with this she continues to block/ resist the help. Plan is to pill pack once we can establish what she is actually taking Report from daughter directly prior to visit and also review of written message from grandson reports increasingly aggressive behavior and ongoinfg real concern and appropriately so for patiemt's safety and the fact that she incapable of the indepence she continues t fight for  ROS Denies recent fever or chills. Denies sinus pressure, nasal congestion, ear pain or sore throat. Denies chest congestion, productive cough or wheezing. Denies chest pains, palpitations and leg swelling Denies abdominal pain, nausea, vomiting,diarrhea or constipation.   Denies dysuria, frequency, hesitancy or  incontinence. Chronic  joint pain, and limitation in mobility.managed by pain clinic Denies headaches, seizures Denies skin break down or rash.   PE  BP 127/80   Pulse 79   Resp 16   Ht 5\' 3"  (1.6 m)   Wt 135 lb 1.3 oz (61.3 kg)   SpO2 95%   BMI 23.93 kg/m   Patient alert and oriented and in no cardiopulmonary distress.  HEENT: No facial asymmetry, EOMI,     Neck decreased ROM .  Chest: Clear to auscultation bilaterally.  CVS: S1, S2 no murmurs, no S3.Regular rate.  ABD: Soft non tender.   Ext: No edema  MS: decreased  ROM spine, shoulders, hips and knees.  Skin: Intact, no ulcerations or rash noted. Psych: memory Impaired both  anxious oand depressed appearing.at times, tearful  CNS: CN 2-12 intact, power,  normal throughout.no focal deficits noted.   Assessment & Plan  Hepatitis B core antibody positive New dx refer to GI for eval and management asap  Forgetfulness Though pt agrees that she I forgetful and that se has required assistance from family esp daughter [name] to keep her finances straight as she has been a victim of finanacial scam , she remains reticent in refusing the oversight needed by her daughter and grandson, social work and case management are already involved  Hypothyroid Updated lab shows control, it is unclear what pt is taking as she cannot recall, med bottles are confused and out dated , impossible for her grandson currnetly to manage her meds, he does purchase  them as she refuses  Hyperlipidemia Hyperlipidemia:Low fat diet discussed and encouraged.   Lipid Panel  Lab Results  Component Value Date   CHOL 155 01/12/2024   HDL 57 01/12/2024   LDLCALC 78 01/12/2024   TRIG 111 01/12/2024   CHOLHDL 2.7 01/12/2024     Adequatelyu contolle continue crestor  per Cardiology  Essential hypertension DASH diet and commitment to daily physical activity for a minimum of 30 minutes discussed and encouraged, as a part of hypertension  management. The importance of attaining a healthy weight is also discussed.     01/11/2024    4:18 PM 12/02/2023    9:38 AM 12/02/2023    8:56 AM 08/19/2023    3:00 PM 08/19/2023    2:04 PM 08/19/2023    2:01 PM 05/10/2023    2:14 PM  BP/Weight  Systolic BP 127 147 160 148 157 173 170  Diastolic BP 80 86 80 88 114 105 90  Wt. (Lbs) 135.08  140   135   BMI 23.93 kg/m2  24.8 kg/m2   23.91 kg/m2      Controlled at visit, pt incapable of reporting the med she self administers, will narrowmeds down to 2 and have nurse visit to re assess in 1 week  Back pain of lumbar region with sciatica Chronic pain management through pain clinic  Grief Recent loss of only living Aunt has pt appropriately grieving and her depression is curently uncontrolled, she is not suicidal or homicidal, no interest in therapy, has been  on anti depressant in the past , holding off on this now until med loist can be clarified and update and accurate

## 2024-01-16 NOTE — Assessment & Plan Note (Signed)
 Hyperlipidemia:Low fat diet discussed and encouraged.   Lipid Panel  Lab Results  Component Value Date   CHOL 155 01/12/2024   HDL 57 01/12/2024   LDLCALC 78 01/12/2024   TRIG 111 01/12/2024   CHOLHDL 2.7 01/12/2024     Adequatelyu contolle continue crestor  per Cardiology

## 2024-01-16 NOTE — Assessment & Plan Note (Signed)
 Updated lab shows control, it is unclear what pt is taking as she cannot recall, med bottles are confused and out dated , impossible for her grandson currnetly to manage her meds, he does purchase them as she refuses

## 2024-01-16 NOTE — Assessment & Plan Note (Signed)
 Clearly demonstrated at the visit is the fact that pt ois incapable of being responsible for safe and consistent self administration of her meds , she also has complex medical history whoich involves needing more than 5 prescription nmeds daily will refer to [facility] again, she resists family assistance, plan is to pill pack her meds once regime is established

## 2024-01-16 NOTE — Assessment & Plan Note (Signed)
 DASH diet and commitment to daily physical activity for a minimum of 30 minutes discussed and encouraged, as a part of hypertension management. The importance of attaining a healthy weight is also discussed.     01/11/2024    4:18 PM 12/02/2023    9:38 AM 12/02/2023    8:56 AM 08/19/2023    3:00 PM 08/19/2023    2:04 PM 08/19/2023    2:01 PM 05/10/2023    2:14 PM  BP/Weight  Systolic BP 127 147 160 148 157 173 170  Diastolic BP 80 86 80 88 114 105 90  Wt. (Lbs) 135.08  140   135   BMI 23.93 kg/m2  24.8 kg/m2   23.91 kg/m2      Controlled at visit, pt incapable of reporting the med she self administers, will narrowmeds down to 2 and have nurse visit to re assess in 1 week

## 2024-01-16 NOTE — Assessment & Plan Note (Signed)
 Though pt agrees that she I forgetful and that se has required assistance from family esp daughter Marit to keep her finances straight as she has been a victim of finanacial scam , she remains reticent in refusing the oversight needed by her daughter and grandson, social work and case management are already involved

## 2024-01-16 NOTE — Assessment & Plan Note (Signed)
 Recent loss of only living Aunt has pt appropriately grieving and her depression is curently uncontrolled, she is not suicidal or homicidal, no interest in therapy, has been  on anti depressant in the past , holding off on this now until med loist can be clarified and update and accurate

## 2024-01-16 NOTE — Assessment & Plan Note (Addendum)
 New dx refer to GI for eval and management asap

## 2024-01-16 NOTE — Assessment & Plan Note (Signed)
Chronic pain management through pain clinic ?

## 2024-01-17 LAB — SPECIMEN STATUS REPORT

## 2024-01-17 LAB — HEPATITIS B CORE AB W/REFLEX: Hep B Core Total Ab: POSITIVE — AB

## 2024-01-17 LAB — HBCIGM

## 2024-01-17 LAB — HEPATITIS C ANTIBODY: Hep C Virus Ab: NONREACTIVE

## 2024-01-18 ENCOUNTER — Telehealth: Payer: Self-pay

## 2024-01-18 NOTE — Progress Notes (Signed)
 Complex Care Management Note  Care Guide Note 01/18/2024 Name: Jonalyn Mease MRN: [id number] DOB: 1933-10-23  Bridey Eavenson is a 89 y.o. year old female who sees Towanda Fret, MD for primary care. I reached out to Thamas Fillers by phone today to offer complex care management services.  Ms. Kissell was given information about Complex Care Management services today including:   The Complex Care Management services include support from the care team which includes your Nurse Care Manager, Clinical Social Worker, or Pharmacist.  The Complex Care Management team is here to help remove barriers to the health concerns and goals most important to you. Complex Care Management services are voluntary, and the patient may decline or stop services at any time by request to their care team member.   Complex Care Management Consent Status: Patient did not agree to participate in complex care management services at this time.  Follow up plan:    Encounter Outcome:  Patient Refused  Lenton Rail , RMA     [facility]  [facility], [facility] Guide  Direct Dial: [phone]  Website: [email or web address]

## 2024-02-02 ENCOUNTER — Other Ambulatory Visit: Payer: Self-pay

## 2024-02-02 DIAGNOSIS — R768 Other specified abnormal immunological findings in serum: Secondary | ICD-10-CM

## 2024-02-02 DIAGNOSIS — R7401 Elevation of levels of liver transaminase levels: Secondary | ICD-10-CM

## 2024-02-07 ENCOUNTER — Ambulatory Visit (INDEPENDENT_AMBULATORY_CARE_PROVIDER_SITE_OTHER): Admitting: Gastroenterology

## 2024-02-22 ENCOUNTER — Ambulatory Visit (INDEPENDENT_AMBULATORY_CARE_PROVIDER_SITE_OTHER): Admitting: Gastroenterology

## 2024-02-22 ENCOUNTER — Telehealth: Payer: Self-pay

## 2024-02-22 ENCOUNTER — Encounter (INDEPENDENT_AMBULATORY_CARE_PROVIDER_SITE_OTHER): Payer: Self-pay | Admitting: Gastroenterology

## 2024-02-22 ENCOUNTER — Encounter: Payer: Self-pay | Admitting: *Deleted

## 2024-02-22 VITALS — BP 101/67 | HR 92 | Temp 97.5°F | Ht 63.0 in | Wt 131.1 lb

## 2024-02-22 DIAGNOSIS — R768 Other specified abnormal immunological findings in serum: Secondary | ICD-10-CM

## 2024-02-22 DIAGNOSIS — R7989 Other specified abnormal findings of blood chemistry: Secondary | ICD-10-CM | POA: Diagnosis not present

## 2024-02-22 DIAGNOSIS — R748 Abnormal levels of other serum enzymes: Secondary | ICD-10-CM | POA: Diagnosis not present

## 2024-02-22 NOTE — Telephone Encounter (Signed)
 Copied from CRM [id number]. Topic: General - Other >> Feb 22, 2024  2:47 PM Tiffany S wrote: Reason for CRM: Need patient DX  [employer]  [id number] Ref # [id number]

## 2024-02-22 NOTE — Progress Notes (Signed)
 [name] , M.D. Gastroenterology & Hepatology [facility] Gastroenterology [address] [place], [place] [address] Primary Care Physician: Towanda Fret, MD [address] [place] [address]  Chief Complaint:  hep B core antibody positive and elevated liver enzymes  History of Present Illness: [name] is a 89 y.o. female with hypothyroidism, hyperlipidemia who presents for evaluation of hep B core antibody positive and elevated liver enzymes  Patient is complain with grandson in the clinic and have the daughter over the phone as well.  Does not have any active GI complaints.  Patient thinks she might have had blood transfusion many years ago.The patient denies having any nausea, vomiting, fever, chills, hematochezia, melena, hematemesis, abdominal distention, abdominal pain, diarrhea, jaundice, pruritus or weight loss.  01/2024 AST 79 ALT 35 alk phos 145 T. bili 0.5 Hemoglobin 12.6 TSH 2.7  Hepatitis C negative hep B core antibody IgM negative hep B core total antibody positive  Past Medical History: Past Medical History:  Diagnosis Date   Anemia    Blood clot associated with vein wall inflammation    hx of in right leg   CAD (coronary artery disease)    Carpal tunnel syndrome on left    Left Hand pain with numbness, new onset    Chronic back pain    Chronic back pain    buldging disc   Constipation    Diarrhea    Difficult intubation    small airway   DJD (degenerative joint disease)    Severe   Gastric ulcer    hx of    GERD (gastroesophageal reflux disease)    takes Omeprazole  daily   Headache(784.0)    occasionally   Hyperlipidemia    takes Lipitor nightly   Hypertension    takes Amlodipine  and Metoprolol  daily   Nocturia    Obesity    Pneumonia    hx of-in 2011   Shortness of breath    with exertion   Sleep apnea    sleep study done 23yrs ago   Thyroid  mass    right    Past Surgical  History: Past Surgical History:  Procedure Laterality Date   ABDOMINAL HYSTERECTOMY     APPENDECTOMY  1987   Arthoscopy left knee  1992   Arthroscopy left shoulder  1999   Back  Surgery  lumbar  1989 /1999   x 2   BACK SURGERY     CARDIAC CATHETERIZATION  05/2010   CARDIAC CATHETERIZATION N/A 09/30/2015   Procedure: Left Heart Cath and Coronary Angiography;  Surgeon: Millicent Ally, MD;  Location: [facility] INVASIVE CV LAB;  Service: Cardiovascular;  Laterality: N/A;   Carpal tunnel release right     x 2   CHOLECYSTECTOMY     COLONOSCOPY     ESOPHAGOGASTRODUODENOSCOPY     LUMBAR FUSION  March 19, 2014   NM MYOCAR PERF WALL MOTION  11/12/2008   no ischemia   PARTIAL HYSTERECTOMY  1972   ROTATOR CUFF REPAIR     right   SPINE SURGERY     THYROIDECTOMY  11/17/2011   Procedure: THYROIDECTOMY;  Surgeon: Lawence Press, MD;  Location: [facility] OR;  Service: ENT;  Laterality: Right;  WITH FROZEN SECTION   TONSILLECTOMY      Family History: Family History  Problem Relation Age of Onset   Anuerysm Mother        died at 56   Stroke Father  98 at death    Anesthesia problems Neg Hx    Hypotension Neg Hx    Malignant hyperthermia Neg Hx    Pseudochol deficiency Neg Hx    Allergic rhinitis Neg Hx    Angioedema Neg Hx    Asthma Neg Hx    Atopy Neg Hx    Eczema Neg Hx    Immunodeficiency Neg Hx    Urticaria Neg Hx     Social History: Social History   Tobacco Use  Smoking Status Former   Current packs/day: 0.00   Types: Cigarettes   Quit date: 09/06/1984   Years since quitting: 39.4   Passive exposure: Past  Smokeless Tobacco Never   Social History   Substance and Sexual Activity  Alcohol Use No   Comment: Casual   Social History   Substance and Sexual Activity  Drug Use No    Allergies: Allergies  Allergen Reactions   Ace Inhibitors Anaphylaxis    Was on ventilator for six days due to airway swelling shut while on ACE inhibitors.   Beef-Derived Drug Products Shortness Of  Breath, Diarrhea and Swelling   Bupropion Hcl Anaphylaxis   Codeine Anaphylaxis   Penicillins Anaphylaxis    Has patient had a PCN reaction causing immediate rash, facial/tongue/throat swelling, SOB or lightheadedness with hypotension: Yes Has patient had a PCN reaction causing severe rash involving mucus membranes or skin necrosis: No Has patient had a PCN reaction that required hospitalization No Has patient had a PCN reaction occurring within the last 10 years: No If all of the above answers are NO, then may proceed with Cephalosporin use.    Angiotensin Receptor Blockers Other (See Comments)    Patient unable to recall what medication this might have been and/or her reaction to it.   Bee Venom     Facial swelling     Lambs Quarters     Allergen testing   Pork-Derived Products     Due to tick bite    Medications: Current Outpatient Medications  Medication Sig Dispense Refill   amLODipine  (NORVASC ) 10 MG tablet TAKE 1 TABLET(10 MG) BY MOUTH DAILY 90 tablet 3   Ascorbic Acid (VITAMIN C) 500 MG tablet Take 500 mg by mouth daily.     aspirin  EC 81 MG tablet Take 81 mg by mouth daily.     calcium  carbonate (OS-CAL - DOSED IN MG OF ELEMENTAL CALCIUM ) 1250 MG tablet Take 1 tablet by mouth daily.     Cyanocobalamin (B-12) 1000 MCG CAPS Take 1 capsule by mouth daily.     diclofenac  sodium (VOLTAREN ) 1 % GEL Apply 2 g topically 2 (two) times daily as needed (for back pain).     EPINEPHrine  0.3 mg/0.3 mL IJ SOAJ injection INJECT INTO THE MUSCLE AS NEEDED 2 each 1   ezetimibe  (ZETIA ) 10 MG tablet TAKE 1 TABLET(10 MG) BY MOUTH DAILY 90 tablet 1   fluticasone  (FLONASE ) 50 MCG/ACT nasal spray SHAKE LIQUID AND USE 1 SPRAY IN EACH NOSTRIL DAILY 16 g 3   hydrochlorothiazide  (HYDRODIURIL ) 25 MG tablet Take 1 tablet (25 mg total) by mouth daily. 30 tablet 5   HYDROcodone -acetaminophen  (NORCO) 10-325 MG tablet Take 1 tablet by mouth every 6 (six) hours as needed for moderate pain.     isosorbide   mononitrate (IMDUR ) 30 MG 24 hr tablet Take 1 tablet (30 mg total) by mouth daily. 90 tablet 3   levothyroxine  (SYNTHROID ) 25 MCG tablet TAKE 1 TABLET BY MOUTH ON MONDAY THROUGH THURSDAY. TAKE  1 1/2 TABLET ON FRIDAY, SATURDAY AND SUNDAY. 110 tablet 3   metoprolol  succinate (TOPROL  XL) 25 MG 24 hr tablet Take 1 tablet (25 mg total) by mouth daily. 90 tablet 3   Multiple Vitamins-Minerals (CENTRUM SILVER 50+WOMEN) TABS Take 1 tablet by mouth.     nitroGLYCERIN  (NITROLINGUAL ) 0.4 MG/SPRAY spray Place 1 spray under the tongue every 5 (five) minutes x 3 doses as needed for chest pain. 12 g 0   potassium gluconate 595 (99 K) MG TABS tablet Take 1 tablet (595 mg total) by mouth every evening. 30 tablet 1   rosuvastatin  (CRESTOR ) 10 MG tablet Take 1 tablet (10 mg total) by mouth daily. 90 tablet 3   sertraline  (ZOLOFT ) 25 MG tablet Take 1 tablet (25 mg total) by mouth daily. 30 tablet 3   No current facility-administered medications for this visit.    Review of Systems: GENERAL: negative for malaise, night sweats HEENT: No changes in hearing or vision, no nose bleeds or other nasal problems. NECK: Negative for lumps, goiter, pain and significant neck swelling RESPIRATORY: Negative for cough, wheezing CARDIOVASCULAR: Negative for chest pain, leg swelling, palpitations, orthopnea GI: SEE HPI MUSCULOSKELETAL: Negative for joint pain or swelling, back pain, and muscle pain. SKIN: Negative for lesions, rash HEMATOLOGY Negative for prolonged bleeding, bruising easily, and swollen nodes. ENDOCRINE: Negative for cold or heat intolerance, polyuria, polydipsia and goiter. NEURO: negative for tremor, gait imbalance, syncope and seizures. The remainder of the review of systems is noncontributory.   Physical Exam: BP 101/67   Pulse 92   Temp (!) 97.5 F (36.4 C)   Ht 5' 3 (1.6 m)   Wt 131 lb 1.6 oz (59.5 kg)   BMI 23.22 kg/m  GENERAL: The patient is AO x3, in no acute distress. HEENT: Head is  normocephalic and atraumatic. EOMI are intact. Mouth is well hydrated and without lesions. NECK: Supple. No masses LUNGS: Clear to auscultation. No presence of rhonchi/wheezing/rales. Adequate chest expansion HEART: RRR, normal s1 and s2. ABDOMEN: Soft, nontender, no guarding, no peritoneal signs, and nondistended. BS +. No masses.  Imaging/Labs: as above     Latest Ref Rng & Units 01/12/2024    1:05 PM 12/10/2022   10:30 AM 07/23/2021   12:14 PM  CBC  WBC 3.4 - 10.8 x10E3/uL 4.0  3.9  5.1   Hemoglobin 11.1 - 15.9 g/dL 96.2  95.2  84.1   Hematocrit 34.0 - 46.6 % 39.8  41.7  39.6   Platelets 150 - 450 x10E3/uL 235  217  240    Lab Results  Component Value Date   IRON 28 (L) 06/06/2010   TIBC 250 06/06/2010   FERRITIN 361 (H) 06/06/2010    I personally reviewed and interpreted the available labs, imaging and endoscopic files.  Impression and Plan:  Dystany Duffy is a 89 y.o. female with hypothyroidism, hyperlipidemia who presents for evaluation of hep B core antibody positive and elevated liver enzymes  # Elevated liver enzymes # Hep B core antibody positive  Mixed pattern elevation liver enzymes   Hepatitis B core antibody could be positive in 4 situations:  -Might be recovering from acute HBV infection; IgM anti-HBc should be positive. -Might have had recovered from prior infection and test not sensitive enough to detect very low level of anti-HBs in serum. -Might be susceptible with a false positive anti-HBc, rare. -Might have occult HBV infection with undetectable Hbs-Ag and generally undetectable or low level serum HBV DNA; such patients may experience HBV  reactivation during potent immunosuppressive therapy.  We will obtain :  Repeat hepatitis B surface antigen  hepatitis B viral load, hepatitis B e antigen and hepatitis B e antibody. HIV Abdominal ultrasound   Hep C is negative   I also spoke with patient daughter [name] over the phone explaining the  test results and what it means , and need for further testing   Alondria Mousseau Faizan Nereyda Bowler, MD Gastroenterology and Hepatology [facility] Gastroenterology   This chart has been completed using [facility] Dictation software, and while attempts have been made to ensure accuracy , certain words and phrases may not be transcribed as intended

## 2024-02-23 NOTE — Telephone Encounter (Signed)
 Done

## 2024-02-24 LAB — HEPATITIS B SURFACE ANTIGEN: Hepatitis B Surface Ag: NEGATIVE

## 2024-02-24 LAB — COMPREHENSIVE METABOLIC PANEL WITH GFR
ALT: 12 IU/L (ref 0–32)
AST: 22 IU/L (ref 0–40)
Albumin: 4.4 g/dL (ref 3.7–4.7)
Alkaline Phosphatase: 89 IU/L (ref 44–121)
BUN/Creatinine Ratio: 13 (ref 12–28)
BUN: 15 mg/dL (ref 8–27)
Bilirubin Total: 0.7 mg/dL (ref 0.0–1.2)
CO2: 22 mmol/L (ref 20–29)
Calcium: 10.1 mg/dL (ref 8.7–10.3)
Chloride: 101 mmol/L (ref 96–106)
Creatinine, Ser: 1.14 mg/dL — ABNORMAL HIGH (ref 0.57–1.00)
Globulin, Total: 2.7 g/dL (ref 1.5–4.5)
Glucose: 94 mg/dL (ref 70–99)
Potassium: 4.4 mmol/L (ref 3.5–5.2)
Sodium: 139 mmol/L (ref 134–144)
Total Protein: 7.1 g/dL (ref 6.0–8.5)
eGFR: 47 mL/min/{1.73_m2} — ABNORMAL LOW (ref >59)

## 2024-02-24 LAB — HEPATITIS B CORE ANTIBODY, TOTAL: Hep B Core Total Ab: POSITIVE — AB

## 2024-02-24 LAB — HEPATITIS B DNA, ULTRAQUANTITATIVE, PCR: HBV DNA SERPL PCR-ACNC: NOT DETECTED [IU]/mL

## 2024-02-24 LAB — HEPATITIS B E ANTIBODY: Hep B E Ab: REACTIVE — AB

## 2024-02-24 LAB — HIV ANTIBODY (ROUTINE TESTING W REFLEX): HIV Screen 4th Generation wRfx: NONREACTIVE

## 2024-02-24 LAB — HEPATITIS B SURFACE ANTIBODY,QUALITATIVE: Hep B Surface Ab, Qual: REACTIVE

## 2024-02-24 LAB — HEPATITIS B E ANTIGEN: Hep B E Ag: NEGATIVE

## 2024-02-24 LAB — HEPATITIS A ANTIBODY, TOTAL: hep A Total Ab: NEGATIVE

## 2024-02-27 ENCOUNTER — Ambulatory Visit (INDEPENDENT_AMBULATORY_CARE_PROVIDER_SITE_OTHER): Payer: Self-pay | Admitting: Gastroenterology

## 2024-02-27 NOTE — Progress Notes (Signed)
 Hi Tanya ,  Can you please call the patient and tell the patient the lab work shows that you were exposed to hepatitis B virus but your body fought it off and you are immune.  Your liver enzymes are also now normal.  All this is good news and nothing to worry about  Dr Antonetta; RICK  Thanks,  [name] Kaitlyn Rayla Pember, MD Gastroenterology and Hepatology [facility] Gastroenterology  =============================  Labs   Hepatitis B core antibody positive Hepatitis B E antibody reactive Hepatitis B E antigen negative Hepatitis B surface antibody reactive Hepatitis B surface antigen negative HIV negative Hepatitis B DNA not detected  Liver enzymes have normalized now Hepatitis A nonimmune  Above suggest that patient was exposed to hepatitis B and now immune.  No further management required for positive hepatitis B core antibody  If patient requires chemotherapy or B-cell depleting agents in future for any reason may recommend prophylaxis against hepatitis B

## 2024-02-29 ENCOUNTER — Ambulatory Visit (HOSPITAL_COMMUNITY)

## 2024-03-22 ENCOUNTER — Ambulatory Visit (HOSPITAL_COMMUNITY)
Admission: RE | Admit: 2024-03-22 | Discharge: 2024-03-22 | Disposition: A | Discharge status: Home / Self Care | Source: Ambulatory Visit | Attending: Gastroenterology | Admitting: Gastroenterology

## 2024-03-22 DIAGNOSIS — R768 Other specified abnormal immunological findings in serum: Secondary | ICD-10-CM | POA: Insufficient documentation

## 2024-03-22 DIAGNOSIS — R748 Abnormal levels of other serum enzymes: Secondary | ICD-10-CM | POA: Diagnosis present

## 2024-03-29 ENCOUNTER — Ambulatory Visit: Payer: Self-pay | Admitting: Family Medicine

## 2024-04-03 ENCOUNTER — Encounter: Payer: Self-pay | Admitting: *Deleted

## 2024-04-03 ENCOUNTER — Encounter (INDEPENDENT_AMBULATORY_CARE_PROVIDER_SITE_OTHER): Payer: Self-pay | Admitting: Gastroenterology

## 2024-04-03 ENCOUNTER — Ambulatory Visit (INDEPENDENT_AMBULATORY_CARE_PROVIDER_SITE_OTHER): Admitting: Gastroenterology

## 2024-04-03 VITALS — BP 136/81 | HR 71 | Temp 97.5°F | Ht 61.0 in | Wt 141.5 lb

## 2024-04-03 DIAGNOSIS — R748 Abnormal levels of other serum enzymes: Secondary | ICD-10-CM

## 2024-04-03 DIAGNOSIS — K838 Other specified diseases of biliary tract: Secondary | ICD-10-CM | POA: Diagnosis not present

## 2024-04-03 DIAGNOSIS — R768 Other specified abnormal immunological findings in serum: Secondary | ICD-10-CM

## 2024-04-03 NOTE — Progress Notes (Signed)
 Sharion Grieves Faizan Deitra Craine , M.D. Gastroenterology & Hepatology [facility] Gastroenterology [address] [place], [place] [address] Primary Care Physician: Antonetta Rollene BRAVO, MD [address] [place] [address]  Chief Complaint:  hep B core antibody positive and elevated liver enzymes, dilated intrahepatic bile duct  History of Present Illness: [name] is a 89 y.o. female with hypothyroidism, hyperlipidemia who presents for evaluation of hep B core antibody positive and elevated liver enzymes  Patient is unaccompanied with grandson in the clinic and have the daughter over the phone as well.  Does not have any active GI complaints.  Patient thinks she might have had blood transfusion many years ago.The patient denies having any nausea, vomiting, fever, chills, hematochezia, melena, hematemesis, abdominal distention, abdominal pain, diarrhea, jaundice, pruritus or weight loss.  01/2024 AST 79 ALT 35 alk phos 145 T. bili 0.5 Hemoglobin 12.6 TSH 2.7  Hepatitis C negative hep B core antibody IgM negative hep B core total antibody positive  Hepatitis B core antibody positive Hepatitis B E antibody reactive Hepatitis B E antigen negative Hepatitis B surface antibody reactive Hepatitis B surface antigen negative HIV negative Hepatitis B DNA not detected  Liver enzymes have normalized now Hepatitis A nonimmune   Past Medical History: Past Medical History:  Diagnosis Date   Anemia    Blood clot associated with vein wall inflammation    hx of in right leg   CAD (coronary artery disease)    Carpal tunnel syndrome on left    Left Hand pain with numbness, new onset    Chronic back pain    Chronic back pain    buldging disc   Constipation    Diarrhea    Difficult intubation    small airway   DJD (degenerative joint disease)    Severe   Gastric ulcer    hx of    GERD (gastroesophageal reflux disease)    takes Omeprazole  daily    Headache(784.0)    occasionally   Hyperlipidemia    takes Lipitor nightly   Hypertension    takes Amlodipine  and Metoprolol  daily   Nocturia    Obesity    Pneumonia    hx of-in 2011   Shortness of breath    with exertion   Sleep apnea    sleep study done 85yrs ago   Thyroid  mass    right    Past Surgical History: Past Surgical History:  Procedure Laterality Date   ABDOMINAL HYSTERECTOMY     APPENDECTOMY  1987   Arthoscopy left knee  1992   Arthroscopy left shoulder  1999   Back  Surgery  lumbar  1989 /1999   x 2   BACK SURGERY     CARDIAC CATHETERIZATION  05/2010   CARDIAC CATHETERIZATION N/A 09/30/2015   Procedure: Left Heart Cath and Coronary Angiography;  Surgeon: Debby DELENA Sor, MD;  [facility];  Service: Cardiovascular;  Laterality: N/A;   Carpal tunnel release right     x 2   CHOLECYSTECTOMY     COLONOSCOPY     ESOPHAGOGASTRODUODENOSCOPY     LUMBAR FUSION  March 19, 2014   NM MYOCAR PERF WALL MOTION  11/12/2008   no ischemia   PARTIAL HYSTERECTOMY  1972   ROTATOR CUFF REPAIR     right   SPINE SURGERY     THYROIDECTOMY  11/17/2011   Procedure: THYROIDECTOMY;  Surgeon: Ana LELON Moccasin, MD;  Location: [facility] OR;  Service: ENT;  Laterality: Right;  WITH FROZEN SECTION   TONSILLECTOMY      Family History: Family History  Problem Relation Age of Onset   Anuerysm Mother        died at 71   Stroke Father        68 at death    Anesthesia problems Neg Hx    Hypotension Neg Hx    Malignant hyperthermia Neg Hx    Pseudochol deficiency Neg Hx    Allergic rhinitis Neg Hx    Angioedema Neg Hx    Asthma Neg Hx    Atopy Neg Hx    Eczema Neg Hx    Immunodeficiency Neg Hx    Urticaria Neg Hx     Social History: Social History   Tobacco Use  Smoking Status Former   Current packs/day: 0.00   Types: Cigarettes   Quit date: 09/06/1984   Years since quitting: 39.6   Passive exposure: Past  Smokeless Tobacco Never   Social History   Substance and Sexual  Activity  Alcohol Use No   Comment: Casual   Social History   Substance and Sexual Activity  Drug Use No    Allergies: Allergies  Allergen Reactions   Ace Inhibitors Anaphylaxis    Was on ventilator for six days due to airway swelling shut while on ACE inhibitors.   Beef-Derived Drug Products Shortness Of Breath, Diarrhea and Swelling   Bupropion Hcl Anaphylaxis   Codeine Anaphylaxis   Penicillins Anaphylaxis    Has patient had a PCN reaction causing immediate rash, facial/tongue/throat swelling, SOB or lightheadedness with hypotension: Yes Has patient had a PCN reaction causing severe rash involving mucus membranes or skin necrosis: No Has patient had a PCN reaction that required hospitalization No Has patient had a PCN reaction occurring within the last 10 years: No If all of the above answers are NO, then may proceed with Cephalosporin use.    Angiotensin Receptor Blockers Other (See Comments)    Patient unable to recall what medication this might have been and/or her reaction to it.   Bee Venom     Facial swelling     Lambs Quarters     Allergen testing   Pork-Derived Products     Due to tick bite    Medications: Current Outpatient Medications  Medication Sig Dispense Refill   amLODipine  (NORVASC ) 10 MG tablet TAKE 1 TABLET(10 MG) BY MOUTH DAILY 90 tablet 3   Ascorbic Acid (VITAMIN C) 500 MG tablet Take 500 mg by mouth daily.     aspirin  EC 81 MG tablet Take 81 mg by mouth daily.     calcium  carbonate (OS-CAL - DOSED IN MG OF ELEMENTAL CALCIUM ) 1250 MG tablet Take 1 tablet by mouth daily.     Cyanocobalamin (B-12) 1000 MCG CAPS Take 1 capsule by mouth daily.     diclofenac  sodium (VOLTAREN ) 1 % GEL Apply 2 g topically 2 (two) times daily as needed (for back pain).     EPINEPHrine  0.3 mg/0.3 mL IJ SOAJ injection INJECT INTO THE MUSCLE AS NEEDED 2 each 1   ezetimibe  (ZETIA ) 10 MG tablet TAKE 1 TABLET(10 MG) BY MOUTH DAILY 90 tablet 1   fluticasone  (FLONASE ) 50  MCG/ACT nasal spray SHAKE LIQUID AND USE 1 SPRAY IN EACH NOSTRIL DAILY 16 g 3   hydrochlorothiazide  (HYDRODIURIL ) 25 MG tablet Take 1 tablet (25 mg total) by mouth daily. 30 tablet 5   HYDROcodone -acetaminophen  (NORCO) 10-325 MG tablet Take 1 tablet  by mouth every 6 (six) hours as needed for moderate pain.     isosorbide  mononitrate (IMDUR ) 30 MG 24 hr tablet Take 1 tablet (30 mg total) by mouth daily. 90 tablet 3   levothyroxine  (SYNTHROID ) 25 MCG tablet TAKE 1 TABLET BY MOUTH ON MONDAY THROUGH THURSDAY. TAKE 1 1/2 TABLET ON FRIDAY, SATURDAY AND SUNDAY. 110 tablet 3   metoprolol  succinate (TOPROL  XL) 25 MG 24 hr tablet Take 1 tablet (25 mg total) by mouth daily. 90 tablet 3   Multiple Vitamins-Minerals (CENTRUM SILVER 50+WOMEN) TABS Take 1 tablet by mouth.     nitroGLYCERIN  (NITROLINGUAL ) 0.4 MG/SPRAY spray Place 1 spray under the tongue every 5 (five) minutes x 3 doses as needed for chest pain. 12 g 0   potassium gluconate 595 (99 K) MG TABS tablet Take 1 tablet (595 mg total) by mouth every evening. 30 tablet 1   rosuvastatin  (CRESTOR ) 10 MG tablet Take 1 tablet (10 mg total) by mouth daily. 90 tablet 3   sertraline  (ZOLOFT ) 25 MG tablet Take 1 tablet (25 mg total) by mouth daily. 30 tablet 3   No current facility-administered medications for this visit.    Review of Systems: GENERAL: negative for malaise, night sweats HEENT: No changes in hearing or vision, no nose bleeds or other nasal problems. NECK: Negative for lumps, goiter, pain and significant neck swelling RESPIRATORY: Negative for cough, wheezing CARDIOVASCULAR: Negative for chest pain, leg swelling, palpitations, orthopnea GI: SEE HPI MUSCULOSKELETAL: Negative for joint pain or swelling, back pain, and muscle pain. SKIN: Negative for lesions, rash HEMATOLOGY Negative for prolonged bleeding, bruising easily, and swollen nodes. ENDOCRINE: Negative for cold or heat intolerance, polyuria, polydipsia and goiter. NEURO: negative for  tremor, gait imbalance, syncope and seizures. The remainder of the review of systems is noncontributory.   Physical Exam: BP 136/81   Pulse 71   Temp (!) 97.5 F (36.4 C)   Ht 5' 1 (1.549 m)   Wt 141 lb 8 oz (64.2 kg)   BMI 26.74 kg/m  GENERAL: The patient is AO x3, in no acute distress. HEENT: Head is normocephalic and atraumatic. EOMI are intact. Mouth is well hydrated and without lesions. NECK: Supple. No masses LUNGS: Clear to auscultation. No presence of rhonchi/wheezing/rales. Adequate chest expansion HEART: RRR, normal s1 and s2. ABDOMEN: Soft, nontender, no guarding, no peritoneal signs, and nondistended. BS +. No masses.  Imaging/Labs: as above     Latest Ref Rng & Units 01/12/2024    1:05 PM 12/10/2022   10:30 AM 07/23/2021   12:14 PM  CBC  WBC 3.4 - 10.8 x10E3/uL 4.0  3.9  5.1   Hemoglobin 11.1 - 15.9 g/dL 87.3  86.7  87.3   Hematocrit 34.0 - 46.6 % 39.8  41.7  39.6   Platelets 150 - 450 x10E3/uL 235  217  240    Lab Results  Component Value Date   IRON 28 (L) 06/06/2010   TIBC 250 06/06/2010   FERRITIN 361 (H) 06/06/2010    I personally reviewed and interpreted the available labs, imaging and endoscopic files.  Hepatitis B core antibody positive Hepatitis B E antibody reactive Hepatitis B E antigen negative Hepatitis B surface antibody reactive Hepatitis B surface antigen negative HIV negative Hepatitis B DNA not detected  Liver enzymes have normalized now Hepatitis A nonimmune  Impression and Plan: Loretha Ure is a 89 y.o. female with hypothyroidism, hyperlipidemia who presents for evaluation of hep B core antibody positive and elevated liver enzymes, intrahepatic  bile duct dilation  # Elevated liver enzymes # Hepatitis B exposed and immune  # Intrahepatic bile duct dilation  Elevated liver enzymes have normalized now Unsure how patient was exposed to hepatitis B  Recent blood work suggest that patient was exposed to hepatitis B and now  immune.  No further management required for positive hepatitis B core antibody  If patient requires chemotherapy or B-cell depleting agents in future for any reason may recommend prophylaxis against hepatitis B ; I reiterated this to the patient and patient daughter  Although abdominal ultrasound incidentally demonstrated intrahepatic bile duct dilation centrally  Given advanced age and elevated liver enzyme we will proceed with MRI abdomen MRCP to evaluate the etiology of this bile duct dilation  Patient has spine instrumentation in 2015 but documented to have MRI performed few times after that most recent 2024  I also spoke with patient daughter Tilton Quan over the phone explaining the test results and what it means , and need for further testing   Lexus Barletta Faizan Akbar Sacra, MD Gastroenterology and Hepatology [facility] Gastroenterology   This chart has been completed using [facility] Dictation software, and while attempts have been made to ensure accuracy , certain words and phrases may not be transcribed as intended

## 2024-04-09 ENCOUNTER — Ambulatory Visit (HOSPITAL_COMMUNITY)
Admission: RE | Admit: 2024-04-09 | Discharge: 2024-04-09 | Disposition: A | Discharge status: Home / Self Care | Source: Ambulatory Visit | Attending: Gastroenterology | Admitting: Gastroenterology

## 2024-04-09 ENCOUNTER — Other Ambulatory Visit (INDEPENDENT_AMBULATORY_CARE_PROVIDER_SITE_OTHER): Payer: Self-pay | Admitting: Gastroenterology

## 2024-04-09 DIAGNOSIS — K5710 Diverticulosis of small intestine without perforation or abscess without bleeding: Secondary | ICD-10-CM | POA: Diagnosis not present

## 2024-04-09 DIAGNOSIS — R748 Abnormal levels of other serum enzymes: Secondary | ICD-10-CM | POA: Diagnosis not present

## 2024-04-09 DIAGNOSIS — K838 Other specified diseases of biliary tract: Secondary | ICD-10-CM

## 2024-04-09 DIAGNOSIS — Z9049 Acquired absence of other specified parts of digestive tract: Secondary | ICD-10-CM | POA: Diagnosis not present

## 2024-04-09 MED ORDER — GADOBUTROL 1 MMOL/ML IV SOLN
6.0000 mL | Freq: Once | INTRAVENOUS | Status: AC | PRN
  Administered 2024-04-09: 6 mL via INTRAVENOUS

## 2024-04-09 NOTE — Telephone Encounter (Signed)
 [employer]: Authorization Number: [id number] Case Number: [id number] Review Date: 04/09/2024 9:09:07 AM Expiration Date: 10/06/2024 Status: Your case has been Approved. The prior authorization you submitted, Case [id number], has been received. Additional case status notifications will be sent if you opted in for email notifications. Thank you.

## 2024-04-17 ENCOUNTER — Ambulatory Visit (INDEPENDENT_AMBULATORY_CARE_PROVIDER_SITE_OTHER): Payer: Self-pay | Admitting: Gastroenterology

## 2024-04-17 NOTE — Progress Notes (Signed)
 Hi Tanya ,  Can you please call the patient daughter and tell the patient daughter the MRI did not show any stones or mass in the bile duct . This is good news. Although it continues to show dilated bile ducts. If you have any symptoms or if liver enzymes are elevated again , may recommend EUS ( endoscopic ultrasound) at that time .   Thanks,  Desani Sprung Faizan Afiya Ferrebee, MD Gastroenterology and Hepatology [facility] Gastroenterology  ===========  IMPRESSION: 1. Dilated extra and central intrahepatic bile ducts with the common duct measuring 9 mm in diameter and tapering to the level of the ampulla. No choledocholithiasis or obstructing mass identified. Findings are favored to reflect post cholecystectomy state. If laboratory values suggest biliary obstruction would suggest further evaluation with EUS/ERCP. 2. Periampullary duodenal diverticulum.    On: 04/14/2024 08:12

## 2024-05-16 ENCOUNTER — Other Ambulatory Visit: Payer: Self-pay | Admitting: Cardiovascular Disease

## 2024-05-16 ENCOUNTER — Other Ambulatory Visit: Payer: Self-pay | Admitting: Family Medicine

## 2024-05-17 DIAGNOSIS — M545 Low back pain, unspecified: Secondary | ICD-10-CM | POA: Diagnosis not present

## 2024-05-17 DIAGNOSIS — M47816 Spondylosis without myelopathy or radiculopathy, lumbar region: Secondary | ICD-10-CM | POA: Diagnosis not present

## 2024-05-17 DIAGNOSIS — Z79891 Long term (current) use of opiate analgesic: Secondary | ICD-10-CM | POA: Diagnosis not present

## 2024-05-17 DIAGNOSIS — G894 Chronic pain syndrome: Secondary | ICD-10-CM | POA: Diagnosis not present

## 2024-06-05 ENCOUNTER — Ambulatory Visit

## 2024-06-11 DIAGNOSIS — Z113 Encounter for screening for infections with a predominantly sexual mode of transmission: Secondary | ICD-10-CM | POA: Diagnosis not present

## 2024-06-13 ENCOUNTER — Encounter (INDEPENDENT_AMBULATORY_CARE_PROVIDER_SITE_OTHER): Payer: Self-pay | Admitting: Gastroenterology

## 2024-06-13 ENCOUNTER — Ambulatory Visit (INDEPENDENT_AMBULATORY_CARE_PROVIDER_SITE_OTHER): Admitting: Gastroenterology

## 2024-06-13 VITALS — BP 117/78 | HR 81 | Temp 97.8°F | Ht 61.0 in | Wt 129.3 lb

## 2024-06-13 DIAGNOSIS — R748 Abnormal levels of other serum enzymes: Secondary | ICD-10-CM

## 2024-06-13 DIAGNOSIS — K838 Other specified diseases of biliary tract: Secondary | ICD-10-CM | POA: Diagnosis not present

## 2024-06-13 DIAGNOSIS — R7689 Other specified abnormal immunological findings in serum: Secondary | ICD-10-CM

## 2024-06-13 NOTE — Progress Notes (Signed)
 [name] , M.D. Gastroenterology & Hepatology [facility] [address] [place], [place] [address] Primary Care Physician: Antonetta Rollene BRAVO, MD [address] [place] [address]  Chief Complaint:  hep B core antibody positive and elevated liver enzymes, dilated intrahepatic bile duct  History of Present Illness: [name] is a 89 y.o. female with hypothyroidism, hyperlipidemia who presents for evaluation of hep B core antibody positive and elevated liver enzymes  Patient is accompanied with grandson in the clinic and have the daughter over the phone as well.  Does not have any active GI complaints.  Patient thinks she might have had blood transfusion many years ago.The patient denies having any nausea, vomiting, fever, chills, hematochezia, melena, hematemesis, abdominal distention, abdominal pain, diarrhea, jaundice, pruritus or weight loss.  01/2024 AST 79 ALT 35 alk phos 145 T. bili 0.5----> normalized liver enzymes AST: 22 ALT :12 Hemoglobin 12.6 TSH 2.7  Hepatitis C negative hep B core antibody IgM negative hep B core total antibody positive  Hepatitis B core antibody positive Hepatitis B E antibody reactive Hepatitis B E antigen negative Hepatitis B surface antibody reactive Hepatitis B surface antigen negative HIV negative Hepatitis B DNA not detected  Liver enzymes have normalized now Hepatitis A nonimmune   Past Medical History: Past Medical History:  Diagnosis Date   Anemia    Blood clot associated with vein wall inflammation    hx of in right leg   CAD (coronary artery disease)    Carpal tunnel syndrome on left    Left Hand pain with numbness, new onset    Chronic back pain    Chronic back pain    buldging disc   Constipation    Diarrhea    Difficult intubation    small airway   DJD (degenerative joint disease)    Severe   Gastric ulcer    hx of    GERD (gastroesophageal  reflux disease)    takes Omeprazole  daily   Headache(784.0)    occasionally   Hyperlipidemia    takes Lipitor nightly   Hypertension    takes Amlodipine  and Metoprolol  daily   Nocturia    Obesity    Pneumonia    hx of-in 2011   Shortness of breath    with exertion   Sleep apnea    sleep study done 5yrs ago   Thyroid  mass    right    Past Surgical History: Past Surgical History:  Procedure Laterality Date   ABDOMINAL HYSTERECTOMY     APPENDECTOMY  1987   Arthoscopy left knee  1992   Arthroscopy left shoulder  1999   Back  Surgery  lumbar  1989 /1999   x 2   BACK SURGERY     CARDIAC CATHETERIZATION  05/2010   CARDIAC CATHETERIZATION N/A 09/30/2015   Procedure: Left Heart Cath and Coronary Angiography;  Surgeon: Debby DELENA Sor, MD;  Location: [facility];  Service: Cardiovascular;  Laterality: N/A;   Carpal tunnel release right     x 2   CHOLECYSTECTOMY     COLONOSCOPY     ESOPHAGOGASTRODUODENOSCOPY     LUMBAR FUSION  March 19, 2014   NM MYOCAR PERF WALL MOTION  11/12/2008   no ischemia   PARTIAL HYSTERECTOMY  1972   ROTATOR CUFF REPAIR     right   SPINE SURGERY     THYROIDECTOMY  11/17/2011   Procedure: THYROIDECTOMY;  Surgeon: Ana ORN  Karis, MD;  Location: [facility] OR;  Service: ENT;  Laterality: Right;  WITH FROZEN SECTION   TONSILLECTOMY      Family History: Family History  Problem Relation Age of Onset   Anuerysm Mother        died at 55   Stroke Father        42 at death    Anesthesia problems Neg Hx    Hypotension Neg Hx    Malignant hyperthermia Neg Hx    Pseudochol deficiency Neg Hx    Allergic rhinitis Neg Hx    Angioedema Neg Hx    Asthma Neg Hx    Atopy Neg Hx    Eczema Neg Hx    Immunodeficiency Neg Hx    Urticaria Neg Hx     Social History: Social History   Tobacco Use  Smoking Status Former   Current packs/day: 0.00   Types: Cigarettes   Quit date: 09/06/1984   Years since quitting: 39.7   Passive exposure: Past  Smokeless Tobacco  Never   Social History   Substance and Sexual Activity  Alcohol Use No   Comment: Casual   Social History   Substance and Sexual Activity  Drug Use No    Allergies: Allergies  Allergen Reactions   Ace Inhibitors Anaphylaxis    Was on ventilator for six days due to airway swelling shut while on ACE inhibitors.   Beef-Derived Drug Products Shortness Of Breath, Diarrhea and Swelling   Bupropion Hcl Anaphylaxis   Codeine Anaphylaxis   Penicillins Anaphylaxis    Has patient had a PCN reaction causing immediate rash, facial/tongue/throat swelling, SOB or lightheadedness with hypotension: Yes Has patient had a PCN reaction causing severe rash involving mucus membranes or skin necrosis: No Has patient had a PCN reaction that required hospitalization No Has patient had a PCN reaction occurring within the last 10 years: No If all of the above answers are NO, then may proceed with Cephalosporin use.    Angiotensin Receptor Blockers Other (See Comments)    Patient unable to recall what medication this might have been and/or her reaction to it.   Bee Venom     Facial swelling     Lambs Quarters     Allergen testing   Pork-Derived Products     Due to tick bite    Medications: Current Outpatient Medications  Medication Sig Dispense Refill   amLODipine  (NORVASC ) 10 MG tablet TAKE 1 TABLET(10 MG) BY MOUTH DAILY 90 tablet 0   Ascorbic Acid (VITAMIN C) 500 MG tablet Take 500 mg by mouth daily.     aspirin  EC 81 MG tablet Take 81 mg by mouth daily.     calcium  carbonate (OS-CAL - DOSED IN MG OF ELEMENTAL CALCIUM ) 1250 MG tablet Take 1 tablet by mouth daily.     Cyanocobalamin (B-12) 1000 MCG CAPS Take 1 capsule by mouth daily.     diclofenac  sodium (VOLTAREN ) 1 % GEL Apply 2 g topically 2 (two) times daily as needed (for back pain).     EPINEPHrine  0.3 mg/0.3 mL IJ SOAJ injection INJECT INTO THE MUSCLE AS NEEDED 2 each 1   ezetimibe  (ZETIA ) 10 MG tablet TAKE 1 TABLET(10 MG) BY MOUTH  DAILY 90 tablet 1   fluticasone  (FLONASE ) 50 MCG/ACT nasal spray SHAKE LIQUID AND USE 1 SPRAY IN EACH NOSTRIL DAILY (Patient taking differently: 2 sprays as needed.) 16 g 3   hydrochlorothiazide  (HYDRODIURIL ) 25 MG tablet Take 1 tablet (25 mg total) by mouth  daily. 30 tablet 5   HYDROcodone -acetaminophen  (NORCO) 10-325 MG tablet Take 1 tablet by mouth every 6 (six) hours as needed for moderate pain.     isosorbide  mononitrate (IMDUR ) 30 MG 24 hr tablet Take 1 tablet (30 mg total) by mouth daily. 90 tablet 3   levothyroxine  (SYNTHROID ) 25 MCG tablet TAKE 1 TABLET BY MOUTH ON MONDAY THROUGH THURSDAY. TAKE 1 1/2 TABLET ON FRIDAY, SATURDAY AND SUNDAY. 110 tablet 3   metoprolol  succinate (TOPROL  XL) 25 MG 24 hr tablet Take 1 tablet (25 mg total) by mouth daily. 90 tablet 3   Multiple Vitamins-Minerals (CENTRUM SILVER 50+WOMEN) TABS Take 1 tablet by mouth.     nitroGLYCERIN  (NITROLINGUAL ) 0.4 MG/SPRAY spray PLACE 1 SPRAY UNDER THE TONGUE EVERY 5 MINUTES FOR 3 DOSES AS NEEDED FOR CHEST PAIN 4.9 g 3   potassium gluconate 595 (99 K) MG TABS tablet Take 1 tablet (595 mg total) by mouth every evening. 30 tablet 1   rosuvastatin  (CRESTOR ) 10 MG tablet Take 1 tablet (10 mg total) by mouth daily. 90 tablet 3   sertraline  (ZOLOFT ) 25 MG tablet Take 1 tablet (25 mg total) by mouth daily. 30 tablet 3   No current facility-administered medications for this visit.    Review of Systems: GENERAL: negative for malaise, night sweats HEENT: No changes in hearing or vision, no nose bleeds or other nasal problems. NECK: Negative for lumps, goiter, pain and significant neck swelling RESPIRATORY: Negative for cough, wheezing CARDIOVASCULAR: Negative for chest pain, leg swelling, palpitations, orthopnea GI: SEE HPI MUSCULOSKELETAL: Negative for joint pain or swelling, back pain, and muscle pain. SKIN: Negative for lesions, rash HEMATOLOGY Negative for prolonged bleeding, bruising easily, and swollen nodes. ENDOCRINE:  Negative for cold or heat intolerance, polyuria, polydipsia and goiter. NEURO: negative for tremor, gait imbalance, syncope and seizures. The remainder of the review of systems is noncontributory.   Physical Exam: BP 117/78 (BP Location: Left Arm, Patient Position: Sitting, Cuff Size: Normal)   Pulse 81   Temp 97.8 F (36.6 C) (Temporal)   Ht 5' 1 (1.549 m)   Wt 129 lb 4.8 oz (58.7 kg)   BMI 24.43 kg/m  GENERAL: The patient is AO x3, in no acute distress. HEENT: Head is normocephalic and atraumatic. EOMI are intact. Mouth is well hydrated and without lesions. NECK: Supple. No masses LUNGS: Clear to auscultation. No presence of rhonchi/wheezing/rales. Adequate chest expansion HEART: RRR, normal s1 and s2. ABDOMEN: Soft, nontender, no guarding, no peritoneal signs, and nondistended. BS +. No masses.  Imaging/Labs: as above     Latest Ref Rng & Units 01/12/2024    1:05 PM 12/10/2022   10:30 AM 07/23/2021   12:14 PM  CBC  WBC 3.4 - 10.8 x10E3/uL 4.0  3.9  5.1   Hemoglobin 11.1 - 15.9 g/dL 87.3  86.7  87.3   Hematocrit 34.0 - 46.6 % 39.8  41.7  39.6   Platelets 150 - 450 x10E3/uL 235  217  240    Lab Results  Component Value Date   IRON 28 (L) 06/06/2010   TIBC 250 06/06/2010   FERRITIN 361 (H) 06/06/2010    I personally reviewed and interpreted the available labs, imaging and endoscopic files.  Hepatitis B core antibody positive Hepatitis B E antibody reactive Hepatitis B E antigen negative Hepatitis B surface antibody reactive Hepatitis B surface antigen negative HIV negative Hepatitis B DNA not detected  Liver enzymes have normalized now Hepatitis A nonimmune   MRCP   MPRESSION:  1. Dilated extra and central intrahepatic bile ducts with the common duct measuring 9 mm in diameter and tapering to the level of the ampulla. No choledocholithiasis or obstructing mass identified. Findings are favored to reflect post cholecystectomy state. If laboratory values  suggest biliary obstruction would suggest further evaluation with EUS/ERCP. 2. Periampullary duodenal diverticulum.  Impression and Plan: [name] is a 89 y.o. female with hypothyroidism, hyperlipidemia who presents for evaluation of hep B core antibody positive and elevated liver enzymes, intrahepatic bile duct dilation  # Elevated liver enzymes # Hepatitis B exposed and immune  # Intrahepatic and extrahepatic  bile duct dilation  Elevated liver enzymes have normalized now Unsure how patient was exposed to hepatitis B  Recent blood work suggest that patient was exposed to hepatitis B and now immune.  No further management required for positive hepatitis B core antibody  If patient requires chemotherapy or B-cell depleting agents in future for any reason may recommend prophylaxis against hepatitis B ; I reiterated this to the patient and patient daughter  ultrasound incidentally demonstrated intrahepatic bile duct dilation centrally which was followed up by MRCP as above demonstrating dilated extra intrahepatic bile duct CBD 9 mm without any mass or choledocholithiasis suggesting postcholecystectomy state  I also spoke with patient daughter [name] over the phone explaining the test results and what it means , and recommendation of further testing with EUS.  After risk-benefit discussion they would like to defer EUS as liver enzymes have normalized and there is no overt mass or stone in the bile duct and patient is symptomatic.  They would like to defer the procedure because of risk of anesthesia.  I again reiterated that even though the imaging does not show any liver pathology any usually EUS is done to rule out any occult malignancy or stone which cannot be ruled out without a EUS.  Again due to risk of anesthesia patient and patient daughter would like to defer this procedure  Recommend patient to have repeat liver enzymes in next few months   Kaitlyn Purdom Faizan Kenyia Wambolt,  MD Gastroenterology and Hepatology [facility] Gastroenterology   This chart has been completed using [facility] Dictation software, and while attempts have been made to ensure accuracy , certain words and phrases may not be transcribed as intended

## 2024-06-14 DIAGNOSIS — M545 Low back pain, unspecified: Secondary | ICD-10-CM | POA: Diagnosis not present

## 2024-06-14 DIAGNOSIS — Z79891 Long term (current) use of opiate analgesic: Secondary | ICD-10-CM | POA: Diagnosis not present

## 2024-06-14 DIAGNOSIS — M47816 Spondylosis without myelopathy or radiculopathy, lumbar region: Secondary | ICD-10-CM | POA: Diagnosis not present

## 2024-06-14 DIAGNOSIS — G894 Chronic pain syndrome: Secondary | ICD-10-CM | POA: Diagnosis not present

## 2024-07-12 DIAGNOSIS — Z79891 Long term (current) use of opiate analgesic: Secondary | ICD-10-CM | POA: Diagnosis not present

## 2024-07-12 DIAGNOSIS — M545 Low back pain, unspecified: Secondary | ICD-10-CM | POA: Diagnosis not present

## 2024-07-12 DIAGNOSIS — G894 Chronic pain syndrome: Secondary | ICD-10-CM | POA: Diagnosis not present

## 2024-07-12 DIAGNOSIS — M47816 Spondylosis without myelopathy or radiculopathy, lumbar region: Secondary | ICD-10-CM | POA: Diagnosis not present

## 2024-08-16 ENCOUNTER — Ambulatory Visit (INDEPENDENT_AMBULATORY_CARE_PROVIDER_SITE_OTHER)

## 2024-08-16 VITALS — Ht 60.0 in | Wt 135.0 lb

## 2024-08-16 DIAGNOSIS — Z0000 Encounter for general adult medical examination without abnormal findings: Secondary | ICD-10-CM | POA: Diagnosis not present

## 2024-08-16 NOTE — Patient Instructions (Signed)
 [name],  Thank you for taking the time for your Medicare Wellness Visit. I appreciate your continued commitment to your health goals. Please review the care plan we discussed, and feel free to reach out if I can assist you further.  Please note that Annual Wellness Visits do not include a physical exam. Some assessments may be limited, especially if the visit was conducted virtually. If needed, we may recommend an in-person follow-up with your provider.  Ongoing Care Seeing your primary care provider every 3 to 6 months helps us  monitor your health and provide consistent, personalized care.   Next office visit with primary care provider: October 11, 2024 at 2:40pm  1 year follow up for Medicare well visit: August 19, 2025 at 3:50pm with medicare wellness nurse in office  Referrals If a referral was made during today's visit and you haven't received any updates within two weeks, please contact the referred provider directly to check on the status.  No referrals placed today  Recommended Screenings:  Health Maintenance  Topic Date Due   Zoster (Shingles) Vaccine (1 of 2) 06/17/1953   Medicare Annual Wellness Visit  01/07/2024   Flu Shot  04/06/2024   COVID-19 Vaccine (5 - 2025-26 season) 05/07/2024   DTaP/Tdap/Td vaccine (4 - Td or Tdap) 05/29/2030   Pneumococcal Vaccine for age over 42  Completed   Osteoporosis screening with Bone Density Scan  Completed   Meningitis B Vaccine  Aged Out       08/16/2024    4:05 PM  Advanced Directives  Does Patient Have a Medical Advance Directive? No  Would patient like information on creating a medical advance directive? Yes (MAU/Ambulatory/Procedural Areas - Information given)    Vision: Annual vision screenings are recommended for early detection of glaucoma, cataracts, and diabetic retinopathy. These exams can also reveal signs of chronic conditions such as diabetes and high blood pressure.  Dental: Annual dental screenings help  detect early signs of oral cancer, gum disease, and other conditions linked to overall health, including heart disease and diabetes.  Please see the attached documents for additional preventive care recommendations.

## 2024-08-16 NOTE — Progress Notes (Signed)
 No voiced or noted concerns at this time  Chief Complaint  Patient presents with   Medicare Wellness     Subjective:   [name] is a 89 y.o. female who presents for a Medicare Annual Wellness Visit.  Visit info / Clinical Intake: Medicare Wellness Visit Type:: Subsequent Annual Wellness Visit Persons participating in visit and providing information:: patient & caregiver Medicare Wellness Visit Mode:: Telephone If telephone:: video declined Since this visit was completed virtually, some vitals may be partially provided or unavailable. Missing vitals are due to the limitations of the virtual format.: Documented vitals are patient reported If Telephone or Video please confirm:: I connected with patient using audio/video enable telemedicine. I verified patient identity with two identifiers, discussed telehealth limitations, and patient agreed to proceed. Patient Location:: home Provider Location:: office Interpreter Needed?: No Pre-visit prep was completed: yes AWV questionnaire completed by patient prior to visit?: no Living arrangements:: (!) lives alone Patient's Overall Health Status Rating: good Typical amount of pain: (!) a lot Does pain affect daily life?: (!) yes (depends on the day and the weather) Are you currently prescribed opioids?: (!) yes  Dietary Habits and Nutritional Risks How many meals a day?: 2 Eats fruit and vegetables daily?: yes Most meals are obtained by: preparing own meals In the last 2 weeks, have you had any of the following?: none Diabetic:: no  Functional Status Activities of Daily Living (to include ambulation/medication): Independent Ambulation: Independent with device- listed below Home Assistive Devices/Equipment: Cane Medication Administration: Independent Home Management (perform basic housework or laundry): Independent Manage your own finances?: yes Primary transportation is: driving Concerns about vision?: no *vision screening  is required for WTM* Concerns about hearing?: no  Fall Screening Falls in the past year?: 1 Number of falls in past year: 0 Was there an injury with Fall?: 0 Fall Risk Category Calculator: 1 Patient Fall Risk Level: Low Fall Risk  Fall Risk Patient at Risk for Falls Due to: History of fall(s); Impaired balance/gait; Impaired mobility Fall risk Follow up: Falls evaluation completed; Education provided; Falls prevention discussed  Home and Transportation Safety: All rugs have non-skid backing?: yes All stairs or steps have railings?: yes Grab bars in the bathtub or shower?: yes Have non-skid surface in bathtub or shower?: yes Good home lighting?: yes Regular seat belt use?: yes Hospital stays in the last year:: no  Cognitive Assessment Difficulty concentrating, remembering, or making decisions? : yes Will 6CIT or Mini Cog be Completed: yes What year is it?: 0 points What month is it?: 0 points Give patient an address phrase to remember (5 components): [address] [place] About what time is it?: 0 points Count backwards from 20 to 1: 0 points Say the months of the year in reverse: 0 points Repeat the address phrase from earlier: 0 points 6 CIT Score: 0 points  Advance Directives (For Healthcare) Does Patient Have a Medical Advance Directive?: No Would patient like information on creating a medical advance directive?: Yes (MAU/Ambulatory/Procedural Areas - Information given)  Reviewed/Updated  Reviewed/Updated: Reviewed All (Medical, Surgical, Family, Medications, Allergies, Care Teams, Patient Goals)    Allergies (verified) Ace inhibitors, Bovine (beef) protein-containing drug products, Bupropion hcl, Codeine, Penicillins, Angiotensin receptor blockers, Bee venom, Lambs quarters, and Porcine (pork) protein-containing drug products   Current Medications (verified) Outpatient Encounter Medications as of 08/16/2024  Medication Sig   amLODipine  (NORVASC ) 10 MG tablet  TAKE 1 TABLET(10 MG) BY MOUTH DAILY   Ascorbic Acid (VITAMIN C) 500 MG  tablet Take 500 mg by mouth daily.   aspirin  EC 81 MG tablet Take 81 mg by mouth daily.   calcium  carbonate (OS-CAL - DOSED IN MG OF ELEMENTAL CALCIUM ) 1250 MG tablet Take 1 tablet by mouth daily.   Cyanocobalamin  (B-12) 1000 MCG CAPS Take 1 capsule by mouth daily.   diclofenac  sodium (VOLTAREN ) 1 % GEL Apply 2 g topically 2 (two) times daily as needed (for back pain).   EPINEPHrine  0.3 mg/0.3 mL IJ SOAJ injection INJECT INTO THE MUSCLE AS NEEDED   ezetimibe  (ZETIA ) 10 MG tablet TAKE 1 TABLET(10 MG) BY MOUTH DAILY   fluticasone  (FLONASE ) 50 MCG/ACT nasal spray SHAKE LIQUID AND USE 1 SPRAY IN EACH NOSTRIL DAILY (Patient taking differently: 2 sprays as needed.)   hydrochlorothiazide  (HYDRODIURIL ) 25 MG tablet Take 1 tablet (25 mg total) by mouth daily.   HYDROcodone -acetaminophen  (NORCO) 10-325 MG tablet Take 1 tablet by mouth every 6 (six) hours as needed for moderate pain.   isosorbide  mononitrate (IMDUR ) 30 MG 24 hr tablet Take 1 tablet (30 mg total) by mouth daily.   levothyroxine  (SYNTHROID ) 25 MCG tablet TAKE 1 TABLET BY MOUTH ON MONDAY THROUGH THURSDAY. TAKE 1 1/2 TABLET ON FRIDAY, SATURDAY AND SUNDAY.   metoprolol  succinate (TOPROL  XL) 25 MG 24 hr tablet Take 1 tablet (25 mg total) by mouth daily.   Multiple Vitamins-Minerals (CENTRUM SILVER 50+WOMEN) TABS Take 1 tablet by mouth.   nitroGLYCERIN  (NITROLINGUAL ) 0.4 MG/SPRAY spray PLACE 1 SPRAY UNDER THE TONGUE EVERY 5 MINUTES FOR 3 DOSES AS NEEDED FOR CHEST PAIN   potassium gluconate 595 (99 K) MG TABS tablet Take 1 tablet (595 mg total) by mouth every evening.   rosuvastatin  (CRESTOR ) 10 MG tablet Take 1 tablet (10 mg total) by mouth daily.   sertraline  (ZOLOFT ) 25 MG tablet Take 1 tablet (25 mg total) by mouth daily.   No facility-administered encounter medications on file as of 08/16/2024.    History: Past Medical History:  Diagnosis Date   Anemia    Blood clot  associated with vein wall inflammation    hx of in right leg   CAD (coronary artery disease)    Carpal tunnel syndrome on left    Left Hand pain with numbness, new onset    Chronic back pain    Chronic back pain    buldging disc   Constipation    Diarrhea    Difficult intubation    small airway   DJD (degenerative joint disease)    Severe   Gastric ulcer    hx of    GERD (gastroesophageal reflux disease)    takes Omeprazole  daily   Headache(784.0)    occasionally   Hyperlipidemia    takes Lipitor nightly   Hypertension    takes Amlodipine  and Metoprolol  daily   Nocturia    Obesity    Pneumonia    hx of-in 2011   Shortness of breath    with exertion   Sleep apnea    sleep study done 78yrs ago   Thyroid  mass    right   Past Surgical History:  Procedure Laterality Date   ABDOMINAL HYSTERECTOMY     APPENDECTOMY  1987   Arthoscopy left knee  1992   Arthroscopy left shoulder  1999   Back  Surgery  lumbar  1989 /1999   x 2   BACK SURGERY     CARDIAC CATHETERIZATION  05/2010   CARDIAC CATHETERIZATION N/A 09/30/2015   Procedure: Left Heart Cath and Coronary  Angiography;  Surgeon: Debby DELENA Sor, MD;  Location: [facility] INVASIVE CV LAB;  Service: Cardiovascular;  Laterality: N/A;   Carpal tunnel release right     x 2   CHOLECYSTECTOMY     COLONOSCOPY     ESOPHAGOGASTRODUODENOSCOPY     LUMBAR FUSION  March 19, 2014   NM MYOCAR PERF WALL MOTION  11/12/2008   no ischemia   PARTIAL HYSTERECTOMY  1972   ROTATOR CUFF REPAIR     right   SPINE SURGERY     THYROIDECTOMY  11/17/2011   Procedure: THYROIDECTOMY;  Surgeon: Ana LELON Moccasin, MD;  Location: [facility] OR;  Service: ENT;  Laterality: Right;  WITH FROZEN SECTION   TONSILLECTOMY     Family History  Problem Relation Age of Onset   Anuerysm Mother        died at 14   Stroke Father        25 at death    Anesthesia problems Neg Hx    Hypotension Neg Hx    Malignant hyperthermia Neg Hx    Pseudochol deficiency Neg Hx    Allergic  rhinitis Neg Hx    Angioedema Neg Hx    Asthma Neg Hx    Atopy Neg Hx    Eczema Neg Hx    Immunodeficiency Neg Hx    Urticaria Neg Hx    Social History   Occupational History    Employer: RETIRED  Tobacco Use   Smoking status: Former    Current packs/day: 0.00    Types: Cigarettes    Quit date: 09/06/1984    Years since quitting: 39.9    Passive exposure: Past   Smokeless tobacco: Never  Vaping Use   Vaping status: Never Used  Substance and Sexual Activity   Alcohol use: No    Comment: Casual   Drug use: No   Sexual activity: Not Currently    Partners: Male    Birth control/protection: Surgical   Tobacco Counseling Counseling given: Yes  SDOH Screenings   Food Insecurity: No Food Insecurity (08/16/2024)  Housing: Low Risk (08/16/2024)  Transportation Needs: No Transportation Needs (08/16/2024)  Utilities: Not At Risk (08/16/2024)  Alcohol Screen: Low Risk (08/12/2023)  Depression (PHQ2-9): Low Risk (08/16/2024)  Financial Resource Strain: Low Risk (08/12/2023)  Physical Activity: Sufficiently Active (08/16/2024)  Social Connections: Unknown (08/16/2024)  Stress: No Stress Concern Present (08/16/2024)  Tobacco Use: Medium Risk (08/16/2024)  Health Literacy: Adequate Health Literacy (08/16/2024)   See flowsheets for full screening details  Depression Screen PHQ 2 & 9 Depression Scale- Over the past 2 weeks, how often have you been bothered by any of the following problems? Little interest or pleasure in doing things: 0 Feeling down, depressed, or hopeless (PHQ Adolescent also includes...irritable): 0 PHQ-2 Total Score: 0 Trouble falling or staying asleep, or sleeping too much: 0 Feeling tired or having little energy: 0 Poor appetite or overeating (PHQ Adolescent also includes...weight loss): 0 Feeling bad about yourself - or that you are a failure or have let yourself or your family down: 0 Trouble concentrating on things, such as reading the newspaper or watching  television (PHQ Adolescent also includes...like school work): 3 Moving or speaking so slowly that other people could have noticed. Or the opposite - being so fidgety or restless that you have been moving around a lot more than usual: 0 Thoughts that you would be better off dead, or of hurting yourself in some way: 0 PHQ-9 Total Score: 3 If you checked off  any problems, how difficult have these problems made it for you to do your work, take care of things at home, or get along with other people?: Not difficult at all  Depression Treatment Depression Interventions/Treatment : EYV7-0 Score <4 Follow-up Not Indicated     Goals Addressed               This Visit's Progress     I want things to remain the same. (pt-stated)        Patient Stated               Objective:    Today's Vitals   08/16/24 1602  Weight: 135 lb (61.2 kg)  Height: 5' (1.524 m)   Body mass index is 26.37 kg/m.  Hearing/Vision screen Hearing Screening - Comments:: Patient denies any hearing difficulties.   Vision Screening - Comments:: Patient is not up to date on yearly eye exams. Patient's family will call to schedule and appointment with Dr. Octavia who patient has previously seen Immunizations and Health Maintenance Health Maintenance  Topic Date Due   Zoster Vaccines- Shingrix (1 of 2) 06/17/1953   Medicare Annual Wellness (AWV)  01/07/2024   Influenza Vaccine  04/06/2024   COVID-19 Vaccine (5 - 2025-26 season) 05/07/2024   DTaP/Tdap/Td (4 - Td or Tdap) 05/29/2030   Pneumococcal Vaccine: 50+ Years  Completed   Bone Density Scan  Completed   Meningococcal B Vaccine  Aged Out        Assessment/Plan:  This is a routine wellness examination for Ilah.  Patient Care Team: Antonetta Rollene BRAVO, MD as PCP - General (Family Medicine) Shaaron, Lamar HERO, MD as Attending Physician (Gastroenterology) Croitoru, Jerel, MD as Consulting Physician (Cardiology) Karis Clunes, MD as Consulting Physician  (Otolaryngology) Heide Ingle, MD as Consulting Physician (Orthopedic Surgery) Ahmed, Deatrice FALCON, MD as Consulting Physician (Gastroenterology)  I have personally reviewed and noted the following in the patients chart:   Medical and social history Use of alcohol, tobacco or illicit drugs  Current medications and supplements including opioid prescriptions. Functional ability and status Nutritional status Physical activity Advanced directives List of other physicians Hospitalizations, surgeries, and ER visits in previous 12 months Vitals Screenings to include cognitive, depression, and falls Referrals and appointments  No orders of the defined types were placed in this encounter.  In addition, I have reviewed and discussed with patient certain preventive protocols, quality metrics, and best practice recommendations. A written personalized care plan for preventive services as well as general preventive health recommendations were provided to patient.   Laporshia Hogen, CMA   08/16/2024   Return August 19, 2025 at 3:50pm, for your yearly Medicare Wellness Visit in person.  After Visit Summary: (MyChart) Due to this being a telephonic visit, the after visit summary with patients personalized plan was offered to patient via MyChart

## 2024-08-21 DIAGNOSIS — Z79891 Long term (current) use of opiate analgesic: Secondary | ICD-10-CM | POA: Diagnosis not present

## 2024-08-21 DIAGNOSIS — G894 Chronic pain syndrome: Secondary | ICD-10-CM | POA: Diagnosis not present

## 2024-08-21 DIAGNOSIS — M47816 Spondylosis without myelopathy or radiculopathy, lumbar region: Secondary | ICD-10-CM | POA: Diagnosis not present

## 2024-08-21 DIAGNOSIS — M545 Low back pain, unspecified: Secondary | ICD-10-CM | POA: Diagnosis not present

## 2024-08-28 ENCOUNTER — Other Ambulatory Visit: Payer: Self-pay | Admitting: Family Medicine

## 2024-10-11 ENCOUNTER — Ambulatory Visit: Payer: Self-pay | Admitting: Family Medicine

## 2024-11-22 ENCOUNTER — Ambulatory Visit: Admitting: Family Medicine

## 2025-08-19 ENCOUNTER — Ambulatory Visit: Payer: Self-pay
